# Patient Record
Sex: Male | Born: 1950 | Race: White | Hispanic: No | Marital: Married | Smoking: Never smoker
Health system: Southern US, Community
[De-identification: ages and names within clinical notes are randomized; demographics above are authoritative.]

## PROBLEM LIST (undated history)

## (undated) DIAGNOSIS — H9190 Unspecified hearing loss, unspecified ear: Secondary | ICD-10-CM

## (undated) DIAGNOSIS — I639 Cerebral infarction, unspecified: Secondary | ICD-10-CM

## (undated) DIAGNOSIS — G4730 Sleep apnea, unspecified: Secondary | ICD-10-CM

## (undated) DIAGNOSIS — K219 Gastro-esophageal reflux disease without esophagitis: Secondary | ICD-10-CM

## (undated) DIAGNOSIS — E119 Type 2 diabetes mellitus without complications: Secondary | ICD-10-CM

## (undated) DIAGNOSIS — L570 Actinic keratosis: Secondary | ICD-10-CM

## (undated) DIAGNOSIS — M1990 Unspecified osteoarthritis, unspecified site: Secondary | ICD-10-CM

## (undated) DIAGNOSIS — M1711 Unilateral primary osteoarthritis, right knee: Principal | ICD-10-CM

## (undated) DIAGNOSIS — Z972 Presence of dental prosthetic device (complete) (partial): Secondary | ICD-10-CM

## (undated) DIAGNOSIS — I10 Essential (primary) hypertension: Secondary | ICD-10-CM

## (undated) DIAGNOSIS — E785 Hyperlipidemia, unspecified: Secondary | ICD-10-CM

## (undated) DIAGNOSIS — K589 Irritable bowel syndrome without diarrhea: Secondary | ICD-10-CM

## (undated) DIAGNOSIS — I951 Orthostatic hypotension: Secondary | ICD-10-CM

## (undated) HISTORY — PX: LASIK: SHX215

## (undated) HISTORY — PX: BACK SURGERY: SHX140

## (undated) HISTORY — PX: APPENDECTOMY: SHX54

## (undated) HISTORY — PX: JOINT REPLACEMENT: SHX530

## (undated) HISTORY — PX: TONSILLECTOMY: SUR1361

## (undated) HISTORY — PX: ROTATOR CUFF REPAIR: SHX139

## (undated) HISTORY — DX: Actinic keratosis: L57.0

---

## 2006-05-14 ENCOUNTER — Ambulatory Visit: Payer: Self-pay | Admitting: Internal Medicine

## 2006-06-18 ENCOUNTER — Ambulatory Visit: Payer: Self-pay | Admitting: Internal Medicine

## 2007-10-17 ENCOUNTER — Emergency Department: Payer: Self-pay | Admitting: Emergency Medicine

## 2010-01-20 DIAGNOSIS — C4492 Squamous cell carcinoma of skin, unspecified: Secondary | ICD-10-CM

## 2010-01-20 HISTORY — DX: Squamous cell carcinoma of skin, unspecified: C44.92

## 2012-12-13 ENCOUNTER — Ambulatory Visit: Payer: Self-pay | Admitting: Unknown Physician Specialty

## 2012-12-16 LAB — PATHOLOGY REPORT

## 2013-05-02 ENCOUNTER — Ambulatory Visit: Payer: Self-pay | Admitting: Unknown Physician Specialty

## 2013-05-02 ENCOUNTER — Other Ambulatory Visit: Payer: Self-pay | Admitting: Internal Medicine

## 2013-05-02 LAB — APTT: ACTIVATED PTT: 29.1 s (ref 23.6–35.9)

## 2013-08-15 ENCOUNTER — Ambulatory Visit: Payer: Self-pay | Admitting: Unknown Physician Specialty

## 2013-10-15 DIAGNOSIS — I10 Essential (primary) hypertension: Secondary | ICD-10-CM | POA: Insufficient documentation

## 2014-06-15 DIAGNOSIS — T84019A Broken internal joint prosthesis, unspecified site, initial encounter: Secondary | ICD-10-CM | POA: Insufficient documentation

## 2014-07-08 ENCOUNTER — Other Ambulatory Visit: Payer: Self-pay

## 2014-07-22 ENCOUNTER — Inpatient Hospital Stay: Admission: RE | Admit: 2014-07-22 | Payer: Self-pay | Source: Ambulatory Visit | Admitting: Unknown Physician Specialty

## 2014-07-22 ENCOUNTER — Encounter: Admission: RE | Payer: Self-pay | Source: Ambulatory Visit

## 2014-07-22 SURGERY — TOTAL KNEE ARTHROPLASTY WITH REVISION COMPONENTS
Anesthesia: Choice | Laterality: Left

## 2015-03-04 ENCOUNTER — Other Ambulatory Visit: Payer: Self-pay | Admitting: Unknown Physician Specialty

## 2015-03-04 DIAGNOSIS — H903 Sensorineural hearing loss, bilateral: Secondary | ICD-10-CM

## 2015-03-04 DIAGNOSIS — H905 Unspecified sensorineural hearing loss: Secondary | ICD-10-CM

## 2015-03-24 ENCOUNTER — Ambulatory Visit: Payer: Self-pay

## 2015-03-25 ENCOUNTER — Ambulatory Visit
Admission: RE | Admit: 2015-03-25 | Discharge: 2015-03-25 | Disposition: A | Discharge status: Home / Self Care | Payer: Medicare Other | Source: Ambulatory Visit | Attending: Unknown Physician Specialty | Admitting: Unknown Physician Specialty

## 2015-03-25 DIAGNOSIS — H9193 Unspecified hearing loss, bilateral: Secondary | ICD-10-CM | POA: Diagnosis not present

## 2015-03-25 DIAGNOSIS — H9192 Unspecified hearing loss, left ear: Secondary | ICD-10-CM | POA: Diagnosis present

## 2015-03-25 DIAGNOSIS — H905 Unspecified sensorineural hearing loss: Secondary | ICD-10-CM

## 2015-03-25 DIAGNOSIS — H903 Sensorineural hearing loss, bilateral: Secondary | ICD-10-CM

## 2015-03-25 LAB — POCT I-STAT CREATININE: Creatinine, Ser: 1 mg/dL (ref 0.61–1.24)

## 2015-03-25 MED ORDER — GADOBENATE DIMEGLUMINE 529 MG/ML IV SOLN
20.0000 mL | Freq: Once | INTRAVENOUS | Status: AC | PRN
  Administered 2015-03-25: 20 mL via INTRAVENOUS

## 2016-03-03 ENCOUNTER — Ambulatory Visit
Admission: RE | Admit: 2016-03-03 | Discharge: 2016-03-03 | Disposition: A | Discharge status: Home / Self Care | Payer: Medicare Other | Source: Ambulatory Visit | Attending: Internal Medicine | Admitting: Internal Medicine

## 2016-03-03 ENCOUNTER — Other Ambulatory Visit: Payer: Self-pay | Admitting: Internal Medicine

## 2016-03-03 DIAGNOSIS — R0609 Other forms of dyspnea: Secondary | ICD-10-CM | POA: Insufficient documentation

## 2016-03-03 DIAGNOSIS — N62 Hypertrophy of breast: Secondary | ICD-10-CM | POA: Insufficient documentation

## 2016-03-03 DIAGNOSIS — J9811 Atelectasis: Secondary | ICD-10-CM | POA: Insufficient documentation

## 2016-03-03 DIAGNOSIS — M47814 Spondylosis without myelopathy or radiculopathy, thoracic region: Secondary | ICD-10-CM | POA: Insufficient documentation

## 2016-03-03 DIAGNOSIS — R0600 Dyspnea, unspecified: Secondary | ICD-10-CM

## 2016-03-03 DIAGNOSIS — I2510 Atherosclerotic heart disease of native coronary artery without angina pectoris: Secondary | ICD-10-CM | POA: Insufficient documentation

## 2016-03-03 DIAGNOSIS — K753 Granulomatous hepatitis, not elsewhere classified: Secondary | ICD-10-CM | POA: Diagnosis not present

## 2016-03-03 HISTORY — DX: Type 2 diabetes mellitus without complications: E11.9

## 2016-03-03 LAB — POCT I-STAT CREATININE: Creatinine, Ser: 0.9 mg/dL (ref 0.61–1.24)

## 2016-03-03 MED ORDER — IOPAMIDOL (ISOVUE-370) INJECTION 76%
75.0000 mL | Freq: Once | INTRAVENOUS | Status: AC | PRN
  Administered 2016-03-03: 75 mL via INTRAVENOUS

## 2016-10-30 ENCOUNTER — Observation Stay
Admission: EM | Admit: 2016-10-30 | Discharge: 2016-10-31 | Disposition: A | Discharge status: Home / Self Care | Payer: Medicare Other | Attending: Specialist | Admitting: Specialist

## 2016-10-30 ENCOUNTER — Encounter: Payer: Self-pay | Admitting: Emergency Medicine

## 2016-10-30 DIAGNOSIS — I10 Essential (primary) hypertension: Secondary | ICD-10-CM

## 2016-10-30 DIAGNOSIS — E1169 Type 2 diabetes mellitus with other specified complication: Secondary | ICD-10-CM

## 2016-10-30 DIAGNOSIS — T63461A Toxic effect of venom of wasps, accidental (unintentional), initial encounter: Secondary | ICD-10-CM | POA: Diagnosis not present

## 2016-10-30 DIAGNOSIS — H409 Unspecified glaucoma: Secondary | ICD-10-CM | POA: Diagnosis not present

## 2016-10-30 DIAGNOSIS — E119 Type 2 diabetes mellitus without complications: Secondary | ICD-10-CM | POA: Diagnosis not present

## 2016-10-30 DIAGNOSIS — E785 Hyperlipidemia, unspecified: Secondary | ICD-10-CM | POA: Diagnosis not present

## 2016-10-30 DIAGNOSIS — M79605 Pain in left leg: Secondary | ICD-10-CM | POA: Diagnosis not present

## 2016-10-30 DIAGNOSIS — Z888 Allergy status to other drugs, medicaments and biological substances status: Secondary | ICD-10-CM | POA: Insufficient documentation

## 2016-10-30 DIAGNOSIS — W57XXXA Bitten or stung by nonvenomous insect and other nonvenomous arthropods, initial encounter: Secondary | ICD-10-CM

## 2016-10-30 DIAGNOSIS — M79604 Pain in right leg: Secondary | ICD-10-CM | POA: Insufficient documentation

## 2016-10-30 DIAGNOSIS — Y9289 Other specified places as the place of occurrence of the external cause: Secondary | ICD-10-CM | POA: Diagnosis not present

## 2016-10-30 HISTORY — DX: Essential (primary) hypertension: I10

## 2016-10-30 HISTORY — DX: Hyperlipidemia, unspecified: E78.5

## 2016-10-30 LAB — CBC WITH DIFFERENTIAL/PLATELET
BASOS PCT: 1 %
Basophils Absolute: 0.1 10*3/uL (ref 0–0.1)
EOS ABS: 0 10*3/uL (ref 0–0.7)
EOS PCT: 0 %
HCT: 41.9 % (ref 40.0–52.0)
HEMOGLOBIN: 14.2 g/dL (ref 13.0–18.0)
Lymphocytes Relative: 7 %
Lymphs Abs: 1 10*3/uL (ref 1.0–3.6)
MCH: 29.3 pg (ref 26.0–34.0)
MCHC: 33.9 g/dL (ref 32.0–36.0)
MCV: 86.4 fL (ref 80.0–100.0)
Monocytes Absolute: 0.3 10*3/uL (ref 0.2–1.0)
Monocytes Relative: 2 %
Neutro Abs: 12.4 10*3/uL — ABNORMAL HIGH (ref 1.4–6.5)
Neutrophils Relative %: 90 %
PLATELETS: 240 10*3/uL (ref 150–440)
RBC: 4.85 MIL/uL (ref 4.40–5.90)
RDW: 13.7 % (ref 11.5–14.5)
WBC: 13.7 10*3/uL — AB (ref 3.8–10.6)

## 2016-10-30 LAB — BASIC METABOLIC PANEL
Anion gap: 9 (ref 5–15)
BUN: 15 mg/dL (ref 6–20)
CALCIUM: 9.2 mg/dL (ref 8.9–10.3)
CO2: 22 mmol/L (ref 22–32)
CREATININE: 0.95 mg/dL (ref 0.61–1.24)
Chloride: 105 mmol/L (ref 101–111)
GFR calc non Af Amer: >60 mL/min (ref >60)
Glucose, Bld: 314 mg/dL — ABNORMAL HIGH (ref 65–99)
Potassium: 5 mmol/L (ref 3.5–5.1)
SODIUM: 136 mmol/L (ref 135–145)

## 2016-10-30 MED ORDER — FAMOTIDINE IN NACL 20-0.9 MG/50ML-% IV SOLN
INTRAVENOUS | Status: AC
  Administered 2016-10-30: 20 mg via INTRAVENOUS
  Filled 2016-10-30: qty 50

## 2016-10-30 MED ORDER — HYDROMORPHONE HCL 1 MG/ML IJ SOLN
0.5000 mg | Freq: Once | INTRAMUSCULAR | Status: AC
  Administered 2016-10-30: 0.5 mg via INTRAVENOUS
  Filled 2016-10-30: qty 1

## 2016-10-30 MED ORDER — EPINEPHRINE 0.3 MG/0.3ML IJ SOAJ
INTRAMUSCULAR | Status: AC
  Filled 2016-10-30: qty 0.3

## 2016-10-30 MED ORDER — ONDANSETRON HCL 4 MG/2ML IJ SOLN
INTRAMUSCULAR | Status: AC
  Filled 2016-10-30: qty 2

## 2016-10-30 MED ORDER — DIPHENHYDRAMINE HCL 50 MG/ML IJ SOLN
INTRAMUSCULAR | Status: AC
  Filled 2016-10-30: qty 1

## 2016-10-30 MED ORDER — KETOROLAC TROMETHAMINE 30 MG/ML IJ SOLN
INTRAMUSCULAR | Status: AC
  Administered 2016-10-30: 30 mg
  Filled 2016-10-30: qty 1

## 2016-10-30 MED ORDER — FAMOTIDINE IN NACL 20-0.9 MG/50ML-% IV SOLN
20.0000 mg | Freq: Once | INTRAVENOUS | Status: AC
  Administered 2016-10-30: 20 mg via INTRAVENOUS

## 2016-10-30 MED ORDER — HYDROMORPHONE HCL 1 MG/ML IJ SOLN
0.5000 mg | Freq: Once | INTRAMUSCULAR | Status: AC
  Administered 2016-10-30: 0.5 mg via INTRAVENOUS

## 2016-10-30 MED ORDER — HYDROMORPHONE HCL 1 MG/ML IJ SOLN
1.0000 mg | Freq: Once | INTRAMUSCULAR | Status: AC
  Administered 2016-10-30: 0.5 mg via INTRAVENOUS
  Filled 2016-10-30: qty 1

## 2016-10-30 MED ORDER — DIPHENHYDRAMINE HCL 50 MG/ML IJ SOLN
25.0000 mg | Freq: Once | INTRAMUSCULAR | Status: AC
  Administered 2016-10-30: 25 mg via INTRAVENOUS

## 2016-10-30 MED ORDER — ONDANSETRON HCL 4 MG/2ML IJ SOLN
4.0000 mg | Freq: Once | INTRAMUSCULAR | Status: AC
  Administered 2016-10-30: 4 mg via INTRAVENOUS

## 2016-10-30 MED ORDER — HYDROMORPHONE HCL 1 MG/ML IJ SOLN
INTRAMUSCULAR | Status: AC
  Administered 2016-10-30: 0.5 mg via INTRAVENOUS
  Filled 2016-10-30: qty 1

## 2016-10-30 MED ORDER — METHYLPREDNISOLONE SODIUM SUCC 125 MG IJ SOLR
INTRAMUSCULAR | Status: AC
  Administered 2016-10-30: 125 mg
  Filled 2016-10-30: qty 2

## 2016-10-30 MED ORDER — SODIUM CHLORIDE 0.9 % IV BOLUS (SEPSIS)
1000.0000 mL | Freq: Once | INTRAVENOUS | Status: AC
  Administered 2016-10-30: 1000 mL via INTRAVENOUS

## 2016-10-30 MED ORDER — SODIUM CHLORIDE 0.9 % IV BOLUS (SEPSIS)
1000.0000 mL | Freq: Once | INTRAVENOUS | Status: AC
Start: 2016-10-30 — End: 2016-10-30
  Administered 2016-10-30: 1000 mL via INTRAVENOUS

## 2016-10-30 NOTE — ED Provider Notes (Signed)
[facility] Emergency Department Provider Note ____________________________________________   First MD Initiated Contact with Patient 10/30/16 1949     (approximate)  I have reviewed the triage vital signs and the nursing notes.   HISTORY  Chief Complaint Insect Bite    HPI Vernon Payne is a 66 y.o. male With history of diabetes who presents with diffuse pain and itching after being stung by wasps, approximately 2 hours prior to arrival, when he was mowing the lawn was stung proximally 15 times by yellow jackets.  Patient states the pain is associated with anxiety but states that he has no difficulty breathing, no difficulty swallowing, and does not feel tightness or swelling in his throat, mouth or tongue.  Symptoms slightly improved after being given antihistamine by his wife.    Past Medical History:  Diagnosis Date  . Diabetes mellitus without complication ([place])     There are no active problems to display for this patient.   History reviewed. No pertinent surgical history.  Prior to Admission medications   Not on File    Allergies Morphine and related  History reviewed. No pertinent family history.  Social History Social History  Substance Use Topics  . Smoking status: Never Smoker  . Smokeless tobacco: Never Used  . Alcohol use No    Review of Systems  Constitutional: No fever Eyes: No redness or tearing. ENT: No tightness in the throat.  Cardiovascular: Denies chest pain. Respiratory: Denies shortness of breath. Gastrointestinal: No nausea, no vomiting.    Genitourinary: Negative for dysuria.  Musculoskeletal: Negative for back pain. Skin: Negative for rash, positive for itching.  Neurological: Negative for headaches, focal weakness or numbness.   ____________________________________________   PHYSICAL EXAM:  VITAL SIGNS: ED Triage Vitals  Enc Vitals Group     BP 10/30/16 1951 (!) 187/114     Pulse Rate 10/30/16  1947 (!) 108     Resp 10/30/16 1947 (!) 22     Temp 10/30/16 1947 98.7 F (37.1 C)     Temp Source 10/30/16 1947 Oral     SpO2 10/30/16 1947 98 %     Weight --      Height --      Head Circumference --      Peak Flow --      Pain Score --      Pain Loc --      Pain Edu? --      Excl. in [place]? --     Constitutional: Alert and oriented. Anxious and uncomfortable appearing.  Eyes: Conjunctivae are normal.  Head: Atraumatic. Nose: No congestion/rhinnorhea. Mouth/Throat: Mucous membranes are moist.  Oropharynx clear with no swelling, erythema or pooled secretions.  No stridor.  Neck: Normal range of motion.  Cardiovascular: Normal rate, regular rhythm. Grossly normal heart sounds.  Good peripheral circulation. Respiratory: Normal respiratory effort.  No retractions. Lungs CTAB.  No wheeze.  Gastrointestinal:  No distention.  Genitourinary: No CVA tenderness. Musculoskeletal: No lower extremity edema.  Extremities warm and well perfused.  Neurologic:  Normal speech and language. No gross focal neurologic deficits are appreciated.  Skin:  Skin is warm and dry. No rash noted.  No hives.  Psychiatric: Mood and affect are normal. Speech and behavior are normal.  ____________________________________________   LABS (all labs ordered are listed, but only abnormal results are displayed)  Labs Reviewed  BASIC METABOLIC PANEL  CBC WITH DIFFERENTIAL/PLATELET   ____________________________________________  EKG  ED ECG REPORT I, Arta Silence,  the attending physician, personally viewed and interpreted this ECG.  Date: 10/30/2016 EKG Time: 1950 Rate: 105 Rhythm: sinus tachycardia QRS Axis: left axis deviation Intervals: normal ST/T Wave abnormalities: normal Narrative Interpretation: no evidence of acute ischemia  ____________________________________________  RADIOLOGY    ____________________________________________   PROCEDURES  Procedure(s) performed:  No    Critical Care performed: No ____________________________________________   INITIAL IMPRESSION / ASSESSMENT AND PLAN / ED COURSE  Pertinent labs & imaging results that were available during my care of the patient were reviewed by me and considered in my medical decision making (see chart for details).  66 year old male presents with diffuse body pain, anxiety and itching after being stung by yellow jackets approximately 2 hours ago. Patient is hypertensive and slightly tachycardic on arrival and is tachypneic as well but O2 sat is normal. He is anxious and uncomfortable appearing. Exam otherwise as described. There is no evidence of airway or respiratory involvement or of anaphylactic shock. Likely mild allergic reaction versus symptoms related to acute pain from stings.  No indication for epi.  Will give steroid, benadryl, pepcid, analgesia, fluids and reassess.     ----------------------------------------- 10:16 PM on 10/30/2016 -----------------------------------------  Patient had minimal relief of pain with Toradol so I gave Dilaudid.  patient had significant improvement, but now pain has returned. Patient denies any other acute symptoms except for the pain from the stings which she states is 10 out of 10. Plan for additional analgesia and then reassess. If patient's pain cannot be controlled, may need admission and if pain improved will discharge home.  ----------------------------------------- 11:33 PM on 10/30/2016 -----------------------------------------  Patient with continued pain; will admit for pain control. Signed out to hospitalist Dr. Jannifer Franklin.   ____________________________________________   FINAL CLINICAL IMPRESSION(S) / ED DIAGNOSES  Final diagnoses:  Insect bite, initial encounter  Pain in both lower extremities      NEW MEDICATIONS STARTED DURING THIS VISIT:  New Prescriptions   No medications on file     Note:  This document was prepared using  Dragon voice recognition software and may include unintentional dictation errors.    Arta Silence, MD 10/30/16 2333

## 2016-10-30 NOTE — ED Notes (Signed)
Dr. Siadecki at bedside 

## 2016-10-30 NOTE — ED Notes (Signed)
Pt given coke to drink. Ok per Dr. Cherylann Banas.

## 2016-10-30 NOTE — ED Triage Notes (Signed)
Pt placed in wheelchair from [place] and taken back to room 3. Pt diaphoretic, labored breathing. Pt reports he was stung by approximately 15 yellow jackets 2 hours ago and in the last 30 minutes pt developed pain, sweating and fatigue. MD at bedside.

## 2016-10-31 ENCOUNTER — Encounter: Payer: Self-pay | Admitting: Internal Medicine

## 2016-10-31 DIAGNOSIS — E1169 Type 2 diabetes mellitus with other specified complication: Secondary | ICD-10-CM

## 2016-10-31 DIAGNOSIS — I10 Essential (primary) hypertension: Secondary | ICD-10-CM

## 2016-10-31 DIAGNOSIS — E119 Type 2 diabetes mellitus without complications: Secondary | ICD-10-CM

## 2016-10-31 DIAGNOSIS — T63461A Toxic effect of venom of wasps, accidental (unintentional), initial encounter: Secondary | ICD-10-CM | POA: Diagnosis present

## 2016-10-31 DIAGNOSIS — E785 Hyperlipidemia, unspecified: Secondary | ICD-10-CM

## 2016-10-31 LAB — GLUCOSE, CAPILLARY: Glucose-Capillary: 424 mg/dL — ABNORMAL HIGH (ref 65–99)

## 2016-10-31 MED ORDER — ONDANSETRON HCL 4 MG PO TABS: 4.0000 mg | ORAL_TABLET | Freq: Four times a day (QID) | ORAL | Status: DC | PRN

## 2016-10-31 MED ORDER — PREDNISONE 20 MG PO TABS
20.0000 mg | ORAL_TABLET | Freq: Every day | ORAL | Status: DC
  Administered 2016-10-31: 20 mg via ORAL
  Filled 2016-10-31: qty 1

## 2016-10-31 MED ORDER — ACETAMINOPHEN 325 MG PO TABS: 650.0000 mg | ORAL_TABLET | Freq: Four times a day (QID) | ORAL | Status: DC | PRN

## 2016-10-31 MED ORDER — LISINOPRIL 10 MG PO TABS
10.0000 mg | ORAL_TABLET | Freq: Every day | ORAL | Status: DC
  Administered 2016-10-31: 10 mg via ORAL
  Filled 2016-10-31: qty 1

## 2016-10-31 MED ORDER — DORZOLAMIDE HCL 2 % OP SOLN
1.0000 [drp] | Freq: Two times a day (BID) | OPHTHALMIC | Status: DC
  Administered 2016-10-31 (×2): 1 [drp] via OPHTHALMIC
  Filled 2016-10-31: qty 10

## 2016-10-31 MED ORDER — FENTANYL CITRATE (PF) 100 MCG/2ML IJ SOLN: 50.0000 ug | INTRAMUSCULAR | Status: DC | PRN

## 2016-10-31 MED ORDER — ENOXAPARIN SODIUM 40 MG/0.4ML ~~LOC~~ SOLN
40.0000 mg | SUBCUTANEOUS | Status: DC
  Administered 2016-10-31: 40 mg via SUBCUTANEOUS
  Filled 2016-10-31: qty 0.4

## 2016-10-31 MED ORDER — LORATADINE 10 MG PO TABS
10.0000 mg | ORAL_TABLET | Freq: Every day | ORAL | Status: DC
  Administered 2016-10-31: 10 mg via ORAL
  Filled 2016-10-31 (×2): qty 1

## 2016-10-31 MED ORDER — LATANOPROST 0.005 % OP SOLN
1.0000 [drp] | Freq: Every day | OPHTHALMIC | Status: DC
  Filled 2016-10-31: qty 2.5

## 2016-10-31 MED ORDER — PREDNISONE 20 MG PO TABS: 20.0000 mg | ORAL_TABLET | Freq: Every day | ORAL | 0 refills | Status: AC

## 2016-10-31 MED ORDER — ACETAMINOPHEN 650 MG RE SUPP: 650.0000 mg | Freq: Four times a day (QID) | RECTAL | Status: DC | PRN

## 2016-10-31 MED ORDER — LABETALOL HCL 5 MG/ML IV SOLN
10.0000 mg | INTRAVENOUS | Status: DC | PRN
  Administered 2016-10-31: 10 mg via INTRAVENOUS
  Filled 2016-10-31 (×2): qty 4

## 2016-10-31 MED ORDER — ASPIRIN EC 81 MG PO TBEC
81.0000 mg | DELAYED_RELEASE_TABLET | Freq: Every day | ORAL | Status: DC
  Administered 2016-10-31: 81 mg via ORAL
  Filled 2016-10-31: qty 1

## 2016-10-31 MED ORDER — INSULIN ASPART 100 UNIT/ML ~~LOC~~ SOLN: 0.0000 [IU] | Freq: Every day | SUBCUTANEOUS | Status: DC

## 2016-10-31 MED ORDER — ONDANSETRON HCL 4 MG/2ML IJ SOLN: 4.0000 mg | Freq: Four times a day (QID) | INTRAMUSCULAR | Status: DC | PRN

## 2016-10-31 MED ORDER — INSULIN ASPART 100 UNIT/ML ~~LOC~~ SOLN: 0.0000 [IU] | Freq: Three times a day (TID) | SUBCUTANEOUS | Status: DC

## 2016-10-31 MED ORDER — OXYCODONE HCL 5 MG PO TABS
5.0000 mg | ORAL_TABLET | ORAL | Status: DC | PRN
  Administered 2016-10-31 (×2): 5 mg via ORAL
  Filled 2016-10-31 (×2): qty 1

## 2016-10-31 MED ORDER — INSULIN ASPART 100 UNIT/ML ~~LOC~~ SOLN
12.0000 [IU] | Freq: Once | SUBCUTANEOUS | Status: AC
  Administered 2016-10-31: 12 [IU] via SUBCUTANEOUS
  Filled 2016-10-31: qty 1

## 2016-10-31 NOTE — ED Notes (Addendum)
Floor RN called and stated pt needs medicine for BP, admitting Dr notified, see [facility] for follow up.

## 2016-10-31 NOTE — Discharge Summary (Signed)
[place] at [place] NAME: Vernon Payne    MR#:  [id number]  DATE OF BIRTH:  Dec 23, 1950  DATE OF ADMISSION:  10/30/2016 ADMITTING PHYSICIAN: Lance Coon, MD  DATE OF DISCHARGE: 10/31/2016 11:22 AM  PRIMARY CARE PHYSICIAN: Rusty Aus, MD    ADMISSION DIAGNOSIS:  Pain in both lower extremities [M79.604, M79.605] Insect bite, initial encounter [phone]]  DISCHARGE DIAGNOSIS:  Principal Problem:   Yellow jacket sting Active Problems:   HTN (hypertension)   Diabetes ([place])   HLD (hyperlipidemia)   SECONDARY DIAGNOSIS:   Past Medical History:  Diagnosis Date  . Diabetes mellitus without complication ([place])   . HLD (hyperlipidemia)   . HTN (hypertension)     HOSPITAL COURSE:   66 year old male with past history of diabetes, hypertension, hyperlipidemia who presented to the hospital after multiple bee stings.  1. multiple Yellow jacket stings - patient was observed in the hospital for anaphylaxis. He was treated with IV antihistamines with Benadryl and also oral prednisone. Presently patient has no evidence of anaphylaxis with no rash, he is hemodynamically stable, no shortness of breath with no tongue swelling. He's therefore being discharged on oral prednisone for few more days.  2. Diabetes type 2 without complication-patient's blood sugars were elevated due to prednisone he was taking. - patient will resume his metformin upon discharge  3. Essential hypertension-patient will continue his lisinopril.  4. Glaucoma-patient will continue his dorzolamide, latanoprost eyedrops   DISCHARGE CONDITIONS:   Stable.   CONSULTS OBTAINED:    DRUG ALLERGIES:   Allergies  Allergen Reactions  . Morphine And Related Nausea And Vomiting    DISCHARGE MEDICATIONS:   Allergies as of 10/31/2016      Reactions   Morphine And Related Nausea And Vomiting      Medication List    TAKE these medications   aspirin EC 81 MG tablet Take  81 mg by mouth daily.   dorzolamide 2 % ophthalmic solution Commonly known as:  TRUSOPT Place 1 drop into both eyes 2 (two) times daily.   latanoprost 0.005 % ophthalmic solution Commonly known as:  XALATAN Place 1 drop into both eyes at bedtime.   lisinopril 10 MG tablet Commonly known as:  PRINIVIL,ZESTRIL Take 10 mg by mouth daily.   metFORMIN 500 MG tablet Commonly known as:  GLUCOPHAGE Take 1,500 mg by mouth daily.   predniSONE 20 MG tablet Commonly known as:  DELTASONE Take 1 tablet (20 mg total) by mouth daily with breakfast.            Discharge Care Instructions        Start     Ordered   11/01/16 0000  predniSONE (DELTASONE) 20 MG tablet  Daily with breakfast     10/31/16 1055   10/31/16 0000  Activity as tolerated - No restrictions     10/31/16 1055   10/31/16 0000  Diet - low sodium heart healthy     10/31/16 1055   10/31/16 0000  Diet Carb Modified     10/31/16 1055        DISCHARGE INSTRUCTIONS:   DIET:  Cardiac diet and Diabetic diet  DISCHARGE CONDITION:  Stable  ACTIVITY:  Activity as tolerated  OXYGEN:  Home Oxygen: No.   Oxygen Delivery: room air  DISCHARGE LOCATION:  home   If you experience worsening of your admission symptoms, develop shortness of breath, life threatening emergency, suicidal or homicidal thoughts you must seek medical attention immediately  by calling 911 or calling your MD immediately  if symptoms less severe.  You Must read complete instructions/literature along with all the possible adverse reactions/side effects for all the Medicines you take and that have been prescribed to you. Take any new Medicines after you have completely understood and accpet all the possible adverse reactions/side effects.   Please note  You were cared for by a hospitalist during your hospital stay. If you have any questions about your discharge medications or the care you received while you were in the hospital after you are  discharged, you can call the unit and asked to speak with the hospitalist on call if the hospitalist that took care of you is not available. Once you are discharged, your primary care physician will handle any further medical issues. Please note that NO REFILLS for any discharge medications will be authorized once you are discharged, as it is imperative that you return to your primary care physician (or establish a relationship with a primary care physician if you do not have one) for your aftercare needs so that they can reassess your need for medications and monitor your lab values.     Today   No shortness of breath, Hemodynamically stable.  No rashes, no tongue swelling.   VITAL SIGNS:  Blood pressure (!) 153/97, pulse (!) 101, temperature (!) 97.4 F (36.3 C), temperature source Oral, resp. rate 17, height 6\' 4"  (1.93 m), weight 98.5 kg (217 lb 3.2 oz), SpO2 95 %.  I/O:   Intake/Output Summary (Last 24 hours) at 10/31/16 1548 Last data filed at 10/31/16 0800  Gross per 24 hour  Intake              720 ml  Output                0 ml  Net              720 ml    PHYSICAL EXAMINATION:  GENERAL:  66 y.o.-year-old patient lying in the bed with no acute distress.  EYES: Pupils equal, round, reactive to light and accommodation. No scleral icterus. Extraocular muscles intact.  HEENT: Head atraumatic, normocephalic. Oropharynx and nasopharynx clear.  NECK:  Supple, no jugular venous distention. No thyroid enlargement, no tenderness.  LUNGS: Normal breath sounds bilaterally, no wheezing, rales,rhonchi. No use of accessory muscles of respiration.  CARDIOVASCULAR: S1, S2 normal. No murmurs, rubs, or gallops.  ABDOMEN: Soft, non-tender, non-distended. Bowel sounds present. No organomegaly or mass.  EXTREMITIES: No pedal edema, cyanosis, or clubbing.  NEUROLOGIC: Cranial nerves II through XII are intact. No focal motor or sensory defecits b/l.  PSYCHIATRIC: The patient is alert and oriented x  3. Good affect.  SKIN: No obvious rash, lesion, or ulcer.   DATA REVIEW:   CBC  Recent Labs Lab 10/30/16 2321  WBC 13.7*  HGB 14.2  HCT 41.9  PLT 240    Chemistries   Recent Labs Lab 10/30/16 2321  NA 136  K 5.0  CL 105  CO2 22  GLUCOSE 314*  BUN 15  CREATININE 0.95  CALCIUM 9.2    Cardiac Enzymes No results for input(s): TROPONINI in the last 168 hours.  Microbiology Results  No results found for this or any previous visit.  RADIOLOGY:  No results found.    Management plans discussed with the patient, family and they are in agreement.  CODE STATUS:  Code Status History    Date Active Date Inactive Code Status Order ID Comments  User Context   10/31/2016  2:27 AM 10/31/2016  2:22 PM Full Code [id number]  Lance Coon, MD ED    Advance Directive Documentation     Most Recent Value  Type of Advance Directive  Living will, Healthcare Power of Attorney  Pre-existing out of facility DNR order (yellow form or pink MOST form)  -  "MOST" Form in Place?  -      TOTAL TIME TAKING CARE OF THIS PATIENT: 40 minutes.    Henreitta Leber M.D on 10/31/2016 at 3:48 PM  Between 7am to 6pm - Pager - [phone]  After 6pm go to [email or web address] - Proofreader  [facility]  Office  [phone]  CC: Primary care physician; Rusty Aus, MD

## 2016-10-31 NOTE — Progress Notes (Signed)
BS is 424.  Sainini advised to give patient 12units novolog.

## 2016-10-31 NOTE — Progress Notes (Signed)
Inpatient Diabetes Program Recommendations  AACE/ADA: New Consensus Statement on Inpatient Glycemic Control (2015)  Target Ranges:  Prepandial:   less than 140 mg/dL      Peak postprandial:   less than 180 mg/dL (1-2 hours)      Critically ill patients:  140 - 180 mg/dL   Lab Results  Component Value Date   GLUCAP 424 (H) 10/31/2016    Review of Glycemic ControlResults for JAMIAH, RECORE (MRN [id number]) as of 10/31/2016 10:31  Ref. Range 10/31/2016 07:30  Glucose-Capillary Latest Ref Range: 65 - 99 mg/dL 424 (H)    Diabetes history: Type 2 diabetes- A1C in march 2018 was 7.0%,  Outpatient Diabetes medications: Metformin 1500 mg daily Current orders for Inpatient glycemic control:  Novolog sensitive tid with meals and HS, Prednisone 20 mg daily with breakfast  Inpatient Diabetes Program Recommendations:    Please consider checking A1C to determine pre-hospitalization glycemic control. Also consider increasing Novolog correction to resistant tid with meals and HS while on Prednisone. Blood sugars likely increased from steroids.   Thanks, Adah Perl, RN, BC-ADM Inpatient Diabetes Coordinator Pager [phone] (8a-5p)

## 2016-10-31 NOTE — H&P (Signed)
[place] at [place] NAME: Vernon Payne    MR#:  [id number]  DATE OF BIRTH:  1950-06-25  DATE OF ADMISSION:  10/30/2016  PRIMARY CARE PHYSICIAN: Rusty Aus, MD   REQUESTING/REFERRING PHYSICIAN: Cherylann Banas, MD  CHIEF COMPLAINT:   Chief Complaint  Patient presents with  . Insect Bite    HISTORY OF PRESENT ILLNESS:  Vernon Payne  is a 66 y.o. male who presents with multiple stings, likely from yellow jackets. Patient states that he has 15 or more stings from yellow jackets all over his body. He states that the pain is significantly intolerable. He was given multiple doses of narcotic pain medicines in the ED with incomplete relief of his pain. Hospitalists were called for admission and further treatment  PAST MEDICAL HISTORY:   Past Medical History:  Diagnosis Date  . Diabetes mellitus without complication ([place])   . HLD (hyperlipidemia)   . HTN (hypertension)     PAST SURGICAL HISTORY:   Past Surgical History:  Procedure Laterality Date  . APPENDECTOMY    . TONSILLECTOMY      SOCIAL HISTORY:   Social History  Substance Use Topics  . Smoking status: Never Smoker  . Smokeless tobacco: Never Used  . Alcohol use No    FAMILY HISTORY:   Family History  Problem Relation Age of Onset  . Diabetes Father     DRUG ALLERGIES:   Allergies  Allergen Reactions  . Morphine And Related Nausea And Vomiting    MEDICATIONS AT HOME:   Prior to Admission medications   Medication Sig Start Date End Date Taking? Authorizing Provider  aspirin EC 81 MG tablet Take 81 mg by mouth daily.   Yes [provider]  dorzolamide (TRUSOPT) 2 % ophthalmic solution Place 1 drop into both eyes 2 (two) times daily.   Yes [provider]  latanoprost (XALATAN) 0.005 % ophthalmic solution Place 1 drop into both eyes at bedtime.   Yes [provider]  lisinopril (PRINIVIL,ZESTRIL) 10 MG tablet Take 10 mg by mouth  daily.   Yes [provider]  metFORMIN (GLUCOPHAGE) 500 MG tablet Take 1,500 mg by mouth daily.   Yes [provider]    REVIEW OF SYSTEMS:  Review of Systems  Constitutional: Negative for chills, fever, malaise/fatigue and weight loss.  HENT: Negative for ear pain, hearing loss and tinnitus.   Eyes: Negative for blurred vision, double vision, pain and redness.  Respiratory: Negative for cough, hemoptysis and shortness of breath.   Cardiovascular: Negative for chest pain, palpitations, orthopnea and leg swelling.  Gastrointestinal: Negative for abdominal pain, constipation, diarrhea, nausea and vomiting.  Genitourinary: Negative for dysuria, frequency and hematuria.  Musculoskeletal: Negative for back pain, joint pain and neck pain.  Skin:       Multiple insect stings  Neurological: Negative for dizziness, tremors, focal weakness and weakness.  Endo/Heme/Allergies: Negative for polydipsia. Does not bruise/bleed easily.  Psychiatric/Behavioral: Negative for depression. The patient is not nervous/anxious and does not have insomnia.      VITAL SIGNS:   Vitals:   10/30/16 2100 10/30/16 2130 10/30/16 2200 10/30/16 2230  BP: (!) 196/121 (!) 195/115 (!) 188/112 (!) 188/107  Pulse: 92 94 (!) 101 (!) 103  Resp: 13 18 (!) 34 17  Temp:      TempSrc:      SpO2: 92% 93% 96% 91%   Wt Readings from Last 3 Encounters:  No data found for [employer]  PHYSICAL EXAMINATION:  Physical Exam  Vitals reviewed. Constitutional: He is oriented to person, place, and time. He appears well-developed and well-nourished. No distress.  HENT:  Head: Normocephalic and atraumatic.  Mouth/Throat: Oropharynx is clear and moist.  Eyes: Pupils are equal, round, and reactive to light. Conjunctivae and EOM are normal. No scleral icterus.  Neck: Normal range of motion. Neck supple. No JVD present. No thyromegaly present.  Cardiovascular: Normal rate, regular rhythm and intact distal pulses.  Exam  reveals no gallop and no friction rub.   No murmur heard. Respiratory: Effort normal and breath sounds normal. No respiratory distress. He has no wheezes. He has no rales.  GI: Soft. Bowel sounds are normal. He exhibits no distension. There is no tenderness.  Musculoskeletal: Normal range of motion. He exhibits no edema.  No arthritis, no gout  Lymphadenopathy:    He has no cervical adenopathy.  Neurological: He is alert and oriented to person, place, and time. No cranial nerve deficit.  No dysarthria, no aphasia  Skin: Skin is warm and dry. No rash noted. No erythema.  Multiple insect stings on his torso, head, and extremities  Psychiatric: He has a normal mood and affect. His behavior is normal. Judgment and thought content normal.    LABORATORY PANEL:   CBC  Recent Labs Lab 10/30/16 2321  WBC 13.7*  HGB 14.2  HCT 41.9  PLT 240   ------------------------------------------------------------------------------------------------------------------  Chemistries   Recent Labs Lab 10/30/16 2321  NA 136  K 5.0  CL 105  CO2 22  GLUCOSE 314*  BUN 15  CREATININE 0.95  CALCIUM 9.2   ------------------------------------------------------------------------------------------------------------------  Cardiac Enzymes No results for input(s): TROPONINI in the last 168 hours. ------------------------------------------------------------------------------------------------------------------  RADIOLOGY:  No results found.  EKG:   Orders placed or performed during the hospital encounter of 10/30/16  . EKG 12-Lead  . EKG 12-Lead  . EKG 12-Lead  . EKG 12-Lead  . EKG 12-Lead  . EKG 12-Lead    IMPRESSION AND PLAN:  Principal Problem:   Yellow jacket sting - when necessary analgesia, by mouth prednisone, antihistamine Active Problems:   HTN (hypertension) - stable, continue home meds   Diabetes ([place]) - sliding scale insulin with corresponding glucose checks  All the records  are reviewed and case discussed with ED provider. Management plans discussed with the patient and/or family.  DVT PROPHYLAXIS: SubQ lovenox  GI PROPHYLAXIS: None  ADMISSION STATUS: Observation  CODE STATUS: Full Code Status History    This patient does not have a recorded code status. Please follow your organizational policy for patients in this situation.      TOTAL TIME TAKING CARE OF THIS PATIENT: 40 minutes.   Ave [place] 10/31/2016, 1:00 AM  [facility]  Office  [phone]  CC: Primary care physician; Rusty Aus, MD  Note:  This document was prepared using Dragon voice recognition software and may include unintentional dictation errors.

## 2016-10-31 NOTE — Progress Notes (Signed)
Patient is alert and oriented and able to verbalize needs. No complaints of pain at this time. Vital signs stable. PIV removed. Discharge instructions gone over with patient and wife. Printed AVS and rx for Prednisone given to patient at this time. All belongings packed up. Volunteer services called to escort patient to car via wc. Wife to transport patient home.   Bethann Punches, RN

## 2016-10-31 NOTE — Care Management Obs Status (Signed)
[facility] NOTIFICATION   Patient Details  Name: Vernon Payne MRN: [id number] Date of Birth: [birth year]   Medicare Observation Status Notification Given:  Yes    Jolly Mango, RN 10/31/2016, 9:55 AM

## 2017-01-24 DIAGNOSIS — G4733 Obstructive sleep apnea (adult) (pediatric): Secondary | ICD-10-CM | POA: Insufficient documentation

## 2017-07-18 ENCOUNTER — Encounter: Payer: Self-pay | Admitting: Student in an Organized Health Care Education/Training Program

## 2017-07-18 ENCOUNTER — Ambulatory Visit (HOSPITAL_BASED_OUTPATIENT_CLINIC_OR_DEPARTMENT_OTHER): Payer: Medicare Other | Admitting: Student in an Organized Health Care Education/Training Program

## 2017-07-18 ENCOUNTER — Ambulatory Visit
Admission: RE | Admit: 2017-07-18 | Discharge: 2017-07-18 | Disposition: A | Discharge status: Home / Self Care | Payer: Medicare Other | Source: Ambulatory Visit | Attending: Student in an Organized Health Care Education/Training Program | Admitting: Student in an Organized Health Care Education/Training Program

## 2017-07-18 VITALS — BP 144/82 | HR 83 | Temp 98.1°F | Resp 12 | Ht 75.0 in | Wt 213.0 lb

## 2017-07-18 DIAGNOSIS — Z96653 Presence of artificial knee joint, bilateral: Secondary | ICD-10-CM | POA: Diagnosis not present

## 2017-07-18 DIAGNOSIS — G894 Chronic pain syndrome: Secondary | ICD-10-CM | POA: Insufficient documentation

## 2017-07-18 DIAGNOSIS — M1712 Unilateral primary osteoarthritis, left knee: Secondary | ICD-10-CM

## 2017-07-18 DIAGNOSIS — Z981 Arthrodesis status: Secondary | ICD-10-CM | POA: Insufficient documentation

## 2017-07-18 DIAGNOSIS — Z885 Allergy status to narcotic agent status: Secondary | ICD-10-CM | POA: Insufficient documentation

## 2017-07-18 DIAGNOSIS — M549 Dorsalgia, unspecified: Secondary | ICD-10-CM | POA: Insufficient documentation

## 2017-07-18 DIAGNOSIS — Z96652 Presence of left artificial knee joint: Secondary | ICD-10-CM

## 2017-07-18 DIAGNOSIS — M170 Bilateral primary osteoarthritis of knee: Secondary | ICD-10-CM | POA: Diagnosis not present

## 2017-07-18 DIAGNOSIS — M1711 Unilateral primary osteoarthritis, right knee: Secondary | ICD-10-CM

## 2017-07-18 DIAGNOSIS — M25569 Pain in unspecified knee: Secondary | ICD-10-CM | POA: Diagnosis present

## 2017-07-18 MED ORDER — LACTATED RINGERS IV SOLN
1000.0000 mL | Freq: Once | INTRAVENOUS | Status: AC
  Administered 2017-07-18: 1000 mL via INTRAVENOUS

## 2017-07-18 MED ORDER — MIDAZOLAM HCL 5 MG/5ML IJ SOLN
1.0000 mg | INTRAMUSCULAR | Status: DC | PRN
  Administered 2017-07-18: 1.5 mg via INTRAVENOUS

## 2017-07-18 MED ORDER — MIDAZOLAM HCL 5 MG/5ML IJ SOLN
INTRAMUSCULAR | Status: AC
  Filled 2017-07-18: qty 5

## 2017-07-18 MED ORDER — FENTANYL CITRATE (PF) 100 MCG/2ML IJ SOLN
25.0000 ug | INTRAMUSCULAR | Status: DC | PRN
  Filled 2017-07-18: qty 2

## 2017-07-18 MED ORDER — ROPIVACAINE HCL 2 MG/ML IJ SOLN
10.0000 mL | Freq: Once | INTRAMUSCULAR | Status: AC
  Administered 2017-07-18: 10 mL
  Filled 2017-07-18: qty 10

## 2017-07-18 MED ORDER — DEXAMETHASONE SODIUM PHOSPHATE 10 MG/ML IJ SOLN
10.0000 mg | Freq: Once | INTRAMUSCULAR | Status: AC
  Administered 2017-07-18: 10 mg
  Filled 2017-07-18: qty 1

## 2017-07-18 MED ORDER — DEXAMETHASONE SODIUM PHOSPHATE 10 MG/ML IJ SOLN
INTRAMUSCULAR | Status: AC
  Filled 2017-07-18: qty 1

## 2017-07-18 MED ORDER — DEXAMETHASONE SODIUM PHOSPHATE 10 MG/ML IJ SOLN: 10.0000 mg | Freq: Once | INTRAMUSCULAR | Status: DC

## 2017-07-18 MED ORDER — LIDOCAINE HCL 1 % IJ SOLN
10.0000 mL | Freq: Once | INTRAMUSCULAR | Status: AC
  Administered 2017-07-18: 10 mL
  Filled 2017-07-18: qty 10

## 2017-07-18 NOTE — Progress Notes (Signed)
Patient's Name: Vernon Payne  MRN: [id number]  Referring Provider: Rusty Aus, MD  DOB: 1951/02/01  PCP: Rusty Aus, MD  DOS: 07/18/2017  Note by: Gillis Santa, MD  Service setting: Ambulatory outpatient  Specialty: Interventional Pain Management  Patient type: Established  Location: [facility] (AMB) Pain Management Facility  Visit type: Interventional Procedure   Primary Reason for Visit: Interventional Pain Management Treatment. CC: Knee Pain (bilateral partial knee replacement on the left) and Back Pain (lower L4,5 s/p fusion with bone graft )  Procedure:  Anesthesia, Analgesia, Anxiolysis:  Type: Genicular Nerves Block (Superior-lateral, Superior-medial, and Inferior-medial Genicular Nerves) #1  CPT: 99371      Primary Purpose: Diagnostic Region: Lateral, Anterior, and Medial aspects of the knee joint, above and below the knee joint proper. Level: Superior and inferior to the knee joint. Target Area: For Genicular Nerve block(s), the targets are: the superior-lateral genicular nerve, located in the lateral distal portion of the femoral shaft as it curves to form the lateral epicondyle, in the region of the distal femoral metaphysis; the superior-medial genicular nerve, located in the medial distal portion of the femoral shaft as it curves to form the medial epicondyle; and the inferior-medial genicular nerve, located in the medial, proximal portion of the tibial shaft, as it curves to form the medial epicondyle, in the region of the proximal tibial metaphysis. Approach: Anterior, percutaneous, ipsilateral approach. Laterality: Bilateral Position: Modified Fowler's position with pillows under the targeted knee(s).  Type: Moderate (Conscious) Sedation combined with Local Anesthesia Indication(s): Analgesia and Anxiety Route: Intravenous (IV) IV Access: Secured Sedation: Meaningful verbal contact was maintained at all times during the procedure  Local Anesthetic: Lidocaine 1-2%    Indications: 1. Primary osteoarthritis of right knee   2. Primary osteoarthritis of left knee   3. Status post left partial knee replacement   4. Chronic pain syndrome    Pain Score: Pre-procedure: 4 /10 Post-procedure: 2 /10  Pre-op Assessment:  Vernon Payne is a 67 y.o. (year old), male patient, seen today for interventional treatment. He  has a past surgical history that includes Appendectomy and Tonsillectomy. Vernon Payne has a current medication list which includes the following prescription(s): amlodipine, aspirin ec, dorzolamide, glimepiride, latanoprost, lisinopril, metformin, multi-vitamins, and omeprazole, and the following Facility-Administered Medications: dexamethasone, fentanyl, and midazolam. His primarily concern today is the Knee Pain (bilateral partial knee replacement on the left) and Back Pain (lower L4,5 s/p fusion with bone graft )  Initial Vital Signs:  Pulse/HCG Rate: 89  Temp: 98.5 F (36.9 C) Resp: 16 BP: 113/73 SpO2: 97 %  BMI: Estimated body mass index is 26.62 kg/m as calculated from the following:   Height as of this encounter: 6\' 3"  (1.905 m).   Weight as of this encounter: 213 lb (96.6 kg).  Risk Assessment: Allergies: Reviewed. He is allergic to morphine and related.  Allergy Precautions: None required Coagulopathies: Reviewed. None identified.  Blood-thinner therapy: None at this time Active Infection(s): Reviewed. None identified. Vernon Payne is afebrile  Site Confirmation: Vernon Payne was asked to confirm the procedure and laterality before marking the site Procedure checklist: Completed Consent: Before the procedure and under the influence of no sedative(s), amnesic(s), or anxiolytics, the patient was informed of the treatment options, risks and possible complications. To fulfill our ethical and legal obligations, as recommended by the American Medical Association's Code of Ethics, I have informed the patient of my clinical impression; the nature and  purpose of the treatment or procedure; the risks,  benefits, and possible complications of the intervention; the alternatives, including doing nothing; the risk(s) and benefit(s) of the alternative treatment(s) or procedure(s); and the risk(s) and benefit(s) of doing nothing. The patient was provided information about the general risks and possible complications associated with the procedure. These may include, but are not limited to: failure to achieve desired goals, infection, bleeding, organ or nerve damage, allergic reactions, paralysis, and death. In addition, the patient was informed of those risks and complications associated to the procedure, such as failure to decrease pain; infection; bleeding; organ or nerve damage with subsequent damage to sensory, motor, and/or autonomic systems, resulting in permanent pain, numbness, and/or weakness of one or several areas of the body; allergic reactions; (i.e.: anaphylactic reaction); and/or death. Furthermore, the patient was informed of those risks and complications associated with the medications. These include, but are not limited to: allergic reactions (i.e.: anaphylactic or anaphylactoid reaction(s)); adrenal axis suppression; blood sugar elevation that in diabetics may result in ketoacidosis or comma; water retention that in patients with history of congestive heart failure may result in shortness of breath, pulmonary edema, and decompensation with resultant heart failure; weight gain; swelling or edema; medication-induced neural toxicity; particulate matter embolism and blood vessel occlusion with resultant organ, and/or nervous system infarction; and/or aseptic necrosis of one or more joints. Finally, the patient was informed that Medicine is not an exact science; therefore, there is also the possibility of unforeseen or unpredictable risks and/or possible complications that may result in a catastrophic outcome. The patient indicated having understood very  clearly. We have given the patient no guarantees and we have made no promises. Enough time was given to the patient to ask questions, all of which were answered to the patient's satisfaction. Vernon Payne has indicated that he wanted to continue with the procedure. Attestation: I, the ordering provider, attest that I have discussed with the patient the benefits, risks, side-effects, alternatives, likelihood of achieving goals, and potential problems during recovery for the procedure that I have provided informed consent. Date  Time: 07/18/2017  9:35 AM  Pre-Procedure Preparation:  Monitoring: As per clinic protocol. Respiration, ETCO2, SpO2, BP, heart rate and rhythm monitor placed and checked for adequate function Safety Precautions: Patient was assessed for positional comfort and pressure points before starting the procedure. Time-out: I initiated and conducted the "Time-out" before starting the procedure, as per protocol. The patient was asked to participate by confirming the accuracy of the "Time Out" information. Verification of the correct person, site, and procedure were performed and confirmed by me, the nursing staff, and the patient. "Time-out" conducted as per Joint Commission's Universal Protocol (UP.01.01.01). Time: 1032  Description of Procedure Process:  Area Prepped: Entire knee area, from mid-thigh to mid-shin, lateral, anterior, and medial aspects. Prepping solution: ChloraPrep (2% chlorhexidine gluconate and 70% isopropyl alcohol) Safety Precautions: Aspiration looking for blood return was conducted prior to all injections. At no point did we inject any substances, as a needle was being advanced. No attempts were made at seeking any paresthesias. Safe injection practices and needle disposal techniques used. Medications properly checked for expiration dates. SDV (single dose vial) medications used. Latex Allergy precautions taken.   Description of the Procedure: Protocol guidelines were  followed. The patient was placed in position over the procedure table. The target area was identified and the area prepped in the usual manner. Skin desensitized using vapocoolant spray. Skin & deeper tissues infiltrated with local anesthetic. Appropriate amount of time allowed to pass for local anesthetics to  take effect. The procedure needles were then advanced to the target area. Proper needle placement secured. Negative aspiration confirmed. Solution injected in intermittent fashion, asking for systemic symptoms every 0.5cc of injectate. The needles were then removed and the area cleansed, making sure to leave some of the prepping solution back to take advantage of its long term bactericidal properties.  Vitals:   07/18/17 1056 07/18/17 1105 07/18/17 1115 07/18/17 1125  BP: (!) 142/92 134/84 (!) 148/83 (!) 144/82  Pulse: 80 80 79 83  Resp: 13 11 16 12   Temp:    98.1 F (36.7 C)  TempSrc:    Temporal  SpO2: 98% 98% 97% 97%  Weight:      Height:        Start Time: 1035 hrs. End Time: 1046 hrs. Materials:  Needle(s) Type: Regular needle Gauge: 22G Length: 3.5-in Medication(s): Please see orders for medications and dosing details. 9 cc solution made of 8 cc of 0.2% ropivacaine, 1 cc of Decadron, 10 mg/cc.  1.5 cc injected at each level above, bilaterally. Imaging Guidance (Non-Spinal):  Type of Imaging Technique: Fluoroscopy Guidance (Non-Spinal) Indication(s): Assistance in needle guidance and placement for procedures requiring needle placement in or near specific anatomical locations not easily accessible without such assistance. Exposure Time: Please see nurses notes. Contrast: Before injecting any contrast, we confirmed that the patient did not have an allergy to iodine, shellfish, or radiological contrast. Once satisfactory needle placement was completed at the desired level, radiological contrast was injected. Contrast injected under live fluoroscopy. No contrast complications. See  chart for type and volume of contrast used. Fluoroscopic Guidance: I was personally present during the use of fluoroscopy. "Tunnel Vision Technique" used to obtain the best possible view of the target area. Parallax error corrected before commencing the procedure. "Direction-depth-direction" technique used to introduce the needle under continuous pulsed fluoroscopy. Once target was reached, antero-posterior, oblique, and lateral fluoroscopic projection used confirm needle placement in all planes. Images permanently stored in EMR. Interpretation: I personally interpreted the imaging intraoperatively. Adequate needle placement confirmed in multiple planes. Appropriate spread of contrast into desired area was observed. No evidence of afferent or efferent intravascular uptake. Permanent images saved into the patient's record.  Antibiotic Prophylaxis:   Anti-infectives (From admission, onward)   None     Indication(s): None identified  Post-operative Assessment:  Post-procedure Vital Signs:  Pulse/HCG Rate: 83  Temp: 98.1 F (36.7 C) Resp: 12 BP: (!) 144/82 SpO2: 97 %  EBL: None  Complications: No immediate post-treatment complications observed by team, or reported by patient.  Note: The patient tolerated the entire procedure well. A repeat set of vitals were taken after the procedure and the patient was kept under observation following institutional policy, for this type of procedure. Post-procedural neurological assessment was performed, showing return to baseline, prior to discharge. The patient was provided with post-procedure discharge instructions, including a section on how to identify potential problems. Should any problems arise concerning this procedure, the patient was given instructions to immediately contact us, at any time, without hesitation. In any case, we plan to contact the patient by telephone for a follow-up status report regarding this interventional procedure.  Comments:   No additional relevant information. 5 out of 5 strength bilateral lower extremity: Plantar flexion, dorsiflexion, knee flexion, knee extension.  Plan of Care   Imaging Orders     DG C-Arm 1-60 Min-No Report Procedure Orders    No procedure(s) ordered today    Medications ordered for procedure: Meds ordered  this encounter  Medications  . lactated ringers infusion 1,000 mL  . fentaNYL (SUBLIMAZE) injection 25-100 mcg    Make sure Narcan is available in the pyxis when using this medication. In the event of respiratory depression (RR< 8/min): Titrate NARCAN (naloxone) in increments of 0.1 to 0.2 mg IV at 2-3 minute intervals, until desired degree of reversal.  . lidocaine (XYLOCAINE) 1 % (with pres) injection 10 mL  . ropivacaine (PF) 2 mg/mL (0.2%) (NAROPIN) injection 10 mL  . dexamethasone (DECADRON) injection 10 mg  . midazolam (VERSED) 5 MG/5ML injection 1-2 mg    Make sure Flumazenil is available in the pyxis when using this medication. If oversedation occurs, administer 0.2 mg IV over 15 sec. If after 45 sec no response, administer 0.2 mg again over 1 min; may repeat at 1 min intervals; not to exceed 4 doses (1 mg)  . dexamethasone (DECADRON) injection 10 mg   Medications administered: We administered lactated ringers, lidocaine, ropivacaine (PF) 2 mg/mL (0.2%), and dexamethasone.  See the medical record for exact dosing, route, and time of administration.  New Prescriptions   No medications on file   Disposition: Discharge home  Discharge Date & Time: 07/18/2017; 1131 hrs.   Physician-requested Follow-up: Return in about 6 weeks (around 08/29/2017) for Post Procedure Evaluation.  Future Appointments  Date Time Provider [place]  09/04/2017  9:30 AM Gillis Santa, MD [facility] None   Primary Care Physician: Rusty Aus, MD Location: [facility] Note by: Gillis Santa, MD Date: 07/18/2017; Time: 12:37 PM  Disclaimer:  Medicine is not  an exact science. The only guarantee in medicine is that nothing is guaranteed. It is important to note that the decision to proceed with this intervention was based on the information collected from the patient. The Data and conclusions were drawn from the patient's questionnaire, the interview, and the physical examination. Because the information was provided in large part by the patient, it cannot be guaranteed that it has not been purposely or unconsciously manipulated. Every effort has been made to obtain as much relevant data as possible for this evaluation. It is important to note that the conclusions that lead to this procedure are derived in large part from the available data. Always take into account that the treatment will also be dependent on availability of resources and existing treatment guidelines, considered by other Pain Management Practitioners as being common knowledge and practice, at the time of the intervention. For Medico-Legal purposes, it is also important to point out that variation in procedural techniques and pharmacological choices are the acceptable norm. The indications, contraindications, technique, and results of the above procedure should only be interpreted and judged by a Board-Certified Interventional Pain Specialist with extensive familiarity and expertise in the same exact procedure and technique.

## 2017-07-18 NOTE — Progress Notes (Signed)
Safety precautions to be maintained throughout the outpatient stay will include: orient to surroundings, keep bed in low position, maintain call bell within reach at all times, provide assistance with transfer out of bed and ambulation.  

## 2017-07-18 NOTE — Progress Notes (Signed)
Patient's Name: Vernon Payne  MRN: [id number]  Referring Provider: Rusty Aus, MD  DOB: 18-Apr-1950  PCP: Rusty Aus, MD  DOS: 07/18/2017  Note by: Gillis Santa, MD  Service setting: Ambulatory outpatient  Specialty: Interventional Pain Management  Location: [facility] (AMB) Pain Management Facility    Patient type: New patient ("FAST-TRACK" Evaluation)   Warning: This referral option does not include the extensive pharmacological evaluation required for [place] to take over the patient's medication management. The "Fast-Track" system is designed to bypass the new patient referral waiting list, as well as the normal patient evaluation process, in order to provide a patient in distress with a timely pain management intervention. Because the system was not designed to unfairly get a patient into our pain practice ahead of those already waiting, certain restrictions apply. By requesting a "Fast-Track" consult, the referring physician has opted to continue managing the patient's medications in order to get interventional urgent care.  Primary Reason for Visit: Interventional Pain Management Treatment. 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 is a 67 y.o. year old, male patient, who comes today for a  "Fast-Track" new patient evaluation, as requested by Rusty Aus, MD. The patient has been made aware that this type of referral option is reserved for the Interventional Pain Management portion of our practice and completely excludes the option of medication management. 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                         When using our objective Pain Scale, levels between 6 and 10/10 are said to belong in an emergency room, as it progressively worsens from a 6/10, described as severely limiting, requiring emergency care not usually available at an outpatient pain management facility. At a 6/10 level, communication becomes difficult and requires great effort. Assistance to reach the emergency department may be required. Facial flushing and profuse sweating along with potentially dangerous increases in heart rate and blood pressure will be evident. Effect on ADL: sleep disruption.  Timing: Constant Modifying factors: steroid shot from last week helped for approx 1 day.   BP: (!) 144/82  HR: 83  Onset and Duration: Gradual and Date of onset: 10 years Cause of pain: Unknown Severity: Getting worse and NAS-11 now: 4/10 Timing: 24/7 Aggravating Factors: Kneeling Alleviating Factors: Sleeping Associated Problems: Fatigue, Swelling and Weakness Quality of Pain: Annoying and Constant Previous Examinations or Tests: X-rays Previous Treatments: The patient denies cortisone last week, minimal relief  The patient comes into the clinics today, referred to [place] for a bilateral genicular nerve block.  In brief, patient has a history of a left partial knee replacement as well as right knee osteoarthritis with significant right knee pain.  Patient is referred from Dr. Candelaria Stagers.  No noted contraindications to genicular nerve block.  We will proceed with nerve block today.  Meds   Current Outpatient Medications:  .  amLODipine (NORVASC) 5 MG tablet, Take 5 mg by mouth at bedtime as  needed., Disp: , Rfl:  .  aspirin EC 81 MG tablet, Take 81 mg by mouth daily., Disp: , Rfl:  .  dorzolamide (TRUSOPT) 2 % ophthalmic solution, Place 1 drop into both eyes 2 (two) times daily., Disp: , Rfl:  .  glimepiride (AMARYL) 2 MG  tablet, Take 2 mg by mouth daily., Disp: , Rfl:  .  latanoprost (XALATAN) 0.005 % ophthalmic solution, Place 1 drop into both eyes at bedtime., Disp: , Rfl:  .  lisinopril (PRINIVIL,ZESTRIL) 10 MG tablet, Take 10 mg by mouth daily., Disp: , Rfl:  .  metFORMIN (GLUCOPHAGE) 500 MG tablet, Take 2,000 mg by mouth daily. , Disp: , Rfl:  .  Multiple Vitamin (MULTI-VITAMINS) TABS, Take 1 tablet by mouth daily., Disp: , Rfl:  .  omeprazole (PRILOSEC OTC) 20 MG tablet, Take 20 mg by mouth daily., Disp: , Rfl:   Current Facility-Administered Medications:  .  dexamethasone (DECADRON) injection 10 mg, 10 mg, Other, Once, Velda Wendt, MD .  fentaNYL (SUBLIMAZE) injection 25-100 mcg, 25-100 mcg, Intravenous, Q5 min PRN, Holley Raring, Yoshiharu Brassell, MD .  midazolam (VERSED) 5 MG/5ML injection 1-2 mg, 1-2 mg, Intravenous, Q5 min PRN, Gillis Santa, MD  Imaging Review  Left Knee Radiographs - 3 views (Bilateral AP, Lateral, Bilateral Sunrise)  performed 03/15/2017  CLINICAL INFORMATION: Chronic left knee pain s/p partial knee replacement  COMPARISON: Left knee x-rays 2 views (AP, lateral) performed 01/13/2014  FINDINGS: Prior partial joint replacement hardware is intact without evidence of  loosening.There is lateral compartment arthritis with osteophyte  formation.There is patellofemoral arthritis with osteophyte formation. There is enthesopathic change the quadriceps attachment.No fractures or  dislocations.No soft tissue swelling or joint effusion. No loose bodies.   No abnormal bone lesions.  EXAM: Right Knee Radiographs - 3 views (Bilateral AP, Lateral, Bilateral  Sunrise) performed 03/15/2017  CLINICAL INFORMATION: Chronic right knee pain  COMPARISON: None  FINDINGS: There is evidence of tricompartmental degenerative joint disease. The  medial compartment shows joint space narrowing.The lateral compartment  shows osteophyte formation.Patellofemoral compartment shows osteophyte   formation and joint space narrowing.There is enthesopathic change of the  quadriceps attachment.There appears to be a 9 x 9 mm calcific lesion in  the posterior joint that may represent a loose body or fabella.No  fractures or dislocations.No soft tissue swelling or joint effusion. No  abnormal bone lesions.  X-rays reviewed with patient  ROS  Cardiovascular: No reported cardiovascular signs or symptoms such as High blood pressure, coronary artery disease, abnormal heart rate or rhythm, heart attack, blood thinner therapy or heart weakness and/or failure Pulmonary or Respiratory: Snoring  Neurological: No reported neurological signs or symptoms such as seizures, abnormal skin sensations, urinary and/or fecal incontinence, being born with an abnormal open spine and/or a tethered spinal cord Review of Past Neurological Studies: No results found for this or any previous visit. Psychological-Psychiatric: No reported psychological or psychiatric signs or symptoms such as difficulty sleeping, anxiety, depression, delusions or hallucinations (schizophrenial), mood swings (bipolar disorders) or suicidal ideations or attempts Gastrointestinal: No reported gastrointestinal signs or symptoms such as vomiting or evacuating blood, reflux, heartburn, alternating episodes of diarrhea and constipation, inflamed or scarred liver, or pancreas or irrregular and/or infrequent bowel movements Genitourinary: No reported renal or genitourinary signs or symptoms such as difficulty voiding or producing urine, peeing blood, non-functioning kidney, kidney stones, difficulty emptying the bladder, difficulty controlling the flow of urine, or chronic kidney disease Hematological: No reported hematological signs or symptoms such as prolonged bleeding, low or  poor functioning platelets, bruising or bleeding easily, hereditary bleeding problems, low energy levels due to low hemoglobin or being anemic Endocrine: No  reported endocrine signs or symptoms such as high or low blood sugar, rapid heart rate due to high thyroid levels, obesity or weight gain due to slow thyroid or thyroid disease Rheumatologic: No reported rheumatological signs and symptoms such as fatigue, joint pain, tenderness, swelling, redness, heat, stiffness, decreased range of motion, with or without associated rash Musculoskeletal: Negative for myasthenia gravis, muscular dystrophy, multiple sclerosis or malignant hyperthermia Work History: Retired  Allergies  Mr. Grimley is allergic to morphine and related.  Laboratory Chemistry  Inflammation Markers (CRP: Acute Phase) (ESR: Chronic Phase) No results found for: CRP, ESRSEDRATE, LATICACIDVEN                       Rheumatology Markers No results found for: RF, ANA, LABURIC, URICUR, LYMEIGGIGMAB, LYMEABIGMQN, HLAB27                      Renal Function Markers Lab Results  Component Value Date   BUN 15 10/30/2016   CREATININE 0.95 10/30/2016   GFRAA >60 10/30/2016   GFRNONAA >60 10/30/2016                              Hepatic Function Markers No results found for: AST, ALT, ALBUMIN, ALKPHOS, HCVAB, AMYLASE, LIPASE, AMMONIA                      Electrolytes Lab Results  Component Value Date   NA 136 10/30/2016   K 5.0 10/30/2016   CL 105 10/30/2016   CALCIUM 9.2 10/30/2016                        Neuropathy Markers No results found for: VITAMINB12, FOLATE, HGBA1C, HIV                      Bone Pathology Markers No results found for: VD25OH, VD125OH2TOT, [id number], [id number], 25OHVITD1, 25OHVITD2, 25OHVITD3, TESTOFREE, TESTOSTERONE                       Coagulation Parameters Lab Results  Component Value Date   APTT 29.1 05/02/2013   PLT 240 10/30/2016                        Cardiovascular Markers Lab Results  Component Value Date   HGB 14.2 10/30/2016   HCT 41.9 10/30/2016                         CA Markers No results found for: CEA, CA125, LABCA2                       Note: Lab results reviewed.  [place]  Drug: Mr. Drew  reports that he does not use drugs. Alcohol:  reports that he does not drink alcohol. Tobacco:  reports that he has never smoked. He has never used smokeless tobacco. Medical:  has a past medical history of Diabetes mellitus without complication ([place]), HLD (hyperlipidemia), and HTN (hypertension). Family: family history includes Diabetes in his father.  Past Surgical History:  Procedure Laterality Date  . APPENDECTOMY    . TONSILLECTOMY     Active Ambulatory Problems  Diagnosis Date Noted  . Yellow jacket sting 10/31/2016  . HTN (hypertension) 10/31/2016  . Diabetes ([place]) 10/31/2016  . HLD (hyperlipidemia) 10/31/2016   Resolved Ambulatory Problems    Diagnosis Date Noted  . No Resolved Ambulatory Problems   Past Medical History:  Diagnosis Date  . Diabetes mellitus without complication ([place])   . HLD (hyperlipidemia)   . HTN (hypertension)    Constitutional Exam  General appearance: Well nourished, well developed, and well hydrated. In no apparent acute distress Vitals:   07/18/17 1056 07/18/17 1105 07/18/17 1115 07/18/17 1125  BP: (!) 142/92 134/84 (!) 148/83 (!) 144/82  Pulse: 80 80 79 83  Resp: _0 Temp:    98.1 F (36.7 C)  TempSrc:    Temporal  SpO2: 98% 98% 97% 97%  Weight:      Height:       BMI Assessment: Estimated body mass index is 26.62 kg/m as calculated from the following:   Height as of this encounter: _1  (1.905 m).   Weight as of this encounter: 213 lb (96.6 kg).  BMI interpretation table: BMI level Category Range association with higher incidence of chronic pain  <18 kg/m2 Underweight   18.5-24.9 kg/m2 Ideal body weight   25-29.9 kg/m2 Overweight Increased incidence by 20%  30-34.9 kg/m2 Obese (Class I) Increased incidence by 68%  35-39.9 kg/m2 Severe obesity (Class II) Increased incidence by 136%  >40 kg/m2 Extreme obesity (Class III) Increased incidence by 254%    Patient's current BMI Ideal Body weight  Body mass index is 26.62 kg/m. Ideal body weight: 84.5 kg (186 lb 4.6 oz) Adjusted ideal body weight: 89.3 kg (196 lb 15.6 oz)   BMI Readings from Last 4 Encounters:  07/18/17 26.62 kg/m  10/31/16 26.44 kg/m   Wt Readings from Last 4 Encounters:  07/18/17 213 lb (96.6 kg)  10/31/16 217 lb 3.2 oz (98.5 kg)  Psych/Mental status: Alert, oriented x 3 (person, place, & time)       Eyes: PERLA Respiratory: No evidence of acute respiratory distress  Lumbar Spine Area Exam  Skin & Axial Inspection: No masses, redness, or swelling Alignment: Symmetrical Functional ROM: Unrestricted ROM       Stability: No instability detected Muscle Tone/Strength: Functionally intact. No obvious neuro-muscular anomalies detected. Sensory (Neurological): Unimpaired Palpation: No palpable anomalies       Provocative Tests: Lumbar Hyperextension/rotation test: deferred today       Lumbar quadrant test (Kemp's test): deferred today       Lumbar Lateral bending test: deferred today       Patrick's Maneuver: deferred today                   FABER test: deferred today       Thigh-thrust test: deferred today       S-I compression test: deferred today       S-I distraction test: deferred today        Gait & Posture Assessment  Ambulation: Unassisted Gait: Relatively normal for age and body habitus Posture: WNL   Lower Extremity Exam    Side: Right lower extremity  Side: Left lower extremity  Stability: No instability observed          Stability: No instability observed          Skin & Extremity Inspection: Skin color, temperature, and hair growth are WNL. No peripheral edema or cyanosis. No masses, redness, swelling, asymmetry, or associated skin lesions. No contractures.  Skin & Extremity Inspection: Evidence of prior arthroplastic surgery  Functional ROM: Decreased ROM for knee joint          Functional ROM: Decreased ROM for knee joint          Muscle  Tone/Strength: Functionally intact. No obvious neuro-muscular anomalies detected.  Muscle Tone/Strength: Functionally intact. No obvious neuro-muscular anomalies detected.  Sensory (Neurological): Arthropathic arthralgia  Sensory (Neurological): Arthropathic arthralgia  Palpation: Complains of area being tender to palpation, peripatellar  Palpation: Complains of area being tender to palpation, peripatellar     Procedure  67 year old male with a history of bilateral knee osteoarthritis status post left partial knee replacement who has been told he also needs right knee replacement, referred here from Dr. Candelaria Stagers for evaluation in regards to bilateral genicular nerve block under fluoroscopy with moderate sedation.  Risks and benefits of the procedure were discussed with the patient.  Patient would like to proceed.  See procedure note.

## 2017-07-18 NOTE — Patient Instructions (Signed)

## 2017-07-19 ENCOUNTER — Telehealth: Payer: Self-pay

## 2017-07-19 NOTE — Telephone Encounter (Signed)
No problems post procedure. 

## 2017-08-20 NOTE — Discharge Instructions (Signed)

## 2017-08-21 ENCOUNTER — Other Ambulatory Visit: Payer: Self-pay

## 2017-08-21 ENCOUNTER — Encounter: Payer: Self-pay | Admitting: *Deleted

## 2017-08-29 ENCOUNTER — Ambulatory Visit
Admission: RE | Admit: 2017-08-29 | Discharge: 2017-08-29 | Disposition: A | Discharge status: Home / Self Care | Payer: Medicare Other | Source: Ambulatory Visit | Attending: Ophthalmology | Admitting: Ophthalmology

## 2017-08-29 ENCOUNTER — Ambulatory Visit: Payer: Medicare Other | Admitting: Anesthesiology

## 2017-08-29 ENCOUNTER — Encounter
Admission: RE | Disposition: A | Discharge status: Home / Self Care | Payer: Self-pay | Source: Ambulatory Visit | Attending: Ophthalmology

## 2017-08-29 DIAGNOSIS — K219 Gastro-esophageal reflux disease without esophagitis: Secondary | ICD-10-CM | POA: Diagnosis not present

## 2017-08-29 DIAGNOSIS — Z885 Allergy status to narcotic agent status: Secondary | ICD-10-CM | POA: Diagnosis not present

## 2017-08-29 DIAGNOSIS — E1136 Type 2 diabetes mellitus with diabetic cataract: Secondary | ICD-10-CM | POA: Insufficient documentation

## 2017-08-29 DIAGNOSIS — H2511 Age-related nuclear cataract, right eye: Secondary | ICD-10-CM | POA: Diagnosis not present

## 2017-08-29 DIAGNOSIS — H401131 Primary open-angle glaucoma, bilateral, mild stage: Secondary | ICD-10-CM | POA: Diagnosis not present

## 2017-08-29 DIAGNOSIS — G4730 Sleep apnea, unspecified: Secondary | ICD-10-CM | POA: Diagnosis not present

## 2017-08-29 DIAGNOSIS — K589 Irritable bowel syndrome without diarrhea: Secondary | ICD-10-CM | POA: Insufficient documentation

## 2017-08-29 DIAGNOSIS — Z7984 Long term (current) use of oral hypoglycemic drugs: Secondary | ICD-10-CM | POA: Diagnosis not present

## 2017-08-29 DIAGNOSIS — M1990 Unspecified osteoarthritis, unspecified site: Secondary | ICD-10-CM | POA: Insufficient documentation

## 2017-08-29 DIAGNOSIS — I10 Essential (primary) hypertension: Secondary | ICD-10-CM | POA: Diagnosis not present

## 2017-08-29 HISTORY — DX: Orthostatic hypotension: I95.1

## 2017-08-29 HISTORY — DX: Sleep apnea, unspecified: G47.30

## 2017-08-29 HISTORY — DX: Gastro-esophageal reflux disease without esophagitis: K21.9

## 2017-08-29 HISTORY — DX: Unspecified osteoarthritis, unspecified site: M19.90

## 2017-08-29 HISTORY — PX: CATARACT EXTRACTION W/PHACO: SHX586

## 2017-08-29 HISTORY — DX: Unspecified hearing loss, unspecified ear: H91.90

## 2017-08-29 HISTORY — PX: INSERTION OF ANTERIOR SEGMENT AQUEOUS DRAINAGE DEVICE (ISTENT): SHX6783

## 2017-08-29 HISTORY — DX: Presence of dental prosthetic device (complete) (partial): Z97.2

## 2017-08-29 HISTORY — DX: Irritable bowel syndrome, unspecified: K58.9

## 2017-08-29 LAB — GLUCOSE, CAPILLARY
GLUCOSE-CAPILLARY: 189 mg/dL — AB (ref 70–99)
GLUCOSE-CAPILLARY: 185 mg/dL — AB (ref 70–99)

## 2017-08-29 SURGERY — PHACOEMULSIFICATION, CATARACT, WITH IOL INSERTION
Anesthesia: Monitor Anesthesia Care | Site: Eye | Laterality: Right | Wound class: "Clean "

## 2017-08-29 MED ORDER — CEFUROXIME OPHTHALMIC INJECTION 1 MG/0.1 ML
INJECTION | OPHTHALMIC | Status: DC | PRN
  Administered 2017-08-29: 0.1 mL via INTRACAMERAL

## 2017-08-29 MED ORDER — TETRACAINE HCL 0.5 % OP SOLN
1.0000 [drp] | OPHTHALMIC | Status: DC | PRN
  Administered 2017-08-29 (×3): 1 [drp] via OPHTHALMIC

## 2017-08-29 MED ORDER — LACTATED RINGERS IV SOLN: 1000.0000 mL | INTRAVENOUS | Status: DC

## 2017-08-29 MED ORDER — ACETAMINOPHEN 160 MG/5ML PO SOLN: 325.0000 mg | ORAL | Status: DC | PRN

## 2017-08-29 MED ORDER — BSS IO SOLN
INTRAOCULAR | Status: DC | PRN
  Administered 2017-08-29: 69 mL via OPHTHALMIC

## 2017-08-29 MED ORDER — ACETAMINOPHEN 325 MG PO TABS: 325.0000 mg | ORAL_TABLET | ORAL | Status: DC | PRN

## 2017-08-29 MED ORDER — BRIMONIDINE TARTRATE-TIMOLOL 0.2-0.5 % OP SOLN
OPHTHALMIC | Status: DC | PRN
  Administered 2017-08-29: 1 [drp] via OPHTHALMIC

## 2017-08-29 MED ORDER — MOXIFLOXACIN HCL 0.5 % OP SOLN
1.0000 [drp] | OPHTHALMIC | Status: DC | PRN
  Administered 2017-08-29 (×3): 1 [drp] via OPHTHALMIC

## 2017-08-29 MED ORDER — FENTANYL CITRATE (PF) 100 MCG/2ML IJ SOLN
INTRAMUSCULAR | Status: DC | PRN
  Administered 2017-08-29: 50 ug via INTRAVENOUS

## 2017-08-29 MED ORDER — CYCLOPENTOLATE HCL 2 % OP SOLN
1.0000 [drp] | OPHTHALMIC | Status: DC | PRN
  Administered 2017-08-29 (×3): 1 [drp] via OPHTHALMIC

## 2017-08-29 MED ORDER — NA HYALUR & NA CHOND-NA HYALUR 0.4-0.35 ML IO KIT
PACK | INTRAOCULAR | Status: DC | PRN
  Administered 2017-08-29: 1 mL via INTRAOCULAR

## 2017-08-29 MED ORDER — PHENYLEPHRINE HCL 10 % OP SOLN
1.0000 [drp] | OPHTHALMIC | Status: DC | PRN
  Administered 2017-08-29 (×3): 1 [drp] via OPHTHALMIC

## 2017-08-29 MED ORDER — MIDAZOLAM HCL 2 MG/2ML IJ SOLN
INTRAMUSCULAR | Status: DC | PRN
  Administered 2017-08-29: 2 mg via INTRAVENOUS

## 2017-08-29 MED ORDER — LIDOCAINE HCL (PF) 2 % IJ SOLN
INTRAOCULAR | Status: DC | PRN
  Administered 2017-08-29: 1 mL via INTRAMUSCULAR

## 2017-08-29 SURGICAL SUPPLY — 30 items
CANNULA ANT/CHMB 27G (MISCELLANEOUS) ×1 IMPLANT
CANNULA ANT/CHMB 27GA (MISCELLANEOUS) ×2 IMPLANT
CARTRIDGE ABBOTT (MISCELLANEOUS) IMPLANT
DEVICE INJECT ISTENT W (Stent) ×1 IMPLANT
GLOVE SURG LX 7.5 STRW (GLOVE) ×1
GLOVE SURG LX STRL 7.5 STRW (GLOVE) ×1 IMPLANT
GLOVE SURG TRIUMPH 8.0 PF LTX (GLOVE) ×2 IMPLANT
GOWN STRL REUS W/ TWL LRG LVL3 (GOWN DISPOSABLE) ×2 IMPLANT
GOWN STRL REUS W/TWL LRG LVL3 (GOWN DISPOSABLE) ×2
INJECT ISTENT W (Stent) ×2 IMPLANT
LENS IOL TECNIS ITEC 18.0 (Intraocular Lens) ×1 IMPLANT
MARKER SKIN DUAL TIP RULER LAB (MISCELLANEOUS) ×2 IMPLANT
NDL FILTER BLUNT 18X1 1/2 (NEEDLE) ×1 IMPLANT
NDL RETROBULBAR .5 NSTRL (NEEDLE) IMPLANT
NEEDLE FILTER BLUNT 18X 1/2SAF (NEEDLE) ×1
NEEDLE FILTER BLUNT 18X1 1/2 (NEEDLE) ×1 IMPLANT
PACK CATARACT BRASINGTON (MISCELLANEOUS) ×2 IMPLANT
PACK EYE AFTER SURG (MISCELLANEOUS) ×2 IMPLANT
PACK OPTHALMIC (MISCELLANEOUS) ×2 IMPLANT
RING MALYGIN 7.0 (MISCELLANEOUS) IMPLANT
SPONGE SURG I SPEAR (MISCELLANEOUS) ×1 IMPLANT
SUT ETHILON 10-0 CS-B-6CS-B-6 (SUTURE)
SUT VICRYL  9 0 (SUTURE)
SUT VICRYL 9 0 (SUTURE) IMPLANT
SUTURE EHLN 10-0 CS-B-6CS-B-6 (SUTURE) IMPLANT
SYR 3ML LL SCALE MARK (SYRINGE) ×2 IMPLANT
SYR 5ML LL (SYRINGE) ×2 IMPLANT
SYR TB 1ML LUER SLIP (SYRINGE) ×2 IMPLANT
WATER STERILE IRR 500ML POUR (IV SOLUTION) ×2 IMPLANT
WIPE NON LINTING 3.25X3.25 (MISCELLANEOUS) ×2 IMPLANT

## 2017-08-29 NOTE — Transfer of Care (Signed)
Immediate Anesthesia Transfer of Care Note  Patient: Vernon Payne.  Procedure(s) Performed: CATARACT EXTRACTION PHACO AND INTRAOCULAR LENS PLACEMENT (IOC) RIGHT DIABETIC (Right Eye) (ISTENT) (Right Eye)  Patient Location: PACU  Anesthesia Type: MAC  Level of Consciousness: awake, alert  and patient cooperative  Airway and Oxygen Therapy: Patient Spontanous Breathing and Patient connected to supplemental oxygen  Post-op Assessment: Post-op Vital signs reviewed, Patient's Cardiovascular Status Stable, Respiratory Function Stable, Patent Airway and No signs of Nausea or vomiting  Post-op Vital Signs: Reviewed and stable  Complications: No apparent anesthesia complications

## 2017-08-29 NOTE — Anesthesia Preprocedure Evaluation (Signed)
Anesthesia Evaluation  Patient identified by MRN, date of birth, ID band Patient awake    Reviewed: Allergy & Precautions, H&P , NPO status , Patient's Chart, lab work & pertinent test results, reviewed documented beta blocker date and time   Airway Mallampati: I  TM Distance: >3 FB Neck ROM: full    Dental no notable dental hx.    Pulmonary sleep apnea ,    Pulmonary exam normal breath sounds clear to auscultation       Cardiovascular Exercise Tolerance: Good hypertension, negative cardio ROS Normal cardiovascular exam Rhythm:regular Rate:Normal     Neuro/Psych negative neurological ROS  negative psych ROS   GI/Hepatic Neg liver ROS, GERD  ,  Endo/Other  diabetes, Type 2, Oral Hypoglycemic Agents  Renal/GU negative Renal ROS  negative genitourinary   Musculoskeletal   Abdominal   Peds  Hematology negative hematology ROS (+)   Anesthesia Other Findings   Reproductive/Obstetrics negative OB ROS                             Anesthesia Physical Anesthesia Plan  ASA: II  Anesthesia Plan: MAC   Post-op Pain Management:    Induction:   PONV Risk Score and Plan:   Airway Management Planned:   Additional Equipment:   Intra-op Plan:   Post-operative Plan:   Informed Consent: I have reviewed the patients History and Physical, chart, labs and discussed the procedure including the risks, benefits and alternatives for the proposed anesthesia with the patient or authorized representative who has indicated his/her understanding and acceptance.   Dental Advisory Given  Plan Discussed with: CRNA  Anesthesia Plan Comments:         Anesthesia Quick Evaluation

## 2017-08-29 NOTE — Anesthesia Postprocedure Evaluation (Signed)
Anesthesia Post Note  Patient: Vernon Payne.  Procedure(s) Performed: CATARACT EXTRACTION PHACO AND INTRAOCULAR LENS PLACEMENT (IOC) RIGHT DIABETIC (Right Eye) (ISTENT) (Right Eye)  Patient location during evaluation: PACU Anesthesia Type: MAC Level of consciousness: awake and alert Pain management: pain level controlled Vital Signs Assessment: post-procedure vital signs reviewed and stable Respiratory status: spontaneous breathing, nonlabored ventilation, respiratory function stable and patient connected to nasal cannula oxygen Cardiovascular status: stable and blood pressure returned to baseline Postop Assessment: no apparent nausea or vomiting Anesthetic complications: no    Trecia Rogers

## 2017-08-29 NOTE — Anesthesia Procedure Notes (Signed)
Procedure Name: MAC Date/Time: 08/29/2017 9:13 AM Performed by: Janna Arch, CRNA Pre-anesthesia Checklist: Patient identified, Emergency Drugs available, Suction available and Patient being monitored Patient Re-evaluated:Patient Re-evaluated prior to induction Oxygen Delivery Method: Nasal cannula

## 2017-08-29 NOTE — Op Note (Signed)
LOCATION:  [place]   PREOPERATIVE DIAGNOSIS:    Nuclear sclerotic cataract right eye. 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 Item Serial No. Manufacturer Lot No. LRB No. Used  Tamsen Meek - [id number] Stent INJECT ISTENT [id number] [employer] [id number] Right 1     PCB00 18.0 D PCIOL   ULTRASOUND TIME: 19 % of 1 minutes, 16 seconds.  CDE 14.8   SURGEON:  Wyonia Hough, MD   ANESTHESIA:  Topical with tetracaine drops and 2% Xylocaine jelly, augmented with 1% preservative-free intracameral lidocaine.    COMPLICATIONS:  None.   DESCRIPTION OF PROCEDURE:  The patient was identified in the holding room and transported to the operating room and placed in the supine position under the operating microscope.  The right eye was identified as the operative eye and it was prepped and draped in the usual sterile ophthalmic fashion.   A 1 millimeter clear-corneal paracentesis was made at the 12:00 position.  0.5 ml of preservative-free 1% lidocaine was injected into the anterior chamber. The anterior chamber was filled with Viscoat viscoelastic.  A 2.4 millimeter keratome was used to make a near-clear corneal incision at the 9:00 position.   The microscope was adjusted and a gonioprism was used to visulaize the trabecular meshwork.  The istent injector was advanced across the anterior chamber under viscoelastic.  The first iStent inject was deployed into the trabecular meshwork at the 4:00 position.  The second iStent inject was placed two clock hours counterclockwise into thje meshwork.  Proper postioning was confirmed.  A  curvilinear capsulorrhexis was made with a cystotome and capsulorrhexis forceps.  Balanced salt solution was used to hydrodissect and hydrodelineate the nucleus.   Phacoemulsification was then used in stop and chop fashion to remove the lens nucleus and epinucleus.  The remaining cortex was then removed using the irrigation and aspiration handpiece. Provisc was then placed into the capsular bag to distend it for lens placement.  A lens was then injected into the capsular bag.  The remaining viscoelastic was aspirated.   Wounds were hydrated with balanced salt solution.  The anterior chamber was inflated to a physiologic pressure with balanced salt solution.  No wound leaks were noted. Cefuroxime 0.1 ml of a 10mg /ml solution was injected into the anterior chamber for a dose of 1 mg of intracameral antibiotic at the completion of the case.   Timolol and Brimonidine drops were applied to the eye.  The patient was taken to the recovery room in stable condition without complications of anesthesia or surgery.   Janese Radabaugh 08/29/2017, 9:36 AM

## 2017-08-29 NOTE — H&P (Signed)
The History and Physical notes are on paper, have been signed, and are to be scanned. The patient remains stable and unchanged from the H&P.   Previous H&P reviewed, patient examined, and there are no changes.  Roy Tokarz 08/29/2017 8:17 AM

## 2017-08-30 ENCOUNTER — Encounter: Payer: Self-pay | Admitting: Ophthalmology

## 2017-09-04 ENCOUNTER — Encounter: Payer: Self-pay | Admitting: Student in an Organized Health Care Education/Training Program

## 2017-09-04 ENCOUNTER — Other Ambulatory Visit: Payer: Self-pay

## 2017-09-04 ENCOUNTER — Ambulatory Visit
Payer: Medicare Other | Attending: Student in an Organized Health Care Education/Training Program | Admitting: Student in an Organized Health Care Education/Training Program

## 2017-09-04 VITALS — BP 145/90 | HR 93 | Temp 98.2°F | Resp 16 | Ht 76.0 in | Wt 217.0 lb

## 2017-09-04 DIAGNOSIS — Z96652 Presence of left artificial knee joint: Secondary | ICD-10-CM | POA: Diagnosis not present

## 2017-09-04 DIAGNOSIS — Z833 Family history of diabetes mellitus: Secondary | ICD-10-CM | POA: Diagnosis not present

## 2017-09-04 DIAGNOSIS — E785 Hyperlipidemia, unspecified: Secondary | ICD-10-CM | POA: Diagnosis not present

## 2017-09-04 DIAGNOSIS — Z885 Allergy status to narcotic agent status: Secondary | ICD-10-CM | POA: Insufficient documentation

## 2017-09-04 DIAGNOSIS — Z7982 Long term (current) use of aspirin: Secondary | ICD-10-CM | POA: Diagnosis not present

## 2017-09-04 DIAGNOSIS — Z79899 Other long term (current) drug therapy: Secondary | ICD-10-CM | POA: Insufficient documentation

## 2017-09-04 DIAGNOSIS — K589 Irritable bowel syndrome without diarrhea: Secondary | ICD-10-CM | POA: Diagnosis not present

## 2017-09-04 DIAGNOSIS — M25511 Pain in right shoulder: Secondary | ICD-10-CM | POA: Insufficient documentation

## 2017-09-04 DIAGNOSIS — G4730 Sleep apnea, unspecified: Secondary | ICD-10-CM | POA: Insufficient documentation

## 2017-09-04 DIAGNOSIS — Z7984 Long term (current) use of oral hypoglycemic drugs: Secondary | ICD-10-CM | POA: Diagnosis not present

## 2017-09-04 DIAGNOSIS — I10 Essential (primary) hypertension: Secondary | ICD-10-CM | POA: Insufficient documentation

## 2017-09-04 DIAGNOSIS — M545 Low back pain: Secondary | ICD-10-CM | POA: Diagnosis not present

## 2017-09-04 DIAGNOSIS — G894 Chronic pain syndrome: Secondary | ICD-10-CM | POA: Insufficient documentation

## 2017-09-04 DIAGNOSIS — H9190 Unspecified hearing loss, unspecified ear: Secondary | ICD-10-CM | POA: Diagnosis not present

## 2017-09-04 DIAGNOSIS — M1712 Unilateral primary osteoarthritis, left knee: Secondary | ICD-10-CM | POA: Insufficient documentation

## 2017-09-04 DIAGNOSIS — E119 Type 2 diabetes mellitus without complications: Secondary | ICD-10-CM | POA: Insufficient documentation

## 2017-09-04 DIAGNOSIS — M170 Bilateral primary osteoarthritis of knee: Secondary | ICD-10-CM | POA: Diagnosis not present

## 2017-09-04 DIAGNOSIS — K219 Gastro-esophageal reflux disease without esophagitis: Secondary | ICD-10-CM | POA: Insufficient documentation

## 2017-09-04 DIAGNOSIS — M1711 Unilateral primary osteoarthritis, right knee: Secondary | ICD-10-CM

## 2017-09-04 NOTE — Progress Notes (Signed)
Patient's Name: Vernon Payne.  MRN: [id number]  Referring Provider: Rusty Aus, MD  DOB: 1950-06-12  PCP: Rusty Aus, MD  DOS: 09/04/2017  Note by: Gillis Santa, MD  Service setting: Ambulatory outpatient  Specialty: Interventional Pain Management  Location: [facility] (AMB) Pain Management Facility    Patient type: Established   Primary Reason(s) for Visit: Encounter for post-procedure evaluation of chronic illness with mild to moderate exacerbation CC: Knee Pain (bilaterally); Back Pain (lower); and Shoulder Pain (right)  HPI  Vernon Payne is a 67 y.o. year old, male patient, who comes today for a post-procedure evaluation. He has Yellow jacket sting; HTN (hypertension); Diabetes ([place]); HLD (hyperlipidemia); Primary osteoarthritis of right knee; Primary osteoarthritis of left knee; and Status post left partial knee replacement on their problem list. His primarily concern today is the Knee Pain (bilaterally); Back Pain (lower); and Shoulder Pain (right)  Pain Assessment: Location: Right, Left Knee Radiating: denies Onset: More than a month ago Duration: Chronic pain Quality: Discomfort, Constant, Dull Severity: 2 /10 (subjective, self-reported pain score)  Note: Reported level is compatible with observation.                         When using our objective Pain Scale, levels between 6 and 10/10 are said to belong in an emergency room, as it progressively worsens from a 6/10, described as severely limiting, requiring emergency care not usually available at an outpatient pain management facility. At a 6/10 level, communication becomes difficult and requires great effort. Assistance to reach the emergency department may be required. Facial flushing and profuse sweating along with potentially dangerous increases in heart rate and blood pressure will be evident. Effect on ADL: sleep disruption Timing: Constant Modifying factors: procedures, medications, ice BP: (!) 145/90  HR: 93  Vernon Payne  comes in today for post-procedure evaluation after the treatment done on 07/19/2017.  Further details on both, my assessment(s), as well as the proposed treatment plan, please see below.  Post-Procedure Assessment  07/18/2017 Procedure: Bilateral genicular nerve block Pre-procedure pain score:  4/10 Post-procedure pain score: 2/10         Influential Factors: BMI: 26.41 kg/m Intra-procedural challenges: None observed.         Assessment challenges: None detected.              Reported side-effects: None.        Post-procedural adverse reactions or complications: None reported         Sedation: Please see nurses note. When no sedatives are used, the analgesic levels obtained are directly associated to the effectiveness of the local anesthetics. However, when sedation is provided, the level of analgesia obtained during the initial 1 hour following the intervention, is believed to be the result of a combination of factors. These factors may include, but are not limited to: 1. The effectiveness of the local anesthetics used. 2. The effects of the analgesic(s) and/or anxiolytic(s) used. 3. The degree of discomfort experienced by the patient at the time of the procedure. 4. The patients ability and reliability in recalling and recording the events. 5. The presence and influence of possible secondary gains and/or psychosocial factors. Reported result: Relief experienced during the 1st hour after the procedure: 100 % (Ultra-Short Term Relief)            Interpretative annotation: Clinically appropriate result. Analgesia during this period is likely to be Local Anesthetic and/or IV Sedative (Analgesic/Anxiolytic) related.  Effects of local anesthetic: The analgesic effects attained during this period are directly associated to the localized infiltration of local anesthetics and therefore cary significant diagnostic value as to the etiological location, or anatomical origin, of the pain.  Expected duration of relief is directly dependent on the pharmacodynamics of the local anesthetic used. Long-acting (4-6 hours) anesthetics used.  Reported result: Relief during the next 4 to 6 hour after the procedure: 95 % (Short-Term Relief)            Interpretative annotation: Clinically appropriate result. Analgesia during this period is likely to be Local Anesthetic-related.          Long-term benefit: Defined as the period of time past the expected duration of local anesthetics (1 hour for short-acting and 4-6 hours for long-acting). With the possible exception of prolonged sympathetic blockade from the local anesthetics, benefits during this period are typically attributed to, or associated with, other factors such as analgesic sensory neuropraxia, antiinflammatory effects, or beneficial biochemical changes provided by agents other than the local anesthetics.  Reported result: Extended relief following procedure: 80 % (Long-Term Relief)            Interpretative annotation: Clinically appropriate result. Good relief. No permanent benefit expected. Inflammation plays a part in the etiology to the pain.          Current benefits: Defined as reported results that persistent at this point in time.   Analgesia: 75-100 %            Function: Vernon Payne reports improvement in function ROM: Vernon Payne reports improvement in ROM Interpretative annotation: Ongoing benefit. Therapeutic benefit observed. Effective therapeutic approach.          Interpretation: Results would suggest a successful diagnostic and therapeutic intervention.                  Plan:  Set up procedure as a PRN palliative treatment option for this patient.                Laboratory Chemistry  Inflammation Markers (CRP: Acute Phase) (ESR: Chronic Phase) No results found for: CRP, ESRSEDRATE, LATICACIDVEN                       Rheumatology Markers No results found for: RF, ANA, LABURIC, URICUR, LYMEIGGIGMAB, LYMEABIGMQN, HLAB27                       Renal Function Markers Lab Results  Component Value Date   BUN 15 10/30/2016   CREATININE 0.95 10/30/2016   GFRAA >60 10/30/2016   GFRNONAA >60 10/30/2016                             Hepatic Function Markers No results found for: AST, ALT, ALBUMIN, ALKPHOS, HCVAB, AMYLASE, LIPASE, AMMONIA                      Electrolytes Lab Results  Component Value Date   NA 136 10/30/2016   K 5.0 10/30/2016   CL 105 10/30/2016   CALCIUM 9.2 10/30/2016                        Neuropathy Markers No results found for: VITAMINB12, FOLATE, HGBA1C, HIV                      Bone  Pathology Markers No results found for: Marveen Reeks, [id number], [id number], 25OHVITD1, 25OHVITD2, 25OHVITD3, TESTOFREE, TESTOSTERONE                       Coagulation Parameters Lab Results  Component Value Date   APTT 29.1 05/02/2013   PLT 240 10/30/2016                        Cardiovascular Markers Lab Results  Component Value Date   HGB 14.2 10/30/2016   HCT 41.9 10/30/2016                         CA Markers No results found for: CEA, CA125, LABCA2                      Note: Lab results reviewed.  Recent Diagnostic Imaging Results  DG C-Arm 1-60 Min-No Report Fluoroscopy was utilized by the requesting physician.  No radiographic  interpretation.   Complexity Note: Imaging results reviewed. Results shared with Mr. Weidinger, using Layman's terms.                         Meds   Current Outpatient Medications:  .  amLODipine (NORVASC) 5 MG tablet, Take 5 mg by mouth at bedtime as needed., Disp: , Rfl:  .  aspirin EC 81 MG tablet, Take 81 mg by mouth daily., Disp: , Rfl:  .  diphenhydramine-acetaminophen (TYLENOL PM) 25-500 MG TABS tablet, Take 2 tablets by mouth at bedtime as needed., Disp: , Rfl:  .  dorzolamide (TRUSOPT) 2 % ophthalmic solution, Place 1 drop into both eyes 2 (two) times daily., Disp: , Rfl:  .  glimepiride (AMARYL) 2 MG tablet, Take 4 mg by mouth daily. , Disp:  , Rfl:  .  latanoprost (XALATAN) 0.005 % ophthalmic solution, Place 1 drop into both eyes at bedtime., Disp: , Rfl:  .  lisinopril (PRINIVIL,ZESTRIL) 10 MG tablet, Take 10 mg by mouth daily., Disp: , Rfl:  .  metFORMIN (GLUCOPHAGE) 500 MG tablet, Take 1,000 mg by mouth 2 (two) times daily with a meal. , Disp: , Rfl:  .  Multiple Vitamin (MULTI-VITAMINS) TABS, Take 1 tablet by mouth daily., Disp: , Rfl:  .  Probiotic Product (ALIGN PO), Take by mouth daily., Disp: , Rfl:  .  omeprazole (PRILOSEC OTC) 20 MG tablet, Take 20 mg by mouth daily., Disp: , Rfl:   ROS  Constitutional: Denies any fever or chills Gastrointestinal: No reported hemesis, hematochezia, vomiting, or acute GI distress Musculoskeletal: Denies any acute onset joint swelling, redness, loss of ROM, or weakness Neurological: No reported episodes of acute onset apraxia, aphasia, dysarthria, agnosia, amnesia, paralysis, loss of coordination, or loss of consciousness  Allergies  Mr. Voght is allergic to morphine and related.  [place]  Drug: Mr. Oyama  reports that he does not use drugs. Alcohol:  reports that he does not drink alcohol. Tobacco:  reports that he has never smoked. He has never used smokeless tobacco. Medical:  has a past medical history of Arthritis, Diabetes mellitus without complication ([place]), GERD (gastroesophageal reflux disease), Hard of hearing, HLD (hyperlipidemia), HTN (hypertension), Hypotension, postural, IBS (irritable bowel syndrome), Sleep apnea, and Wears dentures. Surgical: Mr. Lever  has a past surgical history that includes Appendectomy; Tonsillectomy; Joint replacement; Rotator cuff repair; LASIK; Back surgery; Cataract extraction w/PHACO (Right, 08/29/2017); and Insertion of anterior segment aqueous  drainage device (ISTENT) (Right, 08/29/2017). Family: family history includes Diabetes in his father.  Constitutional Exam  General appearance: Well nourished, well developed, and well hydrated. In no apparent  acute distress Vitals:   09/04/17 0947  BP: (!) 145/90  Pulse: 93  Resp: 16  Temp: 98.2 F (36.8 C)  TempSrc: Oral  SpO2: 99%  Weight: 217 lb (98.4 kg)  Height: 6' 4" (1.93 m)   BMI Assessment: Estimated body mass index is 26.41 kg/m as calculated from the following:   Height as of this encounter: 6' 4" (1.93 m).   Weight as of this encounter: 217 lb (98.4 kg).  BMI interpretation table: BMI level Category Range association with higher incidence of chronic pain  <18 kg/m2 Underweight   18.5-24.9 kg/m2 Ideal body weight   25-29.9 kg/m2 Overweight Increased incidence by 20%  30-34.9 kg/m2 Obese (Class I) Increased incidence by 68%  35-39.9 kg/m2 Severe obesity (Class II) Increased incidence by 136%  >40 kg/m2 Extreme obesity (Class III) Increased incidence by 254%   Patient's current BMI Ideal Body weight  Body mass index is 26.41 kg/m. Ideal body weight: 86.8 kg (191 lb 5.7 oz) Adjusted ideal body weight: 91.5 kg (201 lb 9.9 oz)   BMI Readings from Last 4 Encounters:  09/04/17 26.41 kg/m  08/29/17 26.41 kg/m  07/18/17 26.62 kg/m  10/31/16 26.44 kg/m   Wt Readings from Last 4 Encounters:  09/04/17 217 lb (98.4 kg)  08/29/17 217 lb (98.4 kg)  07/18/17 213 lb (96.6 kg)  10/31/16 217 lb 3.2 oz (98.5 kg)  Psych/Mental status: Alert, oriented x 3 (person, place, & time)       Eyes: PERLA Respiratory: No evidence of acute respiratory distress  Cervical Spine Area Exam  Skin & Axial Inspection: No masses, redness, edema, swelling, or associated skin lesions Alignment: Symmetrical Functional ROM: Unrestricted ROM      Stability: No instability detected Muscle Tone/Strength: Functionally intact. No obvious neuro-muscular anomalies detected. Sensory (Neurological): Unimpaired Palpation: No palpable anomalies              Upper Extremity (UE) Exam    Side: Right upper extremity  Side: Left upper extremity  Skin & Extremity Inspection: Skin color, temperature, and hair  growth are WNL. No peripheral edema or cyanosis. No masses, redness, swelling, asymmetry, or associated skin lesions. No contractures.  Skin & Extremity Inspection: Skin color, temperature, and hair growth are WNL. No peripheral edema or cyanosis. No masses, redness, swelling, asymmetry, or associated skin lesions. No contractures.  Functional ROM: Unrestricted ROM          Functional ROM: Unrestricted ROM          Muscle Tone/Strength: Functionally intact. No obvious neuro-muscular anomalies detected.  Muscle Tone/Strength: Functionally intact. No obvious neuro-muscular anomalies detected.  Sensory (Neurological): Unimpaired          Sensory (Neurological): Unimpaired          Palpation: No palpable anomalies              Palpation: No palpable anomalies              Provocative Test(s):  Phalen's test: deferred Tinel's test: deferred Apley's scratch test (touch opposite shoulder):  Action 1 (Across chest): deferred Action 2 (Overhead): deferred Action 3 (LB reach): deferred   Provocative Test(s):  Phalen's test: deferred Tinel's test: deferred Apley's scratch test (touch opposite shoulder):  Action 1 (Across chest): deferred Action 2 (Overhead): deferred Action 3 (LB reach): deferred  Thoracic Spine Area Exam  Skin & Axial Inspection: No masses, redness, or swelling Alignment: Symmetrical Functional ROM: Unrestricted ROM Stability: No instability detected Muscle Tone/Strength: Functionally intact. No obvious neuro-muscular anomalies detected. Sensory (Neurological): Unimpaired Muscle strength & Tone: No palpable anomalies  Lumbar Spine Area Exam  Skin & Axial Inspection: No masses, redness, or swelling Alignment: Symmetrical Functional ROM: Unrestricted ROM       Stability: No instability detected Muscle Tone/Strength: Functionally intact. No obvious neuro-muscular anomalies detected. Sensory (Neurological): Unimpaired Palpation: No palpable anomalies       Provocative  Tests: Lumbar Hyperextension/rotation test: deferred today       Lumbar quadrant test (Kemp's test): deferred today       Lumbar Lateral bending test: deferred today       Patrick's Maneuver: deferred today                   FABER test: deferred today                   Thigh-thrust test: deferred today       S-I compression test: deferred today       S-I distraction test: deferred today        Gait & Posture Assessment  Ambulation: Unassisted Gait: Relatively normal for age and body habitus Posture: WNL   Lower Extremity Exam    Side: Right lower extremity  Side: Left lower extremity  Stability: No instability observed          Stability: No instability observed          Skin & Extremity Inspection: Skin color, temperature, and hair growth are WNL. No peripheral edema or cyanosis. No masses, redness, swelling, asymmetry, or associated skin lesions. No contractures.  Skin & Extremity Inspection: Skin color, temperature, and hair growth are WNL. No peripheral edema or cyanosis. No masses, redness, swelling, asymmetry, or associated skin lesions. No contractures.  Functional ROM: Unrestricted ROM                  Functional ROM: Unrestricted ROM                  Muscle Tone/Strength: Functionally intact. No obvious neuro-muscular anomalies detected.  Muscle Tone/Strength: Functionally intact. No obvious neuro-muscular anomalies detected.  Sensory (Neurological): Musculoskeletal pain pattern improved after treatment  Sensory (Neurological): Musculoskeletal pain pattern improved after treatment  Palpation: No palpable anomalies  Palpation: No palpable anomalies   Assessment  Primary Diagnosis & Pertinent Problem List: The primary encounter diagnosis was Primary osteoarthritis of right knee. Diagnoses of Primary osteoarthritis of left knee, Status post left partial knee replacement, and Chronic pain syndrome were also pertinent to this visit.  Status Diagnosis   Controlled Controlled Controlled 1. Primary osteoarthritis of right knee   2. Primary osteoarthritis of left knee   3. Status post left partial knee replacement   4. Chronic pain syndrome     Problems updated and reviewed during this visit: Problem  Primary Osteoarthritis of Right Knee  Primary Osteoarthritis of Left Knee  Status Post Left Partial Knee Replacement   67 year old male with a history of bilateral knee osteoarthritis status post bilateral genicular nerve block who presents for follow-up.  Patient notes significant pain relief that is ongoing and rates it as approximately 80%.  Patient is able to tolerate standing on his feet for an extended period of time, he was able to go on a cruise and was able to go on expeditions during his  vacation given improvement in his knee pain, and has easier time going from sitting to standing given relief of his knee pain.  Given that the patient is continuing to have ongoing benefit after his bilateral genicular nerve block, we can repeat genicular nerve block if and when patient has return of symptoms.  We also discussed bilateral genicular nerve radio frequency ablation in the future.  Time Note: Greater than 50% of the 25 minute(s) of face-to-face time spent with Mr. Burmester, was spent in counseling/coordination of care regarding: Mr. Rosevear primary cause of pain, the treatment plan, treatment alternatives, the risks and possible complications of proposed treatment, the results, interpretation and significance of  his recent diagnostic interventional treatment(s), realistic expectations and the goals of pain management (increased in functionality).  Plan of Care   Lab-work, procedure(s), and/or referral(s): Orders Placed This Encounter  Procedures  . GENICULAR NERVE BLOCK  As needed   Provider-requested follow-up: Return if symptoms worsen or fail to improve.  No future appointments.  Primary Care Physician: Rusty Aus, MD Location:  [facility] Out[facility] Management Facility Note by: Gillis Santa, M.D Date: 09/04/2017; Time: 10:22 AM  There are no Patient Instructions on file for this visit.

## 2017-09-04 NOTE — Progress Notes (Signed)
Safety precautions to be maintained throughout the outpatient stay will include: orient to surroundings, keep bed in low position, maintain call bell within reach at all times, provide assistance with transfer out of bed and ambulation.  

## 2017-09-04 NOTE — Patient Instructions (Signed)

## 2017-11-13 ENCOUNTER — Encounter: Payer: Self-pay | Admitting: *Deleted

## 2017-11-13 ENCOUNTER — Other Ambulatory Visit: Payer: Self-pay

## 2017-11-19 NOTE — Discharge Instructions (Signed)

## 2017-11-20 NOTE — Anesthesia Preprocedure Evaluation (Addendum)
Anesthesia Evaluation  Patient identified by MRN, date of birth, ID band Patient awake    Reviewed: Allergy & Precautions, NPO status , Patient's Chart, lab work & pertinent test results  History of Anesthesia Complications Negative for: history of anesthetic complications  Airway Mallampati: II  TM Distance: >3 FB Neck ROM: Full    Dental  (+)    Pulmonary sleep apnea ,    Pulmonary exam normal breath sounds clear to auscultation       Cardiovascular Exercise Tolerance: Good hypertension, Normal cardiovascular exam Rhythm:Regular Rate:Normal     Neuro/Psych negative neurological ROS     GI/Hepatic GERD  ,IBS   Endo/Other  diabetes, Type 2  Renal/GU negative Renal ROS     Musculoskeletal  (+) Arthritis ,   Abdominal   Peds  Hematology negative hematology ROS (+)   Anesthesia Other Findings   Reproductive/Obstetrics                            Anesthesia Physical Anesthesia Plan  ASA: II  Anesthesia Plan: MAC   Post-op Pain Management:    Induction: Intravenous  PONV Risk Score and Plan: 1 and TIVA and Midazolam  Airway Management Planned: Natural Airway  Additional Equipment:   Intra-op Plan:   Post-operative Plan:   Informed Consent: I have reviewed the patients History and Physical, chart, labs and discussed the procedure including the risks, benefits and alternatives for the proposed anesthesia with the patient or authorized representative who has indicated his/her understanding and acceptance.     Plan Discussed with: CRNA  Anesthesia Plan Comments:        Anesthesia Quick Evaluation

## 2017-11-21 ENCOUNTER — Encounter
Admission: RE | Disposition: A | Discharge status: Home / Self Care | Payer: Self-pay | Source: Ambulatory Visit | Attending: Ophthalmology

## 2017-11-21 ENCOUNTER — Ambulatory Visit: Payer: Medicare Other | Admitting: Anesthesiology

## 2017-11-21 ENCOUNTER — Ambulatory Visit
Admission: RE | Admit: 2017-11-21 | Discharge: 2017-11-21 | Disposition: A | Discharge status: Home / Self Care | Payer: Medicare Other | Source: Ambulatory Visit | Attending: Ophthalmology | Admitting: Ophthalmology

## 2017-11-21 DIAGNOSIS — E1136 Type 2 diabetes mellitus with diabetic cataract: Secondary | ICD-10-CM | POA: Diagnosis not present

## 2017-11-21 DIAGNOSIS — H401122 Primary open-angle glaucoma, left eye, moderate stage: Secondary | ICD-10-CM | POA: Diagnosis not present

## 2017-11-21 DIAGNOSIS — M1990 Unspecified osteoarthritis, unspecified site: Secondary | ICD-10-CM | POA: Diagnosis not present

## 2017-11-21 DIAGNOSIS — G4730 Sleep apnea, unspecified: Secondary | ICD-10-CM | POA: Diagnosis not present

## 2017-11-21 DIAGNOSIS — K219 Gastro-esophageal reflux disease without esophagitis: Secondary | ICD-10-CM | POA: Diagnosis not present

## 2017-11-21 DIAGNOSIS — H2512 Age-related nuclear cataract, left eye: Secondary | ICD-10-CM | POA: Insufficient documentation

## 2017-11-21 DIAGNOSIS — K589 Irritable bowel syndrome without diarrhea: Secondary | ICD-10-CM | POA: Diagnosis not present

## 2017-11-21 DIAGNOSIS — I10 Essential (primary) hypertension: Secondary | ICD-10-CM | POA: Diagnosis not present

## 2017-11-21 HISTORY — PX: EYE SURGERY: SHX253

## 2017-11-21 HISTORY — PX: CATARACT EXTRACTION W/PHACO: SHX586

## 2017-11-21 HISTORY — PX: INSERTION OF ANTERIOR SEGMENT AQUEOUS DRAINAGE DEVICE (ISTENT): SHX6783

## 2017-11-21 LAB — GLUCOSE, CAPILLARY
Glucose-Capillary: 174 mg/dL — ABNORMAL HIGH (ref 70–99)
Glucose-Capillary: 169 mg/dL — ABNORMAL HIGH (ref 70–99)

## 2017-11-21 SURGERY — PHACOEMULSIFICATION, CATARACT, WITH IOL INSERTION
Anesthesia: Monitor Anesthesia Care | Laterality: Left

## 2017-11-21 MED ORDER — ACETAMINOPHEN 160 MG/5ML PO SOLN: 325.0000 mg | ORAL | Status: AC | PRN

## 2017-11-21 MED ORDER — FENTANYL CITRATE (PF) 100 MCG/2ML IJ SOLN
INTRAMUSCULAR | Status: DC | PRN
  Administered 2017-11-21: 50 ug via INTRAVENOUS

## 2017-11-21 MED ORDER — BRIMONIDINE TARTRATE-TIMOLOL 0.2-0.5 % OP SOLN
OPHTHALMIC | Status: DC | PRN
  Administered 2017-11-21: 1 [drp] via OPHTHALMIC

## 2017-11-21 MED ORDER — LACTATED RINGERS IV SOLN: INTRAVENOUS | Status: DC

## 2017-11-21 MED ORDER — LIDOCAINE HCL (PF) 2 % IJ SOLN
INTRAOCULAR | Status: DC | PRN
  Administered 2017-11-21: 1 mL via INTRAMUSCULAR

## 2017-11-21 MED ORDER — CEFUROXIME OPHTHALMIC INJECTION 1 MG/0.1 ML
INJECTION | OPHTHALMIC | Status: DC | PRN
  Administered 2017-11-21: 0.1 mL via INTRACAMERAL

## 2017-11-21 MED ORDER — TETRACAINE HCL 0.5 % OP SOLN
1.0000 [drp] | OPHTHALMIC | Status: DC | PRN
  Administered 2017-11-21 (×2): 1 [drp] via OPHTHALMIC

## 2017-11-21 MED ORDER — MOXIFLOXACIN HCL 0.5 % OP SOLN
1.0000 [drp] | OPHTHALMIC | Status: DC | PRN
  Administered 2017-11-21 (×3): 1 [drp] via OPHTHALMIC

## 2017-11-21 MED ORDER — ONDANSETRON HCL 4 MG/2ML IJ SOLN: 4.0000 mg | Freq: Once | INTRAMUSCULAR | Status: DC | PRN

## 2017-11-21 MED ORDER — ACETAMINOPHEN 325 MG PO TABS
650.0000 mg | ORAL_TABLET | Freq: Once | ORAL | Status: AC | PRN
  Administered 2017-11-21: 650 mg via ORAL

## 2017-11-21 MED ORDER — MIDAZOLAM HCL 2 MG/2ML IJ SOLN
INTRAMUSCULAR | Status: DC | PRN
  Administered 2017-11-21 (×2): 1 mg via INTRAVENOUS

## 2017-11-21 MED ORDER — NA HYALUR & NA CHOND-NA HYALUR 0.4-0.35 ML IO KIT
PACK | INTRAOCULAR | Status: DC | PRN
  Administered 2017-11-21: 1 mL via INTRAOCULAR

## 2017-11-21 MED ORDER — ARMC OPHTHALMIC DILATING DROPS
1.0000 "application " | OPHTHALMIC | Status: DC | PRN
  Administered 2017-11-21 (×3): 1 via OPHTHALMIC

## 2017-11-21 MED ORDER — EPINEPHRINE PF 1 MG/ML IJ SOLN
INTRAOCULAR | Status: DC | PRN
  Administered 2017-11-21: 74 mL via OPHTHALMIC

## 2017-11-21 SURGICAL SUPPLY — 31 items
CANNULA ANT/CHMB 27G (MISCELLANEOUS) ×1 IMPLANT
CANNULA ANT/CHMB 27GA (MISCELLANEOUS) ×2 IMPLANT
CARTRIDGE ABBOTT (MISCELLANEOUS) IMPLANT
DEVICE INJECT ISTENT W (Stent) ×1 IMPLANT
GLOVE SURG LX 7.5 STRW (GLOVE) ×1
GLOVE SURG LX STRL 7.5 STRW (GLOVE) ×1 IMPLANT
GLOVE SURG TRIUMPH 8.0 PF LTX (GLOVE) ×2 IMPLANT
GONIO PRISM W/BUILT IN CLIP (KITS) ×2
GONIOPRISM W/BUILT IN CLIP (KITS) IMPLANT
GOWN STRL REUS W/ TWL LRG LVL3 (GOWN DISPOSABLE) ×2 IMPLANT
GOWN STRL REUS W/TWL LRG LVL3 (GOWN DISPOSABLE) ×2
INJECT ISTENT W (Stent) ×2 IMPLANT
LENS IOL TECNIS ITEC 20.0 (Intraocular Lens) ×1 IMPLANT
MARKER SKIN DUAL TIP RULER LAB (MISCELLANEOUS) ×2 IMPLANT
NDL FILTER BLUNT 18X1 1/2 (NEEDLE) ×1 IMPLANT
NDL RETROBULBAR .5 NSTRL (NEEDLE) IMPLANT
NEEDLE FILTER BLUNT 18X 1/2SAF (NEEDLE) ×1
NEEDLE FILTER BLUNT 18X1 1/2 (NEEDLE) ×1 IMPLANT
PACK CATARACT BRASINGTON (MISCELLANEOUS) ×2 IMPLANT
PACK EYE AFTER SURG (MISCELLANEOUS) ×2 IMPLANT
PACK OPTHALMIC (MISCELLANEOUS) ×2 IMPLANT
RING MALYGIN 7.0 (MISCELLANEOUS) IMPLANT
SUT ETHILON 10-0 CS-B-6CS-B-6 (SUTURE)
SUT VICRYL  9 0 (SUTURE)
SUT VICRYL 9 0 (SUTURE) IMPLANT
SUTURE EHLN 10-0 CS-B-6CS-B-6 (SUTURE) IMPLANT
SYR 3ML LL SCALE MARK (SYRINGE) ×2 IMPLANT
SYR 5ML LL (SYRINGE) ×2 IMPLANT
SYR TB 1ML LUER SLIP (SYRINGE) ×2 IMPLANT
WATER STERILE IRR 500ML POUR (IV SOLUTION) ×2 IMPLANT
WIPE NON LINTING 3.25X3.25 (MISCELLANEOUS) ×2 IMPLANT

## 2017-11-21 NOTE — Anesthesia Procedure Notes (Signed)
Procedure Name: MAC Date/Time: 11/21/2017 8:55 AM Performed by: Cameron Ali, CRNA Pre-anesthesia Checklist: Patient identified, Emergency Drugs available, Suction available, Timeout performed and Patient being monitored Patient Re-evaluated:Patient Re-evaluated prior to induction Oxygen Delivery Method: Nasal cannula Placement Confirmation: positive ETCO2

## 2017-11-21 NOTE — H&P (Signed)
The History and Physical notes are on paper, have been signed, and are to be scanned. The patient remains stable and unchanged from the H&P.   Previous H&P reviewed, patient examined, and there are no changes.  Vernon Payne 11/21/2017 8:08 AM

## 2017-11-21 NOTE — Op Note (Addendum)
OPERATIVE NOTE  Vernon Payne [id number] 11/21/2017   PREOPERATIVE DIAGNOSIS:  Nuclear sclerotic cataract left eye. 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 Istent inject placement left eye  LENS:   Implant Name Type Inv. Item Serial No. Manufacturer Lot No. LRB No. Used  INJECT ISTENT - [id number] Stent INJECT ISTENT [id number] [employer]  Left 1  LENS IOL DIOP 20.0 - [id number] Intraocular Lens LENS IOL DIOP 20.0 [id number] AMO  Left 1        ULTRASOUND TIME: 14  % of 1 minutes 12 seconds, CDE 10.4  SURGEON:  Wyonia Hough, MD   ANESTHESIA:  Topical with tetracaine drops and 2% Xylocaine jelly, augmented with 1% preservative-free intracameral lidocaine.    COMPLICATIONS:  None.   DESCRIPTION OF PROCEDURE:  The patient was identified in the holding room and transported to the operating room and placed in the supine position under the operating microscope.  The left eye was identified as the operative eye and it was prepped and draped in the usual sterile ophthalmic fashion.   A 1 millimeter clear-corneal paracentesis was made at the 1:30 position.  0.5 ml of preservative-free 1% lidocaine was injected into the anterior chamber.  The anterior chamber was filled with Viscoat viscoelastic.  A 2.4 millimeter keratome was used to make a near-clear corneal incision at the 10:30 position. The microscope was adjusted and a gonioprism was used to visulaize the trabecular meshwork.  The istent injector was advanced across the anterior chamber under viscoelastic.  The first iStent inject was deployed into the trabecular meshwork at the 9:30 position.  The second iStent inject was placed two clock hours counterclockwise  into thje meshwork.  Proper postioning was confirmed.  A curvilinear capsulorrhexis was made with a cystotome and capsulorrhexis forceps.  Balanced salt solution was used to hydrodissect and hydrodelineate the nucleus.   Phacoemulsification was then used in stop and chop fashion to remove the lens nucleus and epinucleus.  The remaining cortex was then removed using the irrigation and aspiration handpiece. Provisc was then placed into the capsular bag to distend it for lens placement.  A lens was then injected into the capsular bag.  The remaining viscoelastic was aspirated.   Wounds were hydrated with balanced salt solution.  The anterior chamber was inflated to a physiologic pressure with balanced salt solution.  No wound leaks were noted. Cefuroxime 0.1 ml of a 10mg /ml solution was injected into the anterior chamber for a dose of 1 mg of intracameral antibiotic at the completion of the case.   Timolol and Brimonidine drops were applied to the eye.  The patient was taken to the recovery room in stable condition without complications of anesthesia or surgery.  [name] 11/21/2017, 9:24 AM

## 2017-11-21 NOTE — Anesthesia Postprocedure Evaluation (Signed)
Anesthesia Post Note  Patient: Vernon Payne.  Procedure(s) Performed: CATARACT EXTRACTION PHACO AND INTRAOCULAR LENS PLACEMENT (IOC) (Left ) INSERTION OF ANTERIOR SEGMENT AQUEOUS DRAINAGE DEVICE (ISTENT)  INJECT DIABETES ISTENT INJECT (Left )  Patient location during evaluation: PACU Anesthesia Type: MAC Level of consciousness: awake and alert, oriented and patient cooperative Pain management: pain level controlled Vital Signs Assessment: post-procedure vital signs reviewed and stable Respiratory status: spontaneous breathing, nonlabored ventilation and respiratory function stable Cardiovascular status: blood pressure returned to baseline and stable Postop Assessment: adequate PO intake Anesthetic complications: no    Darrin Nipper

## 2017-11-21 NOTE — Transfer of Care (Signed)
Immediate Anesthesia Transfer of Care Note  Patient: Vernon Payne.  Procedure(s) Performed: CATARACT EXTRACTION PHACO AND INTRAOCULAR LENS PLACEMENT (IOC) (Left ) INSERTION OF ANTERIOR SEGMENT AQUEOUS DRAINAGE DEVICE (ISTENT)  INJECT DIABETES ISTENT INJECT (Left )  Patient Location: PACU  Anesthesia Type: MAC  Level of Consciousness: awake, alert  and patient cooperative  Airway and Oxygen Therapy: Patient Spontanous Breathing and Patient connected to supplemental oxygen  Post-op Assessment: Post-op Vital signs reviewed, Patient's Cardiovascular Status Stable, Respiratory Function Stable, Patent Airway and No signs of Nausea or vomiting  Post-op Vital Signs: Reviewed and stable  Complications: No apparent anesthesia complications

## 2017-11-22 ENCOUNTER — Encounter: Payer: Self-pay | Admitting: Ophthalmology

## 2017-12-10 ENCOUNTER — Ambulatory Visit
Admission: RE | Admit: 2017-12-10 | Discharge: 2017-12-10 | Disposition: A | Discharge status: Home / Self Care | Payer: Medicare Other | Source: Ambulatory Visit | Attending: Student in an Organized Health Care Education/Training Program | Admitting: Student in an Organized Health Care Education/Training Program

## 2017-12-10 ENCOUNTER — Encounter: Payer: Self-pay | Admitting: Student in an Organized Health Care Education/Training Program

## 2017-12-10 ENCOUNTER — Ambulatory Visit (HOSPITAL_BASED_OUTPATIENT_CLINIC_OR_DEPARTMENT_OTHER): Payer: Medicare Other | Admitting: Student in an Organized Health Care Education/Training Program

## 2017-12-10 VITALS — BP 131/95 | HR 77 | Temp 98.3°F | Resp 16 | Ht 75.5 in | Wt 213.0 lb

## 2017-12-10 DIAGNOSIS — M170 Bilateral primary osteoarthritis of knee: Secondary | ICD-10-CM | POA: Diagnosis not present

## 2017-12-10 DIAGNOSIS — Z96652 Presence of left artificial knee joint: Secondary | ICD-10-CM

## 2017-12-10 DIAGNOSIS — Z885 Allergy status to narcotic agent status: Secondary | ICD-10-CM | POA: Insufficient documentation

## 2017-12-10 DIAGNOSIS — Z7982 Long term (current) use of aspirin: Secondary | ICD-10-CM | POA: Insufficient documentation

## 2017-12-10 DIAGNOSIS — Z79899 Other long term (current) drug therapy: Secondary | ICD-10-CM | POA: Insufficient documentation

## 2017-12-10 DIAGNOSIS — M1712 Unilateral primary osteoarthritis, left knee: Secondary | ICD-10-CM

## 2017-12-10 DIAGNOSIS — Z7984 Long term (current) use of oral hypoglycemic drugs: Secondary | ICD-10-CM | POA: Insufficient documentation

## 2017-12-10 DIAGNOSIS — G894 Chronic pain syndrome: Secondary | ICD-10-CM | POA: Diagnosis present

## 2017-12-10 DIAGNOSIS — M1711 Unilateral primary osteoarthritis, right knee: Secondary | ICD-10-CM

## 2017-12-10 MED ORDER — ROPIVACAINE HCL 2 MG/ML IJ SOLN
10.0000 mL | Freq: Once | INTRAMUSCULAR | Status: AC
  Administered 2017-12-10: 10 mL
  Filled 2017-12-10: qty 10

## 2017-12-10 MED ORDER — DIAZEPAM 5 MG PO TABS
5.0000 mg | ORAL_TABLET | Freq: Once | ORAL | Status: AC
  Administered 2017-12-10: 5 mg via ORAL

## 2017-12-10 MED ORDER — DEXAMETHASONE SODIUM PHOSPHATE 10 MG/ML IJ SOLN
10.0000 mg | Freq: Once | INTRAMUSCULAR | Status: AC
  Administered 2017-12-10: 10 mg
  Filled 2017-12-10: qty 1

## 2017-12-10 MED ORDER — DIAZEPAM 5 MG PO TABS
ORAL_TABLET | ORAL | Status: AC
  Filled 2017-12-10: qty 1

## 2017-12-10 MED ORDER — LIDOCAINE HCL 2 % IJ SOLN
20.0000 mL | Freq: Once | INTRAMUSCULAR | Status: AC
  Administered 2017-12-10: 400 mg
  Filled 2017-12-10: qty 20

## 2017-12-10 MED ORDER — LIDOCAINE HCL 2 % IJ SOLN: 20.0000 mL | Freq: Once | INTRAMUSCULAR | Status: DC

## 2017-12-10 MED ORDER — LACTATED RINGERS IV SOLN: 1000.0000 mL | Freq: Once | INTRAVENOUS | Status: DC

## 2017-12-10 MED ORDER — FENTANYL CITRATE (PF) 100 MCG/2ML IJ SOLN
25.0000 ug | INTRAMUSCULAR | Status: DC | PRN
  Filled 2017-12-10: qty 2

## 2017-12-10 MED ORDER — DEXAMETHASONE SODIUM PHOSPHATE 10 MG/ML IJ SOLN: 10.0000 mg | Freq: Once | INTRAMUSCULAR | Status: DC

## 2017-12-10 MED ORDER — DEXAMETHASONE SODIUM PHOSPHATE 4 MG/ML IJ SOLN
10.0000 mg | Freq: Once | INTRAMUSCULAR | Status: DC
  Filled 2017-12-10: qty 2.5

## 2017-12-10 NOTE — Progress Notes (Signed)
Safety precautions to be maintained throughout the outpatient stay will include: orient to surroundings, keep bed in low position, maintain call bell within reach at all times, provide assistance with transfer out of bed and ambulation.  

## 2017-12-10 NOTE — Patient Instructions (Addendum)
____________________________________________________________________________________________  Post-procedure Information What to expect: Most procedures involve the use of a local anesthetic (numbing medicine), and a steroid (anti-inflammatory medicine).  The local anesthetics may cause temporary numbness and weakness of the legs or arms, depending on the location of the block. This numbness/weakness may last 4-6 hours, depending on the local anesthetic used. In rare instances, it can last up to 24 hours. While numb, you must be very careful not to injure the extremity.  After any procedure, you could expect the pain to get better within 15-20 minutes. This relief is temporary and may last 4-6 hours. Once the local anesthetics wears off, you could experience discomfort, possibly more than usual, for up to 10 (ten) days. In the case of radiofrequencies, it may last up to 6 weeks. Surgeries may take up to 8 weeks for the healing process. The discomfort is due to the irritation caused by needles going through skin and muscle. To minimize the discomfort, we recommend using ice the first day, and heat from then on. The ice should be applied for 15 minutes on, and 15 minutes off. Keep repeating this cycle until bedtime. Avoid applying the ice directly to the skin, to prevent frostbite. Heat should be used daily, until the pain improves (4-10 days). Be careful not to burn yourself.  Occasionally you may experience muscle spasms or cramps. These occur as a consequence of the irritation caused by the needle sticks to the muscle and the blood that will inevitably be lost into the surrounding muscle tissue. Blood tends to be very irritating to tissues, which tend to react by going into spasm. These spasms may start the same day of your procedure, but they may also take days to develop. This late onset type of spasm or cramp is usually caused by electrolyte imbalances triggered by the steroids, at the level of the  kidney. Cramps and spasms tend to respond well to muscle relaxants, multivitamins (some are triggered by the procedure, but may have their origins in vitamin deficiencies), and "Gatorade", or any sports drinks that can replenish any electrolyte imbalances. (If you are a diabetic, ask your pharmacist to get you a sugar-free brand.) Warm showers or baths may also be helpful. Stretching exercises are highly recommended.  General Instructions:  Be alert for signs of possible infection: redness, swelling, heat, red streaks, elevated temperature, and/or fever. These typically appear 4 to 6 days after the procedure. Immediately notify your doctor if you experience unusual bleeding, difficulty breathing, or loss of bowel or bladder control. If you experience increased pain, do not increase your pain medicine intake, unless instructed by your pain physician.  Post-Procedure Care:  Be careful in moving about. Muscle spasms in the area of the injection may occur. Applying ice or heat to the area is often helpful. The incidence of spinal headaches after epidural injections ranges between 1.4% and 6%. If you develop a headache that does not seem to respond to conservative therapy, please let your physician know. This can be treated with an epidural blood patch.   Post-procedure numbness or redness is to be expected, however it should average 4 to 6 hours. If numbness and weakness of your extremities begins to develop 4 to 6 hours after your procedure, and is felt to be progressing and worsening, immediately contact your physician.  Diet:  If you experience nausea, do not eat until this sensation goes away. If you had a "Stellate Ganglion Block" for upper extremity "Reflex Sympathetic Dystrophy", do not eat or   drink until your hoarseness goes away. In any case, always start with liquids first and if you tolerate them well, then slowly progress to more solid foods.  Activity:  For the first 4 to 6 hours after the  procedure, use caution in moving about as you may experience numbness and/or weakness. Use caution in cooking, using household electrical appliances, and climbing steps. If you need to reach your Doctor call our office: [phone] (During business hours) or [phone] (After business hours).  Business Hours: Monday-Thursday 8:00 am - 4:00 PM    Fridays: Closed     In case of an emergency: In case of emergency, call 911 or go to the nearest emergency room and have the physician there call us.  Interpretation of Procedure Every nerve block has two components: a diagnostic component, and a treatment component. Unrealistic expectations are the most common causes of "perceived failure".  In a perfect world, a single nerve block should be able to completely and permanently eliminate the pain. Sadly, the world is not perfect.  Most pain management nerve blocks are performed using local anesthetics and steroids. Steroids are responsible for any long-term benefit that you may experience. Their purpose is to decrease any chronic swelling that may exist in the area. Steroids begin to work immediately after being injected. However, most patients will not experience any benefits until 5 to 10 days after the injection, when the swelling has come down to the point where they can tell a difference. Steroids will only help if there is swelling to be treated. As such, they can assist with the diagnosis. If effective, they suggest an inflammatory component to the pain, and if ineffective, they rule out inflammation as the main cause or component of the problem. If the problem is one of mechanical compression, you will get no benefit from those steroids.   In the case of local anesthetics, they have a crucial role in the diagnosis of your condition. Most will begin to work within15 to 20 minutes after injection. The duration will depend on the type used (short- vs. Long-acting). It is of outmost importance that  patients keep tract of their pain, after the procedure. To assist with this matter, a "Post-procedure Pain Diary" is provided. Make sure to complete it and to bring it back to your follow-up appointment.  As long as the patient keeps accurate, detailed records of their symptoms after every procedure, and returns to have those interpreted, every procedure will provide us with invaluable information. Even a block that does not provide the patient with any relief, will always provide us with information about the mechanism and the origin of the pain. The only time a nerve block can be considered a waste of time is when patients do not keep track of the results, or do not keep their post-procedure appointment.  Reporting the results back to your physician The Pain Score  Pain is a subjective complaint. It cannot be seen, touched, or measured. We depend entirely on the patient's report of the pain in order to assess your condition and treatment. To evaluate the pain, we use a pain scale, where "0" means "No Pain", and a "10" is "the worst possible pain that you can even imagine" (i.e. something like been eaten alive by a shark or being torn apart by a lion).   Use the Pain Scale provided. You will frequently be asked to rate your pain. Please be accurate, remember that medical decisions will be based on your   responses. Please do not rate your pain above a 10. Doing so is actually interpreted as "symptom magnification" (exaggeration). To put this into perspective, when you tell us that your pain is at a 10 (ten), what you are saying is that there is nothing we can do to make this pain any worse. (Carefully think about that.) ____________________________________________________________________________________________   Pain Management Discharge Instructions  General Discharge Instructions :  If you need to reach your doctor call: Monday-Friday 8:00 am - 4:00 pm at [phone] or toll free [phone].   After clinic hours [phone] to have operator reach doctor.  Bring all of your medication bottles to all your appointments in the pain clinic.  To cancel or reschedule your appointment with Pain Management please remember to call 24 hours in advance to avoid a fee.  Refer to the educational materials which you have been given on: General Risks, I had my Procedure. Discharge Instructions, Post Sedation.  Post Procedure Instructions:  The drugs you were given will stay in your system until tomorrow, so for the next 24 hours you should not drive, make any legal decisions or drink any alcoholic beverages.  Please notify your doctor immediately if you have any unusual bleeding, trouble breathing or pain that is not related to your normal pain.  Depending on the type of procedure that was done, some parts of your body may feel week and/or numb.  This usually clears up by tonight or the next day.  Walk with the use of an assistive device or accompanied by an adult for the 24 hours.  You may use ice on the affected area for the first 24 hours.  Put ice in a Ziploc bag and cover with a towel and place against area 15 minutes on 15 minutes off.  You may switch to heat after 24 hours.

## 2017-12-10 NOTE — Progress Notes (Signed)
Patient's Name: Vernon Payne.  MRN: [id number]  Referring Provider: Rusty Aus, MD  DOB: 12-17-1950  PCP: Rusty Aus, MD  DOS: 12/10/2017  Note by: Gillis Santa, MD  Service setting: Ambulatory outpatient  Specialty: Interventional Pain Management  Patient type: Established  Location: [facility] (AMB) Pain Management Facility  Visit type: Interventional Procedure   Primary Reason for Visit: Interventional Pain Management Treatment. CC: Knee Pain (bilateral )  Procedure:  Anesthesia, Analgesia, Anxiolysis:  Type: Genicular Nerves Block (Superior-lateral, Superior-medial, and Inferior-medial Genicular Nerves) #2  CPT: 24580      Primary Purpose: Therapeutic Region: Lateral, Anterior, and Medial aspects of the knee joint, above and below the knee joint proper. Level: Superior and inferior to the knee joint. Target Area: For Genicular Nerve block(s), the targets are: the superior-lateral genicular nerve, located in the lateral distal portion of the femoral shaft as it curves to form the lateral epicondyle, in the region of the distal femoral metaphysis; the superior-medial genicular nerve, located in the medial distal portion of the femoral shaft as it curves to form the medial epicondyle; and the inferior-medial genicular nerve, located in the medial, proximal portion of the tibial shaft, as it curves to form the medial epicondyle, in the region of the proximal tibial metaphysis. Approach: Anterior, percutaneous, ipsilateral approach. Laterality: Bilateral Position: Modified Fowler's position with pillows under the targeted knee(s).  Type: Moderate (Conscious) Sedation combined with Local Anesthesia Indication(s): Analgesia and Anxiety Route: Infiltration ([place]/IM) IV Access: Declined Sedation: Meaningful verbal contact was maintained at all times during the procedure  Local Anesthetic: Lidocaine 1-2%   Indications: 1. Primary osteoarthritis of right knee   2. Primary osteoarthritis of  left knee   3. Status post left partial knee replacement   4. Chronic pain syndrome    Pain Score: Pre-procedure: 2 /10 Post-procedure: 0-No pain/10  Pre-op Assessment:  Vernon Payne is a 67 y.o. (year old), male patient, seen today for interventional treatment. He  has a past surgical history that includes Appendectomy; Tonsillectomy; Joint replacement; Rotator cuff repair; LASIK; Back surgery; Cataract extraction w/PHACO (Right, 08/29/2017); Insertion of anterior segment aqueous drainage device (ISTENT) (Right, 08/29/2017); Cataract extraction w/PHACO (Left, 11/21/2017); Insertion of anterior segment aqueous drainage device (ISTENT) (Left, 11/21/2017); and Eye surgery (Left, 11/21/2017). Vernon Payne has a current medication list which includes the following prescription(s): amlodipine, aspirin ec, diphenhydramine-acetaminophen, dorzolamide, glimepiride, latanoprost, lisinopril, metformin, multi-vitamins, probiotic product, and omeprazole, and the following Facility-Administered Medications: dexamethasone, dexamethasone, fentanyl, lactated ringers, and lidocaine. His primarily concern today is the Knee Pain (bilateral )  Initial Vital Signs:  Pulse/HCG Rate: 77ECG Heart Rate: 77 Temp: 98.3 F (36.8 C) Resp: 16 BP: 131/78 SpO2: 97 %  BMI: Estimated body mass index is 26.27 kg/m as calculated from the following:   Height as of this encounter: 6' 3.5" (1.918 m).   Weight as of this encounter: 213 lb (96.6 kg).  Risk Assessment: Allergies: Reviewed. He is allergic to morphine and related.  Allergy Precautions: None required Coagulopathies: Reviewed. None identified.  Blood-thinner therapy: None at this time Active Infection(s): Reviewed. None identified. Vernon Payne is afebrile  Site Confirmation: Vernon Payne was asked to confirm the procedure and laterality before marking the site Procedure checklist: Completed Consent: Before the procedure and under the influence of no sedative(s), amnesic(s), or  anxiolytics, the patient was informed of the treatment options, risks and possible complications. To fulfill our ethical and legal obligations, as recommended by the American Medical Association's Code of Ethics, I  have informed the patient of my clinical impression; the nature and purpose of the treatment or procedure; the risks, benefits, and possible complications of the intervention; the alternatives, including doing nothing; the risk(s) and benefit(s) of the alternative treatment(s) or procedure(s); and the risk(s) and benefit(s) of doing nothing. The patient was provided information about the general risks and possible complications associated with the procedure. These may include, but are not limited to: failure to achieve desired goals, infection, bleeding, organ or nerve damage, allergic reactions, paralysis, and death. In addition, the patient was informed of those risks and complications associated to the procedure, such as failure to decrease pain; infection; bleeding; organ or nerve damage with subsequent damage to sensory, motor, and/or autonomic systems, resulting in permanent pain, numbness, and/or weakness of one or several areas of the body; allergic reactions; (i.e.: anaphylactic reaction); and/or death. Furthermore, the patient was informed of those risks and complications associated with the medications. These include, but are not limited to: allergic reactions (i.e.: anaphylactic or anaphylactoid reaction(s)); adrenal axis suppression; blood sugar elevation that in diabetics may result in ketoacidosis or comma; water retention that in patients with history of congestive heart failure may result in shortness of breath, pulmonary edema, and decompensation with resultant heart failure; weight gain; swelling or edema; medication-induced neural toxicity; particulate matter embolism and blood vessel occlusion with resultant organ, and/or nervous system infarction; and/or aseptic necrosis of one or  more joints. Finally, the patient was informed that Medicine is not an exact science; therefore, there is also the possibility of unforeseen or unpredictable risks and/or possible complications that may result in a catastrophic outcome. The patient indicated having understood very clearly. We have given the patient no guarantees and we have made no promises. Enough time was given to the patient to ask questions, all of which were answered to the patient's satisfaction. Mr. Jobe has indicated that he wanted to continue with the procedure. Attestation: I, the ordering provider, attest that I have discussed with the patient the benefits, risks, side-effects, alternatives, likelihood of achieving goals, and potential problems during recovery for the procedure that I have provided informed consent. Date  Time:   Pre-Procedure Preparation:  Monitoring: As per clinic protocol. Respiration, ETCO2, SpO2, BP, heart rate and rhythm monitor placed and checked for adequate function Safety Precautions: Patient was assessed for positional comfort and pressure points before starting the procedure. Time-out: I initiated and conducted the "Time-out" before starting the procedure, as per protocol. The patient was asked to participate by confirming the accuracy of the "Time Out" information. Verification of the correct person, site, and procedure were performed and confirmed by me, the nursing staff, and the patient. "Time-out" conducted as per Joint Commission's Universal Protocol (UP.01.01.01). Time: 1009  Description of Procedure Process:  Area Prepped: Entire knee area, from mid-thigh to mid-shin, lateral, anterior, and medial aspects. Prepping solution: ChloraPrep (2% chlorhexidine gluconate and 70% isopropyl alcohol) Safety Precautions: Aspiration looking for blood return was conducted prior to all injections. At no point did we inject any substances, as a needle was being advanced. No attempts were made at seeking  any paresthesias. Safe injection practices and needle disposal techniques used. Medications properly checked for expiration dates. SDV (single dose vial) medications used. Latex Allergy precautions taken.   Description of the Procedure: Protocol guidelines were followed. The patient was placed in position over the procedure table. The target area was identified and the area prepped in the usual manner. Skin desensitized using vapocoolant spray. Skin & deeper  tissues infiltrated with local anesthetic. Appropriate amount of time allowed to pass for local anesthetics to take effect. The procedure needles were then advanced to the target area. Proper needle placement secured. Negative aspiration confirmed. Solution injected in intermittent fashion, asking for systemic symptoms every 0.5cc of injectate. The needles were then removed and the area cleansed, making sure to leave some of the prepping solution back to take advantage of its long term bactericidal properties.  Vitals:   12/10/17 1013 12/10/17 1018 12/10/17 1023 12/10/17 1031  BP: 136/84 136/85 125/87 (!) 131/95  Pulse:      Resp: 19 16 12 16   Temp:      TempSrc:      SpO2: 98% 94% 99% 97%  Weight:      Height:        Start Time: 1009 hrs. End Time: 1026 hrs. Materials:  Needle(s) Type: Regular needle Gauge: 22G Length: 3.5-in Medication(s): Please see orders for medications and dosing details. 9 cc solution made of 7 cc of 0.2% ropivacaine, 2 cc of Decadron, 10 mg/cc.  1.5 cc injected at each level above, bilaterally. 10 mg of Decadron used per each knee Imaging Guidance (Non-Spinal):  Type of Imaging Technique: Fluoroscopy Guidance (Non-Spinal) Indication(s): Assistance in needle guidance and placement for procedures requiring needle placement in or near specific anatomical locations not easily accessible without such assistance. Exposure Time: Please see nurses notes. Contrast: Before injecting any contrast, we confirmed that the  patient did not have an allergy to iodine, shellfish, or radiological contrast. Once satisfactory needle placement was completed at the desired level, radiological contrast was injected. Contrast injected under live fluoroscopy. No contrast complications. See chart for type and volume of contrast used. Fluoroscopic Guidance: I was personally present during the use of fluoroscopy. "Tunnel Vision Technique" used to obtain the best possible view of the target area. Parallax error corrected before commencing the procedure. "Direction-depth-direction" technique used to introduce the needle under continuous pulsed fluoroscopy. Once target was reached, antero-posterior, oblique, and lateral fluoroscopic projection used confirm needle placement in all planes. Images permanently stored in EMR. Interpretation: I personally interpreted the imaging intraoperatively. Adequate needle placement confirmed in multiple planes. Appropriate spread of contrast into desired area was observed. No evidence of afferent or efferent intravascular uptake. Permanent images saved into the patient's record.  Antibiotic Prophylaxis:   Anti-infectives (From admission, onward)   None     Indication(s): None identified  Post-operative Assessment:  Post-procedure Vital Signs:  Pulse/HCG Rate: 7777 Temp: 98.3 F (36.8 C) Resp: 16 BP: (!) 131/95 SpO2: 97 %  EBL: None  Complications: No immediate post-treatment complications observed by team, or reported by patient.  Note: The patient tolerated the entire procedure well. A repeat set of vitals were taken after the procedure and the patient was kept under observation following institutional policy, for this type of procedure. Post-procedural neurological assessment was performed, showing return to baseline, prior to discharge. The patient was provided with post-procedure discharge instructions, including a section on how to identify potential problems. Should any problems arise  concerning this procedure, the patient was given instructions to immediately contact us, at any time, without hesitation. In any case, we plan to contact the patient by telephone for a follow-up status report regarding this interventional procedure.  Comments:  No additional relevant information. 5 out of 5 strength bilateral lower extremity: Plantar flexion, dorsiflexion, knee flexion, knee extension.  Plan of Care    Imaging Orders     DG C-Arm 1-60 Min-No  Report Procedure Orders    No procedure(s) ordered today    Medications ordered for procedure: Meds ordered this encounter  Medications  . lactated ringers infusion 1,000 mL  . fentaNYL (SUBLIMAZE) injection 25-100 mcg    Make sure Narcan is available in the pyxis when using this medication. In the event of respiratory depression (RR< 8/min): Titrate NARCAN (naloxone) in increments of 0.1 to 0.2 mg IV at 2-3 minute intervals, until desired degree of reversal.  . ropivacaine (PF) 2 mg/mL (0.2%) (NAROPIN) injection 10 mL  . lidocaine (XYLOCAINE) 2 % (with pres) injection 400 mg  . dexamethasone (DECADRON) injection 10 mg  . dexamethasone (DECADRON) injection 10 mg  . ropivacaine (PF) 2 mg/mL (0.2%) (NAROPIN) injection 10 mL  . lidocaine (XYLOCAINE) 2 % (with pres) injection 400 mg  . dexamethasone (DECADRON) injection 10 mg  . dexamethasone (DECADRON) injection 10 mg  . diazepam (VALIUM) tablet 5 mg   Medications administered: We administered ropivacaine (PF) 2 mg/mL (0.2%), lidocaine, dexamethasone, ropivacaine (PF) 2 mg/mL (0.2%), dexamethasone, and diazepam.  See the medical record for exact dosing, route, and time of administration.  New Prescriptions   No medications on file   Disposition: Discharge home  Discharge Date & Time: 12/10/2017; 1035 hrs.   Physician-requested Follow-up: Return in about 6 weeks (around 01/21/2018) for Post Procedure Evaluation.  Future Appointments  Date Time Provider [place]    01/22/2018  8:45 AM Gillis Santa, MD [facility] None   Primary Care Physician: Rusty Aus, MD Location: [facility] Note by: Gillis Santa, MD Date: 12/10/2017; Time: 10:37 AM  Disclaimer:  Medicine is not an exact science. The only guarantee in medicine is that nothing is guaranteed. It is important to note that the decision to proceed with this intervention was based on the information collected from the patient. The Data and conclusions were drawn from the patient's questionnaire, the interview, and the physical examination. Because the information was provided in large part by the patient, it cannot be guaranteed that it has not been purposely or unconsciously manipulated. Every effort has been made to obtain as much relevant data as possible for this evaluation. It is important to note that the conclusions that lead to this procedure are derived in large part from the available data. Always take into account that the treatment will also be dependent on availability of resources and existing treatment guidelines, considered by other Pain Management Practitioners as being common knowledge and practice, at the time of the intervention. For Medico-Legal purposes, it is also important to point out that variation in procedural techniques and pharmacological choices are the acceptable norm. The indications, contraindications, technique, and results of the above procedure should only be interpreted and judged by a Board-Certified Interventional Pain Specialist with extensive familiarity and expertise in the same exact procedure and technique.

## 2017-12-11 ENCOUNTER — Telehealth: Payer: Self-pay | Admitting: *Deleted

## 2017-12-11 NOTE — Telephone Encounter (Signed)
Spoke with wife, no problems post procedure.

## 2018-01-22 ENCOUNTER — Encounter: Payer: Self-pay | Admitting: Student in an Organized Health Care Education/Training Program

## 2018-01-22 ENCOUNTER — Ambulatory Visit
Payer: Medicare Other | Attending: Student in an Organized Health Care Education/Training Program | Admitting: Student in an Organized Health Care Education/Training Program

## 2018-01-22 ENCOUNTER — Other Ambulatory Visit: Payer: Self-pay

## 2018-01-22 VITALS — Ht 76.0 in | Wt 221.0 lb

## 2018-01-22 DIAGNOSIS — H9190 Unspecified hearing loss, unspecified ear: Secondary | ICD-10-CM | POA: Insufficient documentation

## 2018-01-22 DIAGNOSIS — E119 Type 2 diabetes mellitus without complications: Secondary | ICD-10-CM | POA: Diagnosis not present

## 2018-01-22 DIAGNOSIS — M1712 Unilateral primary osteoarthritis, left knee: Secondary | ICD-10-CM | POA: Diagnosis not present

## 2018-01-22 DIAGNOSIS — G4730 Sleep apnea, unspecified: Secondary | ICD-10-CM | POA: Insufficient documentation

## 2018-01-22 DIAGNOSIS — M1711 Unilateral primary osteoarthritis, right knee: Secondary | ICD-10-CM | POA: Diagnosis not present

## 2018-01-22 DIAGNOSIS — I10 Essential (primary) hypertension: Secondary | ICD-10-CM | POA: Insufficient documentation

## 2018-01-22 DIAGNOSIS — Z833 Family history of diabetes mellitus: Secondary | ICD-10-CM | POA: Diagnosis not present

## 2018-01-22 DIAGNOSIS — Z79899 Other long term (current) drug therapy: Secondary | ICD-10-CM | POA: Diagnosis not present

## 2018-01-22 DIAGNOSIS — M170 Bilateral primary osteoarthritis of knee: Secondary | ICD-10-CM | POA: Insufficient documentation

## 2018-01-22 DIAGNOSIS — K589 Irritable bowel syndrome without diarrhea: Secondary | ICD-10-CM | POA: Diagnosis not present

## 2018-01-22 DIAGNOSIS — K219 Gastro-esophageal reflux disease without esophagitis: Secondary | ICD-10-CM | POA: Insufficient documentation

## 2018-01-22 DIAGNOSIS — Z7984 Long term (current) use of oral hypoglycemic drugs: Secondary | ICD-10-CM | POA: Insufficient documentation

## 2018-01-22 DIAGNOSIS — M25561 Pain in right knee: Secondary | ICD-10-CM | POA: Diagnosis present

## 2018-01-22 DIAGNOSIS — E785 Hyperlipidemia, unspecified: Secondary | ICD-10-CM | POA: Diagnosis not present

## 2018-01-22 DIAGNOSIS — Z7982 Long term (current) use of aspirin: Secondary | ICD-10-CM | POA: Diagnosis not present

## 2018-01-22 DIAGNOSIS — Z96652 Presence of left artificial knee joint: Secondary | ICD-10-CM | POA: Diagnosis not present

## 2018-01-22 DIAGNOSIS — Z885 Allergy status to narcotic agent status: Secondary | ICD-10-CM | POA: Diagnosis not present

## 2018-01-22 NOTE — Progress Notes (Signed)
Patient's Name: Vernon Payne.  MRN: [id number]  Referring Provider: Rusty Aus, MD  DOB: [birth year]  PCP: Rusty Aus, MD  DOS: 01/22/2018  Note by: Gillis Santa, MD  Service setting: Ambulatory outpatient  Specialty: Interventional Pain Management  Location: [facility] (AMB) Pain Management Facility    Patient type: Established   Primary Reason(s) for Visit: Encounter for post-procedure evaluation of chronic illness with mild to moderate exacerbation CC: Knee Pain (bilateral)  HPI  Vernon Payne is a 67 y.o. year old, male patient, who comes today for a post-procedure evaluation. He has Yellow jacket sting; HTN (hypertension); Diabetes ([place]); HLD (hyperlipidemia); Primary osteoarthritis of right knee; Primary osteoarthritis of left knee; and Status post left partial knee replacement on their problem list. His primarily concern today is the Knee Pain (bilateral)  Pain Assessment: Location: Right, Left Knee Radiating: denies Onset: More than a month ago Duration: Chronic pain Quality: Discomfort, Dull, Constant Severity: 1 /10 (subjective, self-reported pain score)  Note: Reported level is compatible with observation.                         When using our objective Pain Scale, levels between 6 and 10/10 are said to belong in an emergency room, as it progressively worsens from a 6/10, described as severely limiting, requiring emergency care not usually available at an outpatient pain management facility. At a 6/10 level, communication becomes difficult and requires great effort. Assistance to reach the emergency department may be required. Facial flushing and profuse sweating along with potentially dangerous increases in heart rate and blood pressure will be evident. Effect on ADL:   Timing: Constant Modifying factors: nothing  BP:    HR:    Vernon Payne comes in today for post-procedure evaluation.  Further details on both, my assessment(s), as well as the proposed treatment plan, please see  below.  Post-Procedure Assessment  12/10/2017 Procedure: Bilateral genicular nerve block Pre-procedure pain score:  2/10 Post-procedure pain score: 0/10         Influential Factors: BMI: 26.90 kg/m Intra-procedural challenges: None observed.         Assessment challenges: None detected.              Reported side-effects: None.        Post-procedural adverse reactions or complications: None reported         Sedation: Please see nurses note. When no sedatives are used, the analgesic levels obtained are directly associated to the effectiveness of the local anesthetics. However, when sedation is provided, the level of analgesia obtained during the initial 1 hour following the intervention, is believed to be the result of a combination of factors. These factors may include, but are not limited to: 1. The effectiveness of the local anesthetics used. 2. The effects of the analgesic(s) and/or anxiolytic(s) used. 3. The degree of discomfort experienced by the patient at the time of the procedure. 4. The patients ability and reliability in recalling and recording the events. 5. The presence and influence of possible secondary gains and/or psychosocial factors. Reported result: Relief experienced during the 1st hour after the procedure: 100 % (Ultra-Short Term Relief)            Interpretative annotation: Clinically appropriate result. Analgesia during this period is likely to be Local Anesthetic and/or IV Sedative (Analgesic/Anxiolytic) related.          Effects of local anesthetic: The analgesic effects attained during this period are directly  associated to the localized infiltration of local anesthetics and therefore cary significant diagnostic value as to the etiological location, or anatomical origin, of the pain. Expected duration of relief is directly dependent on the pharmacodynamics of the local anesthetic used. Long-acting (4-6 hours) anesthetics used.  Reported result: Relief during the next  4 to 6 hour after the procedure: 100 % (Short-Term Relief)            Interpretative annotation: Clinically appropriate result. Analgesia during this period is likely to be Local Anesthetic-related.          Long-term benefit: Defined as the period of time past the expected duration of local anesthetics (1 hour for short-acting and 4-6 hours for long-acting). With the possible exception of prolonged sympathetic blockade from the local anesthetics, benefits during this period are typically attributed to, or associated with, other factors such as analgesic sensory neuropraxia, antiinflammatory effects, or beneficial biochemical changes provided by agents other than the local anesthetics.  Reported result: Extended relief following procedure: 85 %(ongoing) (Long-Term Relief)            Interpretative annotation: Clinically possible results. Good relief. No permanent benefit expected. Inflammation plays a part in the etiology to the pain.          Current benefits: Defined as reported results that persistent at this point in time.   Analgesia: >75 %            Function: Vernon Payne reports improvement in function ROM: Vernon Payne reports improvement in ROM Interpretative annotation: Clinically significant results. Therapeutic benefit observed. Effective therapeutic approach.          Interpretation: Results would suggest a successful diagnostic and therapeutic intervention.                  Plan:  Set up procedure as a PRN palliative treatment option for this patient.                Laboratory Chemistry  Inflammation Markers (CRP: Acute Phase) (ESR: Chronic Phase) No results found for: CRP, ESRSEDRATE, LATICACIDVEN                       Rheumatology Markers No results found for: RF, ANA, LABURIC, URICUR, LYMEIGGIGMAB, LYMEABIGMQN, HLAB27                      Renal Function Markers Lab Results  Component Value Date   BUN 15 10/30/2016   CREATININE 0.95 10/30/2016   GFRAA >60 10/30/2016   GFRNONAA  >60 10/30/2016                             Hepatic Function Markers No results found for: AST, ALT, ALBUMIN, ALKPHOS, HCVAB, AMYLASE, LIPASE, AMMONIA                      Electrolytes Lab Results  Component Value Date   NA 136 10/30/2016   K 5.0 10/30/2016   CL 105 10/30/2016   CALCIUM 9.2 10/30/2016                        Neuropathy Markers No results found for: VITAMINB12, FOLATE, HGBA1C, HIV                      CNS Tests No results found for: COLORCSF, APPEARCSF, RBCCOUNTCSF, WBCCSF, POLYSCSF, LYMPHSCSF, EOSCSF,  PROTEINCSF, GLUCCSF, [place], CSFOLI, IGGCSF                      Bone Pathology Markers No results found for: VD25OH, [id number], [id number], [id number], 25OHVITD1, 25OHVITD2, 25OHVITD3, TESTOFREE, TESTOSTERONE                       Coagulation Parameters Lab Results  Component Value Date   APTT 29.1 05/02/2013   PLT 240 10/30/2016                        Cardiovascular Markers Lab Results  Component Value Date   HGB 14.2 10/30/2016   HCT 41.9 10/30/2016                         CA Markers No results found for: CEA, CA125, LABCA2                      Note: Lab results reviewed.  Recent Diagnostic Imaging Results  DG C-Arm 1-60 Min-No Report Fluoroscopy was utilized by the requesting physician.  No radiographic  interpretation.   Complexity Note: Imaging results reviewed. Results shared with Mr. Mazariego, using Layman's terms.                         Meds   Current Outpatient Medications:  .  amLODipine (NORVASC) 5 MG tablet, Take 5 mg by mouth at bedtime as needed., Disp: , Rfl:  .  aspirin EC 81 MG tablet, Take 81 mg by mouth daily., Disp: , Rfl:  .  diphenhydramine-acetaminophen (TYLENOL PM) 25-500 MG TABS tablet, Take 2 tablets by mouth at bedtime as needed., Disp: , Rfl:  .  dorzolamide (TRUSOPT) 2 % ophthalmic solution, Place 1 drop into both eyes 2 (two) times daily., Disp: , Rfl:  .  glimepiride (AMARYL) 2 MG tablet, Take 4 mg by mouth daily. ,  Disp: , Rfl:  .  latanoprost (XALATAN) 0.005 % ophthalmic solution, Place 1 drop into both eyes at bedtime., Disp: , Rfl:  .  lisinopril (PRINIVIL,ZESTRIL) 10 MG tablet, Take 10 mg by mouth daily., Disp: , Rfl:  .  lisinopril (PRINIVIL,ZESTRIL) 10 MG tablet, TAKE 1 TABLET BY MOUTH ONCE DAILY, Disp: , Rfl:  .  metFORMIN (GLUCOPHAGE) 500 MG tablet, Take 1,000 mg by mouth 2 (two) times daily with a meal. , Disp: , Rfl:  .  Multiple Vitamin (MULTI-VITAMINS) TABS, Take 1 tablet by mouth daily., Disp: , Rfl:  .  omeprazole (PRILOSEC OTC) 20 MG tablet, Take 20 mg by mouth daily., Disp: , Rfl:  .  Probiotic Product (ALIGN PO), Take by mouth daily., Disp: , Rfl:  .  amLODipine (NORVASC) 5 MG tablet, TAKE 1 TABLET BY MOUTH NIGHTLY, Disp: , Rfl:   ROS  Constitutional: Denies any fever or chills Gastrointestinal: No reported hemesis, hematochezia, vomiting, or acute GI distress Musculoskeletal: Denies any acute onset joint swelling, redness, loss of ROM, or weakness Neurological: No reported episodes of acute onset apraxia, aphasia, dysarthria, agnosia, amnesia, paralysis, loss of coordination, or loss of consciousness  Allergies  Mr. Mendell is allergic to morphine and related.  Sophia  Drug: Mr. Wint  reports that he does not use drugs. Alcohol:  reports that he does not drink alcohol. Tobacco:  reports that he has never smoked. He has never used smokeless tobacco. Medical:  has a past medical  history of Arthritis, Diabetes mellitus without complication ([place]), GERD (gastroesophageal reflux disease), Hard of hearing, HLD (hyperlipidemia), HTN (hypertension), Hypotension, postural, IBS (irritable bowel syndrome), Sleep apnea, and Wears dentures. Surgical: Mr. Shank  has a past surgical history that includes Appendectomy; Tonsillectomy; Joint replacement; Rotator cuff repair; LASIK; Back surgery; Cataract extraction w/PHACO (Right, 08/29/2017); Insertion of anterior segment aqueous drainage device (ISTENT)  (Right, 08/29/2017); Cataract extraction w/PHACO (Left, 11/21/2017); Insertion of anterior segment aqueous drainage device (ISTENT) (Left, 11/21/2017); and Eye surgery (Left, 11/21/2017). Family: family history includes Diabetes in his father.  Constitutional Exam  General appearance: Well nourished, well developed, and well hydrated. In no apparent acute distress Vitals:   01/22/18 0850  Weight: 221 lb (100.2 kg)  Height: 6' 4"  (1.93 m)   BMI Assessment: Estimated body mass index is 26.9 kg/m as calculated from the following:   Height as of this encounter: 6' 4"  (1.93 m).   Weight as of this encounter: 221 lb (100.2 kg).  BMI interpretation table: BMI level Category Range association with higher incidence of chronic pain  <18 kg/m2 Underweight   18.5-24.9 kg/m2 Ideal body weight   25-29.9 kg/m2 Overweight Increased incidence by 20%  30-34.9 kg/m2 Obese (Class I) Increased incidence by 68%  35-39.9 kg/m2 Severe obesity (Class II) Increased incidence by 136%  >40 kg/m2 Extreme obesity (Class III) Increased incidence by 254%   Patient's current BMI Ideal Body weight  Body mass index is 26.9 kg/m. Ideal body weight: 86.8 kg (191 lb 5.7 oz) Adjusted ideal body weight: 92.2 kg (203 lb 3.4 oz)   BMI Readings from Last 4 Encounters:  01/22/18 26.90 kg/m  12/10/17 26.27 kg/m  11/21/17 27.12 kg/m  09/04/17 26.41 kg/m   Wt Readings from Last 4 Encounters:  01/22/18 221 lb (100.2 kg)  12/10/17 213 lb (96.6 kg)  11/21/17 217 lb (98.4 kg)  09/04/17 217 lb (98.4 kg)  Psych/Mental status: Alert, oriented x 3 (person, place, & time)       Eyes: PERLA Respiratory: No evidence of acute respiratory distress  Cervical Spine Area Exam  Skin & Axial Inspection: No masses, redness, edema, swelling, or associated skin lesions Alignment: Symmetrical Functional ROM: Unrestricted ROM      Stability: No instability detected Muscle Tone/Strength: Functionally intact. No obvious neuro-muscular  anomalies detected. Sensory (Neurological): Unimpaired Palpation: No palpable anomalies              Upper Extremity (UE) Exam    Side: Right upper extremity  Side: Left upper extremity  Skin & Extremity Inspection: Skin color, temperature, and hair growth are WNL. No peripheral edema or cyanosis. No masses, redness, swelling, asymmetry, or associated skin lesions. No contractures.  Skin & Extremity Inspection: Skin color, temperature, and hair growth are WNL. No peripheral edema or cyanosis. No masses, redness, swelling, asymmetry, or associated skin lesions. No contractures.  Functional ROM: Unrestricted ROM          Functional ROM: Unrestricted ROM          Muscle Tone/Strength: Functionally intact. No obvious neuro-muscular anomalies detected.  Muscle Tone/Strength: Functionally intact. No obvious neuro-muscular anomalies detected.  Sensory (Neurological): Unimpaired          Sensory (Neurological): Unimpaired          Palpation: No palpable anomalies              Palpation: No palpable anomalies              Provocative Test(s):  Phalen's test: deferred  Tinel's test: deferred Apley's scratch test (touch opposite shoulder):  Action 1 (Across chest): deferred Action 2 (Overhead): deferred Action 3 (LB reach): deferred   Provocative Test(s):  Phalen's test: deferred Tinel's test: deferred Apley's scratch test (touch opposite shoulder):  Action 1 (Across chest): deferred Action 2 (Overhead): deferred Action 3 (LB reach): deferred    Thoracic Spine Area Exam  Skin & Axial Inspection: No masses, redness, or swelling Alignment: Symmetrical Functional ROM: Unrestricted ROM Stability: No instability detected Muscle Tone/Strength: Functionally intact. No obvious neuro-muscular anomalies detected. Sensory (Neurological): Unimpaired Muscle strength & Tone: No palpable anomalies  Lumbar Spine Area Exam  Skin & Axial Inspection: No masses, redness, or swelling Alignment:  Symmetrical Functional ROM: Unrestricted ROM       Stability: No instability detected Muscle Tone/Strength: Functionally intact. No obvious neuro-muscular anomalies detected. Sensory (Neurological): Unimpaired Palpation: No palpable anomalies       Provocative Tests: Hyperextension/rotation test: deferred today       Lumbar quadrant test (Kemp's test): deferred today       Lateral bending test: deferred today       Patrick's Maneuver: deferred today                   FABER test: deferred today                   S-I anterior distraction/compression test: deferred today         S-I lateral compression test: deferred today         S-I Thigh-thrust test: deferred today         S-I Gaenslen's test: deferred today          Gait & Posture Assessment  Ambulation: Unassisted Gait: Relatively normal for age and body habitus Posture: WNL   Lower Extremity Exam    Side: Right lower extremity  Side: Left lower extremity  Stability: No instability observed          Stability: No instability observed          Skin & Extremity Inspection: Skin color, temperature, and hair growth are WNL. No peripheral edema or cyanosis. No masses, redness, swelling, asymmetry, or associated skin lesions. No contractures.  Skin & Extremity Inspection: Skin color, temperature, and hair growth are WNL. No peripheral edema or cyanosis. No masses, redness, swelling, asymmetry, or associated skin lesions. No contractures.  Functional ROM: Improved after treatment                  Functional ROM: Improved after treatment                  Muscle Tone/Strength: Functionally intact. No obvious neuro-muscular anomalies detected.  Muscle Tone/Strength: Functionally intact. No obvious neuro-muscular anomalies detected.  Sensory (Neurological): Arthropathic arthralgia        Sensory (Neurological): Arthropathic arthralgia        DTR: Patellar: deferred today Achilles: deferred today Plantar: deferred today  DTR: Patellar:  deferred today Achilles: deferred today Plantar: deferred today  Palpation: No palpable anomalies  Palpation: No palpable anomalies   Assessment  Primary Diagnosis & Pertinent Problem List: The primary encounter diagnosis was Primary osteoarthritis of right knee. Diagnoses of Primary osteoarthritis of left knee and Status post left partial knee replacement were also pertinent to this visit.  Status Diagnosis  Improving Improving Controlled 1. Primary osteoarthritis of right knee   2. Primary osteoarthritis of left knee   3. Status post left partial knee replacement  Patient is a very pleasant 67 year old male who follows up status post bilateral genicular nerve block #2 bilateral knee osteoarthritis.  Patient endorses significant pain relief and improvement in his functional status since his previous genicular nerve block and endorses ongoing pain relief.  He rates his pain relief currently is 85%.  Patient was able to go on a cruise and states that he did not have much knee pain.  We discussed RFA versus repeat genicular nerve block when he has return of his knee pain.  Patient prefers to have repeat genicular nerve block.  This is reasonable.  If duration pain relief from genicular nerve block diminishes can consider RFA.  Plan: -PRN bilateral genicular nerve block #3  Plan of Care  Pharmacotherapy (Medications Ordered): No orders of the defined types were placed in this encounter.  Lab-work, procedure(s), and/or referral(s): Orders Placed This Encounter  Procedures  . Radiofrequency,Genicular   Time Note: Greater than 50% of the 25 minute(s) of face-to-face time spent with Mr. Bensinger, was spent in counseling/coordination of care regarding: Mr. Friedt primary cause of pain, the treatment plan, treatment alternatives, the risks and possible complications of proposed treatment, going over the informed consent and the results, interpretation and significance of  his recent  diagnostic interventional treatment(s).  Provider-requested follow-up: Return if symptoms worsen or fail to improve.  No future appointments.  Primary Care Physician: Rusty Aus, MD Location: [facility] Note by: Gillis Santa, M.D Date: 01/22/2018; Time: 9:13 AM  There are no Patient Instructions on file for this visit.

## 2018-01-22 NOTE — Progress Notes (Signed)
Safety precautions to be maintained throughout the outpatient stay will include: orient to surroundings, keep bed in low position, maintain call bell within reach at all times, provide assistance with transfer out of bed and ambulation.  

## 2018-03-26 DIAGNOSIS — Z800 Family history of malignant neoplasm of digestive organs: Secondary | ICD-10-CM | POA: Insufficient documentation

## 2019-03-18 ENCOUNTER — Other Ambulatory Visit: Payer: Self-pay

## 2019-03-18 ENCOUNTER — Emergency Department: Payer: Medicare PPO

## 2019-03-18 ENCOUNTER — Emergency Department
Admission: EM | Admit: 2019-03-18 | Discharge: 2019-03-18 | Disposition: A | Discharge status: Home / Self Care | Payer: Medicare PPO | Attending: Emergency Medicine | Admitting: Emergency Medicine

## 2019-03-18 ENCOUNTER — Encounter: Payer: Self-pay | Admitting: Emergency Medicine

## 2019-03-18 DIAGNOSIS — Z96652 Presence of left artificial knee joint: Secondary | ICD-10-CM | POA: Diagnosis not present

## 2019-03-18 DIAGNOSIS — S3992XA Unspecified injury of lower back, initial encounter: Secondary | ICD-10-CM | POA: Diagnosis present

## 2019-03-18 DIAGNOSIS — Y92410 Unspecified street and highway as the place of occurrence of the external cause: Secondary | ICD-10-CM | POA: Insufficient documentation

## 2019-03-18 DIAGNOSIS — E119 Type 2 diabetes mellitus without complications: Secondary | ICD-10-CM | POA: Insufficient documentation

## 2019-03-18 DIAGNOSIS — M25562 Pain in left knee: Secondary | ICD-10-CM | POA: Insufficient documentation

## 2019-03-18 DIAGNOSIS — Y998 Other external cause status: Secondary | ICD-10-CM | POA: Insufficient documentation

## 2019-03-18 DIAGNOSIS — Y9389 Activity, other specified: Secondary | ICD-10-CM | POA: Diagnosis not present

## 2019-03-18 DIAGNOSIS — I10 Essential (primary) hypertension: Secondary | ICD-10-CM | POA: Diagnosis not present

## 2019-03-18 DIAGNOSIS — S32010A Wedge compression fracture of first lumbar vertebra, initial encounter for closed fracture: Secondary | ICD-10-CM | POA: Insufficient documentation

## 2019-03-18 DIAGNOSIS — Z7984 Long term (current) use of oral hypoglycemic drugs: Secondary | ICD-10-CM | POA: Insufficient documentation

## 2019-03-18 DIAGNOSIS — Z79899 Other long term (current) drug therapy: Secondary | ICD-10-CM | POA: Diagnosis not present

## 2019-03-18 DIAGNOSIS — Z7982 Long term (current) use of aspirin: Secondary | ICD-10-CM | POA: Diagnosis not present

## 2019-03-18 DIAGNOSIS — M7918 Myalgia, other site: Secondary | ICD-10-CM | POA: Diagnosis not present

## 2019-03-18 DIAGNOSIS — M25561 Pain in right knee: Secondary | ICD-10-CM | POA: Diagnosis not present

## 2019-03-18 MED ORDER — LIDOCAINE 5 % EX PTCH
2.0000 | MEDICATED_PATCH | CUTANEOUS | Status: DC
  Administered 2019-03-18: 2 via TRANSDERMAL
  Filled 2019-03-18 (×2): qty 2

## 2019-03-18 MED ORDER — ORPHENADRINE CITRATE 30 MG/ML IJ SOLN
60.0000 mg | Freq: Two times a day (BID) | INTRAMUSCULAR | Status: DC
  Administered 2019-03-18: 60 mg via INTRAMUSCULAR
  Filled 2019-03-18: qty 2

## 2019-03-18 MED ORDER — LIDOCAINE 5 % EX PTCH: 1.0000 | MEDICATED_PATCH | Freq: Two times a day (BID) | CUTANEOUS | 1 refills | Status: DC

## 2019-03-18 MED ORDER — KETOROLAC TROMETHAMINE 60 MG/2ML IM SOLN
30.0000 mg | Freq: Once | INTRAMUSCULAR | Status: AC
  Administered 2019-03-18: 30 mg via INTRAMUSCULAR
  Filled 2019-03-18: qty 2

## 2019-03-18 MED ORDER — TRAMADOL HCL 50 MG PO TABS: 50.0000 mg | ORAL_TABLET | Freq: Two times a day (BID) | ORAL | 0 refills | Status: DC | PRN

## 2019-03-18 MED ORDER — CYCLOBENZAPRINE HCL 5 MG PO TABS: 5.0000 mg | ORAL_TABLET | Freq: Three times a day (TID) | ORAL | 0 refills | Status: DC | PRN

## 2019-03-18 NOTE — Discharge Instructions (Signed)
Follow discharge care instruction take medication as directed.  Be advised of drowsy effects of muscle relaxers and pain medications.
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
## 2019-03-25 ENCOUNTER — Other Ambulatory Visit: Payer: Self-pay | Admitting: Internal Medicine

## 2019-03-25 ENCOUNTER — Ambulatory Visit
Admission: RE | Admit: 2019-03-25 | Discharge: 2019-03-25 | Disposition: A | Discharge status: Home / Self Care | Payer: Medicare PPO | Source: Ambulatory Visit | Attending: Internal Medicine | Admitting: Internal Medicine

## 2019-03-25 ENCOUNTER — Other Ambulatory Visit: Payer: Self-pay

## 2019-03-25 DIAGNOSIS — S32010A Wedge compression fracture of first lumbar vertebra, initial encounter for closed fracture: Secondary | ICD-10-CM

## 2019-03-26 ENCOUNTER — Other Ambulatory Visit
Admission: RE | Admit: 2019-03-26 | Discharge: 2019-03-26 | Disposition: A | Discharge status: Home / Self Care | Payer: Medicare PPO | Source: Ambulatory Visit | Attending: Orthopedic Surgery | Admitting: Orthopedic Surgery

## 2019-03-26 ENCOUNTER — Encounter
Admission: RE | Admit: 2019-03-26 | Discharge: 2019-03-26 | Disposition: A | Discharge status: Home / Self Care | Payer: Medicare PPO | Source: Ambulatory Visit | Attending: Orthopedic Surgery | Admitting: Orthopedic Surgery

## 2019-03-26 ENCOUNTER — Other Ambulatory Visit: Payer: Self-pay | Admitting: Orthopedic Surgery

## 2019-03-26 DIAGNOSIS — Z20822 Contact with and (suspected) exposure to covid-19: Secondary | ICD-10-CM | POA: Diagnosis not present

## 2019-03-26 DIAGNOSIS — Z01812 Encounter for preprocedural laboratory examination: Secondary | ICD-10-CM | POA: Insufficient documentation

## 2019-03-26 LAB — SARS CORONAVIRUS 2 (TAT 6-24 HRS): SARS Coronavirus 2: NEGATIVE

## 2019-03-26 NOTE — Patient Instructions (Addendum)
Your procedure is scheduled on: Friday 03/28/19.  Report to DAY SURGERY DEPARTMENT LOCATED ON 2ND FLOOR MEDICAL MALL ENTRANCE. To find out your arrival time please call [phone] between 1PM - 3PM on 03/27/19.   Remember: Instructions that are not followed completely may result in serious medical risk, up to and including death, or upon the discretion of your surgeon and anesthesiologist your surgery may need to be rescheduled.      _X__ 1. Do not eat food after midnight the night before your procedure.                 No gum chewing or hard candies. You may drink SUGAR FREE clear liquids up to 2 hours                 before you are scheduled to arrive for your surgery- DO NOT drink clear                 liquids within 2 hours of the start of your surgery.                   ** Dr. Rudene Christians would like for you to finish the G2 Gatorade 2 hours prior to your arrival time on the morning of your surgery. **   __X__2.  On the morning of surgery brush your teeth with toothpaste and water, you may rinse your mouth with mouthwash if you wish.  Do not swallow any toothpaste or mouthwash.       _X__ 3.  No Alcohol for 24 hours before or after surgery.     _X__ 4.  Do Not Smoke or use e-cigarettes For 24 Hours Prior to Your Surgery.                 Do not use any chewable tobacco products for at least 6 hours prior to                 Surgery.    __X__6.  Notify your doctor if there is any change in your medical condition      (cold, fever, infections).       Do not wear jewelry, make-up, hairpins, clips or nail polish. Do not wear lotions, powders, or perfumes.  Do not shave 48 hours prior to surgery. Men may shave face and neck. Do not bring valuables to the hospital.     [facility] is not responsible for any belongings or valuables.    Contacts, dentures/partials or body piercings may not be worn into surgery. Bring a case for your contacts, glasses or hearing aids, a denture cup  will be supplied.    Patients discharged the day of surgery will not be allowed to drive home.     __X__ Take these medicines the morning of surgery with A SIP OF WATER:     1. traMADol (ULTRAM) 50 MG tablet if needed  2. cyclobenzaprine (FLEXERIL) 5 MG tablet if needed      __X__ Use CHG Soap as directed   __X__ Stop Metformin 2 days prior to surgery.     __X__ Stop Blood Thinners: Aspirin TODAY 03/26/19.   __X__ Stop Anti-inflammatories 7 days before surgery such as Advil, Ibuprofen, Motrin, BC or Goodies Powder, Naprosyn, Naproxen, Aleve, Aspirin, Meloxicam. May take Tylenol if needed for pain or discomfort.    __X__ Don't start taking any new herbal supplements before your procedure.

## 2019-03-27 ENCOUNTER — Other Ambulatory Visit: Payer: Self-pay

## 2019-03-27 ENCOUNTER — Encounter
Admission: RE | Admit: 2019-03-27 | Discharge: 2019-03-27 | Disposition: A | Discharge status: Home / Self Care | Payer: Medicare PPO | Source: Ambulatory Visit | Attending: Orthopedic Surgery | Admitting: Orthopedic Surgery

## 2019-03-27 DIAGNOSIS — R9431 Abnormal electrocardiogram [ECG] [EKG]: Secondary | ICD-10-CM | POA: Insufficient documentation

## 2019-03-27 DIAGNOSIS — Z01810 Encounter for preprocedural cardiovascular examination: Secondary | ICD-10-CM | POA: Diagnosis present

## 2019-03-27 DIAGNOSIS — Z01812 Encounter for preprocedural laboratory examination: Secondary | ICD-10-CM | POA: Diagnosis present

## 2019-03-27 LAB — BASIC METABOLIC PANEL
Anion gap: 6 (ref 5–15)
BUN: 11 mg/dL (ref 8–23)
CO2: 31 mmol/L (ref 22–32)
Calcium: 9.6 mg/dL (ref 8.9–10.3)
Chloride: 103 mmol/L (ref 98–111)
Creatinine, Ser: 0.86 mg/dL (ref 0.61–1.24)
GFR calc Af Amer: >60 mL/min (ref >60)
GFR calc non Af Amer: >60 mL/min (ref >60)
Glucose, Bld: 240 mg/dL — ABNORMAL HIGH (ref 70–99)
Potassium: 4.3 mmol/L (ref 3.5–5.1)
Sodium: 140 mmol/L (ref 135–145)

## 2019-03-27 LAB — CBC
HCT: 38.6 % — ABNORMAL LOW (ref 39.0–52.0)
Hemoglobin: 12.5 g/dL — ABNORMAL LOW (ref 13.0–17.0)
MCH: 29 pg (ref 26.0–34.0)
MCHC: 32.4 g/dL (ref 30.0–36.0)
MCV: 89.6 fL (ref 80.0–100.0)
Platelets: 338 10*3/uL (ref 150–400)
RBC: 4.31 MIL/uL (ref 4.22–5.81)
RDW: 12.1 % (ref 11.5–15.5)
WBC: 7.7 10*3/uL (ref 4.0–10.5)
nRBC: 0 % (ref 0.0–0.2)

## 2019-03-28 ENCOUNTER — Ambulatory Visit: Payer: Medicare PPO | Admitting: Anesthesiology

## 2019-03-28 ENCOUNTER — Ambulatory Visit: Payer: Medicare PPO

## 2019-03-28 ENCOUNTER — Other Ambulatory Visit: Payer: Self-pay

## 2019-03-28 ENCOUNTER — Ambulatory Visit
Admission: RE | Admit: 2019-03-28 | Discharge: 2019-03-28 | Disposition: A | Discharge status: Home / Self Care | Payer: Medicare PPO | Attending: Orthopedic Surgery | Admitting: Orthopedic Surgery

## 2019-03-28 ENCOUNTER — Encounter
Admission: RE | Disposition: A | Discharge status: Home / Self Care | Payer: Self-pay | Source: Home / Self Care | Attending: Orthopedic Surgery

## 2019-03-28 ENCOUNTER — Encounter: Payer: Self-pay | Admitting: Orthopedic Surgery

## 2019-03-28 DIAGNOSIS — Z419 Encounter for procedure for purposes other than remedying health state, unspecified: Secondary | ICD-10-CM

## 2019-03-28 DIAGNOSIS — E119 Type 2 diabetes mellitus without complications: Secondary | ICD-10-CM | POA: Diagnosis not present

## 2019-03-28 DIAGNOSIS — S32010A Wedge compression fracture of first lumbar vertebra, initial encounter for closed fracture: Secondary | ICD-10-CM | POA: Insufficient documentation

## 2019-03-28 DIAGNOSIS — Z885 Allergy status to narcotic agent status: Secondary | ICD-10-CM | POA: Diagnosis not present

## 2019-03-28 DIAGNOSIS — Z981 Arthrodesis status: Secondary | ICD-10-CM | POA: Insufficient documentation

## 2019-03-28 DIAGNOSIS — Z7984 Long term (current) use of oral hypoglycemic drugs: Secondary | ICD-10-CM | POA: Diagnosis not present

## 2019-03-28 DIAGNOSIS — E785 Hyperlipidemia, unspecified: Secondary | ICD-10-CM | POA: Diagnosis not present

## 2019-03-28 DIAGNOSIS — Z7982 Long term (current) use of aspirin: Secondary | ICD-10-CM | POA: Diagnosis not present

## 2019-03-28 DIAGNOSIS — I10 Essential (primary) hypertension: Secondary | ICD-10-CM | POA: Diagnosis not present

## 2019-03-28 DIAGNOSIS — Z79899 Other long term (current) drug therapy: Secondary | ICD-10-CM | POA: Diagnosis not present

## 2019-03-28 DIAGNOSIS — G4730 Sleep apnea, unspecified: Secondary | ICD-10-CM | POA: Insufficient documentation

## 2019-03-28 HISTORY — PX: KYPHOPLASTY: SHX5884

## 2019-03-28 LAB — GLUCOSE, CAPILLARY
Glucose-Capillary: 125 mg/dL — ABNORMAL HIGH (ref 70–99)
Glucose-Capillary: 138 mg/dL — ABNORMAL HIGH (ref 70–99)

## 2019-03-28 SURGERY — KYPHOPLASTY
Anesthesia: General

## 2019-03-28 MED ORDER — METOCLOPRAMIDE HCL 5 MG/ML IJ SOLN: 5.0000 mg | Freq: Three times a day (TID) | INTRAMUSCULAR | Status: DC | PRN

## 2019-03-28 MED ORDER — SODIUM CHLORIDE 0.9 % IV SOLN: INTRAVENOUS | Status: DC

## 2019-03-28 MED ORDER — FAMOTIDINE 20 MG PO TABS: 20.0000 mg | ORAL_TABLET | Freq: Once | ORAL | Status: AC

## 2019-03-28 MED ORDER — TRAMADOL HCL 50 MG PO TABS: 50.0000 mg | ORAL_TABLET | Freq: Four times a day (QID) | ORAL | 0 refills | Status: DC | PRN

## 2019-03-28 MED ORDER — LIDOCAINE HCL (CARDIAC) PF 100 MG/5ML IV SOSY
PREFILLED_SYRINGE | INTRAVENOUS | Status: DC | PRN
  Administered 2019-03-28: 60 mg via INTRAVENOUS
  Administered 2019-03-28: 40 mg via INTRAVENOUS

## 2019-03-28 MED ORDER — HYDROMORPHONE HCL 1 MG/ML IJ SOLN: 0.5000 mg | INTRAMUSCULAR | Status: DC | PRN

## 2019-03-28 MED ORDER — EPINEPHRINE PF 1 MG/ML IJ SOLN
INTRAMUSCULAR | Status: AC
  Filled 2019-03-28: qty 1

## 2019-03-28 MED ORDER — ACETAMINOPHEN 500 MG PO TABS
ORAL_TABLET | ORAL | Status: AC
  Filled 2019-03-28: qty 2

## 2019-03-28 MED ORDER — HYDROMORPHONE HCL 1 MG/ML IJ SOLN
INTRAMUSCULAR | Status: AC
  Administered 2019-03-28: 17:00:00 0.5 mg via INTRAVENOUS
  Filled 2019-03-28: qty 1

## 2019-03-28 MED ORDER — LIDOCAINE HCL (PF) 1 % IJ SOLN
INTRAMUSCULAR | Status: AC
  Filled 2019-03-28: qty 60

## 2019-03-28 MED ORDER — BUPIVACAINE HCL (PF) 0.5 % IJ SOLN
INTRAMUSCULAR | Status: AC
  Filled 2019-03-28: qty 30

## 2019-03-28 MED ORDER — FENTANYL CITRATE (PF) 100 MCG/2ML IJ SOLN
INTRAMUSCULAR | Status: AC
  Administered 2019-03-28: 25 ug via INTRAVENOUS
  Filled 2019-03-28: qty 2

## 2019-03-28 MED ORDER — PROPOFOL 10 MG/ML IV BOLUS
INTRAVENOUS | Status: AC
  Filled 2019-03-28: qty 20

## 2019-03-28 MED ORDER — FENTANYL CITRATE (PF) 100 MCG/2ML IJ SOLN
25.0000 ug | INTRAMUSCULAR | Status: AC | PRN
  Administered 2019-03-28 (×4): 25 ug via INTRAVENOUS

## 2019-03-28 MED ORDER — IOHEXOL 180 MG/ML  SOLN
INTRAMUSCULAR | Status: DC | PRN
  Administered 2019-03-28: 20 mL

## 2019-03-28 MED ORDER — CEFAZOLIN SODIUM-DEXTROSE 2-4 GM/100ML-% IV SOLN
INTRAVENOUS | Status: AC
  Filled 2019-03-28: qty 100

## 2019-03-28 MED ORDER — PROPOFOL 500 MG/50ML IV EMUL
INTRAVENOUS | Status: DC | PRN
  Administered 2019-03-28: 70 ug/kg/min via INTRAVENOUS

## 2019-03-28 MED ORDER — METOCLOPRAMIDE HCL 10 MG PO TABS: 5.0000 mg | ORAL_TABLET | Freq: Three times a day (TID) | ORAL | Status: DC | PRN

## 2019-03-28 MED ORDER — CEFAZOLIN SODIUM-DEXTROSE 2-4 GM/100ML-% IV SOLN
2.0000 g | INTRAVENOUS | Status: AC
  Administered 2019-03-28: 2 g via INTRAVENOUS

## 2019-03-28 MED ORDER — FAMOTIDINE 20 MG PO TABS
ORAL_TABLET | ORAL | Status: AC
  Administered 2019-03-28: 14:00:00 20 mg via ORAL
  Filled 2019-03-28: qty 1

## 2019-03-28 MED ORDER — ONDANSETRON HCL 4 MG/2ML IJ SOLN
4.0000 mg | Freq: Four times a day (QID) | INTRAMUSCULAR | Status: DC | PRN
  Administered 2019-03-28: 18:00:00 4 mg via INTRAVENOUS

## 2019-03-28 MED ORDER — KETAMINE HCL 10 MG/ML IJ SOLN
INTRAMUSCULAR | Status: DC | PRN
  Administered 2019-03-28: 20 mg via INTRAVENOUS
  Administered 2019-03-28: 30 mg via INTRAVENOUS

## 2019-03-28 MED ORDER — DEXMEDETOMIDINE HCL 200 MCG/2ML IV SOLN
INTRAVENOUS | Status: DC | PRN
  Administered 2019-03-28: 20 ug via INTRAVENOUS

## 2019-03-28 MED ORDER — ONDANSETRON HCL 4 MG PO TABS: 4.0000 mg | ORAL_TABLET | Freq: Four times a day (QID) | ORAL | Status: DC | PRN

## 2019-03-28 MED ORDER — ACETAMINOPHEN 500 MG PO TABS
1000.0000 mg | ORAL_TABLET | Freq: Once | ORAL | Status: AC
  Administered 2019-03-28: 17:00:00 1000 mg via ORAL

## 2019-03-28 MED ORDER — PROPOFOL 10 MG/ML IV BOLUS
INTRAVENOUS | Status: DC | PRN
  Administered 2019-03-28: 30 ug via INTRAVENOUS
  Administered 2019-03-28 (×2): 20 ug via INTRAVENOUS
  Administered 2019-03-28: 10 ug via INTRAVENOUS
  Administered 2019-03-28: 50 ug via INTRAVENOUS

## 2019-03-28 MED ORDER — CHLORHEXIDINE GLUCONATE 4 % EX LIQD: 60.0000 mL | Freq: Once | CUTANEOUS | Status: DC

## 2019-03-28 MED ORDER — LIDOCAINE HCL 1 % IJ SOLN
INTRAMUSCULAR | Status: DC | PRN
  Administered 2019-03-28 (×2): 10 mL

## 2019-03-28 MED ORDER — LIDOCAINE HCL (PF) 2 % IJ SOLN
INTRAMUSCULAR | Status: AC
  Filled 2019-03-28: qty 10

## 2019-03-28 MED ORDER — BUPIVACAINE-EPINEPHRINE (PF) 0.5% -1:200000 IJ SOLN
INTRAMUSCULAR | Status: DC | PRN
  Administered 2019-03-28: 10 mL

## 2019-03-28 MED ORDER — ONDANSETRON HCL 4 MG/2ML IJ SOLN
INTRAMUSCULAR | Status: AC
  Filled 2019-03-28: qty 2

## 2019-03-28 SURGICAL SUPPLY — 18 items
CEMENT KYPHON CX01A KIT/MIXER (Cement) ×2 IMPLANT
COVER WAND RF STERILE (DRAPES) ×2 IMPLANT
DERMABOND ADVANCED (GAUZE/BANDAGES/DRESSINGS) ×1
DERMABOND ADVANCED .7 DNX12 (GAUZE/BANDAGES/DRESSINGS) ×1 IMPLANT
DEVICE BIOPSY BONE KYPHX (INSTRUMENTS) ×2 IMPLANT
DRAPE C-ARM XRAY 36X54 (DRAPES) ×2 IMPLANT
DURAPREP 26ML APPLICATOR (WOUND CARE) ×2 IMPLANT
GLOVE SURG SYN 9.0  PF PI (GLOVE) ×1
GLOVE SURG SYN 9.0 PF PI (GLOVE) ×1 IMPLANT
GOWN SRG 2XL LVL 4 RGLN SLV (GOWNS) ×1 IMPLANT
GOWN STRL NON-REIN 2XL LVL4 (GOWNS) ×1
GOWN STRL REUS W/ TWL LRG LVL3 (GOWN DISPOSABLE) ×1 IMPLANT
GOWN STRL REUS W/TWL LRG LVL3 (GOWN DISPOSABLE) ×1
PACK KYPHOPLASTY (MISCELLANEOUS) ×2 IMPLANT
RENTAL RFA GENERATOR (MISCELLANEOUS) IMPLANT
STRAP SAFETY 5IN WIDE (MISCELLANEOUS) ×2 IMPLANT
TRAY KYPHOPAK 15/3 EXPRESS 1ST (MISCELLANEOUS) IMPLANT
TRAY KYPHOPAK 20/3 EXPRESS 1ST (MISCELLANEOUS) ×2 IMPLANT

## 2019-03-28 NOTE — H&P (Signed)
Reviewed paper H+P, will be scanned into chart. No changes noted.  

## 2019-03-28 NOTE — Op Note (Signed)
Date March 28, 2019  time 3:58 PM   PATIENT: Vernon Payne   PRE-OPERATIVE DIAGNOSIS:  closed wedge compression fracture of L1   POST-OPERATIVE DIAGNOSIS:  closed wedge compression fracture of L1   PROCEDURE:  Procedure(s): KYPHOPLASTY L1  SURGEON: Laurene Footman, MD   ASSISTANTS: None   ANESTHESIA:   local and MAC   EBL:  No intake/output data recorded.   BLOOD ADMINISTERED:none   DRAINS: none    LOCAL MEDICATIONS USED:  MARCAINE    and XYLOCAINE    SPECIMEN:   L1 vertebral body biopsy   DISPOSITION OF SPECIMEN:  Pathology   COUNTS:  YES   TOURNIQUET:  * No tourniquets in log *   IMPLANTS: Bone cement   DICTATION: .Dragon Dictation  patient was brought to the operating room and after adequate anesthesia was obtained the patient was placed prone.  C arm was brought in in good visualization of the affected level obtained on both AP and lateral projections.  After patient identification and timeout procedures were completed, local anesthetic was infiltrated with 10 cc 1% Xylocaine infiltrated subcutaneously.  This is done the area on the right side of the planned approach.  The back was then prepped and draped you sterile manner and repeat timeout procedure carried out.  A spinal needle was brought down to the pedicle on the right side of  L1 and a 50-50 mix of 1% Xylocaine half percent Sensorcaine with epinephrine total of 20 cc injected.  After allowing this to set a small incision was made and the trocar was advanced into the vertebral body in an extrapedicular fashion.  Biopsy was obtained Drilling was carried out balloon inserted with inflation to  5-1/2 cc.  When the cement was appropriate consistency 7 cc were injected into the vertebral body without extravasation, good fill superior to inferior endplates and from right to left sides along the inferior endplate.  After the cement had set the trochar was removed and permanent C-arm views obtained.  The wound was closed  with Dermabond followed by Band-Aid   PLAN OF CARE: Discharge to home after PACU   PATIENT DISPOSITION:  PACU - hemodynamically stable.

## 2019-03-28 NOTE — Discharge Instructions (Addendum)
Take it easy today and tomorrow.  Remove Band-Aid on Sunday.  Slowly increase activities try not to lift over 10 pounds for the next 2 weeks.  Avoid bending at the waist is much as possible for the next 2 weeks.  Pain medicine as directed.  AMBULATORY SURGERY  DISCHARGE INSTRUCTIONS   1) The drugs that you were given will stay in your system until tomorrow so for the next 24 hours you should not:  A) Drive an automobile B) Make any legal decisions C) Drink any alcoholic beverage   2) You may resume regular meals tomorrow.  Today it is better to start with liquids and gradually work up to solid foods.  You may eat anything you prefer, but it is better to start with liquids, then soup and crackers, and gradually work up to solid foods.   3) Please notify your doctor immediately if you have any unusual bleeding, trouble breathing, redness and pain at the surgery site, drainage, fever, or pain not relieved by medication.    4) Additional Instructions:        Please contact your physician with any problems or Same Day Surgery at [phone], Monday through Friday 6 am to 4 pm, or [place] at [facility] number at [phone].

## 2019-03-28 NOTE — Anesthesia Preprocedure Evaluation (Addendum)
Anesthesia Evaluation  Patient identified by MRN, date of birth, ID band Patient awake    Reviewed: Allergy & Precautions, H&P , NPO status , Patient's Chart, lab work & pertinent test results  Airway Mallampati: II  TM Distance: >3 FB Neck ROM: full    Dental  (+) Teeth Intact   Pulmonary sleep apnea , neg COPD,           Cardiovascular hypertension, (-) angina(-) Past MI and (-) Cardiac Stents (-) dysrhythmias      Neuro/Psych negative neurological ROS  negative psych ROS   GI/Hepatic Neg liver ROS, GERD  Controlled,  Endo/Other  diabetes  Renal/GU negative Renal ROS  negative genitourinary   Musculoskeletal   Abdominal   Peds  Hematology negative hematology ROS (+)   Anesthesia Other Findings Past Medical History: No date: Arthritis No date: Diabetes mellitus without complication (HCC) No date: GERD (gastroesophageal reflux disease) No date: Hard of hearing     Comment:  left hearing aid No date: HLD (hyperlipidemia) No date: HTN (hypertension) No date: Hypotension, postural No date: IBS (irritable bowel syndrome) No date: Sleep apnea No date: Wears dentures     Comment:  partial upper, implants on bottom  Past Surgical History: No date: APPENDECTOMY No date: BACK SURGERY     Comment:  lumbar 08/29/2017: CATARACT EXTRACTION W/PHACO; Right     Comment:  Procedure: CATARACT EXTRACTION PHACO AND INTRAOCULAR               LENS PLACEMENT ([place]) RIGHT DIABETIC;  Surgeon:               Leandrew Koyanagi, MD;  Location: [facility];              Service: Ophthalmology;  Laterality: Right;  ISTENT               INJECT 11/21/2017: CATARACT EXTRACTION W/PHACO; Left     Comment:  Procedure: CATARACT EXTRACTION PHACO AND INTRAOCULAR               LENS PLACEMENT (IOC);  Surgeon: Leandrew Koyanagi, MD;              Location: [place];  Service: Ophthalmology;                Laterality:  Left; 11/21/2017: EYE SURGERY; Left     Comment:  cataract excision 08/29/2017: INSERTION OF ANTERIOR SEGMENT AQUEOUS DRAINAGE DEVICE  (ISTENT); Right     Comment:  Procedure: (ISTENT);  Surgeon: Leandrew Koyanagi, MD;              Location: [place];  Service: Ophthalmology;                Laterality: Right;  Diabetic - oral meds 11/21/2017: INSERTION OF ANTERIOR SEGMENT AQUEOUS DRAINAGE DEVICE  (ISTENT); Left     Comment:  Procedure: INSERTION OF ANTERIOR SEGMENT AQUEOUS               DRAINAGE DEVICE ([place])  INJECT DIABETES ISTENT INJECT;               Surgeon: Leandrew Koyanagi, MD;  Location: [place];  Service: Ophthalmology;  Laterality: Left;              Diabetic - oral meds No date: JOINT REPLACEMENT     Comment:  left knee replacement No date: LASIK No date: ROTATOR  CUFF REPAIR No date: TONSILLECTOMY  BMI    Body Mass Index: 26.25 kg/m      Reproductive/Obstetrics negative OB ROS                           Anesthesia Physical Anesthesia Plan  ASA: II  Anesthesia Plan: General   Post-op Pain Management:    Induction:   PONV Risk Score and Plan: Propofol infusion and TIVA  Airway Management Planned:   Additional Equipment:   Intra-op Plan:   Post-operative Plan:   Informed Consent: I have reviewed the patients History and Physical, chart, labs and discussed the procedure including the risks, benefits and alternatives for the proposed anesthesia with the patient or authorized representative who has indicated his/her understanding and acceptance.     Dental Advisory Given  Plan Discussed with: Anesthesiologist  Anesthesia Plan Comments:         Anesthesia Quick Evaluation

## 2019-03-28 NOTE — Transfer of Care (Signed)
Immediate Anesthesia Transfer of Care Note  Patient: Vernon Payne.  Procedure(s) Performed: L1 KYPHOPLASTY (N/A )  Patient Location: PACU  Anesthesia Type:General  Level of Consciousness: drowsy  Airway & Oxygen Therapy: Patient Spontanous Breathing and Patient connected to face mask oxygen  Post-op Assessment: Report given to RN and Post -op Vital signs reviewed and stable  Post vital signs: Reviewed and stable  Last Vitals:  Vitals Value Taken Time  BP 124/74 03/28/19 1607  Temp    Pulse 76 03/28/19 1609  Resp 27 03/28/19 1609  SpO2 100 % 03/28/19 1609  Vitals shown include unvalidated device data.  Last Pain:  Vitals:   03/28/19 1304  TempSrc: Tympanic  PainSc: 8          Complications: No apparent anesthesia complications

## 2019-03-29 NOTE — Anesthesia Postprocedure Evaluation (Signed)
Anesthesia Post Note  Patient: Vernon Payne.  Procedure(s) Performed: L1 KYPHOPLASTY (N/A )  Patient location during evaluation: PACU Anesthesia Type: General Level of consciousness: awake and alert Pain management: pain level controlled Vital Signs Assessment: post-procedure vital signs reviewed and stable Respiratory status: spontaneous breathing, nonlabored ventilation, respiratory function stable and patient connected to nasal cannula oxygen Cardiovascular status: blood pressure returned to baseline and stable Postop Assessment: no apparent nausea or vomiting Anesthetic complications: no     Last Vitals:  Vitals:   03/28/19 1708 03/28/19 1718  BP: (!) 160/87 (!) 162/83  Pulse:  73  Resp:  14  Temp: (!) 36.3 C (!) 36.3 C  SpO2:  97%    Last Pain:  Vitals:   03/28/19 1718  TempSrc: Temporal  PainSc: [name] Cassady Turano

## 2019-04-01 LAB — SURGICAL PATHOLOGY

## 2019-04-28 DIAGNOSIS — S32010A Wedge compression fracture of first lumbar vertebra, initial encounter for closed fracture: Secondary | ICD-10-CM | POA: Insufficient documentation

## 2019-05-28 ENCOUNTER — Other Ambulatory Visit: Payer: Self-pay

## 2019-05-28 ENCOUNTER — Ambulatory Visit: Payer: Medicare PPO | Admitting: Dermatology

## 2019-05-28 ENCOUNTER — Encounter: Payer: Self-pay | Admitting: Dermatology

## 2019-05-28 DIAGNOSIS — D0439 Carcinoma in situ of skin of other parts of face: Secondary | ICD-10-CM

## 2019-05-28 DIAGNOSIS — L821 Other seborrheic keratosis: Secondary | ICD-10-CM

## 2019-05-28 DIAGNOSIS — L570 Actinic keratosis: Secondary | ICD-10-CM

## 2019-05-28 DIAGNOSIS — D492 Neoplasm of unspecified behavior of bone, soft tissue, and skin: Secondary | ICD-10-CM

## 2019-05-28 DIAGNOSIS — D1800 Hemangioma unspecified site: Secondary | ICD-10-CM | POA: Diagnosis not present

## 2019-05-28 DIAGNOSIS — Z1283 Encounter for screening for malignant neoplasm of skin: Secondary | ICD-10-CM

## 2019-05-28 DIAGNOSIS — D229 Melanocytic nevi, unspecified: Secondary | ICD-10-CM

## 2019-05-28 DIAGNOSIS — Z85828 Personal history of other malignant neoplasm of skin: Secondary | ICD-10-CM

## 2019-05-28 DIAGNOSIS — L578 Other skin changes due to chronic exposure to nonionizing radiation: Secondary | ICD-10-CM

## 2019-05-28 NOTE — Progress Notes (Signed)
Follow-Up Visit   Subjective  Vernon Payne. is a 69 y.o. male who presents for the following: Annual Exam (hx SCC R chest 2018), check spot (R preauricular pt thinks LN2 in past, tender to touch), and check spot (R back - pt not sure how long it has been there). Patient presents for total-body skin exam for skin cancer screening.  The following portions of the chart were reviewed this encounter and updated as appropriate:     Review of Systems: No other skin or systemic complaints.  Objective  Well appearing patient in no apparent distress; mood and affect are within normal limits.  A full examination was performed including scalp, head, eyes, ears, nose, lips, neck, chest, axillae, abdomen, back, buttocks, bilateral upper extremities, bilateral lower extremities, hands, feet, fingers, toes, fingernails, and toenails. All findings within normal limits unless otherwise noted below.  Objective  L forehead x 2 (2): Pink scaly macules   Objective  Right chest: Well healed scar with no evidence of recurrence, no lymphadenopathy.   Objective  R preauricular anterior to earlobe: 0.5cm pink scaly papule  Assessment & Plan    Actinic Damage - diffuse scaly erythematous macules with underlying dyspigmentation - Recommend daily broad spectrum sunscreen SPF 30+ to sun-exposed areas, reapply every 2 hours as needed.  - Call for new or changing lesions. Hemangiomas - Red papules - Discussed benign nature - Observe - Call for any changes Seborrheic Keratoses - Stuck-on, waxy, tan-brown papules and plaques  - Discussed benign etiology and prognosis. - Observe - Call for any changes Melanocytic Nevi - Tan-brown and/or pink-flesh-colored symmetric macules and papules - Benign appearing on exam today - Observation - Call clinic for new or changing moles - Recommend daily use of broad spectrum spf 30+ sunscreen to sun-exposed areas.  Skin cancer screening performed today.  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 Observe for recurrence. Call clinic for new or changing lesions.  Recommend regular skin exams, daily broad-spectrum spf 30+ sunscreen use, and photoprotection.     Neoplasm of skin R preauricular anterior to earlobe  Skin / nail biopsy Type of biopsy: tangential   Informed consent: discussed and consent obtained   Timeout: patient name, date of birth, surgical site, and procedure verified   Procedure prep:  Patient was prepped and draped in usual sterile fashion Prep type:  Isopropyl alcohol Anesthesia: the lesion was anesthetized in a standard fashion   Anesthetic:  1% lidocaine w/ epinephrine 1-100,000 buffered w/ 8.4% NaHCO3 Instrument used: flexible razor blade   Outcome: patient tolerated procedure well   Post-procedure details: sterile dressing applied and wound care instructions given   Dressing type: bandage and petrolatum    Specimen 1 - Surgical pathology Differential Diagnosis: R/O BCC vs SCC Check Margins: No 0.5cm pink scaly papule  Return in about 6 months (around 11/27/2019) for check sun exposed areas.   I, Othelia Pulling, RMA, am acting as scribe for Sarina Ser, MD .

## 2019-05-28 NOTE — Patient Instructions (Signed)

## 2019-06-06 ENCOUNTER — Telehealth: Payer: Self-pay

## 2019-06-06 NOTE — Telephone Encounter (Signed)
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

## 2019-06-06 NOTE — Telephone Encounter (Signed)
Advised pts wife of bx results.  Scheduled pt for 07/15/19 for [facility] of SCCIS/sh

## 2019-07-15 ENCOUNTER — Encounter: Payer: Self-pay | Admitting: Dermatology

## 2019-07-15 ENCOUNTER — Other Ambulatory Visit: Payer: Self-pay

## 2019-07-15 ENCOUNTER — Ambulatory Visit: Payer: Medicare PPO | Admitting: Dermatology

## 2019-07-15 DIAGNOSIS — L578 Other skin changes due to chronic exposure to nonionizing radiation: Secondary | ICD-10-CM | POA: Diagnosis not present

## 2019-07-15 DIAGNOSIS — D0439 Carcinoma in situ of skin of other parts of face: Secondary | ICD-10-CM | POA: Diagnosis not present

## 2019-07-15 DIAGNOSIS — L820 Inflamed seborrheic keratosis: Secondary | ICD-10-CM

## 2019-07-15 DIAGNOSIS — D099 Carcinoma in situ, unspecified: Secondary | ICD-10-CM

## 2019-07-15 NOTE — Progress Notes (Signed)
   Follow-Up Visit   Subjective  Vernon Payne. is a 69 y.o. male who presents for the following: SCC IS by proven (/R preauricular ant to lobe, bx 05/28/19) and check spot (L post auricular, pt just noticed).   The following portions of the chart were reviewed this encounter and updated as appropriate:  Tobacco  Allergies  Meds  Problems  Med Hx  Surg Hx  Fam Hx      Review of Systems:  No other skin or systemic complaints except as noted in HPI or Assessment and Plan.  Objective  Well appearing patient in no apparent distress; mood and affect are within normal limits.  A focused examination was performed including face, scalp, neck. Relevant physical exam findings are noted in the Assessment and Plan.  Objective  L post auricular/scalp x 1: Erythematous keratotic or waxy stuck-on papule or plaque.   Objective  R preauricular ant to lobe: Pink bx site   Assessment & Plan    Actinic Damage - diffuse scaly erythematous macules with underlying dyspigmentation - Recommend daily broad spectrum sunscreen SPF 30+ to sun-exposed areas, reapply every 2 hours as needed.  - Call for new or changing lesions.   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  Destruction of lesion Complexity: extensive   Destruction method: electrodesiccation and curettage   Informed consent: discussed and consent obtained   Timeout:   patient name, date of birth, surgical site, and procedure verified Procedure prep:  Patient was prepped and draped in usual sterile fashion Prep type:  Isopropyl alcohol Anesthesia: the lesion was anesthetized in a standard fashion   Anesthetic:  1% lidocaine w/ epinephrine 1-100,000 buffered w/ 8.4% NaHCO3 Curettage performed in three different directions: Yes   Electrodesiccation performed over the curetted area: Yes   Lesion length (cm):  0.6 Lesion width (cm):  0.6 Margin per side (cm):  0.3 Final wound size (cm):  1.2 Hemostasis achieved with:  pressure, aluminum chloride and electrodesiccation Outcome: patient tolerated procedure well with no complications   Post-procedure details: sterile dressing applied and wound care instructions given   Dressing type: bandage and petrolatum    Return for As scheduled 12/03/19 8:15am for 70m f/u.  I, Othelia Pulling, RMA, am acting as scribe for Sarina Ser, MD . Documentation: I have reviewed the above documentation for accuracy and completeness, and I agree with the above.  Sarina Ser, MD

## 2019-07-15 NOTE — Patient Instructions (Signed)

## 2019-07-16 ENCOUNTER — Encounter: Payer: Self-pay | Admitting: Dermatology

## 2019-12-03 ENCOUNTER — Encounter: Payer: Self-pay | Admitting: Dermatology

## 2019-12-03 ENCOUNTER — Other Ambulatory Visit: Payer: Self-pay

## 2019-12-03 ENCOUNTER — Ambulatory Visit: Payer: Medicare PPO | Admitting: Dermatology

## 2019-12-03 DIAGNOSIS — A630 Anogenital (venereal) warts: Secondary | ICD-10-CM | POA: Diagnosis not present

## 2019-12-03 DIAGNOSIS — Z85828 Personal history of other malignant neoplasm of skin: Secondary | ICD-10-CM

## 2019-12-03 DIAGNOSIS — L821 Other seborrheic keratosis: Secondary | ICD-10-CM | POA: Diagnosis not present

## 2019-12-03 DIAGNOSIS — Z1283 Encounter for screening for malignant neoplasm of skin: Secondary | ICD-10-CM

## 2019-12-03 DIAGNOSIS — L578 Other skin changes due to chronic exposure to nonionizing radiation: Secondary | ICD-10-CM

## 2019-12-03 DIAGNOSIS — L570 Actinic keratosis: Secondary | ICD-10-CM

## 2019-12-03 DIAGNOSIS — D485 Neoplasm of uncertain behavior of skin: Secondary | ICD-10-CM

## 2019-12-03 DIAGNOSIS — L814 Other melanin hyperpigmentation: Secondary | ICD-10-CM

## 2019-12-03 DIAGNOSIS — L820 Inflamed seborrheic keratosis: Secondary | ICD-10-CM | POA: Diagnosis not present

## 2019-12-03 DIAGNOSIS — D229 Melanocytic nevi, unspecified: Secondary | ICD-10-CM

## 2019-12-03 DIAGNOSIS — D1800 Hemangioma unspecified site: Secondary | ICD-10-CM

## 2019-12-03 NOTE — Progress Notes (Signed)
Follow-Up Visit   Subjective  Vernon Payne. is a 69 y.o. male who presents for the following: Annual Exam (UBSE - hx of SCC, patient has noticed a lesion on his right hand that has been there for about 3 months). The patient presents for Upper Body Skin Exam (UBSE) for skin cancer screening and mole check.  The following portions of the chart were reviewed this encounter and updated as appropriate:  Tobacco  Allergies  Meds  Problems  Med Hx  Surg Hx  Fam Hx     Review of Systems:  No other skin or systemic complaints except as noted in HPI or Assessment and Plan.  Objective  Well appearing patient in no apparent distress; mood and affect are within normal limits.  All skin waist up examined.  Objective  R hand dorsum: 0.7 cm hyperkeratotic papule   Objective  Chest (6): Erythematous keratotic or waxy stuck-on papule or plaque.   Objective  Face x 17 (17): Erythematous thin papules/macules with gritty scale.   Objective  Left Penile Shaft: 0.7 cm verrucous papule   Assessment & Plan  Neoplasm of uncertain behavior of skin R hand dorsum  Epidermal / dermal shaving  Lesion diameter (cm):  0.7 Informed consent: discussed and consent obtained   Timeout: patient name, date of birth, surgical site, and procedure verified   Procedure prep:  Patient was prepped and draped in usual sterile fashion Prep type:  Isopropyl alcohol Anesthesia: the lesion was anesthetized in a standard fashion   Anesthetic:  1% lidocaine w/ epinephrine 1-100,000 buffered w/ 8.4% NaHCO3 Instrument used: flexible razor blade   Hemostasis achieved with: pressure, aluminum chloride and electrodesiccation   Outcome: patient tolerated procedure well   Post-procedure details: sterile dressing applied and wound care instructions given   Dressing type: bandage and petrolatum    Destruction of lesion Complexity: extensive   Destruction method: electrodesiccation and curettage   Informed  consent: discussed and consent obtained   Timeout:  patient name, date of birth, surgical site, and procedure verified Procedure prep:  Patient was prepped and draped in usual sterile fashion Prep type:  Isopropyl alcohol Anesthesia: the lesion was anesthetized in a standard fashion   Anesthetic:  1% lidocaine w/ epinephrine 1-100,000 buffered w/ 8.4% NaHCO3 Curettage performed in three different directions: Yes   Electrodesiccation performed over the curetted area: Yes   Lesion length (cm):  0.7 Lesion width (cm):  0.7 Margin per side (cm):  0.2 Final wound size (cm):  1.1 Hemostasis achieved with:  pressure, aluminum chloride and electrodesiccation Outcome: patient tolerated procedure well with no complications   Post-procedure details: sterile dressing applied and wound care instructions given   Dressing type: bandage and petrolatum    Specimen 1 - Surgical pathology Differential Diagnosis: D48.5 r/o SCC vs other ED&C today  Check Margins: No 0.7 cm hyperkeratotic papule  Inflamed seborrheic keratosis (6) Chest  Destruction of lesion - Chest Complexity: simple   Destruction method: cryotherapy   Informed consent: discussed and consent obtained   Timeout:  patient name, date of birth, surgical site, and procedure verified Lesion destroyed using liquid nitrogen: Yes   Region frozen until ice ball extended beyond lesion: Yes   Outcome: patient tolerated procedure well with no complications   Post-procedure details: wound care instructions given    AK (actinic keratosis) (17) Face x 17  Destruction of lesion - Face x 17 Complexity: simple   Destruction method: cryotherapy   Informed consent: discussed and  consent obtained   Timeout:  patient name, date of birth, surgical site, and procedure verified Lesion destroyed using liquid nitrogen: Yes   Region frozen until ice ball extended beyond lesion: Yes   Outcome: patient tolerated procedure well with no complications     Post-procedure details: wound care instructions given    Condyloma Left Penile Shaft  Destruction of lesion - Left Penile Shaft Complexity: simple   Destruction method: cryotherapy   Informed consent: discussed and consent obtained   Timeout:  patient name, date of birth, surgical site, and procedure verified Lesion destroyed using liquid nitrogen: Yes   Region frozen until ice ball extended beyond lesion: Yes   Outcome: patient tolerated procedure well with no complications   Post-procedure details: wound care instructions given    Skin cancer screening   Lentigines - Scattered tan macules - Discussed due to sun exposure - Benign, observe - Call for any changes  Seborrheic Keratoses - Stuck-on, waxy, tan-brown papules and plaques  - Discussed benign etiology and prognosis. - Observe - Call for any changes  Melanocytic Nevi - Tan-brown and/or pink-flesh-colored symmetric macules and papules - Benign appearing on exam today - Observation - Call clinic for new or changing moles - Recommend daily use of broad spectrum spf 30+ sunscreen to sun-exposed areas.   Hemangiomas - Red papules - Discussed benign nature - Observe - Call for any changes  Actinic Damage - diffuse scaly erythematous macules with underlying dyspigmentation - Recommend daily broad spectrum sunscreen SPF 30+ to sun-exposed areas, reapply every 2 hours as needed.  - Call for new or changing lesions.  History of Squamous Cell Carcinoma of the Skin - No evidence of recurrence today - No lymphadenopathy - Recommend regular full body skin exams - Recommend daily broad spectrum sunscreen SPF 30+ to sun-exposed areas, reapply every 2 hours as needed.  - Call if any new or changing lesions are noted between office visits  Skin cancer screening performed today.  Return in about 3 months (around 03/04/2020). Sun exposed areas - AK's, ISK's   I, Rudell Cobb, CMA, am acting as scribe for Sarina Ser,  MD .  Documentation: I have reviewed the above documentation for accuracy and completeness, and I agree with the above.  Sarina Ser, MD

## 2019-12-03 NOTE — Patient Instructions (Addendum)
Wound Care Instructions  1. Cleanse wound gently with soap and water once a day then pat dry with clean gauze. Apply a thing coat of Petrolatum (petroleum jelly, "Vaseline") over the wound (unless you have an allergy to this). We recommend that you use a new, sterile tube of Vaseline. Do not pick or remove scabs. Do not remove the yellow or white "healing tissue" from the base of the wound.  2. Cover the wound with fresh, clean, nonstick gauze and secure with paper tape. You may use Band-Aids in place of gauze and tape if the would is small enough, but would recommend trimming much of the tape off as there is often too much. Sometimes Band-Aids can irritate the skin.  3. You should call the office for your biopsy report after 1 week if you have not already been contacted.  4. If you experience any problems, such as abnormal amounts of bleeding, swelling, significant bruising, significant pain, or evidence of infection, please call the office immediately.  5. FOR ADULT SURGERY PATIENTS: If you need something for pain relief you may take 1 extra strength Tylenol (acetaminophen) AND 2 Ibuprofen (200mg each) together every 4 hours as needed for pain. (do not take these if you are allergic to them or if you have a reason you should not take them.) Typically, you may only need pain medication for 1 to 3 days.      Electrodesiccation and Curettage ("Scrape and Burn") Wound Care Instructions  6. Leave the original bandage on for 24 hours if possible.  If the bandage becomes soaked or soiled before that time, it is OK to remove it and examine the wound.  A small amount of post-operative bleeding is normal.  If excessive bleeding occurs, remove the bandage, place gauze over the site and apply continuous pressure (no peeking) over the area for 30 minutes. If this does not work, please call our clinic as soon as possible or page your doctor if it is after hours.   7. Once a day, cleanse the wound with soap  and water. It is fine to shower. If a thick crust develops you may use a Q-tip dipped into dilute hydrogen peroxide (mix 1:1 with water) to dissolve it.  Hydrogen peroxide can slow the healing process, so use it only as needed.    8. After washing, apply petroleum jelly (Vaseline) or an antibiotic ointment if your doctor prescribed one for you, followed by a bandage.    9. For best healing, the wound should be covered with a layer of ointment at all times. If you are not able to keep the area covered with a bandage to hold the ointment in place, this may mean re-applying the ointment several times a day.  Continue this wound care until the wound has healed and is no longer open. It may take several weeks for the wound to heal and close.  Itching and mild discomfort is normal during the healing process.  If you have any discomfort, you can take Tylenol (acetaminophen) or ibuprofen as directed on the bottle. (Please do not take these if you have an allergy to them or cannot take them for another reason).  Some redness, tenderness and white or yellow material in the wound is normal healing.  If the area becomes very sore and red, or develops a thick yellow-green material (pus), it may be infected; please notify us.    Wound healing continues for up to one year following surgery. It is not   unusual to experience pain in the scar from time to time during the interval.  If the pain becomes severe or the scar thickens, you should notify the office.    A slight amount of redness in a scar is expected for the first six months.  After six months, the redness will fade and the scar will soften and fade.  The color difference becomes less noticeable with time.  If there are any problems, return for a post-op surgery check at your earliest convenience.  To improve the appearance of the scar, you can use silicone scar gel, cream, or sheets (such as Mederma or Serica) every night for up to one year. These are  available over the counter (without a prescription).  Please call our office at [phone] for any questions or concerns.  

## 2019-12-10 ENCOUNTER — Telehealth: Payer: Self-pay

## 2019-12-10 NOTE — Telephone Encounter (Signed)
Patient informed of pathology results 

## 2019-12-10 NOTE — Telephone Encounter (Signed)
-----   Message from Ralene Bathe, MD sent at 12/09/2019  9:14 AM EDT ----- Skin , right hand dorsum LICHEN SIMPLEX CHRONICUS  Benign thickened skin  Typically due to rubbing or scratching May recur

## 2020-03-11 ENCOUNTER — Ambulatory Visit: Payer: Medicare PPO | Admitting: Dermatology

## 2020-11-29 ENCOUNTER — Other Ambulatory Visit: Payer: Self-pay

## 2020-11-29 ENCOUNTER — Ambulatory Visit: Payer: Medicare PPO | Admitting: Dermatology

## 2020-11-29 DIAGNOSIS — Z85828 Personal history of other malignant neoplasm of skin: Secondary | ICD-10-CM

## 2020-11-29 DIAGNOSIS — Z1283 Encounter for screening for malignant neoplasm of skin: Secondary | ICD-10-CM | POA: Diagnosis not present

## 2020-11-29 DIAGNOSIS — L821 Other seborrheic keratosis: Secondary | ICD-10-CM

## 2020-11-29 DIAGNOSIS — D229 Melanocytic nevi, unspecified: Secondary | ICD-10-CM

## 2020-11-29 DIAGNOSIS — L570 Actinic keratosis: Secondary | ICD-10-CM

## 2020-11-29 DIAGNOSIS — L814 Other melanin hyperpigmentation: Secondary | ICD-10-CM

## 2020-11-29 DIAGNOSIS — L578 Other skin changes due to chronic exposure to nonionizing radiation: Secondary | ICD-10-CM | POA: Diagnosis not present

## 2020-11-29 DIAGNOSIS — D1800 Hemangioma unspecified site: Secondary | ICD-10-CM

## 2020-11-29 NOTE — Progress Notes (Signed)
Follow-Up Visit   Subjective  Vernon Payne. is a 70 y.o. male who presents for the following: Annual Exam (Mole check ). Hx of SCC  The patient presents for Total-Body Skin Exam (TBSE) for skin cancer screening and mole check.   The following portions of the chart were reviewed this encounter and updated as appropriate:   Tobacco  Allergies  Meds  Problems  Med Hx  Surg Hx  Fam Hx     Review of Systems:  No other skin or systemic complaints except as noted in HPI or Assessment and Plan.  Objective  Well appearing patient in no apparent distress; mood and affect are within normal limits.  A full examination was performed including scalp, head, eyes, ears, nose, lips, neck, chest, axillae, abdomen, back, buttocks, bilateral upper extremities, bilateral lower extremities, hands, feet, fingers, toes, fingernails, and toenails. All findings within normal limits unless otherwise noted below.  face,ears (17) Erythematous thin papules/macules with gritty scale.    Assessment & Plan  AK (actinic keratosis) (17) face,ears  Destruction of lesion - face,ears Complexity: simple   Destruction method: cryotherapy   Informed consent: discussed and consent obtained   Timeout:  patient name, date of birth, surgical site, and procedure verified Lesion destroyed using liquid nitrogen: Yes   Region frozen until ice ball extended beyond lesion: Yes   Outcome: patient tolerated procedure well with no complications   Post-procedure details: wound care instructions given    Skin cancer screening  Lentigines - Scattered tan macules - Due to sun exposure - Benign-appearing, observe - Recommend daily broad spectrum sunscreen SPF 30+ to sun-exposed areas, reapply every 2 hours as needed. - Call for any changes  Seborrheic Keratoses - Stuck-on, waxy, tan-brown papules and/or plaques  - Benign-appearing - Discussed benign etiology and prognosis. - Observe - Call for any  changes  Melanocytic Nevi - Tan-brown and/or pink-flesh-colored symmetric macules and papules - Benign appearing on exam today - Observation - Call clinic for new or changing moles - Recommend daily use of broad spectrum spf 30+ sunscreen to sun-exposed areas.   Hemangiomas - Red papules - Discussed benign nature - Observe - Call for any changes  Actinic Damage - Chronic condition, secondary to cumulative UV/sun exposure - diffuse scaly erythematous macules with underlying dyspigmentation - Recommend daily broad spectrum sunscreen SPF 30+ to sun-exposed areas, reapply every 2 hours as needed.  - Staying in the shade or wearing long sleeves, sun glasses (UVA+UVB protection) and wide brim hats (4-inch brim around the entire circumference of the hat) are also recommended for sun protection.  - Call for new or changing lesions.  History of Squamous Cell Carcinoma of the Skin Right chest, right preauricular ant to lobe  - No evidence of recurrence today - No lymphadenopathy - Recommend regular full body skin exams - Recommend daily broad spectrum sunscreen SPF 30+ to sun-exposed areas, reapply every 2 hours as needed.  - Call if any new or changing lesions are noted between office visits   Actinic Damage - Severe, confluent actinic changes with pre-cancerous actinic keratoses  - Severe, chronic, not at goal, secondary to cumulative UV radiation exposure over time - diffuse scaly erythematous macules and papules with underlying dyspigmentation - Discussed Prescription "Field Treatment" for Severe, Chronic Confluent Actinic Changes with Pre-Cancerous Actinic Keratoses PDT on face in 1 month  Field treatment involves treatment of an entire area of skin that has confluent Actinic Changes (Sun/ Ultraviolet light damage) and PreCancerous Actinic  Keratoses by method of PhotoDynamic Therapy (PDT) and/or prescription Topical Chemotherapy agents such as 5-fluorouracil,  5-fluorouracil/calcipotriene, and/or imiquimod.  The purpose is to decrease the number of clinically evident and subclinical PreCancerous lesions to prevent progression to development of skin cancer by chemically destroying early precancer changes that may or may not be visible.  It has been shown to reduce the risk of developing skin cancer in the treated area. As a result of treatment, redness, scaling, crusting, and open sores may occur during treatment course. One or more than one of these methods may be used and may have to be used several times to control, suppress and eliminate the PreCancerous changes. Discussed treatment course, expected reaction, and possible side effects. - Recommend daily broad spectrum sunscreen SPF 30+ to sun-exposed areas, reapply every 2 hours as needed.  - Staying in the shade or wearing long sleeves, sun glasses (UVA+UVB protection) and wide brim hats (4-inch brim around the entire circumference of the hat) are also recommended. - Call for new or changing lesions.   Skin cancer screening performed today.   Return in about 5 weeks (around 01/03/2021) for PDT on scalp in 1 month, 6 months AKS .  IMarye Round, CMA, am acting as scribe for Sarina Ser, MD .  Documentation: I have reviewed the above documentation for accuracy and completeness, and I agree with the above.  Sarina Ser, MD

## 2020-11-29 NOTE — Patient Instructions (Signed)

## 2020-11-30 ENCOUNTER — Encounter: Payer: Self-pay | Admitting: Dermatology

## 2021-01-03 ENCOUNTER — Ambulatory Visit: Payer: Medicare PPO

## 2021-01-03 ENCOUNTER — Other Ambulatory Visit: Payer: Self-pay

## 2021-01-03 DIAGNOSIS — L570 Actinic keratosis: Secondary | ICD-10-CM | POA: Diagnosis not present

## 2021-01-03 MED ORDER — AMINOLEVULINIC ACID HCL 20 % EX SOLR
1.0000 | Freq: Once | CUTANEOUS | Status: AC
Start: 2021-01-03 — End: 2021-01-03
  Administered 2021-01-03: 354 mg via TOPICAL

## 2021-01-03 NOTE — Progress Notes (Signed)
1. AK (actinic keratosis) Scalp  Photodynamic therapy - Scalp Procedure discussed: discussed risks, benefits, side effects. and alternatives   Prep: site scrubbed/prepped with acetone   Location:  Scalp Number of lesions:  Multiple Type of treatment:  Blue light Aminolevulinic Acid (see MAR for details): Levulan Number of Levulan sticks used:  1 Incubation time (minutes):  120 Number of minutes under lamp:  16 Number of seconds under lamp:  40 Cooling:  Floor fan Outcome: patient tolerated procedure well with no complications   Post-procedure details: sunscreen applied and aftercare instructions given to patient    Aminolevulinic Acid HCl 20 % SOLR 354 mg - Scalp

## 2021-01-03 NOTE — Patient Instructions (Signed)

## 2021-05-30 ENCOUNTER — Ambulatory Visit: Payer: Medicare PPO | Admitting: Dermatology

## 2021-05-30 DIAGNOSIS — L578 Other skin changes due to chronic exposure to nonionizing radiation: Secondary | ICD-10-CM

## 2021-05-30 DIAGNOSIS — L570 Actinic keratosis: Secondary | ICD-10-CM | POA: Diagnosis not present

## 2021-05-30 DIAGNOSIS — L821 Other seborrheic keratosis: Secondary | ICD-10-CM | POA: Diagnosis not present

## 2021-05-30 DIAGNOSIS — L814 Other melanin hyperpigmentation: Secondary | ICD-10-CM | POA: Diagnosis not present

## 2021-05-30 DIAGNOSIS — L820 Inflamed seborrheic keratosis: Secondary | ICD-10-CM | POA: Diagnosis not present

## 2021-05-30 DIAGNOSIS — D229 Melanocytic nevi, unspecified: Secondary | ICD-10-CM | POA: Diagnosis not present

## 2021-05-30 NOTE — Progress Notes (Signed)
? ?  Follow-Up Visit ?  ?Subjective  ?Vernon Payne. is a 71 y.o. male who presents for the following: Follow-up (Patient here today for 6 month AK follow up at face and ears. Patient does have a spot that itches at mid chest.  ?The patient has spots, moles and lesions to be evaluated, some may be new or changing and the patient has concerns that these could be cancer. ? ?The following portions of the chart were reviewed this encounter and updated as appropriate:  ? Tobacco  Allergies  Meds  Problems  Med Hx  Surg Hx  Fam Hx   ?  ?Review of Systems:  No other skin or systemic complaints except as noted in HPI or Assessment and Plan. ? ?Objective  ?Well appearing patient in no apparent distress; mood and affect are within normal limits. ? ?All skin waist up examined. ? ?chest ?Erythematous stuck-on, waxy papule or plaque ? ?right temple ?Erythematous thin papules/macules with gritty scale.  ? ? ?Assessment & Plan  ?Inflamed seborrheic keratosis ?chest ? ?Destruction of lesion - chest ?Complexity: simple   ?Destruction method: cryotherapy   ?Informed consent: discussed and consent obtained   ?Timeout:  patient name, date of birth, surgical site, and procedure verified ?Lesion destroyed using liquid nitrogen: Yes   ?Region frozen until ice ball extended beyond lesion: Yes   ?Outcome: patient tolerated procedure well with no complications   ?Post-procedure details: wound care instructions given   ? ?AK (actinic keratosis) ?right temple ? ?Destruction of lesion - right temple ?Complexity: simple   ?Destruction method: cryotherapy   ?Informed consent: discussed and consent obtained   ?Timeout:  patient name, date of birth, surgical site, and procedure verified ?Lesion destroyed using liquid nitrogen: Yes   ?Region frozen until ice ball extended beyond lesion: Yes   ?Outcome: patient tolerated procedure well with no complications   ?Post-procedure details: wound care instructions given   ? ?Lentigines ?- Scattered  tan macules ?- Due to sun exposure ?- Benign-appering, observe ?- Recommend daily broad spectrum sunscreen SPF 30+ to sun-exposed areas, reapply every 2 hours as needed. ?- Call for any changes ? ?Seborrheic Keratoses ?- Stuck-on, waxy, tan-brown papules and/or plaques  ?- Benign-appearing ?- Discussed benign etiology and prognosis. ?- Observe ?- Call for any changes ? ?Melanocytic Nevi ?- Tan-brown and/or pink-flesh-colored symmetric macules and papules ?- Benign appearing on exam today ?- Observation ?- Call clinic for new or changing moles ?- Recommend daily use of broad spectrum spf 30+ sunscreen to sun-exposed areas.  ? ?Actinic Damage ?- chronic, secondary to cumulative UV radiation exposure/sun exposure over time ?- diffuse scaly erythematous macules with underlying dyspigmentation ?- Recommend daily broad spectrum sunscreen SPF 30+ to sun-exposed areas, reapply every 2 hours as needed.  ?- Recommend staying in the shade or wearing long sleeves, sun glasses (UVA+UVB protection) and wide brim hats (4-inch brim around the entire circumference of the hat). ?- Call for new or changing lesions. ? ?Return in about 1 month (around 06/29/2021) for TBSE. ? ?Graciella Belton, RMA, am acting as scribe for Sarina Ser, MD . ?Documentation: I have reviewed the above documentation for accuracy and completeness, and I agree with the above. ? ?Sarina Ser, MD ? ?

## 2021-05-30 NOTE — Patient Instructions (Signed)
Cryotherapy Aftercare ? ?Wash gently with soap and water everyday.   ?Apply Vaseline and Band-Aid daily until healed.  ? ?Melanoma ABCDEs ? ?Melanoma is the most dangerous type of skin cancer, and is the leading cause of death from skin disease.  You are more likely to develop melanoma if you: ?Have light-colored skin, light-colored eyes, or red or blond hair ?Spend a lot of time in the sun ?Tan regularly, either outdoors or in a tanning bed ?Have had blistering sunburns, especially during childhood ?Have a close family member who has had a melanoma ?Have atypical moles or large birthmarks ? ?Early detection of melanoma is key since treatment is typically straightforward and cure rates are extremely high if we catch it early.  ? ?The first sign of melanoma is often a change in a mole or a new dark spot.  The ABCDE system is a way of remembering the signs of melanoma. ? ?A for asymmetry:  The two halves do not match. ?B for border:  The edges of the growth are irregular. ?C for color:  A mixture of colors are present instead of an even brown color. ?D for diameter:  Melanomas are usually (but not always) greater than 24m - the size of a pencil eraser. ?E for evolution:  The spot keeps changing in size, shape, and color. ? ?Please check your skin once per month between visits. You can use a small mirror in front and a large mirror behind you to keep an eye on the back side or your body.  ? ?If you see any new or changing lesions before your next follow-up, please call to schedule a visit. ? ?Please continue daily skin protection including broad spectrum sunscreen SPF 30+ to sun-exposed areas, reapplying every 2 hours as needed when you're outdoors.   ? ?If You Need Anything After Your Visit ? ?If you have any questions or concerns for your doctor, please call our main line at [phone] press option 4 to reach your doctor's medical assistant. If no one answers, please leave a voicemail as directed and we will  return your call as soon as possible. Messages left after 4 pm will be answered the following business day.  ? ?You may also send [place] message via MyChart. We typically respond to MyChart messages within 1-2 business days. ? ?For prescription refills, please ask your pharmacy to contact our office. Our fax number is [phone] ? ?If you have an urgent issue when the clinic is closed that cannot wait until the next business day, you can page your doctor at the number below.   ? ?Please note that while we do our best to be available for urgent issues outside of office hours, we are not available 24/7.  ? ?If you have an urgent issue and are unable to reach [place] you may choose to seek medical care at your doctor's office, retail clinic, urgent care center, or emergency room. ? ?If you have a medical emergency, please immediately call 911 or go to the emergency department. ? ?Pager Numbers ? ?- Dr. KNehemiah Massed [phone]? ?- Dr. MLaurence Ferrari [phone]? ?- Dr. SNicole Kindred [phone]? ?In the event of inclement weather, please call our main line at [phone] an update on the status of any delays or closures. ? ?Dermatology Medication Tips: ?Please keep the boxes that topical medications come in in order to help keep track of the instructions about where and how to use these. Pharmacies typically print the medication instructions  only on the boxes and not directly on the medication tubes.  ? ?If your medication is too expensive, please contact our office at [phone] option 4 or send [place] a message through [place].  ? ?We are unable to tell what your co-pay for medications will be in advance as this is different depending on your insurance coverage. However, we may be able to find a substitute medication at lower cost or fill out paperwork to get insurance to cover a needed medication.  ? ?If a prior authorization is required to get your medication covered by your insurance company, please allow [place] 1-2 business  days to complete this process. ? ?Drug prices often vary depending on where the prescription is filled and some pharmacies may offer cheaper prices. ? ?The website www.goodrx.com contains coupons for medications through different pharmacies. The prices here do not account for what the cost may be with help from insurance (it may be cheaper with your insurance), but the website can give you the price if you did not use any insurance.  ?- You can print the associated coupon and take it with your prescription to the pharmacy.  ?- You may also stop by our office during regular business hours and pick up a GoodRx coupon card.  ?- If you need your prescription sent electronically to a different pharmacy, notify our office through [facility] or by phone at [phone] option 4. ? ? ? ? ?Si Usted Necesita Algo Despu?s de Su Visita ? ?Tambi?n puede enviarnos un mensaje a trav?s de MyChart. Por lo general respondemos a los mensajes de MyChart en el transcurso de 1 a 2 d?as h?biles. ? ?Para renovar recetas, por favor pida a su farmacia que se ponga en contacto con nuestra oficina. Nuestro n?mero de fax es el [phone]. ? ?Si tiene un asunto urgente cuando la cl?nica est? cerrada y que no puede esperar hasta el siguiente d?a h?bil, puede llamar/localizar a su doctor(a) al n?mero que aparece a continuaci?n.  ? ?Por favor, tenga en cuenta que aunque hacemos todo lo posible para estar disponibles para asuntos urgentes fuera del horario de oficina, no estamos disponibles las 24 horas del d?a, los 7 d?as de la semana.  ? ?Si tiene un problema urgente y no puede comunicarse con nosotros, puede optar por buscar atenci?n m?dica  en el consultorio de su doctor(a), en una cl?nica privada, en un centro de atenci?n urgente o en una sala de emergencias. ? ?Si tiene Engineer, maintenance (IT) m?dica, por favor llame inmediatamente al 911 o vaya a la sala de emergencias. ? ?N?meros de b?per ? ?- Dr. Nehemiah Massed: [phone] ? ?- Dra. Moye:  [phone] ? ?- Dra. Nicole Kindred: [phone] ? ?En caso de inclemencias del tiempo, por favor llame a nuestra l?nea principal al [phone] para una actualizaci?n sobre el estado de cualquier retraso o cierre. ? ?Consejos para la medicaci?n en dermatolog?a: ?Por favor, guarde las cajas en las que vienen los medicamentos de uso t?pico para ayudarle a seguir las instrucciones sobre d?nde y c?mo usarlos. Las farmacias generalmente imprimen las instrucciones del medicamento s?lo en las cajas y no directamente en los tubos del [place].  ? ?Si su medicamento es muy caro, por favor, p?ngase en contacto con Zigmund Daniel llamando al [phone] y presione la opci?n 4 o env?enos un mensaje a trav?s de MyChart.  ? ?No podemos decirle cu?l ser? su copago por los medicamentos por adelantado ya que esto es diferente dependiendo de la cobertura de su seguro. Sin embargo, es  posible que podamos encontrar un medicamento sustituto a Electrical engineer un formulario para que el seguro cubra el medicamento que se considera necesario.  ? ?Si se requiere Ardelia Mems autorizaci?n previa para que su compa??a de seguros [place] su medicamento, por favor perm?tanos de 1 a 2 d?as h?biles para completar este proceso. ? ?Los precios de los medicamentos var?an con frecuencia dependiendo del Environmental consultant de d?nde se surte la receta y alguna farmacias pueden ofrecer precios m?s baratos. ? ?El sitio web www.goodrx.com tiene cupones para medicamentos de Airline pilot. Los precios aqu? no tienen en cuenta lo que podr?a costar con la ayuda del seguro (puede ser m?s barato con su seguro), pero el sitio web puede darle el precio si no utiliz? ning?n seguro.  ?- Puede imprimir el cup?n correspondiente y llevarlo con su receta a la farmacia.  ?- Tambi?n puede pasar por nuestra oficina durante el horario de atenci?n regular y recoger una tarjeta de cupones de GoodRx.  ?- Si necesita que su receta se env?e electr?nicamente a Chiropodist,  informe a nuestra oficina a trav?s de MyChart de [place] o por tel?fono llamando al [phone] y presione la opci?n 4. ? ?

## 2021-05-31 ENCOUNTER — Encounter: Payer: Self-pay | Admitting: Dermatology

## 2021-07-20 ENCOUNTER — Emergency Department: Payer: Medicare PPO

## 2021-07-20 ENCOUNTER — Other Ambulatory Visit: Payer: Self-pay

## 2021-07-20 ENCOUNTER — Observation Stay
Admission: EM | Admit: 2021-07-20 | Discharge: 2021-07-21 | Disposition: A | Discharge status: Home / Self Care | Payer: Medicare PPO | Attending: Internal Medicine | Admitting: Internal Medicine

## 2021-07-20 ENCOUNTER — Observation Stay: Payer: Medicare PPO

## 2021-07-20 ENCOUNTER — Encounter: Payer: Self-pay | Admitting: Radiology

## 2021-07-20 DIAGNOSIS — Z96652 Presence of left artificial knee joint: Secondary | ICD-10-CM | POA: Diagnosis not present

## 2021-07-20 DIAGNOSIS — E119 Type 2 diabetes mellitus without complications: Secondary | ICD-10-CM | POA: Insufficient documentation

## 2021-07-20 DIAGNOSIS — Z7982 Long term (current) use of aspirin: Secondary | ICD-10-CM | POA: Diagnosis not present

## 2021-07-20 DIAGNOSIS — Z85828 Personal history of other malignant neoplasm of skin: Secondary | ICD-10-CM | POA: Insufficient documentation

## 2021-07-20 DIAGNOSIS — I639 Cerebral infarction, unspecified: Secondary | ICD-10-CM | POA: Diagnosis not present

## 2021-07-20 DIAGNOSIS — I6381 Other cerebral infarction due to occlusion or stenosis of small artery: Principal | ICD-10-CM | POA: Insufficient documentation

## 2021-07-20 DIAGNOSIS — R531 Weakness: Secondary | ICD-10-CM | POA: Diagnosis present

## 2021-07-20 DIAGNOSIS — Z7985 Long-term (current) use of injectable non-insulin antidiabetic drugs: Secondary | ICD-10-CM | POA: Diagnosis not present

## 2021-07-20 DIAGNOSIS — Z7984 Long term (current) use of oral hypoglycemic drugs: Secondary | ICD-10-CM | POA: Diagnosis not present

## 2021-07-20 DIAGNOSIS — G8191 Hemiplegia, unspecified affecting right dominant side: Secondary | ICD-10-CM

## 2021-07-20 DIAGNOSIS — I10 Essential (primary) hypertension: Secondary | ICD-10-CM | POA: Diagnosis not present

## 2021-07-20 DIAGNOSIS — Z79899 Other long term (current) drug therapy: Secondary | ICD-10-CM | POA: Diagnosis not present

## 2021-07-20 LAB — COMPREHENSIVE METABOLIC PANEL
ALT: 18 U/L (ref 0–44)
AST: 23 U/L (ref 15–41)
Albumin: 4.6 g/dL (ref 3.5–5.0)
Alkaline Phosphatase: 60 U/L (ref 38–126)
Anion gap: 9 (ref 5–15)
BUN: 11 mg/dL (ref 8–23)
CO2: 26 mmol/L (ref 22–32)
Calcium: 10.1 mg/dL (ref 8.9–10.3)
Chloride: 101 mmol/L (ref 98–111)
Creatinine, Ser: 1.02 mg/dL (ref 0.61–1.24)
GFR, Estimated: >60 mL/min (ref >60)
Glucose, Bld: 164 mg/dL — ABNORMAL HIGH (ref 70–99)
Potassium: 4.2 mmol/L (ref 3.5–5.1)
Sodium: 136 mmol/L (ref 135–145)
Total Bilirubin: 0.8 mg/dL (ref 0.3–1.2)
Total Protein: 7.5 g/dL (ref 6.5–8.1)

## 2021-07-20 LAB — DIFFERENTIAL
Abs Immature Granulocytes: 0.05 10*3/uL (ref 0.00–0.07)
Basophils Absolute: 0.1 10*3/uL (ref 0.0–0.1)
Basophils Relative: 1 %
Eosinophils Absolute: 0.3 10*3/uL (ref 0.0–0.5)
Eosinophils Relative: 5 %
Immature Granulocytes: 1 %
Lymphocytes Relative: 23 %
Lymphs Abs: 1.7 10*3/uL (ref 0.7–4.0)
Monocytes Absolute: 0.5 10*3/uL (ref 0.1–1.0)
Monocytes Relative: 6 %
Neutro Abs: 4.9 10*3/uL (ref 1.7–7.7)
Neutrophils Relative %: 64 %

## 2021-07-20 LAB — CBC
HCT: 42.1 % (ref 39.0–52.0)
Hemoglobin: 13.7 g/dL (ref 13.0–17.0)
MCH: 28.6 pg (ref 26.0–34.0)
MCHC: 32.5 g/dL (ref 30.0–36.0)
MCV: 87.9 fL (ref 80.0–100.0)
Platelets: 291 10*3/uL (ref 150–400)
RBC: 4.79 MIL/uL (ref 4.22–5.81)
RDW: 12.7 % (ref 11.5–15.5)
WBC: 7.6 10*3/uL (ref 4.0–10.5)
nRBC: 0 % (ref 0.0–0.2)

## 2021-07-20 LAB — GLUCOSE, CAPILLARY
Glucose-Capillary: 131 mg/dL — ABNORMAL HIGH (ref 70–99)
Glucose-Capillary: 155 mg/dL — ABNORMAL HIGH (ref 70–99)

## 2021-07-20 LAB — LIPID PANEL
Cholesterol: 145 mg/dL (ref 0–200)
HDL: 45 mg/dL (ref >40)
LDL Cholesterol: 53 mg/dL (ref 0–99)
Total CHOL/HDL Ratio: 3.2 RATIO
Triglycerides: 233 mg/dL — ABNORMAL HIGH (ref <150)
VLDL: 47 mg/dL — ABNORMAL HIGH (ref 0–40)

## 2021-07-20 LAB — APTT: aPTT: 30 seconds (ref 24–36)

## 2021-07-20 LAB — PROTIME-INR
INR: 0.9 (ref 0.8–1.2)
Prothrombin Time: 11.8 seconds (ref 11.4–15.2)

## 2021-07-20 LAB — CBG MONITORING, ED
Glucose-Capillary: 169 mg/dL — ABNORMAL HIGH (ref 70–99)
Glucose-Capillary: 123 mg/dL — ABNORMAL HIGH (ref 70–99)

## 2021-07-20 MED ORDER — ACETAMINOPHEN 160 MG/5ML PO SOLN: 650.0000 mg | ORAL | Status: DC | PRN

## 2021-07-20 MED ORDER — SIMVASTATIN 20 MG PO TABS
20.0000 mg | ORAL_TABLET | Freq: Every day | ORAL | Status: DC
  Administered 2021-07-20: 20 mg via ORAL
  Filled 2021-07-20: qty 1

## 2021-07-20 MED ORDER — SODIUM CHLORIDE 0.9% FLUSH
3.0000 mL | Freq: Once | INTRAVENOUS | Status: AC
  Administered 2021-07-20: 3 mL via INTRAVENOUS

## 2021-07-20 MED ORDER — ACETAMINOPHEN 650 MG RE SUPP: 650.0000 mg | RECTAL | Status: DC | PRN

## 2021-07-20 MED ORDER — ASPIRIN 81 MG PO TBEC
81.0000 mg | DELAYED_RELEASE_TABLET | Freq: Every day | ORAL | Status: DC
  Administered 2021-07-21: 81 mg via ORAL
  Filled 2021-07-20: qty 1

## 2021-07-20 MED ORDER — LATANOPROST 0.005 % OP SOLN: 1.0000 [drp] | Freq: Every day | OPHTHALMIC | Status: DC

## 2021-07-20 MED ORDER — STROKE: EARLY STAGES OF RECOVERY BOOK: Freq: Once | Status: AC

## 2021-07-20 MED ORDER — HYDRALAZINE HCL 20 MG/ML IJ SOLN: 10.0000 mg | Freq: Four times a day (QID) | INTRAMUSCULAR | Status: DC | PRN

## 2021-07-20 MED ORDER — LORAZEPAM 2 MG/ML IJ SOLN
1.0000 mg | Freq: Once | INTRAMUSCULAR | Status: AC
  Administered 2021-07-20: 1 mg via INTRAVENOUS
  Filled 2021-07-20: qty 1

## 2021-07-20 MED ORDER — INSULIN ASPART 100 UNIT/ML IJ SOLN
0.0000 [IU] | Freq: Three times a day (TID) | INTRAMUSCULAR | Status: DC
  Administered 2021-07-20 (×2): 1 [IU] via SUBCUTANEOUS
  Administered 2021-07-21: 3 [IU] via SUBCUTANEOUS
  Filled 2021-07-20 (×3): qty 1

## 2021-07-20 MED ORDER — ENOXAPARIN SODIUM 40 MG/0.4ML IJ SOSY
40.0000 mg | PREFILLED_SYRINGE | INTRAMUSCULAR | Status: DC
  Administered 2021-07-20: 40 mg via SUBCUTANEOUS
  Filled 2021-07-20: qty 0.4

## 2021-07-20 MED ORDER — DORZOLAMIDE HCL-TIMOLOL MAL 2-0.5 % OP SOLN: 1.0000 [drp] | Freq: Every day | OPHTHALMIC | Status: DC

## 2021-07-20 MED ORDER — IOHEXOL 350 MG/ML SOLN
75.0000 mL | Freq: Once | INTRAVENOUS | Status: AC | PRN
  Administered 2021-07-20: 75 mL via INTRAVENOUS

## 2021-07-20 MED ORDER — ACETAMINOPHEN 325 MG PO TABS
650.0000 mg | ORAL_TABLET | ORAL | Status: DC | PRN
  Administered 2021-07-20: 650 mg via ORAL
  Filled 2021-07-20: qty 2

## 2021-07-20 MED ORDER — CLOPIDOGREL BISULFATE 75 MG PO TABS
75.0000 mg | ORAL_TABLET | Freq: Every day | ORAL | Status: DC
  Administered 2021-07-20 – 2021-07-21 (×2): 75 mg via ORAL
  Filled 2021-07-20 (×2): qty 1

## 2021-07-20 MED ORDER — GLIMEPIRIDE 4 MG PO TABS
4.0000 mg | ORAL_TABLET | Freq: Every day | ORAL | Status: DC
  Administered 2021-07-21: 4 mg via ORAL
  Filled 2021-07-20: qty 1

## 2021-07-20 MED ORDER — INSULIN ASPART 100 UNIT/ML IJ SOLN: 0.0000 [IU] | Freq: Every day | INTRAMUSCULAR | Status: DC

## 2021-07-20 NOTE — H&P (Signed)
History and Physical    Patient: Vernon Payne. ZOX:[id number] DOB: [birth year] DOA: 07/20/2021 DOS: the patient was seen and examined on 07/20/2021 PCP: Rusty Aus, MD  Patient coming from: Home  Chief Complaint:  right-sided weakness, difficulty speaking and difficulty writing started this morning  HPI: Vernon Payne. is a 71 y.o. male with medical history significant of hypertension, diabetes, hyperlipidemia, sleep apnea comes to the emergency room after he started noticing right-sided weakness after he went to work. He started noticing decreased grip of his pen while writing and had on occasion slurred speech. He started filling heaviness on the right side called his wife and was brought to the emergency room. It was evaluated by neurologist Dr. Cheral Marker. CT head negative for acute stroke. Symptoms have improved during the ER course. He still has mild right facial weakness. Patient received aspirin and Plavix. He is being admitted for further evaluation management.  Review of Systems: As mentioned in the history of present illness. All other systems reviewed and are negative. Past Medical History:  Diagnosis Date   Actinic keratosis    Arthritis    Diabetes mellitus without complication (HCC)    GERD (gastroesophageal reflux disease)    Hard of hearing    left hearing aid   HLD (hyperlipidemia)    HTN (hypertension)    Hypotension, postural    IBS (irritable bowel syndrome)    Sleep apnea    Squamous cell carcinoma of skin 01/20/2010   Right chest. Well differentiated. Excised: 01/27/2010   Squamous cell carcinoma of skin 07/15/2019   R preauricular ant to lobe   Wears dentures    partial upper, implants on bottom   Past Surgical History:  Procedure Laterality Date   APPENDECTOMY     BACK SURGERY     lumbar   CATARACT EXTRACTION W/PHACO Right 08/29/2017   Procedure: CATARACT EXTRACTION PHACO AND INTRAOCULAR LENS PLACEMENT ([place]) RIGHT DIABETIC;  Surgeon: Leandrew Koyanagi, MD;  Location: [facility];  Service: Ophthalmology;  Laterality: Right;  ISTENT INJECT   CATARACT EXTRACTION W/PHACO Left 11/21/2017   Procedure: CATARACT EXTRACTION PHACO AND INTRAOCULAR LENS PLACEMENT ([facility]);  Surgeon: Leandrew Koyanagi, MD;  Location: [facility];  Service: Ophthalmology;  Laterality: Left;   EYE SURGERY Left 11/21/2017   cataract excision   INSERTION OF ANTERIOR SEGMENT AQUEOUS DRAINAGE DEVICE (ISTENT) Right 08/29/2017   Procedure: (ISTENT);  Surgeon: Leandrew Koyanagi, MD;  Location: [facility];  Service: Ophthalmology;  Laterality: Right;  Diabetic - oral meds   INSERTION OF ANTERIOR SEGMENT AQUEOUS DRAINAGE DEVICE (ISTENT) Left 11/21/2017   Procedure: INSERTION OF ANTERIOR SEGMENT AQUEOUS DRAINAGE DEVICE ([place])  INJECT DIABETES ISTENT INJECT;  Surgeon: Leandrew Koyanagi, MD;  Location: [facility];  Service: Ophthalmology;  Laterality: Left;  Diabetic - oral meds   JOINT REPLACEMENT     left knee replacement   KYPHOPLASTY N/A 03/28/2019   Procedure: L1 KYPHOPLASTY;  Surgeon: Hessie Knows, MD;  Location: [facility];  Service: Orthopedics;  Laterality: N/A;   LASIK     ROTATOR CUFF REPAIR     TONSILLECTOMY     Social History:  reports that he has never smoked. He has never used smokeless tobacco. He reports that he does not drink alcohol and does not use drugs.  Allergies  Allergen Reactions   Morphine And Related Nausea And Vomiting    Family History  Problem Relation Age of Onset   Diabetes Father     Prior to  Admission medications   Medication Sig Start Date End Date Taking? Authorizing Provider  aspirin EC 81 MG tablet Take 81 mg by mouth daily.   Yes [provider]  diphenhydramine-acetaminophen (TYLENOL PM) 25-500 MG TABS tablet Take 2 tablets by mouth at bedtime.    Yes [provider]  dorzolamide-timolol (COSOPT) 22.3-6.8 MG/ML ophthalmic solution Place 1 drop into both eyes daily.  01/31/19  Yes [provider]  glimepiride (AMARYL) 2 MG tablet Take 4 mg by mouth daily.  04/13/17 07/20/21 Yes [provider]  latanoprost (XALATAN) 0.005 % ophthalmic solution Place 1 drop into both eyes at bedtime.   Yes [provider]  lisinopril (PRINIVIL,ZESTRIL) 10 MG tablet Take 10 mg by mouth daily.  12/10/17  Yes [provider]  metFORMIN (GLUCOPHAGE) 500 MG tablet Take 1,000 mg by mouth 2 (two) times daily with a meal.    Yes [provider]  simvastatin (ZOCOR) 20 MG tablet Take 20 mg by mouth at bedtime. 01/21/19  Yes [provider]  TRULICITY 4.26 ST/4.1DQ SOPN Inject 0.75 mg into the skin every Sunday. 12/31/18   [provider]    Physical Exam: Vitals:   07/20/21 0934 07/20/21 1010 07/20/21 1100  BP: (!) 176/104    Pulse: 80 80   Resp: 18 16   Temp: 98 F (36.7 C)    SpO2: 100% 97%   Weight:   97.4 kg  Height:   6' 3.51" (1.918 m)   Physical Exam Constitutional:      Appearance: He is well-developed.  HENT:     Head: Normocephalic.  Eyes:     Pupils: Pupils are equal, round, and reactive to light.  Cardiovascular:     Rate and Rhythm: Normal rate and regular rhythm.     Heart sounds: Normal heart sounds.  Pulmonary:     Effort: Pulmonary effort is normal.     Breath sounds: Normal breath sounds.  Abdominal:     General: Bowel sounds are normal.     Palpations: Abdomen is soft.  Musculoskeletal:        General: Normal range of motion.     Cervical back: Normal range of motion and neck supple.  Skin:    General: Skin is warm and dry.  Neurological:     Mental Status: He is alert and oriented to person, place, and time.     Cranial Nerves: Cranial nerve deficit present.     Comments: Mild right facial droop     Assessment and Plan: Vernon Payne. is a 71 y.o. male with medical history significant of hypertension, diabetes, hyperlipidemia, sleep apnea comes to the emergency room after he  started noticing right-sided weakness after he went to work. He started noticing decreased grip of his pen while writing and had on occasion slurred speech. He started filling heaviness on the right side called his wife and was brought to the emergency room.  Acute on said right-sided weakness with slurred speech and facial weakness -- CT head negative for stroke -- admitted for overnight observation -- seen by neurology-- continue aspirin and added Plavix 75 mg PO daily -- continue statins -- PT OT -- patient swallowing well -- MRI brain -- A1c  Hypertension -- will allow permissive hypertension -- PRN hydralazine  type II diabetes without complication -- check Q2W -- sliding scale for now  Hyperlipidemia -- on statins    Advance Care Planning:   Code Status: Full Code full  Consults: neurology  Family Communication: wife at bedside  Severity of Illness: The appropriate patient status for this patient is OBSERVATION. Observation status is judged to be reasonable and necessary in order to provide the required intensity of service to ensure the patient's safety. The patient's presenting symptoms, physical exam findings, and initial radiographic and laboratory data in the context of their medical condition is felt to place them at decreased risk for further clinical deterioration. Furthermore, it is anticipated that the patient will be medically stable for discharge from the hospital within 2 midnights of admission.   Author: Fritzi Mandes, MD 07/20/2021 11:56 AM  For on call review [email or web address].

## 2021-07-20 NOTE — ED Notes (Addendum)
MD Cheral Marker made aware of patient having slight arm drift with neuro exam. No change in treatment plan at this time. Pt remains "too mild to treat"   Continue q30 NIHSS/VS until 1230, and then q2 x 12

## 2021-07-20 NOTE — ED Notes (Signed)
Code stroke called to ED secretary and CT called for room.

## 2021-07-20 NOTE — Consult Note (Signed)
NEURO HOSPITALIST CONSULT NOTE   Requestig physician: Dr. Cherylann Banas  Reason for Consult: Acute onset of right-handed writing difficulty, slurred speech and mild right facial weakness  History obtained from:  Patient and Chart     HPI:                                                                                                                                          Vernon Payne. is an 71 y.o. male with a PMHx of DM, HLD, HTN and sleep apnea, presenting with acute onset of right-handed writing difficulty, slurred speech and mild right facial weakness. LKN was 0800. He has no prior history of stroke. Initially he did not want to come in to be evaluated, but his wife convinced him.   Glucose 164; chemistries otherwise normal. CBC normal.   CT head is normal for age. ASPECTS is 10.  Past Medical History:  Diagnosis Date   Actinic keratosis    Arthritis    Diabetes mellitus without complication (HCC)    GERD (gastroesophageal reflux disease)    Hard of hearing    left hearing aid   HLD (hyperlipidemia)    HTN (hypertension)    Hypotension, postural    IBS (irritable bowel syndrome)    Sleep apnea    Squamous cell carcinoma of skin 01/20/2010   Right chest. Well differentiated. Excised: 01/27/2010   Squamous cell carcinoma of skin 07/15/2019   R preauricular ant to lobe   Wears dentures    partial upper, implants on bottom    Past Surgical History:  Procedure Laterality Date   APPENDECTOMY     BACK SURGERY     lumbar   CATARACT EXTRACTION W/PHACO Right 08/29/2017   Procedure: CATARACT EXTRACTION PHACO AND INTRAOCULAR LENS PLACEMENT ([place]) RIGHT DIABETIC;  Surgeon: Leandrew Koyanagi, MD;  Location: [facility];  Service: Ophthalmology;  Laterality: Right;  ISTENT INJECT   CATARACT EXTRACTION W/PHACO Left 11/21/2017   Procedure: CATARACT EXTRACTION PHACO AND INTRAOCULAR LENS PLACEMENT ([facility]);  Surgeon: Leandrew Koyanagi, MD;  Location: [place];  Service: Ophthalmology;  Laterality: Left;   EYE SURGERY Left 11/21/2017   cataract excision   INSERTION OF ANTERIOR SEGMENT AQUEOUS DRAINAGE DEVICE (ISTENT) Right 08/29/2017   Procedure: (ISTENT);  Surgeon: Leandrew Koyanagi, MD;  Location: [place];  Service: Ophthalmology;  Laterality: Right;  Diabetic - oral meds   INSERTION OF ANTERIOR SEGMENT AQUEOUS DRAINAGE DEVICE (ISTENT) Left 11/21/2017   Procedure: INSERTION OF ANTERIOR SEGMENT AQUEOUS DRAINAGE DEVICE ([place])  INJECT DIABETES ISTENT INJECT;  Surgeon: Leandrew Koyanagi, MD;  Location: [place];  Service: Ophthalmology;  Laterality: Left;  Diabetic - oral meds   JOINT REPLACEMENT     left knee replacement   KYPHOPLASTY N/A 03/28/2019  Procedure: L1 KYPHOPLASTY;  Surgeon: Hessie Knows, MD;  Location: [facility];  Service: Orthopedics;  Laterality: N/A;   LASIK     ROTATOR CUFF REPAIR     TONSILLECTOMY      Family History  Problem Relation Age of Onset   Diabetes Father               Social History:  reports that he has never smoked. He has never used smokeless tobacco. He reports that he does not drink alcohol and does not use drugs.  Allergies  Allergen Reactions   Morphine And Related Nausea And Vomiting    MEDICATIONS:                                                                                                                     No current facility-administered medications on file prior to encounter.   Current Outpatient Medications on File Prior to Encounter  Medication Sig Dispense Refill   amLODipine (NORVASC) 5 MG tablet Take 5 mg by mouth at bedtime.      aspirin EC 81 MG tablet Take 81 mg by mouth daily.     cyclobenzaprine (FLEXERIL) 5 MG tablet Take 1 tablet (5 mg total) by mouth 3 (three) times daily as needed for muscle spasms. 30 tablet 0   diphenhydramine-acetaminophen (TYLENOL PM) 25-500 MG TABS tablet Take 2 tablets by mouth at bedtime.      dorzolamide-timolol  (COSOPT) 22.3-6.8 MG/ML ophthalmic solution Place 1 drop into both eyes daily.     glimepiride (AMARYL) 2 MG tablet Take 4 mg by mouth daily.      latanoprost (XALATAN) 0.005 % ophthalmic solution Place 1 drop into both eyes at bedtime.     Lidocaine 4 % PTCH Place 1 patch onto the skin daily. OTC     lisinopril (PRINIVIL,ZESTRIL) 10 MG tablet Take 10 mg by mouth daily.      metFORMIN (GLUCOPHAGE) 500 MG tablet Take 1,000 mg by mouth 2 (two) times daily with a meal.      Multiple Vitamin (MULTIVITAMIN WITH MINERALS) TABS tablet Take 1 tablet by mouth daily.     simvastatin (ZOCOR) 20 MG tablet Take 20 mg by mouth at bedtime.     traMADol (ULTRAM) 50 MG tablet Take 1 tablet (50 mg total) by mouth every 6 (six) hours as needed for up to 30 doses. 30 tablet 0   TRULICITY 7.86 VE/7.2CN SOPN Inject 0.75 mg into the skin every Sunday.       ROS:  Denies any difficulty with speech comprehension or word-finding. Has symptom of "film over my eye" usually every morning which again occurred today. Other symptoms as per HPI. Detailed ROS deferred due to acuity of presentation.    BP (!) 176/104   Pulse 80   Temp 98 F (36.7 C)   Resp 18   SpO2 100%     General Examination:                                                                                                       Physical Exam  HEENT-  [place]/AT   Lungs- Respirations unlabored Extremities- No edema  Neurological Examination Mental Status: Awake and alert. Oriented x 5. Speech fluent without errors of grammar or syntax, but with slight dysarthria. Naming and comprehension intact.  Cranial Nerves: II: Visual fields intact all 4 quadrants of each eye. No extinction to DSS. PERRL.   III,IV, VI: No ptosis. EOMI. No nystagmus.  V: Temp sensation equal bilaterally  VII: Subtle right perioral weakness, positive  for air leakage from right side of mouth with cheek-puff test.  VIII: HOH IX,X: No hypophonia or hoarseness XI: Symmetric XII: Midline tongue extension Motor: Right : Upper extremity   5/5    Left:     Upper extremity   5/5  Lower extremity   5/5     Lower extremity   5/5 No pronator drift Sensory: Temp and light touch intact throughout, bilaterally. No extinction to DSS.  Deep Tendon Reflexes: 1+ and symmetric throughout Plantars: Right: downgoing   Left: downgoing Cerebellar: No ataxia with FNF and H-S bilaterally Gait: Deferred   Lab Results: Basic Metabolic Panel: No results for input(s): NA, K, CL, CO2, GLUCOSE, BUN, CREATININE, CALCIUM, MG, PHOS in the last 168 hours.  CBC: No results for input(s): WBC, NEUTROABS, HGB, HCT, MCV, PLT in the last 168 hours.  Cardiac Enzymes: No results for input(s): CKTOTAL, CKMB, CKMBINDEX, TROPONINI in the last 168 hours.  Lipid Panel: No results for input(s): CHOL, TRIG, HDL, CHOLHDL, VLDL, LDLCALC in the last 168 hours.  Imaging: No results found.   Assessment: 71 year old male with acute onset of right-handed writing difficulty, slurred speech and mild right facial weakness 1. Exam reveals subtle right facial weakness and mild dysarthria. NIHSS 2.  2. CT head negative for acute abnormality.  3. TNK not indicated from a risk/benefit standpoint. Symptoms too mild to treat.  4. CTA of head and neck preliminary read: Mild atherosclerosis. No LVO.  5. Overall presentation is most consistent with an acute lacunar infarction involving the white matter tracts of the left cerebral hemisphere.   Recommendations: 1. HgbA1c, fasting lipid panel 2. MRI of the brain without contrast 3. PT consult, OT consult, Speech consult 4. Echocardiogram 5. Continue Zocor 6. Has failed ASA monotherapy. Continue ASA and add Plavix 75 mg po qd.  7. Glycemic control 8. Telemetry monitoring 9. Frequent neuro checks 10 NPO until passes stroke swallow  screen 11. Glycemic control 12. Modified permissive HTN protocol given advanced age. Treat SBP if > 180.  Addendum: - MRI brain reveals a small acute infarct of the left corona radiata extending toward the lentiform nucleus. Mild chronic microvascular ischemic changes are also noted. - Final CTA report: No emergent large vessel occlusion. 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 Mild calcified plaque at the carotid bifurcations without hemodynamically significant stenosis. Mild stenosis at the origin of the right vertebral artery   Electronically signed: Dr. Kerney Elbe 07/20/2021, 9:18 AM

## 2021-07-20 NOTE — Evaluation (Signed)
Physical Therapy Evaluation Patient Details Name: Vernon Payne. MRN: [id number] DOB: [birth year] Today's Date: 07/20/2021  History of Present Illness  71 year old male admitted with Small acute infarct of the left corona radiata extending toward the  lentiform nucleus after noticing decreased grip of his pen while writing and had on occasion slurred speech. PMH  significant for hypertension, diabetes, hyperlipidemia, sleep apnea .  Clinical Impression  Pt did well with PT exam and was able to do some higher level balance challenges with only light UE support.  He had some lethargy (likely due to Ativan) but was engaged and did relatively well with all mobility, balance and ambulation tasks.  He is not at his baseline and displays some unsteadiness, though he was able to ambulate ~200 ft w/o AD; PT is recommending SPC use initially especially if he is going to be home alone.  Wife works out of the home but can be flexible, states she will be able to make sure he can get to/from outpt PT moving forward.     Recommendations for follow up therapy are one component of a multi-disciplinary discharge planning process, led by the attending physician.  Recommendations may be updated based on patient status, additional functional criteria and insurance authorization.  Follow Up Recommendations Outpatient PT    Assistance Recommended at Discharge Intermittent Supervision/Assistance  Patient can return home with the following  Assist for transportation    Equipment Recommendations None recommended by PT  Recommendations for Other Services       Functional Status Assessment Patient has had a recent decline in their functional status and demonstrates the ability to make significant improvements in function in a reasonable and predictable amount of time.     Precautions / Restrictions Precautions Precautions: Fall Restrictions Weight Bearing Restrictions: No      Mobility  Bed Mobility Overal  bed mobility: Modified Independent Bed Mobility: Supine to Sit     Supine to sit: Modified independent (Device/Increase time)     General bed mobility comments: Pt with some initial difficulty finding midline to maintain balance at EOB, self corrects with gentle cuing    Transfers Overall transfer level: Needs assistance Equipment used: None, 1 person hand held assist Transfers: Sit to/from Stand Sit to Stand: Supervision           General transfer comment: Pt was able to rise to standing w/o direct physical assist, some low grade unsteadiness while initially getting standing balance but able to self correct    Ambulation/Gait Ambulation/Gait assistance: Supervision Gait Distance (Feet): 200 Feet Assistive device: None         General Gait Details: Pt with b/l genu varus that is baseline.  He was able to speed changes, head turns, stops and stop/turns all w/o AD and w/o need for direct though he did have occasional stagger stepping with challenges that he managed to self arrest.  Pt's vitals appropriate and stable but he is far from his baseline - recommending SPC, especially if he is to be doing much standing activity alone  Stairs            Wheelchair Mobility    Modified Rankin (Stroke Patients Only)       Balance Overall balance assessment: Needs assistance Sitting-balance support: Feet supported Sitting balance-Leahy Scale: Good     Standing balance support: During functional activity, No upper extremity supported Standing balance-Leahy Scale: Fair Standing balance comment: able to maintain standing with eyes closed, shld width BOS with  perturbations, had more trouble and needed UE assist with NBOS, heel raises and marching (especially with R SLS)                             Pertinent Vitals/Pain Pain Assessment Pain Location: chronic knee and back pain    Home Living Family/patient expects to be discharged to:: Private  residence Living Arrangements: Spouse/significant other Available Help at Discharge: Family;Available PRN/intermittently Type of Home: House Home Access: Ramped entrance   Entrance Stairs-Number of Steps: alternate- 7 stairs with no rails   Home Layout: One level Home Equipment: Advice worker (2 wheels);Cane - single point      Prior Function Prior Level of Function : Independent/Modified Independent             Mobility Comments: indep with no AD ADLs Comments: indep all ADL/IADL, works, is a Energy manager   Dominant Hand: Right    Extremity/Trunk Assessment   Upper Extremity Assessment Upper Extremity Assessment: Defer to OT evaluation RUE Deficits / Details: shoulder 4/5, elbow flexion/extension: 3+/5,wrist 3+/5 RUE Sensation: WNL RUE Coordination: decreased fine motor;decreased gross motor    Lower Extremity Assessment Lower Extremity Assessment: Overall WFL for tasks assessed (R knee functional near 5/5, but weaker than L)    Cervical / Trunk Assessment Cervical / Trunk Assessment: Normal  Communication   Communication: No difficulties  Cognition Arousal/Alertness: Awake/alert Behavior During Therapy: WFL for tasks assessed/performed Overall Cognitive Status: Impaired/Different from baseline Area of Impairment: Attention, Following commands, Safety/judgement, Problem solving                               General Comments: had taken ativan for MRI, wife reports he is more slurred and sleepy seeming now than a few hours prior        General Comments General comments (skin integrity, edema, etc.): vss throughout    Exercises     Assessment/Plan    PT Assessment Patient needs continued PT services  PT Problem List Decreased activity tolerance;Decreased balance;Decreased strength;Decreased mobility;Decreased safety awareness;Decreased knowledge of use of DME       PT Treatment Interventions DME  instruction;Gait training;Stair training;Therapeutic activities;Functional mobility training;Therapeutic exercise;Balance training;Cognitive remediation;Neuromuscular re-education    PT Goals (Current goals can be found in the Care Plan section)  Acute Rehab PT Goals Patient Stated Goal: go home PT Goal Formulation: With patient/family Time For Goal Achievement: 08/02/21 Potential to Achieve Goals: Good    Frequency 7X/week     Co-evaluation               AM-PAC PT "6 Clicks" Mobility  Outcome Measure Help needed turning from your back to your side while in a flat bed without using bedrails?: None Help needed moving from lying on your back to sitting on the side of a flat bed without using bedrails?: None Help needed moving to and from a bed to a chair (including a wheelchair)?: None Help needed standing up from a chair using your arms (e.g., wheelchair or bedside chair)?: None Help needed to walk in hospital room?: A Little Help needed climbing 3-5 steps with a railing? : A Little 6 Click Score: 22    End of Session Equipment Utilized During Treatment: Gait belt Activity Tolerance: Patient tolerated treatment well Patient left: with bed alarm set;with call bell/phone within reach Nurse Communication: Mobility status PT  Visit Diagnosis: Muscle weakness (generalized) (M62.81);Unsteadiness on feet (R26.81);Other symptoms and signs involving the nervous system (R29.898);Difficulty in walking, not elsewhere classified (R26.2);Hemiplegia and hemiparesis Hemiplegia - Right/Left: Right Hemiplegia - dominant/non-dominant: Dominant Hemiplegia - caused by: Cerebral infarction    Time: 6431-4276 PT Time Calculation (min) (ACUTE ONLY): 34 min   Charges:   PT Evaluation $PT Eval Low Complexity: 1 Low PT Treatments $Gait Training: 8-22 mins        Kreg Shropshire, DPT 07/20/2021, 4:43 PM

## 2021-07-20 NOTE — ED Notes (Signed)
Neuro cart at bedside and button pressed. Minette Brine RN and Advertising copywriter at bedside.

## 2021-07-20 NOTE — ED Notes (Signed)
Pt taken to CT 1 at this time.  

## 2021-07-20 NOTE — ED Notes (Signed)
Pt to MRI at this time.

## 2021-07-20 NOTE — Evaluation (Signed)
Occupational Therapy Evaluation Patient Details Name: Vernon Payne. 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 PMH  significant for hypertension, diabetes, hyperlipidemia, sleep apnea .   Clinical Impression   Chart reviewed, RN cleared pt for participation in OT evaluation. PTA pt was indep in all ADL/IADL works as a Press photographer. Pt requires SET UP for seated ADL tasks, supervision for grooming tasks at sink level, supervision-CGA for functional amb in hallway with one posterior LOB requiring MOD A to re-correct. Pt presents with deficits in R hand function, provided education re: HEP for improved function. R eye presents with ptosis and disalignment however able to track within [facility]. OT will continue to assess. Pt will benefit from ongoing OT to address functional deficits and to facilitate return to PLOF.  Pt is left as received, NAD, all needs met. OT will continue to follow acutely.      Recommendations for follow up therapy are one component of a multi-disciplinary discharge planning process, led by the attending physician.  Recommendations may be updated based on patient status, additional functional criteria and insurance authorization.   Follow Up Recommendations  Outpatient OT    Assistance Recommended at Discharge Intermittent Supervision/Assistance  Patient can return home with the following A little help with walking and/or transfers;A little help with bathing/dressing/bathroom    Functional Status Assessment  Patient has had a recent decline in their functional status and demonstrates the ability to make significant improvements in function in a reasonable and predictable amount of time.  Equipment  Recommendations  Tub/shower seat    Recommendations for Other Services       Precautions / Restrictions Precautions Precautions: Fall Restrictions Weight Bearing Restrictions: No      Mobility Bed Mobility Overal bed mobility: Needs Assistance Bed Mobility: Supine to Sit     Supine to sit: Supervision, HOB elevated          Transfers Overall transfer level: Needs assistance   Transfers: Sit to/from Stand Sit to Stand: Supervision                  Balance Overall balance assessment: Needs assistance Sitting-balance support: Feet supported Sitting balance-Leahy Scale: Good     Standing balance support: During functional activity, No upper extremity supported Standing balance-Leahy Scale: Fair                             ADL either performed or assessed with clinical judgement   ADL Overall ADL's : Needs assistance/impaired Eating/Feeding: Set up   Grooming: Wash/dry hands;Standing;Supervision/safety               Lower Body Dressing: Set up Lower Body Dressing Details (indicate cue type and reason): socks at edge of bed Toilet Transfer: Supervision/safety;Min guard;Ambulation Toilet Transfer Details (indicate cue type and reason): simulated         Functional mobility during ADLs: Supervision/safety;Min guard (approx 75', one posterior LOB requiring MOD A for recorrection)       Vision Patient Visual Report: Blurring of vision Vision Assessment?: Yes;Vision impaired- to be further tested in functional context Ocular Range of Motion: Within Functional Limits Tracking/Visual Pursuits: Able to track stimulus in all quads without  difficulty Visual Fields: No apparent deficits Additional Comments: R eye ptosis, R eye alighnment appears impaired however tracking is WNL. OT will continue to assess     Perception Perception Perception: Within Functional Limits   Praxis      Pertinent Vitals/Pain Pain Assessment Pain Assessment:  0-10 Pain Score: 4  Pain Location: knee, shoulder, back feels "swollen" Pain Descriptors / Indicators: Aching Pain Intervention(s): Limited activity within patient's tolerance, Monitored during session, Repositioned     Hand Dominance Right   Extremity/Trunk Assessment Upper Extremity Assessment Upper Extremity Assessment: RUE deficits/detail RUE Deficits / Details: shoulder 4/5, elbow flexion/extension: 3+/5,wrist 3+/5 RUE Sensation: WNL RUE Coordination: decreased fine motor;decreased gross motor   Lower Extremity Assessment Lower Extremity Assessment: Defer to PT evaluation   Cervical / Trunk Assessment Cervical / Trunk Assessment: Normal   Communication Communication Communication: HOH   Cognition Arousal/Alertness: Awake/alert Behavior During Therapy: WFL for tasks assessed/performed Overall Cognitive Status: Impaired/Different from baseline Area of Impairment: Attention, Following commands, Safety/judgement, Problem solving                   Current Attention Level: Selective   Following Commands: Follows multi-step commands with increased time Safety/Judgement: Decreased awareness of safety   Problem Solving: Requires verbal cues, Requires tactile cues General Comments: pt had taken ativan for MRI, OT will continue to assess     General Comments  vss throughout    Exercises Other Exercises Other Exercises: edu re: role of OT, role of rehab, discharge recommendations, RUE HEP, home safety   Shoulder Instructions      Home Living Family/patient expects to be discharged to:: Private residence Living Arrangements: Spouse/significant other Available Help at Discharge: Family Type of Home: House Home Access: Ramped entrance Entrance Stairs-Number of Steps: alternate- 7 stairs with no rails   Home Layout: One level     Bathroom Shower/Tub: Occupational psychologist: Standard     Home Equipment: Advice worker (2 wheels);Cane -  single point          Prior Functioning/Environment Prior Level of Function : Independent/Modified Independent             Mobility Comments: indep with no AD ADLs Comments: indep all ADL/IADL, works, is a Corporate treasurer Problem List: Decreased strength;Decreased range of motion;Decreased activity tolerance;Impaired balance (sitting and/or standing);Impaired UE functional use;Decreased coordination;Decreased knowledge of use of DME or AE      OT Treatment/Interventions: Self-care/ADL training;Therapeutic exercise;Visual/perceptual remediation/compensation;Patient/family education;Neuromuscular education;Balance training;Energy conservation;Therapeutic activities;DME and/or AE instruction    OT Goals(Current goals can be found in the care plan section) Acute Rehab OT Goals Patient Stated Goal: go home OT Goal Formulation: With patient/family Time For Goal Achievement: 07/28/21 Potential to Achieve Goals: Good ADL Goals Pt Will Perform Grooming: Independently;standing Pt Will Perform Upper Body Dressing: Independently Pt Will Perform Lower Body Dressing: Independently;sit to/from stand Pt Will Transfer to Toilet: Independently;ambulating Pt Will Perform Toileting - Clothing Manipulation and hygiene: with modified independence;sit to/from stand Pt/caregiver will Perform Home Exercise Program: Right Upper extremity;Increased strength;With written HEP provided  OT Frequency: Min 4X/week    Co-evaluation              AM-PAC OT "6 Clicks" Daily Activity     Outcome Measure Help from another person eating meals?: A Little Help from another person taking care of personal grooming?: A Little Help from another person toileting, which includes using toliet, bedpan, or urinal?:  A Little Help from another person bathing (including washing, rinsing, drying)?: A Little Help from another person to put on and taking off regular upper body clothing?: None Help from  another person to put on and taking off regular lower body clothing?: None 6 Click Score: 20   End of Session Equipment Utilized During Treatment: Gait belt Nurse Communication: Mobility status  Activity Tolerance: Patient tolerated treatment well Patient left: in bed;with call bell/phone within reach;with family/visitor present  OT Visit Diagnosis: Other symptoms and signs involving the nervous system (R29.898);Unsteadiness on feet (R26.81)                Time: 3552-1747 OT Time Calculation (min): 15 min Charges:  OT General Charges $OT Visit: 1 Visit OT Evaluation $OT Eval Moderate Complexity: 1 Mod  Shanon Payor, OTD OTR/L  07/20/21, 4:07 PM

## 2021-07-20 NOTE — ED Notes (Signed)
Pt eating lunch at this time

## 2021-07-20 NOTE — Progress Notes (Signed)
CODE STROKE- PHARMACY COMMUNICATION   Time CODE STROKE called/page received:0915  Time response to CODE STROKE was made 0923 in person   Time Stroke Kit retrieved from [place] (only if needed):TNK not indicated from a risk/benefit standpoint per neurology  Name of Provider/Nurse contacted:Olivia  Past Medical History:  Diagnosis Date   Actinic keratosis    Arthritis    Diabetes mellitus without complication (HCC)    GERD (gastroesophageal reflux disease)    Hard of hearing    left hearing aid   HLD (hyperlipidemia)    HTN (hypertension)    Hypotension, postural    IBS (irritable bowel syndrome)    Sleep apnea    Squamous cell carcinoma of skin 01/20/2010   Right chest. Well differentiated. Excised: 01/27/2010   Squamous cell carcinoma of skin 07/15/2019   R preauricular ant to lobe   Wears dentures    partial upper, implants on bottom   Prior to Admission medications   Medication Sig Start Date End Date Taking? Authorizing Provider  amLODipine (NORVASC) 5 MG tablet Take 5 mg by mouth at bedtime.  12/10/17   [provider]  aspirin EC 81 MG tablet Take 81 mg by mouth daily.    [provider]  cyclobenzaprine (FLEXERIL) 5 MG tablet Take 1 tablet (5 mg total) by mouth 3 (three) times daily as needed for muscle spasms. 03/18/19   Sable Feil, PA-C  diphenhydramine-acetaminophen (TYLENOL PM) 25-500 MG TABS tablet Take 2 tablets by mouth at bedtime.     [provider]  dorzolamide-timolol (COSOPT) 22.3-6.8 MG/ML ophthalmic solution Place 1 drop into both eyes daily. 01/31/19   [provider]  glimepiride (AMARYL) 2 MG tablet Take 4 mg by mouth daily.  04/13/17 03/26/20  [provider]  latanoprost (XALATAN) 0.005 % ophthalmic solution Place 1 drop into both eyes at bedtime.    [provider]  Lidocaine 4 % PTCH Place 1 patch onto the skin daily. OTC    [provider]  lisinopril (PRINIVIL,ZESTRIL) 10 MG tablet Take  10 mg by mouth daily.  12/10/17   [provider]  metFORMIN (GLUCOPHAGE) 500 MG tablet Take 1,000 mg by mouth 2 (two) times daily with a meal.     [provider]  Multiple Vitamin (MULTIVITAMIN WITH MINERALS) TABS tablet Take 1 tablet by mouth daily.    [provider]  simvastatin (ZOCOR) 20 MG tablet Take 20 mg by mouth at bedtime. 01/21/19   [provider]  traMADol (ULTRAM) 50 MG tablet Take 1 tablet (50 mg total) by mouth every 6 (six) hours as needed for up to 30 doses. 03/28/19   Hessie Knows, MD  TRULICITY 3.01 TH/4.3OO SOPN Inject 0.75 mg into the skin every Sunday. 12/31/18   [provider]    Dallie Piles ,PharmD Clinical Pharmacist  07/20/2021  9:15 AM

## 2021-07-20 NOTE — ED Notes (Signed)
Stroke Response Nurse Documentation Code Documentation  Vernon Payne. is a 71 y.o. male arriving to [facility] via [facility] on 07/20/2021. Code stroke was activated by ED.   Patient from work where he was LKW at 0800 and now complaining of slurred speech, right sided weakness, and facial droop. 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 He called his wife and told her that his right hand and side of his tongue felt heavy. Pt drove to the ED and was activated in triage.    Stroke team at the bedside on patient activation. Labs drawn and patient cleared for CT. Patient to CT. NIHSS 2, see documentation for details and code stroke times. Patient with right facial droop and dysarthria  on exam. The following imaging was completed:  CT Head and CTA. Patient is not a candidate for IV Thrombolytic due to being too mild to treat. Patient is not a candidate for IR due to no LVO noted on imaging per provider.   Care Plan: q30 NIHSS/vs until 4235, if patient worsens he is eligible for TNK. Then, q2 NIHSS/VS x 12 hours.    Bedside handoff with ED RN Harlon Ditty  Stroke Response RN

## 2021-07-20 NOTE — Consult Note (Signed)
0918-Code Stroke called 0923-Dr Lindzen at bedside

## 2021-07-20 NOTE — ED Provider Notes (Addendum)
[facility] Provider Note    Event Date/Time   First MD Initiated Contact with Patient 07/20/21 [phone]     (approximate)   History   Chest Pain   HPI  Vernon Payne. is a 71 y.o. male with history of diabetes, hypertension, hyperlipidemia, GERD, sleep apnea, and IBS who presents with acute onset of right-sided weakness and dizziness around 8 AM.  The patient awoke and felt fine until the symptoms started.  The patient states that he felt as if he was drunk and did not know what he was doing.  He is having difficulty speaking.  He states that the weakness is getting better.  He denies any prior history of the symptoms.  He has no headache.    Physical Exam   Triage Vital Signs: ED Triage Vitals  Enc Vitals Group     BP 07/20/21 0934 (!) 176/104     Pulse Rate 07/20/21 0934 80     Resp 07/20/21 0934 18     Temp 07/20/21 0934 98 F (36.7 C)     Temp src --      SpO2 07/20/21 0934 100 %     Weight --      Height --      Head Circumference --      Peak Flow --      Pain Score 07/20/21 0916 0     Pain Loc --      Pain Edu? --      Excl. in [place]? --     Most recent vital signs: Vitals:   07/20/21 0934 07/20/21 1010  BP: (!) 176/104   Pulse: 80 80  Resp: 18 16  Temp: 98 F (36.7 C)   SpO2: 100% 97%     General: Alert and oriented, comfortable appearing. CV:  Good peripheral perfusion.  Resp:  Normal effort.  Abd:  No distention.  Other:  Very mild dysarthria.  EOMI.  PERRLA.  5/5 motor strength and intact sensation to bilateral upper and lower extremities.  No pronator drift.  No ataxia.   ED Results / Procedures / Treatments   Labs (all labs ordered are listed, but only abnormal results are displayed) Labs Reviewed  COMPREHENSIVE METABOLIC PANEL - Abnormal; Notable for the following components:      Result Value   Glucose, Bld 164 (*)    All other components within normal limits  CBG MONITORING, ED - Abnormal; Notable for the  following components:   Glucose-Capillary 169 (*)    All other components within normal limits  PROTIME-INR  APTT  CBC  DIFFERENTIAL     EKG  ED ECG REPORT I, Arta Silence, the attending physician, personally viewed and interpreted this ECG.  Date: 07/20/2021 EKG Time: 0923 Rate: 79 Rhythm: normal sinus rhythm QRS Axis: normal Intervals: normal ST/T Wave abnormalities: Nonspecific ST abnormalities (interpretation limited by poor EKG baseline) Narrative Interpretation: Nonspecific abnormalities with no evidence of acute ischemia; no significant change when compared to EKG of 03/27/2019    RADIOLOGY  CT head: I independently viewed and interpreted the images; there is no intracranial hemorrhage.  Radiology report confirms no acute abnormalities  PROCEDURES:  Critical Care performed: Yes, see critical care procedure note(s)  .Critical Care Performed by: Arta Silence, MD Authorized by: Arta Silence, MD   Critical care provider statement:    Critical care time (minutes):  30   Critical care was necessary to treat or prevent imminent or life-threatening deterioration of  the following conditions:  CNS failure or compromise   Critical care was time spent personally by me on the following activities:  Development of treatment plan with patient or surrogate, discussions with consultants, evaluation of patient's response to treatment, examination of patient, ordering and review of laboratory studies, ordering and review of radiographic studies, ordering and performing treatments and interventions, pulse oximetry, re-evaluation of patient's condition and review of old charts   Care discussed with: admitting provider     MEDICATIONS ORDERED IN ED: Medications  clopidogrel (PLAVIX) tablet 75 mg (75 mg Oral Given 07/20/21 1019)  insulin aspart (novoLOG) injection 0-9 Units (has no administration in time range)  insulin aspart (novoLOG) injection 0-5 Units (has no  administration in time range)  sodium chloride flush (NS) 0.9 % injection 3 mL (3 mLs Intravenous Given 07/20/21 0921)  iohexol (OMNIPAQUE) 350 MG/ML injection 75 mL (75 mLs Intravenous Contrast Given 07/20/21 0940)     IMPRESSION / MDM / [place] / ED COURSE  I reviewed the triage vital signs and the nursing notes.  71 year old male with PMH as noted above presents with acute onset of right-sided weakness, dysarthria, and the sensation of being drunk.  The symptoms are improving already.  He denies prior stroke history.  I reviewed the past medical records.  The patient has no recent ED visits or hospitalizations.  He follows with Dr. Sabra Heck.  Per the last internal medicine note from 06/14/2021 he is followed for diabetes with an elevated A1c and has OSA on CPAP.  The patient is hypertensive.  Other vital signs are normal.  Exam is significant for a positive right-sided "cheek puff" test and mild dysarthria.  Neuro exam is otherwise normal.  Differential diagnosis includes, but is not limited to, CVA, TIA, intracranial hemorrhage, complex migraine.  Code stroke was activated.  CT head shows no acute findings.  I consulted Dr. Cheral Marker from neurology who evaluated the patient at the bedside.  The patient's NIHSS is 2.  Based on my discussion with Dr. Cheral Marker, he is not a candidate for TNK at this time.  We will obtain CT angio for further evaluation and plan for admission.  Patient's presentation is most consistent with acute presentation with potential threat to life or bodily function.  The patient is on the cardiac monitor to evaluate for evidence of arrhythmia and/or significant heart rate changes.  ----------------------------------------- 11:17 AM on 07/20/2021 -----------------------------------------  CT shows no LVO.  Lab work-up is overall unremarkable with normal electrolytes and creatinine, no leukocytosis or significant anemia, and normal coags.  Dr. Cheral Marker  recommends inpatient admission.  I consulted Dr. Posey Pronto from the hospitalist service; based on her discussion she agrees to admit the patient.  FINAL CLINICAL IMPRESSION(S) / ED DIAGNOSES   Final diagnoses:  Acute CVA (cerebrovascular accident) ([place])     Rx / DC Orders   ED Discharge Orders     None        Note:  This document was prepared using Dragon voice recognition software and may include unintentional dictation errors.    Arta Silence, MD 07/20/21 1117    Arta Silence, MD 07/20/21 1118

## 2021-07-20 NOTE — ED Notes (Signed)
Neurologist at bedside assessing pt at this time.

## 2021-07-20 NOTE — Progress Notes (Signed)
Admission profile updated. ?

## 2021-07-20 NOTE — Progress Notes (Signed)
  Chaplain On-Call responded to Code Stroke notification to ED room 7 at 0913 hours.  Patient was being assessed by Physicians and Nursing Staff. Decision was made to take the patient for CT scan.  Patient's wife Vernon Payne arrived. Chaplain provided spiritual and emotional support.  Chaplain Pollyann Samples M.Div., [facility]

## 2021-07-20 NOTE — ED Triage Notes (Signed)
Pt reports that he woke up fine this am, began having rt sided weakness and states that he had sudden dizziness. Pt also reports that he was writing and suddenly felt like he didn't know what he was doing and felt "drunk". Pt states that he is having difficulty speaking also. LKW 0800 this am.

## 2021-07-21 ENCOUNTER — Encounter: Payer: Self-pay | Admitting: Internal Medicine

## 2021-07-21 ENCOUNTER — Observation Stay
Admit: 2021-07-21 | Discharge: 2021-07-21 | Disposition: A | Discharge status: Home / Self Care | Payer: Medicare PPO | Attending: Internal Medicine | Admitting: Internal Medicine

## 2021-07-21 DIAGNOSIS — I639 Cerebral infarction, unspecified: Secondary | ICD-10-CM | POA: Diagnosis not present

## 2021-07-21 LAB — ECHOCARDIOGRAM COMPLETE
Area-P 1/2: 3.08 cm2
Height: 75.512 in
S' Lateral: 3.1 cm
Weight: 3435.65 oz

## 2021-07-21 LAB — HEMOGLOBIN A1C
Hgb A1c MFr Bld: 7.3 % — ABNORMAL HIGH (ref 4.8–5.6)
Mean Plasma Glucose: 162.81 mg/dL

## 2021-07-21 LAB — GLUCOSE, CAPILLARY: Glucose-Capillary: 232 mg/dL — ABNORMAL HIGH (ref 70–99)

## 2021-07-21 MED ORDER — CLOPIDOGREL BISULFATE 75 MG PO TABS: 75.0000 mg | ORAL_TABLET | Freq: Every day | ORAL | 3 refills | Status: DC

## 2021-07-21 MED ORDER — SIMVASTATIN 40 MG PO TABS: 40.0000 mg | ORAL_TABLET | Freq: Every day | ORAL | 1 refills | Status: DC

## 2021-07-21 MED ORDER — LISINOPRIL 10 MG PO TABS
10.0000 mg | ORAL_TABLET | Freq: Every day | ORAL | Status: DC
  Administered 2021-07-21: 10 mg via ORAL
  Filled 2021-07-21: qty 1

## 2021-07-21 NOTE — Progress Notes (Signed)
Physical Therapy Treatment Patient Details Name: Vernon Payne. 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 PMH  significant for hypertension, diabetes, hyperlipidemia, sleep apnea .    PT Comments    Pt was seated EOB getting dressed upon arriving. Supportive spouse at bedside. He agrees to session and is eager to DC home. MD arrived towards end of session. Pt continues to present with deficits with strength, balance, and overall requiring more assistance than he does at baseline. Author highly recommends pt go to OP PT at DC to progress to PLOF.     Recommendations for follow up therapy are one component of a multi-disciplinary discharge planning process, led by the attending physician.  Recommendations may be updated based on patient status, additional functional criteria and insurance authorization.  Follow Up Recommendations  Outpatient PT     Assistance Recommended at Discharge Intermittent Supervision/Assistance  Patient can return home with the following Assist for transportation   Equipment Recommendations  None recommended by PT       Precautions / Restrictions Precautions Precautions: Fall Restrictions Weight Bearing Restrictions: No     Mobility  Bed Mobility Overal bed mobility: Modified Independent     Transfers Overall transfer level: Modified independent     Ambulation/Gait Ambulation/Gait assistance: Min guard, Supervision Gait Distance (Feet): 200 Feet Assistive device: Straight cane Gait Pattern/deviations: Drifts right/left, Narrow base of support, Staggering left, Staggering right Gait velocity: decreased     General Gait Details: Author recommended pt use AD at all times until balance and safty  improve. He states understanding and per pt/pt's spouse, " we have all the equipment already." Pt has several occasions of unsteadiness with CGA for safety. mostly ambulated with supervision only   Stairs Stairs: Yes Stairs assistance: Supervision Stair Management: One rail Right, With cane, Step to pattern Number of Stairs: 12       Balance Overall balance assessment: Needs assistance Sitting-balance support: Feet supported Sitting balance-Leahy Scale: Good     Standing balance support: During functional activity, No upper extremity supported Standing balance-Leahy Scale: Fair Standing balance comment: pt is high fall risk due to balance deficits and pt's slight impulsivity       Cognition Arousal/Alertness: Awake/alert Behavior During Therapy: WFL for tasks assessed/performed Overall Cognitive Status: Impaired/Different from baseline      General Comments: Pt is A and O x 3 and agreeable to session. spouse answers alot of pt's questions for him even when asked not to.               Pertinent Vitals/Pain Pain Assessment Pain Assessment: No/denies pain     PT Goals (current goals can now be found in the care plan section) Acute Rehab PT Goals Patient Stated Goal: go home Progress towards PT goals: Progressing toward goals    Frequency    7X/week      PT Plan Current plan remains appropriate       AM-PAC PT "6 Clicks" Mobility   Outcome Measure  Help needed turning from your back to your side while in a flat bed without using bedrails?: None Help needed moving from lying on your back to sitting on the side of a flat bed without using bedrails?: None Help needed moving to and from  a bed to a chair (including a wheelchair)?: None Help needed standing up from a chair using your arms (e.g., wheelchair or bedside chair)?: None Help needed to walk in hospital room?: A Little Help needed climbing 3-5 steps with a railing? : A Little 6 Click Score: 22     End of Session   Activity Tolerance: Patient tolerated treatment well Patient left:  (Seated EOB with MD and spouse discussing stroke and expectation going forward) Nurse Communication: Mobility status PT Visit Diagnosis: Muscle weakness (generalized) (M62.81);Unsteadiness on feet (R26.81);Other symptoms and signs involving the nervous system (R29.898);Difficulty in walking, not elsewhere classified (R26.2);Hemiplegia and hemiparesis Hemiplegia - Right/Left: Right Hemiplegia - dominant/non-dominant: Dominant Hemiplegia - caused by: Cerebral infarction     Time: 0921-0937 PT Time Calculation (min) (ACUTE ONLY): 16 min  Charges:  $Gait Training: 8-22 mins                    Julaine Fusi PTA 07/21/21, 10:11 AM

## 2021-07-21 NOTE — Progress Notes (Signed)
  Echocardiogram 2D Echocardiogram has been performed.  Darlina Sicilian M 07/21/2021, 8:12 AM

## 2021-07-21 NOTE — Discharge Summary (Signed)
Physician Discharge Summary   Patient: Vernon Payne. MRN: [id number] DOB: 18-Jan-1951  Admit date:     07/20/2021  Discharge date: 07/21/21  Discharge Physician: Fritzi Mandes   PCP: Rusty Aus, MD   Recommendations at discharge:    F/u Dr Sabra Heck in 1-2 weeks  Discharge Diagnoses: Acute left Corona Radiata lacunar CVA  Hospital Course:   Jatavian Calica. is a 71 y.o. male with medical history significant of hypertension, diabetes, hyperlipidemia, sleep apnea comes to the emergency room after he started noticing right-sided weakness after he went to work. He started noticing decreased grip of his pen while writing and had on occasion slurred speech. He started filling heaviness on the right side called his wife and was brought to the emergency room.   Acute on said right-sided weakness with slurred speech and facial weakness Acute left Corona Radiata CVA -- CT head negative for stroke -- seen by neurology Dr Cheral Marker-- continue aspirin and added Plavix 75 mg PO daily -- continue statins -- PT OT--recommends out pt PT -- patient swallowing well -- MRI brain--positive for CVA -- A1c 7.3%   Hypertension -- will allow permissive hypertension--now resumed BP meds at d/c -- PRN hydralazine   type II diabetes without complication -- K5V 3.7% -- sliding scale for now -- resume home meds   Hyperlipidemia -- on statins      Advance Care Planning: Code full   Consults: neurology   Family Communication: wife at bedside   Disposition: Home Diet recommendation:  Discharge Diet Orders (From admission, onward)     Start     Ordered   07/21/21 0000  Diet - low sodium heart healthy        07/21/21 0955   07/21/21 0000  Diet Carb Modified        07/21/21 0955           Cardiac and Carb modified diet DISCHARGE MEDICATION: Allergies as of 07/21/2021       Reactions   Morphine And Related Nausea And Vomiting        Medication List     TAKE these medications     aspirin EC 81 MG tablet Take 81 mg by mouth daily.   clopidogrel 75 MG tablet Commonly known as: PLAVIX Take 1 tablet (75 mg total) by mouth daily.   diphenhydramine-acetaminophen 25-500 MG Tabs tablet Commonly known as: TYLENOL PM Take 2 tablets by mouth at bedtime.   dorzolamide-timolol 22.3-6.8 MG/ML ophthalmic solution Commonly known as: COSOPT Place 1 drop into both eyes daily.   glimepiride 2 MG tablet Commonly known as: AMARYL Take 4 mg by mouth daily.   latanoprost 0.005 % ophthalmic solution Commonly known as: XALATAN Place 1 drop into both eyes at bedtime.   lisinopril 10 MG tablet Commonly known as: ZESTRIL Take 10 mg by mouth daily.   metFORMIN 500 MG tablet Commonly known as: GLUCOPHAGE Take 1,000 mg by mouth 2 (two) times daily with a meal.   simvastatin 40 MG tablet Commonly known as: ZOCOR Take 1 tablet (40 mg total) by mouth at bedtime. What changed:  medication strength how much to take   Trulicity 4.82 LM/7.8ML Sopn Generic drug: Dulaglutide Inject 0.75 mg into the skin every Sunday.        Follow-up Information     Rusty Aus, MD. Schedule an appointment as soon as possible for a visit in 1 week(s).   Specialty: Internal Medicine Why: Acute CVA f/u Contact information:  [address] [place] [address] [phone]                Discharge Exam: Danley Danker Weights   07/20/21 1100  Weight: 97.4 kg     Condition at discharge: fair  The results of significant diagnostics from this hospitalization (including imaging, microbiology, ancillary and laboratory) are listed below for reference.   Imaging Studies: MR BRAIN WO CONTRAST  Result Date: 07/20/2021 CLINICAL DATA:  Right-sided weakness EXAM: MRI HEAD WITHOUT CONTRAST TECHNIQUE: Multiplanar, multiecho pulse sequences of the brain and surrounding structures were obtained without intravenous contrast. COMPARISON:  2017 FINDINGS:  Brain: Small focus reduced diffusion is present in the left corona radiata extending toward the lentiform nucleus. No evidence of intracranial hemorrhage. There is no intracranial mass or mass effect. Patchy foci of T2 hyperintensity in the supratentorial white matter are nonspecific but may reflect mild chronic microvascular ischemic changes. There is no hydrocephalus or extra-axial fluid collection. Prominence of the ventricles and sulci reflects minor parenchymal volume loss. Vascular: Major vessel flow voids at the skull base are preserved. Skull and upper cervical spine: Normal marrow signal is preserved. Sinuses/Orbits: Mild mucosal thickening is the. Bilateral lens replacements. Other: Sella is unremarkable.  Mastoid air cells are clear. IMPRESSION: Small acute infarct of the left corona radiata extending toward the lentiform nucleus. Mild chronic microvascular ischemic changes. Electronically Signed   By: Macy Mis M.D.   On: 07/20/2021 13:30   ECHOCARDIOGRAM COMPLETE  Result Date: 07/21/2021    ECHOCARDIOGRAM REPORT   Patient Name:   Drequan Ironside. Date of Exam: 07/21/2021 Medical Rec #:  [id number]         Height:       75.5 in Accession #:    [id number]        Weight:       214.7 lb Date of Birth:  1951-01-07         BSA:          2.273 m Patient Age:    75 years          BP:           161/88 mmHg Patient Gender: M                 HR:           91 bpm. Exam Location:  Inpatient Procedure: 2D Echo, Cardiac Doppler and Color Doppler Indications:     Stroke I63.9  History:         Patient has no prior history of Echocardiogram examinations.                  Risk Factors:Hypertension, Dyslipidemia and Diabetes. GERD.  Sonographer:     Darlina Sicilian RDCS Referring Phys:  Coral Diagnosing Phys: Isaias Cowman MD IMPRESSIONS  1. Left ventricular ejection fraction, by estimation, is 55 to 60%. The left ventricle has normal function. The left ventricle has no regional wall motion  abnormalities. Left ventricular diastolic parameters are consistent with Grade I diastolic dysfunction (impaired relaxation).  2. Right ventricular systolic function is normal. The right ventricular size is normal.  3. The mitral valve is normal in structure. Mild mitral valve regurgitation. No evidence of mitral stenosis.  4. The aortic valve is normal in structure. Aortic valve regurgitation is not visualized. No aortic stenosis is present.  5. The inferior vena cava is normal in size with greater than 50% respiratory variability, suggesting  right atrial pressure of 3 mmHg. FINDINGS  Left Ventricle: Left ventricular ejection fraction, by estimation, is 55 to 60%. The left ventricle has normal function. The left ventricle has no regional wall motion abnormalities. The left ventricular internal cavity size was normal in size. There is  no left ventricular hypertrophy. Left ventricular diastolic parameters are consistent with Grade I diastolic dysfunction (impaired relaxation). Right Ventricle: The right ventricular size is normal. No increase in right ventricular wall thickness. Right ventricular systolic function is normal. Left Atrium: Left atrial size was normal in size. Right Atrium: Right atrial size was normal in size. Pericardium: There is no evidence of pericardial effusion. Mitral Valve: The mitral valve is normal in structure. Mild mitral valve regurgitation. No evidence of mitral valve stenosis. Tricuspid Valve: The tricuspid valve is normal in structure. Tricuspid valve regurgitation is mild . No evidence of tricuspid stenosis. Aortic Valve: The aortic valve is normal in structure. Aortic valve regurgitation is not visualized. No aortic stenosis is present. Pulmonic Valve: The pulmonic valve was normal in structure. Pulmonic valve regurgitation is not visualized. No evidence of pulmonic stenosis. Aorta: The aortic root is normal in size and structure. Venous: The inferior vena cava is normal in size with  greater than 50% respiratory variability, suggesting right atrial pressure of 3 mmHg. IAS/Shunts: No atrial level shunt detected by color flow Doppler.  LEFT VENTRICLE PLAX 2D LVIDd:         4.50 cm   Diastology LVIDs:         3.10 cm   LV e' medial:    4.78 cm/s LV PW:         1.10 cm   LV E/e' medial:  11.1 LV IVS:        1.10 cm   LV e' lateral:   5.43 cm/s LVOT diam:     2.20 cm   LV E/e' lateral: 9.8 LV SV:         45 LV SV Index:   20 LVOT Area:     3.80 cm  RIGHT VENTRICLE RV S prime:     11.40 cm/s TAPSE (M-mode): 2.5 cm LEFT ATRIUM             Index        RIGHT ATRIUM          Index LA diam:        4.00 cm 1.76 cm/m   RA Area:     9.72 cm LA Vol (A2C):   37.9 ml 16.68 ml/m  RA Volume:   15.20 ml 6.69 ml/m LA Vol (A4C):   42.1 ml 18.52 ml/m LA Biplane Vol: 40.7 ml 17.91 ml/m  AORTIC VALVE LVOT Vmax:   67.20 cm/s LVOT Vmean:  38.200 cm/s LVOT VTI:    0.118 m  AORTA Ao Root diam: 3.70 cm Ao Asc diam:  3.65 cm MITRAL VALVE MV Area (PHT): 3.08 cm    SHUNTS MV Decel Time: 246 msec    Systemic VTI:  0.12 m MV E velocity: 53.10 cm/s  Systemic Diam: 2.20 cm MV A velocity: 68.10 cm/s MV E/A ratio:  0.78 Isaias Cowman MD Electronically signed by Isaias Cowman MD Signature Date/Time: 07/21/2021/9:22:06 AM    Final    CT HEAD CODE STROKE WO CONTRAST  Result Date: 07/20/2021 CLINICAL DATA:  Code stroke. Neuro deficit, acute, stroke suspected. Right sided weakness. Dizziness. Confusion. EXAM: CT HEAD WITHOUT CONTRAST TECHNIQUE: Contiguous axial images were obtained from the base of the skull through  the vertex without intravenous contrast. RADIATION DOSE REDUCTION: This exam was performed according to the departmental dose-optimization program which includes automated exposure control, adjustment of the mA and/or kV according to patient size and/or use of iterative reconstruction technique. COMPARISON:  MRI 03/25/2015 FINDINGS: Brain: Normal appearance without evidence of accelerated atrophy, old or  acute infarction, mass lesion, hemorrhage, hydrocephalus or extra-axial collection. Vascular: There is atherosclerotic calcification of the major vessels at the base of the brain. Skull: Negative Sinuses/Orbits: Clear/normal Other: None ASPECTS (Amber Stroke Program Early CT Score) - Ganglionic level infarction (caudate, lentiform nuclei, internal capsule, insula, M1-M3 cortex): 7 - Supraganglionic infarction (M4-M6 cortex): 3 Total score (0-10 with 10 being normal): 10 IMPRESSION: 1. Normal head CT for age. 2. ASPECTS is 10. 3. These results were called by telephone at the time of interpretation on 07/20/2021 at 9:22 am to provider Dr. Joni Fears , who verbally acknowledged these results. Electronically Signed   By: Nelson Chimes M.D.   On: 07/20/2021 09:24   CT ANGIO HEAD NECK W WO CM (CODE STROKE)  Result Date: 07/20/2021 CLINICAL DATA:  Right-sided weakness, dizziness EXAM: CT ANGIOGRAPHY HEAD AND NECK TECHNIQUE: Multidetector CT imaging of the head and neck was performed using the standard protocol during bolus administration of intravenous contrast. Multiplanar CT image reconstructions and MIPs were obtained to evaluate the vascular anatomy. Carotid stenosis measurements (when applicable) are obtained utilizing NASCET criteria, using the distal internal carotid diameter as the denominator. RADIATION DOSE REDUCTION: This exam was performed according to the departmental dose-optimization program which includes automated exposure control, adjustment of the mA and/or kV according to patient size and/or use of iterative reconstruction technique. CONTRAST:  70m OMNIPAQUE IOHEXOL 350 MG/ML SOLN COMPARISON:  Same-day noncontrast head CT FINDINGS: CTA NECK FINDINGS Aortic arch: There is mild calcified atherosclerotic plaque in the imaged aortic arch. Origins of the major branch vessels are patent. The subclavian arteries are patent to the level imaged. Right carotid system: The right common carotid artery is patent.  There is mild calcified plaque at the bifurcation and proximal internal carotid artery resulting in less than 50% stenosis. The distal right internal carotid artery is patent. The right external carotid artery is patent. There is no dissection or aneurysm. Left carotid system: The left common carotid artery is patent with scattered mixed plaque resulting in less than 50% stenosis. There is mild plaque at the bifurcation and proximal left internal carotid artery resulting in less than 50% stenosis. The distal left internal carotid artery is patent. The left external carotid artery is patent. There is no dissection or aneurysm. Vertebral arteries: There is mild stenosis at the origin of the right vertebral artery. The right vertebral artery is otherwise widely patent. The left vertebral artery is patent. There is no dissection or aneurysm. Skeleton: There is degenerative change of the cervical spine most advanced at C6-C7. There is no acute osseous abnormality or suspicious osseous lesion. Other neck: The soft tissues of the neck are unremarkable. Upper chest: The imaged lung apices are clear. Review of the MIP images confirms the above findings CTA HEAD FINDINGS Anterior circulation: There is mild calcified plaque in the intracranial ICAs without hemodynamically significant stenosis The bilateral MCAs are patent. The bilateral ACAs are patent. The right A1 segment is diminutive, likely a developmental variant. The anterior communicating artery is normal. There is no aneurysm or AVM. Posterior circulation: The bilateral V4 segments are patent. PICA is identified bilaterally. The basilar artery is patent. There is multifocal moderate to  severe stenosis of the right P1 and P2 segments (7-116). The distal PCA is patent. There is focal moderate stenosis of the left P2 segment (7-117). The distal left PCA is patent. The posterior communicating arteries are not identified There is no aneurysm or AVM. Venous sinuses: Patent.  Anatomic variants: None. Review of the MIP images confirms the above findings IMPRESSION: 1. No emergent large vessel occlusion. 2. 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 Otherwise, patent intracranial vasculature. 3. Mild calcified plaque at the carotid bifurcations without hemodynamically significant stenosis. 4. Mild stenosis at the origin of the right vertebral artery. These results were called by telephone at the time of interpretation on 07/20/2021 at 10:02 am to provider Dr Cheral Marker, who verbally acknowledged these results. Electronically Signed   By: Valetta Mole M.D.   On: 07/20/2021 10:11    Microbiology: Results for orders placed or performed during the hospital encounter of 03/26/19  SARS CORONAVIRUS 2 (TAT 6-24 HRS) Nasopharyngeal Nasopharyngeal Swab     Status: None   Collection Time: 03/26/19 10:02 AM   Specimen: Nasopharyngeal Swab  Result Value Ref Range Status   SARS Coronavirus 2 NEGATIVE NEGATIVE Final    Comment: (NOTE) SARS-CoV-2 target nucleic acids are NOT DETECTED. The SARS-CoV-2 RNA is generally detectable in upper and lower respiratory specimens during the acute phase of infection. Negative results do not preclude SARS-CoV-2 infection, do not rule out co-infections with other pathogens, and should not be used as the sole basis for treatment or other patient management decisions. Negative results must be combined with clinical observations, patient history, and epidemiological information. The expected result is Negative. Fact Sheet for Patients: SugarRoll.be Fact Sheet for Healthcare Providers: https://www.woods-mathews.com/ This test is not yet approved or cleared by the [place] FDA and  has been authorized for detection and/or diagnosis of SARS-CoV-2 by FDA under an Emergency Use Authorization (EUA). This EUA will remain  in effect  (meaning this test can be used) for the duration of the COVID-19 declaration under Section 56 4(b)(1) of the Act, 21 U.S.C. section 360bbb-3(b)(1), unless the authorization is terminated or revoked sooner. Performed at [facility], [place] [address], [place], [place] [address]     Labs: CBC: Recent Labs  Lab 07/20/21 0911  WBC 7.6  NEUTROABS 4.9  HGB 13.7  HCT 42.1  MCV 87.9  PLT 578   Basic Metabolic Panel: Recent Labs  Lab 07/20/21 0911  NA 136  K 4.2  CL 101  CO2 26  GLUCOSE 164*  BUN 11  CREATININE 1.02  CALCIUM 10.1   Liver Function Tests: Recent Labs  Lab 07/20/21 0911  AST 23  ALT 18  ALKPHOS 60  BILITOT 0.8  PROT 7.5  ALBUMIN 4.6   CBG: Recent Labs  Lab 07/20/21 0909 07/20/21 1209 07/20/21 1734 07/20/21 2225 07/21/21 0847  GLUCAP 169* 123* 131* 155* 232*    Discharge time spent: greater than 30 minutes.  Signed: Fritzi Mandes, MD [facility] 07/21/2021

## 2021-07-21 NOTE — TOC Initial Note (Signed)
Transition of Care (TOC) - Initial/Assessment Note    Patient Details  Name: Vernon Payne. MRN: [id number] Date of Birth: 01/22/1951  [facility]) CM/SW Contact:    Pete Pelt, RN Phone Number: 07/21/2021, 10:18 AM  Clinical Narrative:    Patient lives at home with spouse. Spouse can assist with needs including medications and transportation and she will transport home.  Outpatient therapy ordered, patient and spouse ok with having therapy here at [facility].  Patient and spouse state they have a shower chair and a cane for patient at home.  They are comfortable with discharge today.               Expected Discharge Plan: OP Rehab Barriers to Discharge: Barriers Resolved   Patient Goals and CMS Choice        Expected Discharge Plan and Services Expected Discharge Plan: OP Rehab   Discharge Planning Services: CM Consult Post Acute Care Choice:  (patient has DME at home) Living arrangements for the past 2 months: Single Family Home Expected Discharge Date: 07/21/21                                    Prior Living Arrangements/Services Living arrangements for the past 2 months: Single Family Home Lives with:: Self, Spouse Patient language and need for interpreter reviewed:: Yes (No interpreter required) Do you feel safe going back to the place where you live?: Yes      Need for Family Participation in Patient Care: Yes (Comment) Care giver support system in place?: Yes (comment)   Criminal Activity/Legal Involvement Pertinent to Current Situation/Hospitalization: No - Comment as needed  Activities of Daily Living Home Assistive Devices/Equipment: Eyeglasses ADL Screening (condition at time of admission) Patient's cognitive ability adequate to safely complete daily activities?: Yes Is the patient deaf or have difficulty hearing?: No Does the patient have difficulty seeing, even when wearing glasses/contacts?: No Does the patient have difficulty  concentrating, remembering, or making decisions?: No Patient able to express need for assistance with ADLs?: Yes Does the patient have difficulty dressing or bathing?: No Independently performs ADLs?: Yes (appropriate for developmental age) Does the patient have difficulty walking or climbing stairs?: No Weakness of Legs: None Weakness of Arms/Hands: None  Permission Sought/Granted Permission sought to share information with : Case Manager Permission granted to share information with : Yes, Verbal Permission Granted              Emotional Assessment Appearance:: Appears stated age Attitude/Demeanor/Rapport: Gracious, Engaged Affect (typically observed): Pleasant, Appropriate Orientation: : Oriented to Self, Oriented to Place, Oriented to  Time, Oriented to Situation Alcohol / Substance Use: Not Applicable Psych Involvement: No (comment)  Admission diagnosis:  Right sided weakness [R53.1] Acute CVA (cerebrovascular accident) [facility]) [I63.9] Patient Active Problem List   Diagnosis Date Noted   Acute CVA (cerebrovascular accident) ([place])    Right sided weakness 07/20/2021   Primary osteoarthritis of right knee 09/04/2017   Primary osteoarthritis of left knee 09/04/2017   Status post left partial knee replacement 09/04/2017   Yellow jacket sting 10/31/2016   HTN (hypertension) 10/31/2016   Diabetes ([place]) 10/31/2016   HLD (hyperlipidemia) 10/31/2016   PCP:  Rusty Aus, MD Pharmacy:   [facility] [address] [place] [address] [place] [address] Phone: [phone] Fax: [phone]     Social Determinants of Health (SDOH) Interventions  Readmission Risk Interventions     View : No data to display.

## 2021-07-21 NOTE — Plan of Care (Signed)

## 2021-07-21 NOTE — Therapy (Signed)
Occupational Therapy * Physical Therapy * Speech Therapy          DATE ___01 June 2023 PATIENT NAME___[name]. PATIENT MRN____MRN:  [id number]________________  DIAGNOSIS/DIAGNOSIS CODE _____Acute on said right-sided weakness with slurred speech and facial weakness_________________ DATE OF DISCHARGE: ___01 June 2023  PRIMARY CARE PHYSICIAN _____Dr. Elta Guadeloupe Miller_____________________ PCP PHONE/FAX_______[phone]____________________     Dear Provider (Name: __________________   Fax: ___________________________):   I certify that I have examined this patient and that occupational/physical/speech therapy is necessary on an outpatient basis.    The patient has expressed interest in completing their recommended course of therapy at your location.  Once a formal order from the patient's primary care physician has been obtained, please contact him/her to schedule an appointment for evaluation at your earliest convenience.   [  x]  Physical Therapy Evaluate and Treat          [ x ]  Occupational Therapy Evaluate and Treat                                    [  ]  Speech Therapy Evaluate and Treat       The patient's primary care physician (listed above) must furnish and be responsible for a formal order such that the recommended services may be furnished while under the primary physician's care, and that the plan of care will be established and reviewed every 30 days (or more often if condition necessitates).

## 2021-07-21 NOTE — Discharge Instructions (Signed)
No Driving till seen by PCP Dr Emily Filbert

## 2021-08-04 ENCOUNTER — Ambulatory Visit: Payer: Medicare PPO

## 2021-08-04 ENCOUNTER — Ambulatory Visit: Payer: Medicare PPO | Attending: Internal Medicine

## 2021-08-04 VITALS — BP 90/69 | Ht 75.0 in | Wt 207.0 lb

## 2021-08-04 DIAGNOSIS — R262 Difficulty in walking, not elsewhere classified: Secondary | ICD-10-CM | POA: Insufficient documentation

## 2021-08-04 DIAGNOSIS — R41841 Cognitive communication deficit: Secondary | ICD-10-CM | POA: Insufficient documentation

## 2021-08-04 DIAGNOSIS — R2689 Other abnormalities of gait and mobility: Secondary | ICD-10-CM | POA: Diagnosis present

## 2021-08-04 DIAGNOSIS — R2681 Unsteadiness on feet: Secondary | ICD-10-CM

## 2021-08-04 DIAGNOSIS — I639 Cerebral infarction, unspecified: Secondary | ICD-10-CM | POA: Diagnosis present

## 2021-08-04 DIAGNOSIS — R482 Apraxia: Secondary | ICD-10-CM | POA: Insufficient documentation

## 2021-08-04 DIAGNOSIS — R269 Unspecified abnormalities of gait and mobility: Secondary | ICD-10-CM

## 2021-08-04 DIAGNOSIS — R278 Other lack of coordination: Secondary | ICD-10-CM | POA: Diagnosis present

## 2021-08-04 DIAGNOSIS — R1312 Dysphagia, oropharyngeal phase: Secondary | ICD-10-CM | POA: Diagnosis present

## 2021-08-04 DIAGNOSIS — R471 Dysarthria and anarthria: Secondary | ICD-10-CM | POA: Insufficient documentation

## 2021-08-04 DIAGNOSIS — M6281 Muscle weakness (generalized): Secondary | ICD-10-CM

## 2021-08-05 NOTE — Therapy (Signed)
[place] MAIN [facility] SERVICES [address] [place], [place], [address] Phone: [phone]   Fax:  [phone]  Occupational Therapy Evaluation  Patient Details  Name: Vernon Payne. MRN: [id number] Date of Birth: [birth year] Referring Provider (OT): Dr. Emily Filbert, PCP   Encounter Date: 08/04/2021   OT End of Session - 08/05/21 1402     Visit Number 1    Number of Visits 24    Date for OT Re-Evaluation 10/26/21    Authorization Time Period Reporting period beginning 08/04/21    OT Start Time 1415    OT Stop Time 1515    OT Time Calculation (min) 60 min    Activity Tolerance Patient tolerated treatment well    Behavior During Therapy WFL for tasks assessed/performed             Past Medical History:  Diagnosis Date   Actinic keratosis    Arthritis    Diabetes mellitus without complication (HCC)    GERD (gastroesophageal reflux disease)    Hard of hearing    left hearing aid   HLD (hyperlipidemia)    HTN (hypertension)    Hypotension, postural    IBS (irritable bowel syndrome)    Sleep apnea    Squamous cell carcinoma of skin 01/20/2010   Right chest. Well differentiated. Excised: 01/27/2010   Squamous cell carcinoma of skin 07/15/2019   R preauricular ant to lobe   Wears dentures    partial upper, implants on bottom    Past Surgical History:  Procedure Laterality Date   APPENDECTOMY     BACK SURGERY     lumbar   CATARACT EXTRACTION W/PHACO Right 08/29/2017   Procedure: CATARACT EXTRACTION PHACO AND INTRAOCULAR LENS PLACEMENT ([place]) RIGHT DIABETIC;  Surgeon: Leandrew Koyanagi, MD;  Location: [place];  Service: Ophthalmology;  Laterality: Right;  ISTENT INJECT   CATARACT EXTRACTION W/PHACO Left 11/21/2017   Procedure: CATARACT EXTRACTION PHACO AND INTRAOCULAR LENS PLACEMENT ([facility]);  Surgeon: Leandrew Koyanagi, MD;  Location: [place];  Service: Ophthalmology;  Laterality: Left;   EYE SURGERY  Left 11/21/2017   cataract excision   INSERTION OF ANTERIOR SEGMENT AQUEOUS DRAINAGE DEVICE (ISTENT) Right 08/29/2017   Procedure: (ISTENT);  Surgeon: Leandrew Koyanagi, MD;  Location: [place];  Service: Ophthalmology;  Laterality: Right;  Diabetic - oral meds   INSERTION OF ANTERIOR SEGMENT AQUEOUS DRAINAGE DEVICE (ISTENT) Left 11/21/2017   Procedure: INSERTION OF ANTERIOR SEGMENT AQUEOUS DRAINAGE DEVICE ([place])  INJECT DIABETES ISTENT INJECT;  Surgeon: Leandrew Koyanagi, MD;  Location: [place];  Service: Ophthalmology;  Laterality: Left;  Diabetic - oral meds   JOINT REPLACEMENT     left knee replacement   KYPHOPLASTY N/A 03/28/2019   Procedure: L1 KYPHOPLASTY;  Surgeon: Hessie Knows, MD;  Location: [facility];  Service: Orthopedics;  Laterality: N/A;   LASIK     ROTATOR CUFF REPAIR     TONSILLECTOMY      There were no vitals filed for this visit.   Subjective Assessment - 08/04/21 1359     Subjective  "He's been working a lot on his arm at home every day." (per spouse)    Pertinent History Marisol Glazer. is a 71 y.o. male with medical history significant of hypertension, diabetes, hyperlipidemia, sleep apnea comes to the emergency room after he started noticing right-sided weakness after he went to work. He started noticing decreased grip of his pen while writing and had on occasion slurred  speech. He started filling heaviness on the right side called his wife and was brought to the emergency room.    Limitations RUE weakness, lack of coordination, gait instability    Patient Stated Goals "To get my arm working."    Currently in Pain? Yes    Pain Score 3     Pain Location --   low back, bilat knees, R shoulder   Pain Descriptors / Indicators Aching;Sore    Pain Type Chronic pain    Pain Onset More than a month ago    Pain Frequency Intermittent    Aggravating Factors  increased walking/prolonged standing    Pain Relieving Factors rest    Effect of Pain  on Daily Activities difficulty with walking               [facility] OT Assessment - 08/05/21 1612       Assessment   Medical Diagnosis L CVA    Referring Provider (OT) Dr. Emily Filbert, PCP    Onset Date/Surgical Date 07/20/21    Hand Dominance Right    Next MD Visit July 5th to Dr. 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  Pt uses L non-dominant hand for dexterity with ADLs.  Pt is unable to manage buttons on shirt.      IADL   Meal Prep Able to complete simple cold meal and snack prep    Medication Management Is not capable of dispensing or managing own medication    Prior Level of Function Financial Management spouse manages all finances at baseline      Written Expression   Dominant Hand Right    Handwriting 100% legible;Increased time      Vision - History   Baseline Vision Wears glasses all the time    Visual History Glaucoma    Additional  Comments Pt reports some decreased acuity post CVA      Vision Assessment   Tracking/Visual Pursuits Able to track stimulus in all quads without difficulty    Visual Fields No apparent deficits      Cognition   Overall Cognitive Status Impaired/Different from baseline   Recommend SLP evaluation; spouse reports pt with decline in memory and mental fatigue  Observation/Other Assessments   Focus on Therapeutic Outcomes (FOTO)  48      Sensation   Light Touch Impaired by gross assessment   tingling throughout RUE but primarily mid arm   Proprioception Appears Intact      Coordination   Gross Motor Movements are Fluid and Coordinated No    Fine Motor Movements are Fluid and Coordinated No    Coordination and Movement Description mild apraxia throughout LUE    Right 9 Hole Peg Test 1 min, 52 sec    Left 9 Hole Peg Test 25 sec      Perception   Perception Within Functional Limits      Praxis   Praxis Impaired    Praxis Impairment Details Motor planning      AROM   Overall AROM Comments R shoulder with hx of rotator cuff repair, but overall BUEs WFL for ADLs      Strength   Overall Strength Comments LUE 5/5, R shoulder flex/abd 4/5, R elbow flex/ext 4/5, R wrist flex 4/5, R wrist ext 4-/5      Hand Function   Right Hand Grip (lbs) 35    Right Hand Lateral Pinch 3 lbs    Right Hand 3 Point Pinch 2 lbs    Left Hand Grip (lbs) 69    Left Hand Lateral Pinch 21 lbs    Left 3 point pinch 14 lbs    Comment able to oppose only IF to thumb on R hand            Occupational Therapy Treatment: Pt is a 71 y/o male present today to address RUE deficits following recent CVA that occurred on 07/20/21.  No therapy as of yet.  Pt resides with supportive spouse.  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  Pt presents with muscle weakness and moderate motor apraxia throughout RUE.  Pt using L non-dominant hand for efficient ADL  completion d/t lack of coordination in RUE.  Pt's goal is to utilize his RUE as he was able to prior to his CVA.  Self feeding with R hand and cutting food is a primary goal.  Pt will benefit from skilled OT to address the above noted deficits.  Pt in agreement with OT goals and poc.   SLP eval and tx recommended d/t spouse reporting pt with new memory deficits and mental fatigue post CVA.  Order requested.   Neuro re-ed: Instructed pt in R hand digit abd/add with hand flat on table top, hand taps to a tempo, and single digit taps for increasing R hand motor planning skills.  Vc to utilize L hand overtop R to stabilize alternate digits as needed during single digit taps.  Encouraged daily completion.  Good return demo.     OT Education - 08/05/21 1402     Education Details Role of OT, goals, poc    Person(s) Educated Patient;Spouse    Methods Explanation;Verbal cues    Comprehension Verbalized understanding;Need further instruction              OT Short Term Goals - 08/05/21 1628       OT SHORT TERM GOAL #1   Title Pt will be indep to perform HEP for improving RUE strength and coordination.    Baseline Eval: initiated, will conitnue to progress    Time 6    Period Weeks    Status New    Target Date 09/14/21  OT Long Term Goals - 08/05/21 1630       OT LONG TERM GOAL #1   Title Pt will increase FOTO score to 55 or better to indicate perceived significant functional performance with ADLs.    Baseline Eval: FOTO 43    Time 12    Period Weeks    Status New    Target Date 10/26/21      OT LONG TERM GOAL #2   Title Pt will increase R grip strength by 10 or more lbs for improving ability to hold and carry ADL supplies in R dominant hand.    Baseline Eval: R grip 35 (L 69)    Time 12    Period Weeks    Status New    Target Date 10/26/21      OT LONG TERM GOAL #3   Title Pt will increase R hand dexterity to independently manipulate eating utensils for  self feeding with dominant hand.    Baseline Eval: using L non-dominant hand to eat, unable to cut food.    Time 12    Period Weeks    Status New    Target Date 10/26/21      OT LONG TERM GOAL #4   Title Pt will increase R lateral pinch strength by 5 or more lbs to enable use of R hand to hold toothbrush.    Baseline Eval: R lateral pinch 3#; uses L hand to brush teeth    Time 12    Period Weeks    Status New    Target Date 10/26/21                   Plan - 08/05/21 1404     Clinical Impression Statement Pt is a 71 y/o male present today to address RUE deficits following recent CVA that occurred on 07/20/21.  No therapy as of yet.  Pt resides with supportive spouse.  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  Pt presents with muscle weakness and moderate motor apraxia throughout RUE.  Pt using L non-dominant hand for efficient ADL completion d/t lack of coordination in RUE.  Pt's goal is to utilize his RUE as he was able to prior to his CVA.  Self feeding with R hand and cutting food is a primary goal.  Pt will benefit from skilled OT to address the above noted deficits.  Pt in agreement with OT goals and poc.    OT Occupational Profile and History Problem Focused Assessment - Including review of records relating to presenting problem    Occupational performance deficits (Please refer to evaluation for details): ADL's;IADL's;Leisure    Body Structure / Function / Physical Skills ADL;GMC;UE functional use;Balance;Endurance;FMC;Strength;Coordination;Dexterity;IADL;Gait;Sensation    Cognitive Skills Memory;Attention;Energy/Drive    Rehab Potential Excellent    Clinical Decision Making Several treatment options, min-mod task modification necessary    Comorbidities Affecting Occupational Performance: May have comorbidities impacting occupational performance    Modification or Assistance to Complete Evaluation  Min-Moderate  modification of tasks or assist with assess necessary to complete eval    OT Frequency 2x / week    OT Duration 12 weeks    OT Treatment/Interventions Self-care/ADL training;Therapeutic exercise;Energy conservation;Therapist, nutritional;Therapeutic activities;Balance training;Moist Heat;Neuromuscular education;DME and/or AE instruction;Manual Therapy;Passive range of motion;Patient/family education    Recommended Other Services SLP to eval and tx for cognitive decline post CVA    Consulted and Agree with Plan of Care  Patient;Family member/caregiver    Family Member Consulted Spouse, Butch Penny             Patient will benefit from skilled therapeutic intervention in order to improve the following deficits and impairments:   Body Structure / Function / Physical Skills: ADL, GMC, UE functional use, Balance, Endurance, FMC, Strength, Coordination, Dexterity, IADL, Gait, Sensation Cognitive Skills: Memory, Attention, Energy/Drive     Visit Diagnosis: Muscle weakness (generalized)  Other lack of coordination    Problem List Patient Active Problem List   Diagnosis Date Noted   Acute CVA (cerebrovascular accident) ([place])    Right sided weakness 07/20/2021   Primary osteoarthritis of right knee 09/04/2017   Primary osteoarthritis of left knee 09/04/2017   Status post left partial knee replacement 09/04/2017   Yellow jacket sting 10/31/2016   HTN (hypertension) 10/31/2016   Diabetes ([place]) 10/31/2016   HLD (hyperlipidemia) 10/31/2016   Leta Speller, MS, OTR/L  Darleene Cleaver, OT 08/05/2021, 4:48 PM  [place] MAIN [facility] SERVICES [address] [place], [place], [address] Phone: [phone]   Fax:  [phone]  Name: Mikal Blasdell. MRN: [id number] Date of Birth: Apr 12, 1950

## 2021-08-05 NOTE — Therapy (Signed)
[place] MAIN [facility] SERVICES [address] [place], [place], [address] Phone: [phone]   Fax:  [phone]  Physical Therapy Evaluation  Patient Details  Name: Vernon Payne. MRN: [id number] Date of Birth: March 21, 1950 Referring Provider (PT): Dr. Emily Filbert   Encounter Date: 08/04/2021   PT End of Session - 08/05/21 1115     Visit Number 1    Number of Visits 25    Date for PT Re-Evaluation 10/27/21    Authorization Time Period Initial Cert 1/44/3154- 0/0/8676    Progress Note Due on Visit 10    PT Start Time 1515    PT Stop Time 1614    PT Time Calculation (min) 59 min    Equipment Utilized During Treatment Gait belt    Activity Tolerance Patient tolerated treatment well    Behavior During Therapy WFL for tasks assessed/performed             Past Medical History:  Diagnosis Date   Actinic keratosis    Arthritis    Diabetes mellitus without complication (HCC)    GERD (gastroesophageal reflux disease)    Hard of hearing    left hearing aid   HLD (hyperlipidemia)    HTN (hypertension)    Hypotension, postural    IBS (irritable bowel syndrome)    Sleep apnea    Squamous cell carcinoma of skin 01/20/2010   Right chest. Well differentiated. Excised: 01/27/2010   Squamous cell carcinoma of skin 07/15/2019   R preauricular ant to lobe   Wears dentures    partial upper, implants on bottom    Past Surgical History:  Procedure Laterality Date   APPENDECTOMY     BACK SURGERY     lumbar   CATARACT EXTRACTION W/PHACO Right 08/29/2017   Procedure: CATARACT EXTRACTION PHACO AND INTRAOCULAR LENS PLACEMENT ([place]) RIGHT DIABETIC;  Surgeon: Leandrew Koyanagi, MD;  Location: [place];  Service: Ophthalmology;  Laterality: Right;  ISTENT INJECT   CATARACT EXTRACTION W/PHACO Left 11/21/2017   Procedure: CATARACT EXTRACTION PHACO AND INTRAOCULAR LENS PLACEMENT ([facility]);  Surgeon: Leandrew Koyanagi, MD;  Location: [place];  Service: Ophthalmology;  Laterality: Left;   EYE SURGERY Left 11/21/2017   cataract excision   INSERTION OF ANTERIOR SEGMENT AQUEOUS DRAINAGE DEVICE (ISTENT) Right 08/29/2017   Procedure: (ISTENT);  Surgeon: Leandrew Koyanagi, MD;  Location: [place];  Service: Ophthalmology;  Laterality: Right;  Diabetic - oral meds   INSERTION OF ANTERIOR SEGMENT AQUEOUS DRAINAGE DEVICE (ISTENT) Left 11/21/2017   Procedure: INSERTION OF ANTERIOR SEGMENT AQUEOUS DRAINAGE DEVICE ([place])  INJECT DIABETES ISTENT INJECT;  Surgeon: Leandrew Koyanagi, MD;  Location: [place];  Service: Ophthalmology;  Laterality: Left;  Diabetic - oral meds   JOINT REPLACEMENT     left knee replacement   KYPHOPLASTY N/A 03/28/2019   Procedure: L1 KYPHOPLASTY;  Surgeon: Hessie Knows, MD;  Location: [facility];  Service: Orthopedics;  Laterality: N/A;   LASIK     ROTATOR CUFF REPAIR     TONSILLECTOMY      Vitals:   08/04/21 1532  BP: 90/69  Weight: 207 lb (93.9 kg)  Height: '6\' 3"'$  (1.905 m)      Subjective Assessment - 08/05/21 0842     Subjective Patient reports he is here due to right sided weakness from recent CVA on 07/20/2021. He reports some difficulty with walking- was using walker immediately after stroke but now using a quad cane intermittently. Reports most limited by  right hand weakness but does not endorse some imbalance yet no falls.    Patient is accompained by: Family member   wife   Pertinent History Patient presents post CVA on 07/20/2021. 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 PMH: Chickenpox   Diabetes (CMS-HCC)   Diabetes mellitus type 2, controlled, with complications (CMS-HCC) 0/93/2671   Glaucoma   Hemorrhoids   Hyperlipidemia   Hypertension   OSA on CPAP 01/24/2017   12/18, AHI 6, RDI 28, no hypoxia   Osteoarthritis    How long can you sit comfortably? No issues    Patient Stated Goals Patient reports he wants to walk better and be  independent.    Currently in Pain? Yes    Pain Score 3     Pain Location --   low back, Bilateral Knees, right shoulder   Pain Descriptors / Indicators Aching;Sore    Pain Type Chronic pain    Pain Onset More than a month ago    Pain Frequency Intermittent    Aggravating Factors  Increased walking/prolonged standing    Pain Relieving Factors Rest    Effect of Pain on Daily Activities Difficulty with walking                [facility] PT Assessment - 08/05/21 0001       Assessment   Medical Diagnosis L CVA    Referring Provider (PT) Dr. Emily Filbert    Onset Date/Surgical Date 07/20/21    Hand Dominance Right    Next MD Visit July 5th to Dr. Sabra Heck    Prior Therapy acute OT/PT evaluations only      Precautions   Precautions Fall      Restrictions   Weight Bearing Restrictions No      Balance Screen   Has the patient fallen in the past 6 months No    Has the patient had a decrease in activity level because of a fear of falling?  Yes    Is the patient reluctant to leave their home because of a fear of falling?  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    Leisure going on cruises, riding his tractor, watch [employer]      Cognition   Overall Cognitive Status Within Functional Limits for tasks assessed      Observation/Other Assessments   Focus on Therapeutic Outcomes (FOTO)  48      Standardized Balance Assessment   Standardized Balance Assessment Berg Balance Test       Berg Balance Test   Sit to Stand Able to stand  independently using hands    Standing Unsupported Able to stand safely 2 minutes    Sitting with Back Unsupported but Feet Supported on Floor or Stool Able to sit safely and securely 2 minutes    Stand to Sit Controls descent by using hands  Transfers Able to transfer safely, definite need of hands    Standing Unsupported with Eyes Closed Able to stand 10 seconds safely    Standing Unsupported with Feet Together Able to place feet together independently and stand 1 minute safely    From Standing, Reach Forward with Outstretched Arm Can reach confidently >25 cm (10")    From Standing Position, Pick up Object from Floor Able to pick up shoe, needs supervision    From Standing Position, Turn to Look Behind Over each Shoulder Looks behind from both sides and weight shifts well    Turn 360 Degrees Able to turn 360 degrees safely but slowly    Standing Unsupported, Alternately Place Feet on Step/Stool Able to stand independently and safely and complete 8 steps in 20 seconds    Standing Unsupported, One Foot in Front Able to plae foot ahead of the other independently and hold 30 seconds    Standing on One Leg Able to lift leg independently and hold equal to or more than 3 seconds    Total Score 47                OBJECTIVE  Musculoskeletal Tremor: Absent Bulk: Normal Tone: Normal, no clonus      Gait Decreased step length on right; wide base of support using QC today  Strength R/L 4/4  Hip flexion,Hip external rotation, Hip internal rotation,Hip abduction 4/4+ Knee extension 4/4+ Knee flexion 4/4+ Ankle Dorsiflexion   NEUROLOGICAL:  Sensation Grossly intact to light touch bilateral UEs/LEs as determined by testing dermatomes C2-T2/L2-S2 respectively. Proprioception and hot/cold testing deferred on this date   Reflexes R/L 2+/2+ Knee Jerk (L3/4) 2+/2+ Ankle Jerk (S1/2)   Coordination/Cerebellar Finger to  Nose: WNL Heel to Shin: WNL Rapid alternating movements: WNL   FUNCTIONAL OUTCOME MEASURES   Results Comments  BERG 47/56 Fall risk, in need of intervention  FOTO 48   TUG 19.37 seconds No device- indicative of increase fall risk  5TSTS 26.0 seconds BUE support  10 Meter Gait Speed Self-selected: 10.53 s = 0.95 m/s Below normative values for full community ambulation       ASSESSMENT Clinical Impression: Pt is a pleasant 71 year-old male referred for difficulty with balance with Dx of CVA. PT examination reveals deficits . Pt presents with deficits in right LE strength, impaired  gait with decreased cadence/speed and quality, and impaired balance with increased fall risk. Patient will benefit from skilled PT services to address these deficits, improve balance, and decrease risk for future falls.                          Objective measurements completed on examination: See above findings.      Rationale for Evaluation and Treatment Rehabilitation           PT Education - 08/05/21 1115     Education Details PT plan of care; purpose of functional outcome measure and goal planning    Person(s) Educated Patient    Methods Explanation;Demonstration;Tactile cues;Verbal cues    Comprehension Verbalized understanding;Returned demonstration;Verbal cues required;Need further instruction;Tactile cues required              PT Short Term Goals - 08/05/21 1130       PT SHORT TERM GOAL #1   Title Pt will be independent with HEP in order to improve strength and balance in order to decrease fall risk and improve function at home and work.  Baseline 08/04/2021= Patient presents with no formal HEP in place    Time 6    Period Weeks    Status New    Target Date 09/15/21               PT Long Term Goals - 08/05/21 1130       PT LONG TERM GOAL #1   Title Pt will improve FOTO to target score of 65 to display perceived improvements in ability to  complete ADL's.    Baseline 08/04/2021= 48    Time 12    Period Weeks    Status New    Target Date 10/27/21      PT LONG TERM GOAL #2   Title Pt will improve BERG by at least 3 points in order to demonstrate clinically significant improvement in balance.    Baseline 08/04/2021= 48    Time 12    Period Weeks    Status New    Target Date 10/27/21      PT LONG TERM GOAL #3   Title Pt will decrease 5TSTS by at least 3 seconds in order to demonstrate clinically significant improvement in LE strength.    Baseline 6/15= 26.0 sec with BUE Support    Time 12    Period Weeks    Status New    Target Date 10/27/21      PT LONG TERM GOAL #4   Title Pt will decrease TUG to below 15 seconds/decrease in order to demonstrate decreased fall risk.    Baseline 08/04/2021= 19.37 sec without an AD    Time 12    Period Weeks    Status New    Target Date 10/27/21      PT LONG TERM GOAL #5   Title Pt will increase 10MWT to > 1.0 m/s in order to demonstrate clinically significant improvement in community ambulation.    Baseline 08/04/2021= 0.95 m/s    Time 12    Period Weeks    Status New    Target Date 10/27/21                    Plan - 08/05/21 1116     Clinical Impression Statement Pt is a pleasant 71 year-old male referred for difficulty with balance with Dx of CVA. PT examination reveals deficits . Pt presents with deficits in right LE strength, impaired  gait with decreased cadence/speed and quality, and impaired balance with increased fall risk. Patient will benefit from skilled PT services to address these deficits, improve balance, and decrease risk for future falls.    Personal Factors and Comorbidities Comorbidity 3+    Comorbidities DM, HTN, OA    Examination-Activity Limitations Carry;Lift;Squat;Stairs    Examination-Participation Restrictions Community Activity;Yard Work    Biomedical scientist High     Rehab Potential Good    PT Frequency 2x / week    PT Duration 12 weeks    PT Treatment/Interventions ADLs/Self Care Home Management;Canalith Repostioning;Cryotherapy;Electrical Stimulation;Moist Heat;DME Instruction;Gait training;Stair training;Functional mobility training;Therapeutic activities;Therapeutic exercise;Balance training;Neuromuscular re-education;Patient/family education;Manual techniques;Passive range of motion;Dry needling;Vestibular    PT Next Visit Plan Instruct in balance and LE strengthening exercises.    PT Home Exercise Plan To be initiated next 1-2 visits    Consulted and Agree with Plan of Care Patient             Patient will benefit from skilled therapeutic intervention in order to improve the  following deficits and impairments:  Abnormal gait, Decreased activity tolerance, Decreased balance, Decreased coordination, Decreased endurance, Decreased mobility, Hypomobility, Difficulty walking, Impaired flexibility, Impaired UE functional use, Pain  Visit Diagnosis: Muscle weakness (generalized)  Difficulty in walking, not elsewhere classified  Abnormality of gait and mobility  Other lack of coordination  Other abnormalities of gait and mobility  Unsteadiness on feet     Problem List Patient Active Problem List   Diagnosis Date Noted   Acute CVA (cerebrovascular accident) ([place])    Right sided weakness 07/20/2021   Primary osteoarthritis of right knee 09/04/2017   Primary osteoarthritis of left knee 09/04/2017   Status post left partial knee replacement 09/04/2017   Yellow jacket sting 10/31/2016   HTN (hypertension) 10/31/2016   Diabetes ([place]) 10/31/2016   HLD (hyperlipidemia) 10/31/2016    Lewis Moccasin, PT 08/05/2021, 11:43 AM  [place] [address] [place], [place], [address] Phone: [phone]   Fax:  [phone]  Name: Jeovanni Heuring. MRN: [id number] Date of Birth:  04-07-1950

## 2021-08-08 ENCOUNTER — Ambulatory Visit: Payer: Medicare PPO

## 2021-08-08 DIAGNOSIS — R278 Other lack of coordination: Secondary | ICD-10-CM

## 2021-08-08 DIAGNOSIS — R2689 Other abnormalities of gait and mobility: Secondary | ICD-10-CM

## 2021-08-08 DIAGNOSIS — R262 Difficulty in walking, not elsewhere classified: Secondary | ICD-10-CM

## 2021-08-08 DIAGNOSIS — M6281 Muscle weakness (generalized): Secondary | ICD-10-CM | POA: Diagnosis not present

## 2021-08-08 DIAGNOSIS — R269 Unspecified abnormalities of gait and mobility: Secondary | ICD-10-CM

## 2021-08-08 DIAGNOSIS — R2681 Unsteadiness on feet: Secondary | ICD-10-CM

## 2021-08-08 NOTE — Therapy (Signed)
[place] MAIN [facility] SERVICES [address] [place], [place], [address] Phone: [phone]   Fax:  [phone]  Physical Therapy Treatment  Patient Details  Name: Vernon Payne. MRN: [id number] Date of Birth: 1950/07/14 Referring Provider (PT): Dr. Emily Filbert   Encounter Date: 08/08/2021   PT End of Session - 08/08/21 1511     Visit Number 2    Number of Visits 25    Date for PT Re-Evaluation 10/27/21    Authorization Time Period Initial Cert 7/85/8850- 03/29/7410    Progress Note Due on Visit 10    PT Start Time 0930    PT Stop Time 1012    PT Time Calculation (min) 42 min    Equipment Utilized During Treatment Gait belt    Activity Tolerance Patient tolerated treatment well    Behavior During Therapy WFL for tasks assessed/performed             Past Medical History:  Diagnosis Date   Actinic keratosis    Arthritis    Diabetes mellitus without complication (HCC)    GERD (gastroesophageal reflux disease)    Hard of hearing    left hearing aid   HLD (hyperlipidemia)    HTN (hypertension)    Hypotension, postural    IBS (irritable bowel syndrome)    Sleep apnea    Squamous cell carcinoma of skin 01/20/2010   Right chest. Well differentiated. Excised: 01/27/2010   Squamous cell carcinoma of skin 07/15/2019   R preauricular ant to lobe   Wears dentures    partial upper, implants on bottom    Past Surgical History:  Procedure Laterality Date   APPENDECTOMY     BACK SURGERY     lumbar   CATARACT EXTRACTION W/PHACO Right 08/29/2017   Procedure: CATARACT EXTRACTION PHACO AND INTRAOCULAR LENS PLACEMENT ([place]) RIGHT DIABETIC;  Surgeon: Leandrew Koyanagi, MD;  Location: [place];  Service: Ophthalmology;  Laterality: Right;  ISTENT INJECT   CATARACT EXTRACTION W/PHACO Left 11/21/2017   Procedure: CATARACT EXTRACTION PHACO AND INTRAOCULAR LENS PLACEMENT ([facility]);  Surgeon: Leandrew Koyanagi, MD;  Location: [place];  Service: Ophthalmology;  Laterality: Left;   EYE SURGERY Left 11/21/2017   cataract excision   INSERTION OF ANTERIOR SEGMENT AQUEOUS DRAINAGE DEVICE ([facility]) Right 08/29/2017   Procedure: (ISTENT);  Surgeon: Leandrew Koyanagi, MD;  Location: [place];  Service: Ophthalmology;  Laterality: Right;  Diabetic - oral meds   INSERTION OF ANTERIOR SEGMENT AQUEOUS DRAINAGE DEVICE (ISTENT) Left 11/21/2017   Procedure: INSERTION OF ANTERIOR SEGMENT AQUEOUS DRAINAGE DEVICE ([place])  INJECT DIABETES ISTENT INJECT;  Surgeon: Leandrew Koyanagi, MD;  Location: [place];  Service: Ophthalmology;  Laterality: Left;  Diabetic - oral meds   JOINT REPLACEMENT     left knee replacement   KYPHOPLASTY N/A 03/28/2019   Procedure: L1 KYPHOPLASTY;  Surgeon: Hessie Knows, MD;  Location: [facility];  Service: Orthopedics;  Laterality: N/A;   LASIK     ROTATOR CUFF REPAIR     TONSILLECTOMY     INTERVENTIONS:    Therapeutic Exercises:   Patient was instructed in Standing LE strengthening to add to HEP today.   Standing hip march Standing Hip ext Standing Hip abd Standing Hip flex Standing knee flex Standing [place] Standing Heel raises Standing Toe raises Patient performed 10-12 reps of each with Education provided throughout session via VC/TC and demonstration to facilitate movement at target joints and correct muscle activation for all testing and  exercises performed.   Also Performed at support bar:  Standing heel/toe static stance- Attempted to hold 30 sec x several trials Single leg stance- Patient able to hold between 5-10 sec today mult trials. Staggered standing on firm surface with horizontal Head turns  Rationale for Evaluation and Treatment Rehabilitation    Blood Pressure: 122/65 mmHg after therex  Subjective Assessment - 08/08/21 1504     Subjective Patient reports walking more without his walker. He states he continues to perform better and walking  more.    Patient is accompained by: Family member   wife   Pertinent History Patient presents post CVA on 07/20/2021. 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 PMH: Chickenpox   Diabetes (CMS-HCC)   Diabetes mellitus type 2, controlled, with complications (CMS-HCC) 05/13/4008   Glaucoma   Hemorrhoids   Hyperlipidemia   Hypertension   OSA on CPAP 01/24/2017   12/18, AHI 6, RDI 28, no hypoxia   Osteoarthritis    How long can you sit comfortably? No issues    Patient Stated Goals Patient reports he wants to walk better and be independent.    Currently in Pain? No/denies              Access Code: [email or web address] URL: [email or web address] Date: 08/08/2021 Prepared by: Sande Brothers  Exercises - Standing Hip Flexion March  - 3 x weekly - 3 sets - 10 reps - Standing Hip Abduction with Counter Support  - 3 x weekly - 3 sets - 10 reps - Standing Hip Extension with Counter Support  - 3 x weekly - 3 sets - 10 reps - Standing Knee Flexion with Counter Support  - 1 x daily - 3 x weekly - 3 sets - 10 reps - Mini Squat with Counter Support  - 1 x daily - 3 x weekly - 3 sets - 10 reps - Heel Raises with Counter Support  - 1 x daily - 3 x weekly - 3 sets - 10 reps - Heel Toe Raises with Counter Support  - 1 x daily - 3 x weekly - 3 sets - 10 reps                          PT Education - 08/08/21 1507     Education Details Exercise technique and HEP    Person(s) Educated Patient    Methods Explanation;Demonstration;Tactile cues;Verbal cues;Handout    Comprehension Need further instruction;Verbalized understanding;Returned demonstration;Verbal cues required;Tactile cues required              PT Short Term Goals - 08/05/21 1130       PT SHORT TERM GOAL #1   Title Pt will be independent with HEP in order to improve strength and balance in order to decrease fall risk and improve function at home and work.    Baseline 08/04/2021=  Patient presents with no formal HEP in place    Time 6    Period Weeks    Status New    Target Date 09/15/21               PT Long Term Goals - 08/05/21 1130       PT LONG TERM GOAL #1   Title Pt will improve FOTO to target score of 65 to display perceived improvements in ability to complete ADL's.    Baseline 08/04/2021= 48    Time 12    Period Weeks    Status  New    Target Date 10/27/21      PT LONG TERM GOAL #2   Title Pt will improve BERG by at least 3 points in order to demonstrate clinically significant improvement in balance.    Baseline 08/04/2021= 48    Time 12    Period Weeks    Status New    Target Date 10/27/21      PT LONG TERM GOAL #3   Title Pt will decrease 5TSTS by at least 3 seconds in order to demonstrate clinically significant improvement in LE strength.    Baseline 6/15= 26.0 sec with BUE Support    Time 12    Period Weeks    Status New    Target Date 10/27/21      PT LONG TERM GOAL #4   Title Pt will decrease TUG to below 15 seconds/decrease in order to demonstrate decreased fall risk.    Baseline 08/04/2021= 19.37 sec without an AD    Time 12    Period Weeks    Status New    Target Date 10/27/21      PT LONG TERM GOAL #5   Title Pt will increase 10MWT to > 1.0 m/s in order to demonstrate clinically significant improvement in community ambulation.    Baseline 08/04/2021= 0.95 m/s    Time 12    Period Weeks    Status New    Target Date 10/27/21                   Plan - 08/08/21 1512     Clinical Impression Statement Patient performed well overall today with instruction in therex for HEP. He was responsive to all VC and visual demonstration and able to perform well without report of any increased pain.Patient will benefit from skilled PT services to address these deficits, improve balance, and decrease risk for future falls.    Personal Factors and Comorbidities Comorbidity 3+    Comorbidities DM, HTN, OA    Examination-Activity  Limitations Carry;Lift;Squat;Stairs    Examination-Participation Restrictions Community Activity;Yard Work    Merchant navy officer Evolving/Moderate complexity    Rehab Potential Good    PT Frequency 2x / week    PT Duration 12 weeks    PT Treatment/Interventions ADLs/Self Care Home Management;Canalith Repostioning;Cryotherapy;Electrical Stimulation;Moist Heat;DME Instruction;Gait training;Stair training;Functional mobility training;Therapeutic activities;Therapeutic exercise;Balance training;Neuromuscular re-education;Patient/family education;Manual techniques;Passive range of motion;Dry needling;Vestibular    PT Next Visit Plan Instruct in balance and LE strengthening exercises.    PT Home Exercise Plan 08/08/2021=Access Code: [email or web address]  URL: [email or web address]    Consulted and Agree with Plan of Care Patient             Patient will benefit from skilled therapeutic intervention in order to improve the following deficits and impairments:  Abnormal gait, Decreased activity tolerance, Decreased balance, Decreased coordination, Decreased endurance, Decreased mobility, Hypomobility, Difficulty walking, Impaired flexibility, Impaired UE functional use, Pain  Visit Diagnosis: Muscle weakness (generalized)  Other lack of coordination  Difficulty in walking, not elsewhere classified  Abnormality of gait and mobility  Other abnormalities of gait and mobility  Unsteadiness on feet     Problem List Patient Active Problem List   Diagnosis Date Noted   Acute CVA (cerebrovascular accident) ([place])    Right sided weakness 07/20/2021   Primary osteoarthritis of right knee 09/04/2017   Primary osteoarthritis of left knee 09/04/2017   Status post left partial knee replacement 09/04/2017   Yellow jacket sting 10/31/2016   HTN (hypertension)  10/31/2016   Diabetes ([place]) 10/31/2016   HLD (hyperlipidemia) 10/31/2016    Lewis Moccasin, PT 08/08/2021, 10:23  PM  [place] MAIN [facility] SERVICES [address] [place], [place], [address] Phone: [phone]   Fax:  [phone]  Name: Christropher Gintz. MRN: [id number] Date of Birth: 11/06/1950

## 2021-08-08 NOTE — Therapy (Signed)
[place] MAIN [facility] SERVICES [address] [place], [place], [address] Phone: [phone]   Fax:  [phone]  Occupational Therapy Treatment  Patient Details  Name: Vernon Payne. MRN: [id number] Date of Birth: September 28, 1950 Referring Provider (OT): Dr. Emily Filbert, PCP   Encounter Date: 08/08/2021   OT End of Session - 08/08/21 1035     Visit Number 2    Number of Visits 24    Date for OT Re-Evaluation 10/26/21    Authorization Time Period Reporting period beginning 08/04/21    OT Start Time 0845    OT Stop Time 0930    OT Time Calculation (min) 45 min    Activity Tolerance Patient tolerated treatment well    Behavior During Therapy WFL for tasks assessed/performed             Past Medical History:  Diagnosis Date   Actinic keratosis    Arthritis    Diabetes mellitus without complication (HCC)    GERD (gastroesophageal reflux disease)    Hard of hearing    left hearing aid   HLD (hyperlipidemia)    HTN (hypertension)    Hypotension, postural    IBS (irritable bowel syndrome)    Sleep apnea    Squamous cell carcinoma of skin 01/20/2010   Right chest. Well differentiated. Excised: 01/27/2010   Squamous cell carcinoma of skin 07/15/2019   R preauricular ant to lobe   Wears dentures    partial upper, implants on bottom    Past Surgical History:  Procedure Laterality Date   APPENDECTOMY     BACK SURGERY     lumbar   CATARACT EXTRACTION W/PHACO Right 08/29/2017   Procedure: CATARACT EXTRACTION PHACO AND INTRAOCULAR LENS PLACEMENT ([place]) RIGHT DIABETIC;  Surgeon: Leandrew Koyanagi, MD;  Location: [facility];  Service: Ophthalmology;  Laterality: Right;  ISTENT INJECT   CATARACT EXTRACTION W/PHACO Left 11/21/2017   Procedure: CATARACT EXTRACTION PHACO AND INTRAOCULAR LENS PLACEMENT ([facility]);  Surgeon: Leandrew Koyanagi, MD;  Location: [facility];  Service: Ophthalmology;  Laterality: Left;   EYE SURGERY  Left 11/21/2017   cataract excision   INSERTION OF ANTERIOR SEGMENT AQUEOUS DRAINAGE DEVICE (ISTENT) Right 08/29/2017   Procedure: (ISTENT);  Surgeon: Leandrew Koyanagi, MD;  Location: [facility];  Service: Ophthalmology;  Laterality: Right;  Diabetic - oral meds   INSERTION OF ANTERIOR SEGMENT AQUEOUS DRAINAGE DEVICE (ISTENT) Left 11/21/2017   Procedure: INSERTION OF ANTERIOR SEGMENT AQUEOUS DRAINAGE DEVICE ([place])  INJECT DIABETES ISTENT INJECT;  Surgeon: Leandrew Koyanagi, MD;  Location: [facility];  Service: Ophthalmology;  Laterality: Left;  Diabetic - oral meds   JOINT REPLACEMENT     left knee replacement   KYPHOPLASTY N/A 03/28/2019   Procedure: L1 KYPHOPLASTY;  Surgeon: Hessie Knows, MD;  Location: [facility];  Service: Orthopedics;  Laterality: N/A;   LASIK     ROTATOR CUFF REPAIR     TONSILLECTOMY      There were no vitals filed for this visit.   Subjective Assessment - 08/08/21 0904     Subjective  He worked on squeezing his ball and picking up coins from a towel this weekend. (per spouse)    Pertinent History Juwon Scripter. is a 71 y.o. male with medical history significant of hypertension, diabetes, hyperlipidemia, sleep apnea comes to the emergency room after he started noticing right-sided weakness after he went to work. He started noticing decreased grip of his pen while writing and had  on occasion slurred speech. He started filling heaviness on the right side called his wife and was brought to the emergency room.    Limitations RUE weakness, lack of coordination, gait instability    Patient Stated Goals "To get my arm working."    Currently in Pain? Yes    Pain Score 3     Pain Location --   low back, bilat knees, R shoulder   Pain Orientation Right    Pain Descriptors / Indicators Aching;Sore    Pain Type Chronic pain    Pain Onset More than a month ago    Aggravating Factors  increased walking/prolonged standing    Pain Relieving Factors rest     Effect of Pain on Daily Activities difficulty with walking            Occupational Therapy Treatment: Therapeutic Exercise: Issued pink theraputty and instructed pt in strengthening and coordination exercises for R hand, including gross grasping, lateral/2 point/3 point pinching, digit abd/add, and digging coins out of putty.  Able to return demo with intermittent vc for technique to improve quality of movement.  Encouraged completion 5-10 min, 1-2x per day.  Handout issued.  Facilitated hand strengthening with use of hand gripper set at 11.2# to remove jumbo pegs from pegboard x3 trials using R hand.  Pegboard placed on an incline to engage R shoulder/upper arm strengthening.  Reviewed/completed finger taps for R hand on table, completing 3 sets 10 reps each digit.  Pt unable to lift 5th digit off table top, so instructed pt in positioning hand over edge of table or opposite hand to witness 5th digit rise, working towards extension up from table top.    Response to Treatment: Pt tolerated pink theraputty with instruction in grading down as needed by thinning out the putty for more successful pinching and squeezing.  Handout issued and pt/spouse able to follow with min vc for technique.  Able to grade up with hand gripper, starting at 6.6# but pt felt more of a challenge at 11.2#.  Rest breaks needed between each set with jumbo pegs.  Pt remains consistent with working on hand exercises at home, and stated that he squeezed his ball and worked on picking up pennies from the table placed on a towel over the weekend.  Pt will benefit from continued OT for developing increased strength and coordination throughout RUE in order to maximize indep with daily tasks.      OT Education - 08/08/21 1035     Education Details HEP with theraputty    Person(s) Educated Patient;Spouse    Methods Explanation;Verbal cues;Demonstration;Handout    Comprehension Verbalized understanding;Need further  instruction;Returned demonstration;Verbal cues required              OT Short Term Goals - 08/05/21 1628       OT SHORT TERM GOAL #1   Title Pt will be indep to perform HEP for improving RUE strength and coordination.    Baseline Eval: initiated, will conitnue to progress    Time 6    Period Weeks    Status New    Target Date 09/14/21               OT Long Term Goals - 08/05/21 1630       OT LONG TERM GOAL #1   Title Pt will increase FOTO score to 55 or better to indicate perceived significant functional performance with ADLs.    Baseline Eval: FOTO 43    Time  12    Period Weeks    Status New    Target Date 10/26/21      OT LONG TERM GOAL #2   Title Pt will increase R grip strength by 10 or more lbs for improving ability to hold and carry ADL supplies in R dominant hand.    Baseline Eval: R grip 35 (L 69)    Time 12    Period Weeks    Status New    Target Date 10/26/21      OT LONG TERM GOAL #3   Title Pt will increase R hand dexterity to independently manipulate eating utensils for self feeding with dominant hand.    Baseline Eval: using L non-dominant hand to eat, unable to cut food.    Time 12    Period Weeks    Status New    Target Date 10/26/21      OT LONG TERM GOAL #4   Title Pt will increase R lateral pinch strength by 5 or more lbs to enable use of R hand to hold toothbrush.    Baseline Eval: R lateral pinch 3#; uses L hand to brush teeth    Time 12    Period Weeks    Status New    Target Date 10/26/21              Plan - 08/08/21 1047     Clinical Impression Statement Pt tolerated pink theraputty with instruction in grading down as needed by thinning out the putty for more successful pinching and squeezing.  Handout issued and pt/spouse able to follow with min vc for technique.  Able to grade up with hand gripper, starting at 6.6# but pt felt more of a challenge at 11.2#.  Rest breaks needed between each set with jumbo pegs.  Pt remains  consistent with working on hand exercises at home, and stated that he squeezed his ball and worked on picking up pennies from the table placed on a towel over the weekend.  Pt will benefit from continued OT for developing increased strength and coordination throughout RUE in order to maximize indep with daily tasks.    OT Occupational Profile and History Problem Focused Assessment - Including review of records relating to presenting problem    Occupational performance deficits (Please refer to evaluation for details): ADL's;IADL's;Leisure    Body Structure / Function / Physical Skills ADL;GMC;UE functional use;Balance;Endurance;FMC;Strength;Coordination;Dexterity;IADL;Gait;Sensation    Cognitive Skills Memory;Attention;Energy/Drive    Rehab Potential Excellent    Clinical Decision Making Several treatment options, min-mod task modification necessary    Comorbidities Affecting Occupational Performance: May have comorbidities impacting occupational performance    Modification or Assistance to Complete Evaluation  Min-Moderate modification of tasks or assist with assess necessary to complete eval    OT Frequency 2x / week    OT Duration 12 weeks    OT Treatment/Interventions Self-care/ADL training;Therapeutic exercise;Energy conservation;Therapist, nutritional;Therapeutic activities;Balance training;Moist Heat;Neuromuscular education;DME and/or AE instruction;Manual Therapy;Passive range of motion;Patient/family education    Recommended Other Services SLP to eval and tx for cognitive decline post CVA    Consulted and Agree with Plan of Care Patient;Family member/caregiver    Family Member Consulted Spouse, Butch Penny             Patient will benefit from skilled therapeutic intervention in order to improve the following deficits and impairments:   Body Structure / Function / Physical Skills: ADL, GMC, UE functional use, Balance, Endurance, FMC, Strength, Coordination, Dexterity, IADL, Gait,  Sensation  Cognitive Skills: Memory, Attention, Energy/Drive     Visit Diagnosis: Muscle weakness (generalized)  Other lack of coordination    Problem List Patient Active Problem List   Diagnosis Date Noted   Acute CVA (cerebrovascular accident) ([place])    Right sided weakness 07/20/2021   Primary osteoarthritis of right knee 09/04/2017   Primary osteoarthritis of left knee 09/04/2017   Status post left partial knee replacement 09/04/2017   Yellow jacket sting 10/31/2016   HTN (hypertension) 10/31/2016   Diabetes ([place]) 10/31/2016   HLD (hyperlipidemia) 10/31/2016   Leta Speller, MS, OTR/L  Darleene Cleaver, OT 08/08/2021, 10:47 AM  [place] [address] [place], [place], [address] Phone: [phone]   Fax:  [phone]  Name: Zaydon Kinser. MRN: [id number] Date of Birth: [birth year]

## 2021-08-16 ENCOUNTER — Encounter: Payer: Self-pay | Admitting: Occupational Therapy

## 2021-08-16 ENCOUNTER — Ambulatory Visit: Payer: Medicare PPO

## 2021-08-16 ENCOUNTER — Ambulatory Visit: Payer: Medicare PPO | Admitting: Occupational Therapy

## 2021-08-16 DIAGNOSIS — M6281 Muscle weakness (generalized): Secondary | ICD-10-CM

## 2021-08-16 DIAGNOSIS — R269 Unspecified abnormalities of gait and mobility: Secondary | ICD-10-CM

## 2021-08-16 DIAGNOSIS — R2681 Unsteadiness on feet: Secondary | ICD-10-CM

## 2021-08-16 DIAGNOSIS — R262 Difficulty in walking, not elsewhere classified: Secondary | ICD-10-CM

## 2021-08-16 DIAGNOSIS — R2689 Other abnormalities of gait and mobility: Secondary | ICD-10-CM

## 2021-08-16 DIAGNOSIS — R278 Other lack of coordination: Secondary | ICD-10-CM

## 2021-08-16 NOTE — Therapy (Signed)
[facility] MAIN [facility] SERVICES [address] [place], [place], [address] Phone: [phone]   Fax:  [phone]  Physical Therapy Treatment  Patient Details  Name: Vernon Payne. MRN: [id number] Date of Birth: 02/04/1951 Referring Provider (PT): Dr. Bethann Punches   Encounter Date: 08/16/2021   PT End of Session - 08/16/21 1436     Visit Number 3    Number of Visits 25    Date for PT Re-Evaluation 10/27/21    Authorization Type [employer]    Authorization Time Period 08/04/2021- 10/27/2021    Progress Note Due on Visit 10    PT Start Time 1432    PT Stop Time 1512    PT Time Calculation (min) 40 min    Equipment Utilized During Treatment Gait belt    Activity Tolerance Patient tolerated treatment well;No increased pain    Behavior During Therapy WFL for tasks assessed/performed             Past Medical History:  Diagnosis Date   Actinic keratosis    Arthritis    Diabetes mellitus without complication (HCC)    GERD (gastroesophageal reflux disease)    Hard of hearing    left hearing aid   HLD (hyperlipidemia)    HTN (hypertension)    Hypotension, postural    IBS (irritable bowel syndrome)    Sleep apnea    Squamous cell carcinoma of skin 01/20/2010   Right chest. Well differentiated. Excised: 01/27/2010   Squamous cell carcinoma of skin 07/15/2019   R preauricular ant to lobe   Wears dentures    partial upper, implants on bottom    Past Surgical History:  Procedure Laterality Date   APPENDECTOMY     BACK SURGERY     lumbar   CATARACT EXTRACTION W/PHACO Right 08/29/2017   Procedure: CATARACT EXTRACTION PHACO AND INTRAOCULAR LENS PLACEMENT ([facility]) RIGHT DIABETIC;  Surgeon: Lockie Mola, MD;  Location: [facility];  Service: Ophthalmology;  Laterality: Right;  ISTENT INJECT   CATARACT EXTRACTION W/PHACO Left 11/21/2017   Procedure: CATARACT EXTRACTION PHACO AND INTRAOCULAR LENS PLACEMENT ([facility]);  Surgeon:  Lockie Mola, MD;  Location: [facility];  Service: Ophthalmology;  Laterality: Left;   EYE SURGERY Left 11/21/2017   cataract excision   INSERTION OF ANTERIOR SEGMENT AQUEOUS DRAINAGE DEVICE (IST[facility]) Right 08/29/2017   Procedure: (ISTENT);  Surgeon: Lockie Mola, MD;  Location: [facility];  Service: Ophthalmology;  Laterality: Right;  Diabetic - oral meds   INSERTION OF ANTERIOR SEGMENT AQUEOUS DRAINAGE DEVICE ([facility]) Left 11/21/2017   Procedure: INSERTION OF ANTERIOR SEGMENT AQUEOUS DRAINAGE DEVICE ([facility])  INJECT DIABETES ISTENT INJECT;  Surgeon: Lockie Mola, MD;  Location[facility];  Service: Ophthalmology;  Laterality: Left;  Diabetic - oral meds   JOINT REPLACEMENT     left knee replacement   KYPHOPLASTY N/A 03/28/2019   Procedure: L1 KYPHOPLASTY;  Surgeon: Kennedy Bucker, MD;  Location: [facility];  Service: Orthopedics;  Laterality: N/A;   LASIK     ROTATOR CUFF REPAIR     TONSILLECTOMY      There were no vitals filed for this visit.   Subjective Assessment - 08/16/21 1435     Subjective Pt doing ok today, his knee pain is a little worse than typically, around a 4 today bilat.    Pertinent History Patient presents post CVA on 07/20/2021. 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 PMH: Chickenpox   Diabetes (CMS-HCC)  Diabetes mellitus type 2, controlled, with complications (CMS-HCC) 10/15/2013   Glaucoma   Hemorrhoids   Hyperlipidemia   Hypertension   OSA on CPAP 01/24/2017   12/18, AHI 6, RDI 28, no hypoxia   Osteoarthritis    Currently in Pain? Yes    Pain Score 4     Pain Location --   bilat knees            INTERVENTIONS:  Therapeutic Exercises:    *Reviewed HEP today   -Standing hip march x30  -Standing Hip ext x15 -Standing Hip abd x15 -Standing knee flex x20 -Standing Minisquat x15 -Standing Heel raises x20 -Standing Toe raises x30  Patient performed 10-12 reps of each with Education  provided throughout session via VC/TC and demonstration to facilitate movement at target joints and correct muscle activation for all testing and exercises performed.    Also Performed at support bar:   -Tandem stance balance firm surface 2x30sec bilat  -Single leg stance- Patient able to hold between 5-10 sec today mult trials. -Staggered standing on firm surface with horizontal Head turns  -8 cone pickup from floor -78M retro AMB  -78M lateral side stepping   Rationale for Evaluation and Treatment Rehabilitation       PT Education - 08/16/21 1441     Education Details Reviewed HEP that was issued was session    Person(s) Educated Patient;Spouse    Methods Explanation;Demonstration    Comprehension Verbalized understanding              PT Short Term Goals - 08/05/21 1130       PT SHORT TERM GOAL #1   Title Pt will be independent with HEP in order to improve strength and balance in order to decrease fall risk and improve function at home and work.    Baseline 08/04/2021= Patient presents with no formal HEP in place    Time 6    Period Weeks    Status New    Target Date 09/15/21               PT Long Term Goals - 08/05/21 1130       PT LONG TERM GOAL #1   Title Pt will improve FOTO to target score of 65 to display perceived improvements in ability to complete ADL's.    Baseline 08/04/2021= 48    Time 12    Period Weeks    Status New    Target Date 10/27/21      PT LONG TERM GOAL #2   Title Pt will improve BERG by at least 3 points in order to demonstrate clinically significant improvement in balance.    Baseline 08/04/2021= 48    Time 12    Period Weeks    Status New    Target Date 10/27/21      PT LONG TERM GOAL #3   Title Pt will decrease 5TSTS by at least 3 seconds in order to demonstrate clinically significant improvement in LE strength.    Baseline 6/15= 26.0 sec with BUE Support    Time 12    Period Weeks    Status New    Target Date 10/27/21       PT LONG TERM GOAL #4   Title Pt will decrease TUG to below 15 seconds/decrease in order to demonstrate decreased fall risk.    Baseline 08/04/2021= 19.37 sec without an AD    Time 12    Period Weeks    Status New  Target Date 10/27/21      PT LONG TERM GOAL #5   Title Pt will increase to > 1.0 m/s in order to demonstrate clinically significant improvement in community ambulation.    Baseline 08/04/2021= 0.95 m/s    Time 12    Period Weeks    Status New    Target Date 10/27/21                   Plan - 08/16/21 1442     Clinical Impression Statement Continued with HEP education today. Pt able to self stabilize with fixed objects most of session, needs minA twice during head turns. Added in new balance activity at end of session. Pt having most provoked dizziness after 8 cone pickup.    Personal Factors and Comorbidities Comorbidity 3+    Comorbidities DM, HTN, OA    Examination-Activity Limitations Carry;Lift;Squat;Stairs    Examination-Participation Restrictions Community Activity;Yard Work    Public house manager High    Rehab Potential Good    PT Frequency 2x / week    PT Duration 12 weeks    PT Treatment/Interventions ADLs/Self Care Home Management;Canalith Repostioning;Cryotherapy;Electrical Stimulation;Moist Heat;DME Instruction;Gait training;Stair training;Functional mobility training;Therapeutic activities;Therapeutic exercise;Balance training;Neuromuscular re-education;Patient/family education;Manual techniques;Passive range of motion;Dry needling;Vestibular    PT Next Visit Plan Instruct in balance and LE strengthening exercises.    PT Home Exercise Plan 08/08/2021=Access Code: [email or web address]  URL: [email or web address]    Consulted and Agree with Plan of Care Patient;Family member/caregiver             Patient will benefit from skilled therapeutic intervention in order  to improve the following deficits and impairments:  Abnormal gait, Decreased activity tolerance, Decreased balance, Decreased coordination, Decreased endurance, Decreased mobility, Hypomobility, Difficulty walking, Impaired flexibility, Impaired UE functional use, Pain  Visit Diagnosis: Muscle weakness (generalized)  Other lack of coordination  Difficulty in walking, not elsewhere classified  Abnormality of gait and mobility  Other abnormalities of gait and mobility  Unsteadiness on feet     Problem List Patient Active Problem List   Diagnosis Date Noted   Acute CVA (cerebrovascular accident) (HCC)    Right sided weakness 07/20/2021   Primary osteoarthritis of right knee 09/04/2017   Primary osteoarthritis of left knee 09/04/2017   Status post left partial knee replacement 09/04/2017   Yellow jacket sting 10/31/2016   HTN (hypertension) 10/31/2016   Diabetes (HCC) 10/31/2016   HLD (hyperlipidemia) 10/31/2016   3:08 PM, 08/16/21 Rosamaria Lints, PT, DPT Physical Therapist - [facility]  Outpatient Physical Therapy- Main Campus [phone]     [place], PT 08/16/2021, 2:46 PM  [place] MAIN [facility] SERVICES [address] [place], [place], [address] Phone: [phone]   Fax:  [phone]  Name: Vernon Payne. MRN: [id number] Date of Birth: [birth year]

## 2021-08-17 ENCOUNTER — Ambulatory Visit: Payer: Medicare PPO | Admitting: Speech Pathology

## 2021-08-17 DIAGNOSIS — R1312 Dysphagia, oropharyngeal phase: Secondary | ICD-10-CM

## 2021-08-17 DIAGNOSIS — R471 Dysarthria and anarthria: Secondary | ICD-10-CM

## 2021-08-17 DIAGNOSIS — I639 Cerebral infarction, unspecified: Secondary | ICD-10-CM

## 2021-08-17 DIAGNOSIS — R41841 Cognitive communication deficit: Secondary | ICD-10-CM

## 2021-08-17 DIAGNOSIS — M6281 Muscle weakness (generalized): Secondary | ICD-10-CM | POA: Diagnosis not present

## 2021-08-18 ENCOUNTER — Ambulatory Visit: Payer: Medicare PPO

## 2021-08-18 DIAGNOSIS — M6281 Muscle weakness (generalized): Secondary | ICD-10-CM

## 2021-08-18 DIAGNOSIS — R269 Unspecified abnormalities of gait and mobility: Secondary | ICD-10-CM

## 2021-08-18 DIAGNOSIS — R262 Difficulty in walking, not elsewhere classified: Secondary | ICD-10-CM

## 2021-08-18 DIAGNOSIS — R482 Apraxia: Secondary | ICD-10-CM

## 2021-08-18 DIAGNOSIS — R278 Other lack of coordination: Secondary | ICD-10-CM

## 2021-08-18 DIAGNOSIS — R2681 Unsteadiness on feet: Secondary | ICD-10-CM

## 2021-08-18 DIAGNOSIS — R2689 Other abnormalities of gait and mobility: Secondary | ICD-10-CM

## 2021-08-18 NOTE — Therapy (Signed)
OUTPATIENT PHYSICAL THERAPY TREATMENT NOTE   Patient Name: Vernon Payne. MRN: [id number] DOB:01/01/1951, 71 y.o., male Today's Date: 08/19/2021  PCP:  Dr. Emily Filbert REFERRING PROVIDER: Dr. Emily Filbert   PT End of Session - 08/18/21 1603     Visit Number 4    Number of Visits 25    Date for PT Re-Evaluation 10/27/21    Authorization Type [employer]    Authorization Time Period 08/04/2021- 10/27/2021    Progress Note Due on Visit 10    PT Start Time 1603    PT Stop Time 1644    PT Time Calculation (min) 41 min    Equipment Utilized During Treatment Gait belt    Activity Tolerance Patient tolerated treatment well;No increased pain    Behavior During Therapy WFL for tasks assessed/performed             Past Medical History:  Diagnosis Date   Actinic keratosis    Arthritis    Diabetes mellitus without complication (HCC)    GERD (gastroesophageal reflux disease)    Hard of hearing    left hearing aid   HLD (hyperlipidemia)    HTN (hypertension)    Hypotension, postural    IBS (irritable bowel syndrome)    Sleep apnea    Squamous cell carcinoma of skin 01/20/2010   Right chest. Well differentiated. Excised: 01/27/2010   Squamous cell carcinoma of skin 07/15/2019   R preauricular ant to lobe   Wears dentures    partial upper, implants on bottom   Past Surgical History:  Procedure Laterality Date   APPENDECTOMY     BACK SURGERY     lumbar   CATARACT EXTRACTION W/PHACO Right 08/29/2017   Procedure: CATARACT EXTRACTION PHACO AND INTRAOCULAR LENS PLACEMENT ([place]) RIGHT DIABETIC;  Surgeon: Leandrew Koyanagi, MD;  Location: [place];  Service: Ophthalmology;  Laterality: Right;  ISTENT INJECT   CATARACT EXTRACTION W/PHACO Left 11/21/2017   Procedure: CATARACT EXTRACTION PHACO AND INTRAOCULAR LENS PLACEMENT ([facility]);  Surgeon: Leandrew Koyanagi, MD;  Location: [place];  Service: Ophthalmology;  Laterality: Left;   EYE SURGERY Left 11/21/2017    cataract excision   INSERTION OF ANTERIOR SEGMENT AQUEOUS DRAINAGE DEVICE (ISTENT) Right 08/29/2017   Procedure: (ISTENT);  Surgeon: Leandrew Koyanagi, MD;  Location: [place];  Service: Ophthalmology;  Laterality: Right;  Diabetic - oral meds   INSERTION OF ANTERIOR SEGMENT AQUEOUS DRAINAGE DEVICE (ISTENT) Left 11/21/2017   Procedure: INSERTION OF ANTERIOR SEGMENT AQUEOUS DRAINAGE DEVICE ([place])  INJECT DIABETES ISTENT INJECT;  Surgeon: Leandrew Koyanagi, MD;  Location: [place];  Service: Ophthalmology;  Laterality: Left;  Diabetic - oral meds   JOINT REPLACEMENT     left knee replacement   KYPHOPLASTY N/A 03/28/2019   Procedure: L1 KYPHOPLASTY;  Surgeon: Hessie Knows, MD;  Location: [facility];  Service: Orthopedics;  Laterality: N/A;   LASIK     ROTATOR CUFF REPAIR     TONSILLECTOMY     Patient Active Problem List   Diagnosis Date Noted   Acute CVA (cerebrovascular accident) ([place])    Right sided weakness 07/20/2021   Primary osteoarthritis of right knee 09/04/2017   Primary osteoarthritis of left knee 09/04/2017   Status post left partial knee replacement 09/04/2017   Yellow jacket sting 10/31/2016   HTN (hypertension) 10/31/2016   Diabetes ([place]) 10/31/2016   HLD (hyperlipidemia) 10/31/2016    REFERRING DIAG: I63.9 (ICD-10-CM) - CVA (cerebral vascular accident) ([place])  THERAPY DIAG:  Muscle weakness (  generalized)  Other lack of coordination  Difficulty in walking, not elsewhere classified  Abnormality of gait and mobility  Other abnormalities of gait and mobility  Unsteadiness on feet  Rationale for Evaluation and Treatment Rehabilitation  PERTINENT HISTORY: Patient presents post CVA on 07/20/2021. 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 PMH: Chickenpox   Diabetes (CMS-HCC)   Diabetes mellitus type 2, controlled, with complications (CMS-HCC) 05/13/4008   Glaucoma   Hemorrhoids   Hyperlipidemia   Hypertension   OSA  on CPAP 01/24/2017   12/18, AHI 6, RDI 28, no hypoxia   Osteoarthritis   PRECAUTIONS: fall  SUBJECTIVE: Patient reports doing okay- States knee are about a 4/10  PAIN:  Are you having pain? Yes: NPRS scale: 4/10 Pain location: B knee Pain description: ache Aggravating factors: standing/walking  Relieving factors: rest     TODAY'S TREATMENT:   Neuromuscular re-ed: -stand high knee march in // bars - stand on airex pad x 15 reps alt LE's -Side step over 1/2 foam x 15 reps each leg -forward/retro step over 1/2 foam x 15 reps each LE - Staggered standing on airex pad x 30 sec x 3 each LE Tandem standing in // bars x10-20 sec hold x mult attempts.  SLS - 5-7 sec hold x multiple attempts Gait in clinic x 300 feet with intermittent use of SPC   PATIENT EDUCATION: Education details: Exercise techniques Person educated: Patient Education method: Explanation, Demonstration, Tactile cues, and Verbal cues Education comprehension: verbalized understanding, returned demonstration, verbal cues required, tactile cues required, and needs further education   HOME EXERCISE PROGRAM: No updates this visit   PT Short Term Goals -       PT SHORT TERM GOAL #1   Title Pt will be independent with HEP in order to improve strength and balance in order to decrease fall risk and improve function at home and work.    Baseline 08/04/2021= Patient presents with no formal HEP in place    Time 6    Period Weeks    Status New    Target Date 09/15/21              PT Long Term Goals -      PT LONG TERM GOAL #1   Title Pt will improve FOTO to target score of 65 to display perceived improvements in ability to complete ADL's.    Baseline 08/04/2021= 48    Time 12    Period Weeks    Status New    Target Date 10/27/21      PT LONG TERM GOAL #2   Title Pt will improve BERG by at least 3 points in order to demonstrate clinically significant improvement in balance.    Baseline 08/04/2021= 48     Time 12    Period Weeks    Status New    Target Date 10/27/21      PT LONG TERM GOAL #3   Title Pt will decrease 5TSTS by at least 3 seconds in order to demonstrate clinically significant improvement in LE strength.    Baseline 6/15= 26.0 sec with BUE Support    Time 12    Period Weeks    Status New    Target Date 10/27/21      PT LONG TERM GOAL #4   Title Pt will decrease TUG to below 15 seconds/decrease in order to demonstrate decreased fall risk.    Baseline 08/04/2021= 19.37 sec without an AD  Time 12    Period Weeks    Status New    Target Date 10/27/21      PT LONG TERM GOAL #5   Title Pt will increase 10MWT to > 1.0 m/s in order to demonstrate clinically significant improvement in community ambulation.    Baseline 08/04/2021= 0.95 m/s    Time 12    Period Weeks    Status New    Target Date 10/27/21              Plan - 08/18/21 1643     Clinical Impression Statement Patient presents with good ability to follow all VC and visual demo for balance excercises today. He was challenged with SLS and tandem standing and did improve with practice. He was limited at times due to reported increased knee pain with activities but overall performed well without any significant report of pain and no LOB.    Personal Factors and Comorbidities Comorbidity 3+    Comorbidities DM, HTN, OA    Examination-Activity Limitations Carry;Lift;Squat;Stairs    Examination-Participation Restrictions Community Activity;Yard Work    [employer] Evolving/Moderate complexity    Rehab Potential Good    PT Frequency 2x / week    PT Duration 12 weeks    PT Treatment/Interventions ADLs/Self Care Home Management;Canalith Repostioning;Cryotherapy;Electrical Stimulation;Moist Heat;DME Instruction;Gait training;Stair training;Functional mobility training;Therapeutic activities;Therapeutic exercise;Balance training;Neuromuscular re-education;Patient/family education;Manual  techniques;Passive range of motion;Dry needling;Vestibular    PT Next Visit Plan Instruct in balance and LE strengthening exercises.    PT Home Exercise Plan 08/08/2021=Access Code: [email or web address]  URL: [email or web address]    Consulted and Agree with Plan of Care Patient;Family member/caregiver               Lewis Moccasin, PT 08/19/2021, 10:23 PM

## 2021-08-19 ENCOUNTER — Encounter: Payer: Self-pay | Admitting: Speech Pathology

## 2021-08-19 NOTE — Therapy (Signed)
OUTPATIENT OCCUPATIONAL THERAPY TREATMENT NOTE   Patient Name: Vernon Payne. MRN: [id number] DOB:June 16, 1950, 71 y.o., male Today's Date: 08/19/2021   REFERRING PROVIDER: Emily Filbert, MD   OT End of Session - 08/19/21 2150     Visit Number 4    Number of Visits 24    Date for OT Re-Evaluation 10/26/21    Authorization Time Period Reporting period beginning 08/04/21    OT Start Time 1645    OT Stop Time 1730    OT Time Calculation (min) 45 min    Activity Tolerance Patient tolerated treatment well    Behavior During Therapy WFL for tasks assessed/performed             Past Medical History:  Diagnosis Date   Actinic keratosis    Arthritis    Diabetes mellitus without complication (HCC)    GERD (gastroesophageal reflux disease)    Hard of hearing    left hearing aid   HLD (hyperlipidemia)    HTN (hypertension)    Hypotension, postural    IBS (irritable bowel syndrome)    Sleep apnea    Squamous cell carcinoma of skin 01/20/2010   Right chest. Well differentiated. Excised: 01/27/2010   Squamous cell carcinoma of skin 07/15/2019   R preauricular ant to lobe   Wears dentures    partial upper, implants on bottom   Past Surgical History:  Procedure Laterality Date   APPENDECTOMY     BACK SURGERY     lumbar   CATARACT EXTRACTION W/PHACO Right 08/29/2017   Procedure: CATARACT EXTRACTION PHACO AND INTRAOCULAR LENS PLACEMENT ([place]) RIGHT DIABETIC;  Surgeon: Leandrew Koyanagi, MD;  Location: [place];  Service: Ophthalmology;  Laterality: Right;  ISTENT INJECT   CATARACT EXTRACTION W/PHACO Left 11/21/2017   Procedure: CATARACT EXTRACTION PHACO AND INTRAOCULAR LENS PLACEMENT (I[facility]);  Surgeon: Leandrew Koyanagi, MD;  Location: [place];  Service: Ophthalmology;  Laterality: Left;   EYE SURGERY Left 11/21/2017   cataract excision   INSERTION OF ANTERIOR SEGMENT AQUEOUS DRAINAGE DEVICE (ISTENT) Right 08/29/2017   Procedure: (ISTENT);  Surgeon:  Leandrew Koyanagi, MD;  Location: [place];  Service: Ophthalmology;  Laterality: Right;  Diabetic - oral meds   INSERTION OF ANTERIOR SEGMENT AQUEOUS DRAINAGE DEVICE (ISTENT) Left 11/21/2017   Procedure: INSERTION OF ANTERIOR SEGMENT AQUEOUS DRAINAGE DEVICE ([place])  INJECT DIABETES ISTENT INJECT;  Surgeon: Leandrew Koyanagi, MD;  Location: [place];  Service: Ophthalmology;  Laterality: Left;  Diabetic - oral meds   JOINT REPLACEMENT     left knee replacement   KYPHOPLASTY N/A 03/28/2019   Procedure: L1 KYPHOPLASTY;  Surgeon: Hessie Knows, MD;  Location: [facility];  Service: Orthopedics;  Laterality: N/A;   LASIK     ROTATOR CUFF REPAIR     TONSILLECTOMY     Patient Active Problem List   Diagnosis Date Noted   Acute CVA (cerebrovascular accident) ([place])    Right sided weakness 07/20/2021   Primary osteoarthritis of right knee 09/04/2017   Primary osteoarthritis of left knee 09/04/2017   Status post left partial knee replacement 09/04/2017   Yellow jacket sting 10/31/2016   HTN (hypertension) 10/31/2016   Diabetes ([place]) 10/31/2016   HLD (hyperlipidemia) 10/31/2016     REFERRING DIAG: CVA  THERAPY DIAG:  Apraxia  Muscle weakness (generalized)  Other lack of coordination  Rationale for Evaluation and Treatment Rehabilitation  PERTINENT HISTORY: Vernon Payne. is a 71 y.o. male with medical history significant of hypertension, diabetes, hyperlipidemia,  sleep apnea comes to the emergency room after he started noticing right-sided weakness after he went to work. He started noticing decreased grip of his pen while writing and had on occasion slurred speech. He started filling heaviness on the right side called his wife and was brought to the emergency room.  PRECAUTIONS: None  SUBJECTIVE: Pt reports he's using his R hand more and is cutting his own food now.  PAIN:  Are you having pain? Yes: NPRS scale: 4/10","Pain location: bilateral knees, Pain  Description: None     OBJECTIVE:   TODAY'S TREATMENT:  Therapeutic Exercise: Facilitated hand strengthening with use of hand gripper set at 17.9# for 2 trials, and 23.4# for 1 trial to remove jumbo pegs from pegboard using R hand.  Reviewed/completed digit isolation exercises, including single digit taps on table, hand over hand to keep other digits down, and digit abd/add.  Instructed pt in passive stretching techniques for increasing digit extension at the MCPs and PIP joints in R hand digits, as pt continues to report stiffness and difficulty placing hand flat on table top.  Good return demo of passive stretching techniques.   Neuro re-ed: Facilitated R hand FMC/dexterity skills working with grooved pegs and pegboard.  Pt was cued to move pegs from horizontal to vertical position, and work on turning pegs within fingertips to line up pegs in the groove, working to limit gross motor movements to turn the peg.  Pt practiced storage of multiple pegs in hand (1 row) and moving to fingertips to discard, though pt struggled with this and tended to discard pegs from the ulnar side of his hand.     PATIENT EDUCATION: Education details: HEP progression, including passive digit ext stretching.  Person educated: Patient and Spouse Education method: Explanation, Demonstration, and Tactile cues Education comprehension: verbalized understanding, returned demonstration, verbal cues required, and needs further education   HOME EXERCISE PROGRAM Pt. Was upgraded to green theraputty resistance for gross gripping. Pt. To continue with remaining hand exercises with pink theraputty.   OT Short Term Goals -        OT SHORT TERM GOAL #1   Title Pt will be indep to perform HEP for improving RUE strength and coordination.    Baseline Eval: initiated, will conitnue to progress    Time 6    Period Weeks    Status New    Target Date 09/14/21              OT Long Term Goals -        OT LONG TERM  GOAL #1   Title Pt will increase FOTO score to 55 or better to indicate perceived significant functional performance with ADLs.    Baseline Eval: FOTO 43    Time 12    Period Weeks    Status New    Target Date 10/26/21      OT LONG TERM GOAL #2   Title Pt will increase R grip strength by 10 or more lbs for improving ability to hold and carry ADL supplies in R dominant hand.    Baseline Eval: R grip 35 (L 69)    Time 12    Period Weeks    Status New    Target Date 10/26/21      OT LONG TERM GOAL #3   Title Pt will increase R hand dexterity to independently manipulate eating utensils for self feeding with dominant hand.    Baseline Eval: using L non-dominant hand to eat,  unable to cut food.    Time 12    Period Weeks    Status New    Target Date 10/26/21      OT LONG TERM GOAL #4   Title Pt will increase R lateral pinch strength by 5 or more lbs to enable use of R hand to hold toothbrush.    Baseline Eval: R lateral pinch 3#; uses L hand to brush teeth    Time 12    Period Weeks    Status New    Target Date 10/26/21              Plan -     Clinical Impression Statement Pt reports he is now cutting his food with both hands, but extra time is needed.  Pt was able to progress to use of hand gripper at 23.4# for his 3rd trial of jumbo peg removal.  Pt continues to develop strength and coordination in R hand, and will continue to benefit from skilled OT to increase strength and dexterity for better efficiency when engaging R hand into daily tasks.    OT Occupational Profile and History Problem Focused Assessment - Including review of records relating to presenting problem    Occupational performance deficits (Please refer to evaluation for details): ADL's;IADL's;Leisure    Body Structure / Function / Physical Skills ADL;GMC;UE functional use;Balance;Endurance;FMC;Strength;Coordination;Dexterity;IADL;Gait;Sensation    Cognitive Skills Memory;Attention;Energy/Drive    Rehab  Potential Excellent    Clinical Decision Making Several treatment options, min-mod task modification necessary    Comorbidities Affecting Occupational Performance: May have comorbidities impacting occupational performance    Modification or Assistance to Complete Evaluation  Min-Moderate modification of tasks or assist with assess necessary to complete eval    OT Frequency 2x / week    OT Duration 12 weeks    OT Treatment/Interventions Self-care/ADL training;Therapeutic exercise;Energy conservation;Therapist, nutritional;Therapeutic activities;Balance training;Moist Heat;Neuromuscular education;DME and/or AE instruction;Manual Therapy;Passive range of motion;Patient/family education    Recommended Other Services SLP to eval and tx for cognitive decline post CVA    Consulted and Agree with Plan of Care Patient;Family member/caregiver    Family Member Consulted Spouse, Rella Larve, MS, OTR/L  Darleene Cleaver, OT 08/19/2021, 9:51 PM

## 2021-08-19 NOTE — Therapy (Signed)
OUTPATIENT SPEECH LANGUAGE PATHOLOGY EVALUATION   Patient Name: Vernon Payne. MRN: [id number] DOB:Apr 26, 1950, 71 y.o., male 67 Date: 08/19/2021  PCP: Emily Filbert, MD REFERRING PROVIDER: Emily Filbert, MD   End of Session - 08/18/21 1348     Visit Number 1    Number of Visits 25    Date for SLP Re-Evaluation 11/10/21    Authorization Type [employer] PPO    SLP Start Time 35    SLP Stop Time  7209    SLP Time Calculation (min) 75 min    Activity Tolerance Patient tolerated treatment well             Past Medical History:  Diagnosis Date   Actinic keratosis    Arthritis    Diabetes mellitus without complication (HCC)    GERD (gastroesophageal reflux disease)    Hard of hearing    left hearing aid   HLD (hyperlipidemia)    HTN (hypertension)    Hypotension, postural    IBS (irritable bowel syndrome)    Sleep apnea    Squamous cell carcinoma of skin 01/20/2010   Right chest. Well differentiated. Excised: 01/27/2010   Squamous cell carcinoma of skin 07/15/2019   R preauricular ant to lobe   Wears dentures    partial upper, implants on bottom   Past Surgical History:  Procedure Laterality Date   APPENDECTOMY     BACK SURGERY     lumbar   CATARACT EXTRACTION W/PHACO Right 08/29/2017   Procedure: CATARACT EXTRACTION PHACO AND INTRAOCULAR LENS PLACEMENT ([place]) RIGHT DIABETIC;  Surgeon: Leandrew Koyanagi, MD;  Location: [place];  Service: Ophthalmology;  Laterality: Right;  ISTENT INJECT   CATARACT EXTRACTION W/PHACO Left 11/21/2017   Procedure: CATARACT EXTRACTION PHACO AND INTRAOCULAR LENS PLACEMENT ([facility]);  Surgeon: Leandrew Koyanagi, MD;  Location: [place];  Service: Ophthalmology;  Laterality: Left;   EYE SURGERY Left 11/21/2017   cataract excision   INSERTION OF ANTERIOR SEGMENT AQUEOUS DRAINAGE DEVICE (ISTENT) Right 08/29/2017   Procedure: (ISTENT);  Surgeon: Leandrew Koyanagi, MD;  Location: [place];   Service: Ophthalmology;  Laterality: Right;  Diabetic - oral meds   INSERTION OF ANTERIOR SEGMENT AQUEOUS DRAINAGE DEVICE (ISTENT) Left 11/21/2017   Procedure: INSERTION OF ANTERIOR SEGMENT AQUEOUS DRAINAGE DEVICE ([place])  INJECT DIABETES ISTENT INJECT;  Surgeon: Leandrew Koyanagi, MD;  Location: [facility];  Service: Ophthalmology;  Laterality: Left;  Diabetic - oral meds   JOINT REPLACEMENT     left knee replacement   KYPHOPLASTY N/A 03/28/2019   Procedure: L1 KYPHOPLASTY;  Surgeon: Hessie Knows, MD;  Location: [facility];  Service: Orthopedics;  Laterality: N/A;   LASIK     ROTATOR CUFF REPAIR     TONSILLECTOMY     Patient Active Problem List   Diagnosis Date Noted   Acute CVA (cerebrovascular accident) ([place])    Right sided weakness 07/20/2021   Primary osteoarthritis of right knee 09/04/2017   Primary osteoarthritis of left knee 09/04/2017   Status post left partial knee replacement 09/04/2017   Yellow jacket sting 10/31/2016   HTN (hypertension) 10/31/2016   Diabetes ([place]) 10/31/2016   HLD (hyperlipidemia) 10/31/2016    ONSET DATE: 07/20/2021   REFERRING DIAG:  O70.962 (ICD-10-CM) - Cognitive communication deficit  R13.10 (ICD-10-CM) - Dysphagia  I63.9 (ICD-10-CM) - CVA (cerebral vascular accident) ([place])    THERAPY DIAG:  Cognitive communication deficit  Dysarthria and anarthria  Dysphagia, oropharyngeal phase  Acute CVA (cerebrovascular accident) ([place])  Rationale  for Evaluation and Treatment Rehabilitation  SUBJECTIVE:   SUBJECTIVE STATEMENT: Pt with flat affect, putting his head in his hands throughout assessment, pt and his wife known to this writer Pt accompanied by: self and significant other  PERTINENT HISTORY: HTN, HLD  PAIN:  Are you having pain? No   FALLS: Has patient fallen in last 6 months?  No  LIVING ENVIRONMENT: Lives with: lives with their spouse Lives in: House/apartment  PLOF:  Level of assistance: Independent with ADLs,  Independent with IADLs Employment: Full-time employment   PATIENT GOALS "not to be so tired all of the time"  OBJECTIVE:   DIAGNOSTIC FINDINGS:   MRI - 07/20/2021 Small acute infarct of the left corona radiata extending toward the lentiform nucleus.  Mild chronic microvascular ischemic changes.   COGNITION: Overall cognitive status: Difficulty to assess due to: flat affect, fatigue Areas of impairment:  Intermittent functional errors observed Functional deficits: inconsistently decreased recall, slowed processing  AUDITORY COMPREHENSION: Overall auditory comprehension: Appears intact YES/NO questions: Appears intact Following directions: Appears intact Conversation: Complex Interfering components:  N/A Effective technique:  N/A  READING COMPREHENSION: Intact  EXPRESSION: verbal  VERBAL EXPRESSION:  Overall verbal expression: Appears intact Level of generative/spontaneous verbalization: conversation Pragmatics: Impaired: abnormal effect, eye contact, and monotone Comments: pt personality/social presence much more subdued than at baseline Interfering components: fatigue Effective technique:  N/A Non-verbal means of communication: N/A  WRITTEN EXPRESSION: Dominant hand: right  Written expression: Impaired: word  MOTOR SPEECH: Overall motor speech: impaired Level of impairment: Conversation Respiration: diaphragmatic/abdominal breathing Phonation: normal Resonance: WFL Articulation: Impaired: conversation Intelligibility: Intelligibility reduced Motor planning: Impaired: aware Motor speech errors: inconsistent Interfering components:  N/A Effective technique: slow rate and increased vocal intensity  ORAL MOTOR EXAMINATION Overall status: WFL Comments: no focal deficits at this time    CLINICAL SWALLOW ASSESSMENT:   Current diet: regular and thin liquids Dentition: adequate natural dentition Patient directly observed with POs: Yes: thin liquids   Feeding: able to feed self Liquids provided by: cup and straw Oral phase signs and symptoms:  N/A Pharyngeal phase signs and symptoms: suspected delayed swallow initiation Comments: pt states that he has difficulty swallowing an is afraid to go to sleep at night because he has to work hard to swallow   STANDARDIZED ASSESSMENTS: CLQT: Attention: WNL, Memory: WNL, Executive Function: WNL, Language: WNL, Visuospatial Skills: WNL, and Clock Drawing: WNL and    TRIAL MAKING TEST (TMT) Parts A & B Both parts of the Trail Making Test consist of 25 circles distributed over a sheet of paper. In Part A, the circles are numbered 1 - 25, and the patient should draw lines to connect the numbers in ascending order. In Part B, the circles include both numbers (1 - 13) and letters (A - L); as in Part A, the patient draws lines to connect the circles in an ascending pattern, but with the added task of alternating between the numbers and letters (i.e., 1-A-2-B-3-C, etc.).   Results for both TMT A and B are reported as the number of seconds required to complete the task; therefore, higher scores reveal greater impairment.  Results:  Trail A - WNL - 35 seconds (n=29 seconds; Deficient > 78 seconds) Trail B - WNL - 166 seconds (n= 75 seconds; Deficient > 273 seconds)   PATIENT REPORTED OUTCOME MEASURES (PROM): The Communication Effectiveness Survey is a patient-reported outcome measure in which the patient rates their own effectiveness in different communication situations. A higher score indicates greater effectiveness.  Pt's self-rating was 11/32.    PATIENT EDUCATION: Education details: results of this assessment, ST POC Person educated: Patient and Spouse Education method: Explanation Education comprehension: verbalized understanding and needs further education     GOALS: Goals reviewed with patient? Yes  SHORT TERM GOALS: Target date: 10 sessions  Pt will use speech intelligibility strategies  to achieve > 95% speech intelligibility at the simple conversation level.  Baseline: imprecise articulation Goal status: INITIAL  2.  Pt will completed instrumental swallow assessment.  Baseline: to be scheduled Goal status: INITIAL   LONG TERM GOALS: Target date: 11/09/2021   Pt will utilize speech intelligibility strategies to achieve > 95% intelligibility at the complex conversation level to communicate abstract concepts/thoughts Baseline: < 80% Goal status: INITIAL  2.  Pt will consume least restrictive diet with minimal overt s/s of dysphagia and aspiration.  Baseline: intermittent s/s  Goal status: INITIAL   ASSESSMENT:  CLINICAL IMPRESSION: Patient is a 71 y.o. male who was seen today for cognitive communication and clinical swallow evaluations.    OBJECTIVE IMPAIRMENTS include dysarthria and dysphagia. These impairments are limiting patient from return to work, ADLs/IADLs, effectively communicating at home and in community, and safety when swallowing. Factors affecting potential to achieve goals and functional outcome are  flat affect, fatigue, "my head feels heavy ". Patient will benefit from skilled SLP services to address above impairments and improve overall function.  REHAB POTENTIAL: Excellent  PLAN: SLP FREQUENCY: 1-2x/week  SLP DURATION: 12 weeks  PLANNED INTERVENTIONS: Diet toleration management , Functional tasks, SLP instruction and feedback, Compensatory strategies, and Patient/family education    Oneill Bais B. Rutherford Nail, M.S., CCC-SLP, Mining engineer Certified Brain Injury [place]  [place] Office [phone] Ascom [phone] Fax [phone]

## 2021-08-24 ENCOUNTER — Ambulatory Visit: Payer: Medicare PPO | Attending: Internal Medicine

## 2021-08-24 ENCOUNTER — Ambulatory Visit: Payer: Medicare PPO

## 2021-08-24 DIAGNOSIS — R269 Unspecified abnormalities of gait and mobility: Secondary | ICD-10-CM | POA: Insufficient documentation

## 2021-08-24 DIAGNOSIS — R278 Other lack of coordination: Secondary | ICD-10-CM

## 2021-08-24 DIAGNOSIS — R262 Difficulty in walking, not elsewhere classified: Secondary | ICD-10-CM | POA: Insufficient documentation

## 2021-08-24 DIAGNOSIS — R2689 Other abnormalities of gait and mobility: Secondary | ICD-10-CM | POA: Diagnosis present

## 2021-08-24 DIAGNOSIS — I639 Cerebral infarction, unspecified: Secondary | ICD-10-CM | POA: Insufficient documentation

## 2021-08-24 DIAGNOSIS — M6281 Muscle weakness (generalized): Secondary | ICD-10-CM | POA: Insufficient documentation

## 2021-08-24 DIAGNOSIS — R471 Dysarthria and anarthria: Secondary | ICD-10-CM | POA: Insufficient documentation

## 2021-08-24 DIAGNOSIS — R41841 Cognitive communication deficit: Secondary | ICD-10-CM | POA: Diagnosis present

## 2021-08-24 DIAGNOSIS — R2681 Unsteadiness on feet: Secondary | ICD-10-CM | POA: Insufficient documentation

## 2021-08-24 NOTE — Therapy (Signed)
OUTPATIENT OCCUPATIONAL THERAPY TREATMENT NOTE   Patient Name: Vernon Payne. MRN: [id number] DOB:1951-02-12, 71 y.o., male Today's Date: 08/24/2021   REFERRING PROVIDER: Emily Filbert, MD   OT End of Session - 08/24/21 [id number]     Visit Number 5    Number of Visits 24    Date for OT Re-Evaluation 10/26/21    Authorization Time Period Reporting period beginning 08/04/21    OT Start Time 0845    OT Stop Time 0930    OT Time Calculation (min) 45 min    Activity Tolerance Patient tolerated treatment well    Behavior During Therapy WFL for tasks assessed/performed             Past Medical History:  Diagnosis Date   Actinic keratosis    Arthritis    Diabetes mellitus without complication (HCC)    GERD (gastroesophageal reflux disease)    Hard of hearing    left hearing aid   HLD (hyperlipidemia)    HTN (hypertension)    Hypotension, postural    IBS (irritable bowel syndrome)    Sleep apnea    Squamous cell carcinoma of skin 01/20/2010   Right chest. Well differentiated. Excised: 01/27/2010   Squamous cell carcinoma of skin 07/15/2019   R preauricular ant to lobe   Wears dentures    partial upper, implants on bottom   Past Surgical History:  Procedure Laterality Date   APPENDECTOMY     BACK SURGERY     lumbar   CATARACT EXTRACTION W/PHACO Right 08/29/2017   Procedure: CATARACT EXTRACTION PHACO AND INTRAOCULAR LENS PLACEMENT ([place]) RIGHT DIABETIC;  Surgeon: Leandrew Koyanagi, MD;  Location: [place];  Service: Ophthalmology;  Laterality: Right;  ISTENT INJECT   CATARACT EXTRACTION W/PHACO Left 11/21/2017   Procedure: CATARACT EXTRACTION PHACO AND INTRAOCULAR LENS PLACEMENT ([facility]);  Surgeon: Leandrew Koyanagi, MD;  Location: [place];  Service: Ophthalmology;  Laterality: Left;   EYE SURGERY Left 11/21/2017   cataract excision   INSERTION OF ANTERIOR SEGMENT AQUEOUS DRAINAGE DEVICE (ISTENT) Right 08/29/2017   Procedure: (ISTENT);  Surgeon:  Leandrew Koyanagi, MD;  Location: [place];  Service: Ophthalmology;  Laterality: Right;  Diabetic - oral meds   INSERTION OF ANTERIOR SEGMENT AQUEOUS DRAINAGE DEVICE (ISTENT) Left 11/21/2017   Procedure: INSERTION OF ANTERIOR SEGMENT AQUEOUS DRAINAGE DEVICE ([place])  INJECT DIABETES ISTENT INJECT;  Surgeon: Leandrew Koyanagi, MD;  Location: [place];  Service: Ophthalmology;  Laterality: Left;  Diabetic - oral meds   JOINT REPLACEMENT     left knee replacement   KYPHOPLASTY N/A 03/28/2019   Procedure: L1 KYPHOPLASTY;  Surgeon: Hessie Knows, MD;  Location: [facility];  Service: Orthopedics;  Laterality: N/A;   LASIK     ROTATOR CUFF REPAIR     TONSILLECTOMY     Patient Active Problem List   Diagnosis Date Noted   Acute CVA (cerebrovascular accident) ([place])    Right sided weakness 07/20/2021   Primary osteoarthritis of right knee 09/04/2017   Primary osteoarthritis of left knee 09/04/2017   Status post left partial knee replacement 09/04/2017   Yellow jacket sting 10/31/2016   HTN (hypertension) 10/31/2016   Diabetes ([place]) 10/31/2016   HLD (hyperlipidemia) 10/31/2016     REFERRING DIAG: CVA  THERAPY DIAG:  Muscle weakness (generalized)  Other lack of coordination  Rationale for Evaluation and Treatment Rehabilitation  PERTINENT HISTORY: Vernon Payne. is a 71 y.o. male with medical history significant of hypertension, diabetes, hyperlipidemia, sleep apnea  comes to the emergency room after he started noticing right-sided weakness after he went to work. He started noticing decreased grip of his pen while writing and had on occasion slurred speech. He started filling heaviness on the right side called his wife and was brought to the emergency room.  PRECAUTIONS: None  SUBJECTIVE: "He's using his R hand now to eat." (Per spouse)  PAIN:  Are you having pain? Yes: NPRS scale: 4/10", Pain location: bilateral knees, Pain Description: sore     OBJECTIVE:    TODAY'S TREATMENT:  Therapeutic Exercise: Facilitated hand strengthening with use of hand gripper set at 17.9# for 3 trials to remove jumbo pegs from pegboard using R hand.  Pegboard placed on an incline at table top level to engage R shoulder with forward reaching.  Pt discarded pegs into container placed at shoulder level to facilitate forward, contralateral, and ipsilateral reaching.  Reviewed/completed digit extension stretching to target MCP and PIP joints where pt continues to report stiffness.  Min vc required for technique and holding stretch for 15-20 sec on each digit.   Neuro re-ed: Facilitated R hand dexterity molding small pieces of green putty into a small ball.  Cues for technique to move putty pieces from palm to fingertips.  Extra time needed for transitioning putty within hand to fingertips.   Self Care: Practiced cutting skills with table knife and fork and green theraputty, using R hand for sawing motion with the knife.  Practiced holding fork in R hand to stab small pieces of food (putty) with fairly good accuracy, mild ataxia noted.    PATIENT EDUCATION: Education details: HEP progression; include molding putty into small balls with R hand and practicing with fork/knife with putty Person educated: Patient and Spouse Education method: vc, demo Education comprehension: verbalized understanding, returned demonstration, verbal cues required, and needs further education   Gage for strengthening and dexterity in the R hand.   OT Short Term Goals -        OT SHORT TERM GOAL #1   Title Pt will be indep to perform HEP for improving RUE strength and coordination.    Baseline Eval: initiated, will conitnue to progress    Time 6    Period Weeks    Status New    Target Date 09/14/21              OT Long Term Goals -        OT LONG TERM GOAL #1   Title Pt will increase FOTO score to 55 or better to indicate perceived significant  functional performance with ADLs.    Baseline Eval: FOTO 43    Time 12    Period Weeks    Status New    Target Date 10/26/21      OT LONG TERM GOAL #2   Title Pt will increase R grip strength by 10 or more lbs for improving ability to hold and carry ADL supplies in R dominant hand.    Baseline Eval: R grip 35 (L 69)    Time 12    Period Weeks    Status New    Target Date 10/26/21      OT LONG TERM GOAL #3   Title Pt will increase R hand dexterity to independently manipulate eating utensils for self feeding with dominant hand.    Baseline Eval: using L non-dominant hand to eat, unable to cut food.    Time 12    Period Weeks  Status New    Target Date 10/26/21      OT LONG TERM GOAL #4   Title Pt will increase R lateral pinch strength by 5 or more lbs to enable use of R hand to hold toothbrush.    Baseline Eval: R lateral pinch 3#; uses L hand to brush teeth    Time 12    Period Weeks    Status New    Target Date 10/26/21              Plan -     Clinical Impression Statement Spouse reports pt is choosing to use his R dominant hand for self feeding now.  Pt states he can't quite use his chop sticks, but is eager to return to using them.  OT encouraged pt bring chopsticks in to practice during a therapy session next week.  Good tolerance to all therapeutic exercises and activities today.  Pt continues to present with mild apraxia and ataxia in distal RUE.  Pt continues to develop strength and coordination in R hand, and will continue to benefit from skilled OT to increase strength and dexterity for better efficiency when engaging R hand into daily tasks.    OT Occupational Profile and History Problem Focused Assessment - Including review of records relating to presenting problem    Occupational performance deficits (Please refer to evaluation for details): ADL's;IADL's;Leisure    Body Structure / Function / Physical Skills ADL;GMC;UE functional  use;Balance;Endurance;FMC;Strength;Coordination;Dexterity;IADL;Gait;Sensation    Cognitive Skills Memory;Attention;Energy/Drive    Rehab Potential Excellent    Clinical Decision Making Several treatment options, min-mod task modification necessary    Comorbidities Affecting Occupational Performance: May have comorbidities impacting occupational performance    Modification or Assistance to Complete Evaluation  Min-Moderate modification of tasks or assist with assess necessary to complete eval    OT Frequency 2x / week    OT Duration 12 weeks    OT Treatment/Interventions Self-care/ADL training;Therapeutic exercise;Energy conservation;Therapist, nutritional;Therapeutic activities;Balance training;Moist Heat;Neuromuscular education;DME and/or AE instruction;Manual Therapy;Passive range of motion;Patient/family education    Recommended Other Services SLP to eval and tx for cognitive decline post CVA    Consulted and Agree with Plan of Care Patient;Family member/caregiver    Family Member Consulted Spouse, Rella Larve, MS, OTR/L  Darleene Cleaver, OT 08/24/2021, 9:42 AM

## 2021-08-24 NOTE — Therapy (Signed)
OUTPATIENT PHYSICAL THERAPY TREATMENT NOTE   Patient Name: Vernon Payne. MRN: [id number] DOB:Jun 29, 1950, 71 y.o., male Today's Date: 08/24/2021  PCP:  Dr. Emily Filbert REFERRING PROVIDER: Dr. Emily Filbert   PT End of Session - 08/24/21 0804     Visit Number 5    Number of Visits 25    Date for PT Re-Evaluation 10/27/21    Authorization Type [employer]    Authorization Time Period 08/04/2021- 10/27/2021    Progress Note Due on Visit 10    PT Start Time 0802    PT Stop Time 0844    PT Time Calculation (min) 42 min    Equipment Utilized During Treatment Gait belt    Activity Tolerance Patient tolerated treatment well;No increased pain    Behavior During Therapy WFL for tasks assessed/performed             Past Medical History:  Diagnosis Date   Actinic keratosis    Arthritis    Diabetes mellitus without complication (HCC)    GERD (gastroesophageal reflux disease)    Hard of hearing    left hearing aid   HLD (hyperlipidemia)    HTN (hypertension)    Hypotension, postural    IBS (irritable bowel syndrome)    Sleep apnea    Squamous cell carcinoma of skin 01/20/2010   Right chest. Well differentiated. Excised: 01/27/2010   Squamous cell carcinoma of skin 07/15/2019   R preauricular ant to lobe   Wears dentures    partial upper, implants on bottom   Past Surgical History:  Procedure Laterality Date   APPENDECTOMY     BACK SURGERY     lumbar   CATARACT EXTRACTION W/PHACO Right 08/29/2017   Procedure: CATARACT EXTRACTION PHACO AND INTRAOCULAR LENS PLACEMENT ([place]) RIGHT DIABETIC;  Surgeon: Leandrew Koyanagi, MD;  Location: [place];  Service: Ophthalmology;  Laterality: Right;  ISTENT INJECT   CATARACT EXTRACTION W/PHACO Left 11/21/2017   Procedure: CATARACT EXTRACTION PHACO AND INTRAOCULAR LENS PLACEMENT ([facility]);  Surgeon: Leandrew Koyanagi, MD;  Location: [place];  Service: Ophthalmology;  Laterality: Left;   EYE SURGERY Left 11/21/2017    cataract excision   INSERTION OF ANTERIOR SEGMENT AQUEOUS DRAINAGE DEVICE (ISTENT) Right 08/29/2017   Procedure: (ISTENT);  Surgeon: Leandrew Koyanagi, MD;  Location: [place];  Service: Ophthalmology;  Laterality: Right;  Diabetic - oral meds   INSERTION OF ANTERIOR SEGMENT AQUEOUS DRAINAGE DEVICE (ISTENT) Left 11/21/2017   Procedure: INSERTION OF ANTERIOR SEGMENT AQUEOUS DRAINAGE DEVICE ([place])  INJECT DIABETES ISTENT INJECT;  Surgeon: Leandrew Koyanagi, MD;  Location: [place];  Service: Ophthalmology;  Laterality: Left;  Diabetic - oral meds   JOINT REPLACEMENT     left knee replacement   KYPHOPLASTY N/A 03/28/2019   Procedure: L1 KYPHOPLASTY;  Surgeon: Hessie Knows, MD;  Location: [facility];  Service: Orthopedics;  Laterality: N/A;   LASIK     ROTATOR CUFF REPAIR     TONSILLECTOMY     Patient Active Problem List   Diagnosis Date Noted   Acute CVA (cerebrovascular accident) ([place])    Right sided weakness 07/20/2021   Primary osteoarthritis of right knee 09/04/2017   Primary osteoarthritis of left knee 09/04/2017   Status post left partial knee replacement 09/04/2017   Yellow jacket sting 10/31/2016   HTN (hypertension) 10/31/2016   Diabetes ([place]) 10/31/2016   HLD (hyperlipidemia) 10/31/2016    REFERRING DIAG: I63.9 (ICD-10-CM) - CVA (cerebral vascular accident) ([place])  THERAPY DIAG:  Muscle weakness (  generalized)  Other lack of coordination  Difficulty in walking, not elsewhere classified  Abnormality of gait and mobility  Other abnormalities of gait and mobility  Unsteadiness on feet  Rationale for Evaluation and Treatment Rehabilitation  PERTINENT HISTORY: Patient presents post CVA on 07/20/2021. 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 PMH: Chickenpox   Diabetes (CMS-HCC)   Diabetes mellitus type 2, controlled, with complications (CMS-HCC) 2/87/8676   Glaucoma   Hemorrhoids   Hyperlipidemia   Hypertension   OSA  on CPAP 01/24/2017   12/18, AHI 6, RDI 28, no hypoxia   Osteoarthritis   PRECAUTIONS: fall  SUBJECTIVE:  Patient reports didn't do much for 4th of July- Went to [employer] and walked around some. Reports some unsteadiness with walking but denies falls.  PAIN:  Are you having pain? Yes: NPRS scale: 4/10 Pain location: B knee Pain description: ache Aggravating factors: standing/walking  Relieving factors: rest     TODAY'S TREATMENT:   Neuromuscular re-ed: -Step tap onto 6" step without UE support x 15 reps -Step up onto 6" block without UE support x 10 reps   -Calf raises with Toe box on 1/2/foam- VC to hold for 2 sec x 15 reps - Side step with mini lat squat- onto 1/2 foam x 15 reps -Side step over 1/2 foam x 15 reps each leg -forward/retro step over 1/2 foam x 15 reps each LE - Varying foot position stand on airex pad while playing word games on dry erase board incorporating hand/eye coordination; same side/opp/cross body reaching:  -Feet wide  -Feet narrowed  -Feet staggered  -Feet tandem - Played 3 games approx 15 min of working in varying stances. Intermittent LOB with CGA to min assist to recover.   PATIENT EDUCATION: Education details: Exercise techniques Person educated: Patient Education method: Explanation, Demonstration, Tactile cues, and Verbal cues Education comprehension: verbalized understanding, returned demonstration, verbal cues required, tactile cues required, and needs further education   HOME EXERCISE PROGRAM: No updates this visit   PT Short Term Goals -       PT SHORT TERM GOAL #1   Title Pt will be independent with HEP in order to improve strength and balance in order to decrease fall risk and improve function at home and work.    Baseline 08/04/2021= Patient presents with no formal HEP in place    Time 6    Period Weeks    Status New    Target Date 09/15/21              PT Long Term Goals -      PT LONG TERM GOAL #1   Title Pt  will improve FOTO to target score of 65 to display perceived improvements in ability to complete ADL's.    Baseline 08/04/2021= 48    Time 12    Period Weeks    Status New    Target Date 10/27/21      PT LONG TERM GOAL #2   Title Pt will improve BERG by at least 3 points in order to demonstrate clinically significant improvement in balance.    Baseline 08/04/2021= 48    Time 12    Period Weeks    Status New    Target Date 10/27/21      PT LONG TERM GOAL #3   Title Pt will decrease 5TSTS by at least 3 seconds in order to demonstrate clinically significant improvement in LE strength.    Baseline 6/15= 26.0 sec  with BUE Support    Time 12    Period Weeks    Status New    Target Date 10/27/21      PT LONG TERM GOAL #4   Title Pt will decrease TUG to below 15 seconds/decrease in order to demonstrate decreased fall risk.    Baseline 08/04/2021= 19.37 sec without an AD    Time 12    Period Weeks    Status New    Target Date 10/27/21      PT LONG TERM GOAL #5   Title Pt will increase 10MWT to > 1.0 m/s in order to demonstrate clinically significant improvement in community ambulation.    Baseline 08/04/2021= 0.95 m/s    Time 12    Period Weeks    Status New    Target Date 10/27/21              Plan - 08/24/21 1422     Clinical Impression Statement Patient continues to progress with overall balance- able to progress his foot positon to challenge his balance with higher level activities. He continues to have some limitations with knee pain yet able to modify to continue with activities. He was competitive with gaming today and performed better with practice.    Personal Factors and Comorbidities Comorbidity 3+    Comorbidities DM, HTN, OA    Examination-Activity Limitations Carry;Lift;Squat;Stairs    Examination-Participation Restrictions Community Activity;Yard Work    Merchant navy officer Evolving/Moderate complexity    Rehab Potential Good    PT Frequency 2x /  week    PT Duration 12 weeks    PT Treatment/Interventions ADLs/Self Care Home Management;Canalith Repostioning;Cryotherapy;Electrical Stimulation;Moist Heat;DME Instruction;Gait training;Stair training;Functional mobility training;Therapeutic activities;Therapeutic exercise;Balance training;Neuromuscular re-education;Patient/family education;Manual techniques;Passive range of motion;Dry needling;Vestibular    PT Next Visit Plan Instruct in balance and LE strengthening exercises.    PT Home Exercise Plan 08/08/2021=Access Code: [email or web address]  URL: [email or web address]    Consulted and Agree with Plan of Care Patient;Family member/caregiver                Lewis Moccasin, PT 08/24/2021, 2:25 PM

## 2021-08-26 ENCOUNTER — Ambulatory Visit: Payer: Medicare PPO | Admitting: Speech Pathology

## 2021-08-26 DIAGNOSIS — R471 Dysarthria and anarthria: Secondary | ICD-10-CM

## 2021-08-26 DIAGNOSIS — I639 Cerebral infarction, unspecified: Secondary | ICD-10-CM

## 2021-08-26 DIAGNOSIS — M6281 Muscle weakness (generalized): Secondary | ICD-10-CM | POA: Diagnosis not present

## 2021-08-27 NOTE — Therapy (Signed)
OUTPATIENT SPEECH LANGUAGE PATHOLOGY EVALUATION   Patient Name: Vernon Payne. MRN: [id number] DOB:08/01/1950, 71 y.o., male Today's Date: 08/27/2021  PCP: Emily Filbert, MD REFERRING PROVIDER: Emily Filbert, MD   End of Session - 08/27/21 1248     Visit Number 2    Number of Visits 25    Date for SLP Re-Evaluation 11/10/21    Authorization Type [employer] Choice PPO    SLP Start Time 75    SLP Stop Time  1200    SLP Time Calculation (min) 60 min             Past Medical History:  Diagnosis Date   Actinic keratosis    Arthritis    Diabetes mellitus without complication (HCC)    GERD (gastroesophageal reflux disease)    Hard of hearing    left hearing aid   HLD (hyperlipidemia)    HTN (hypertension)    Hypotension, postural    IBS (irritable bowel syndrome)    Sleep apnea    Squamous cell carcinoma of skin 01/20/2010   Right chest. Well differentiated. Excised: 01/27/2010   Squamous cell carcinoma of skin 07/15/2019   R preauricular ant to lobe   Wears dentures    partial upper, implants on bottom   Past Surgical History:  Procedure Laterality Date   APPENDECTOMY     BACK SURGERY     lumbar   CATARACT EXTRACTION W/PHACO Right 08/29/2017   Procedure: CATARACT EXTRACTION PHACO AND INTRAOCULAR LENS PLACEMENT ([place]) RIGHT DIABETIC;  Surgeon: Leandrew Koyanagi, MD;  Location: [place];  Service: Ophthalmology;  Laterality: Right;  ISTENT INJECT   CATARACT EXTRACTION W/PHACO Left 11/21/2017   Procedure: CATARACT EXTRACTION PHACO AND INTRAOCULAR LENS PLACEMENT ([facility]);  Surgeon: Leandrew Koyanagi, MD;  Location: [place];  Service: Ophthalmology;  Laterality: Left;   EYE SURGERY Left 11/21/2017   cataract excision   INSERTION OF ANTERIOR SEGMENT AQUEOUS DRAINAGE DEVICE (ISTENT) Right 08/29/2017   Procedure: (ISTENT);  Surgeon: Leandrew Koyanagi, MD;  Location: [place];  Service: Ophthalmology;  Laterality: Right;  Diabetic -  oral meds   INSERTION OF ANTERIOR SEGMENT AQUEOUS DRAINAGE DEVICE (ISTENT) Left 11/21/2017   Procedure: INSERTION OF ANTERIOR SEGMENT AQUEOUS DRAINAGE DEVICE ([place])  INJECT DIABETES ISTENT INJECT;  Surgeon: Leandrew Koyanagi, MD;  Location: [place];  Service: Ophthalmology;  Laterality: Left;  Diabetic - oral meds   JOINT REPLACEMENT     left knee replacement   KYPHOPLASTY N/A 03/28/2019   Procedure: L1 KYPHOPLASTY;  Surgeon: Hessie Knows, MD;  Location: [facility];  Service: Orthopedics;  Laterality: N/A;   LASIK     ROTATOR CUFF REPAIR     TONSILLECTOMY     Patient Active Problem List   Diagnosis Date Noted   Acute CVA (cerebrovascular accident) ([place])    Right sided weakness 07/20/2021   Primary osteoarthritis of right knee 09/04/2017   Primary osteoarthritis of left knee 09/04/2017   Status post left partial knee replacement 09/04/2017   Yellow jacket sting 10/31/2016   HTN (hypertension) 10/31/2016   Diabetes ([place]) 10/31/2016   HLD (hyperlipidemia) 10/31/2016    ONSET DATE: 07/20/2021   REFERRING DIAG:  J00.938 (ICD-10-CM) - Cognitive communication deficit  R13.10 (ICD-10-CM) - Dysphagia  I63.9 (ICD-10-CM) - CVA (cerebral vascular accident) ([place])    THERAPY DIAG:  Dysarthria and anarthria  Acute CVA (cerebrovascular accident) ([place])  Rationale for Evaluation and Treatment Rehabilitation  SUBJECTIVE:   SUBJECTIVE STATEMENT: Pt pleasant, conversant, joking, more engaged  Pt accompanied by: self and significant other  PERTINENT HISTORY: HTN, HLD  PAIN:  Are you having pain? No   FALLS: Has patient fallen in last 6 months?  No  LIVING ENVIRONMENT: Lives with: lives with their spouse Lives in: House/apartment  PLOF:  Level of assistance: Independent with ADLs, Independent with IADLs Employment: Full-time employment   PATIENT GOALS "not to be so tired all of the time"  OBJECTIVE:   DIAGNOSTIC FINDINGS:   MRI - 07/20/2021 Small acute  infarct of the left corona radiata extending toward the lentiform nucleus.  Mild chronic microvascular ischemic changes.    PATIENT REPORTED OUTCOME MEASURES (PROM): The Communication Effectiveness Survey is a patient-reported outcome measure in which the patient rates their own effectiveness in different communication situations. A higher score indicates greater effectiveness. Pt's self-rating was 11/32.    TODAY'S TREATMENT SESSION: Skilled treatment session focused on pt's speech intelligibility goals. SLP facilitated session by providing pt with conversational topics. Pt was Mod I for self-monitoring, self-correcting and use of speech intelligibility strategies to achieve > 95% speech intelligibility at the complex conversation level.    PATIENT EDUCATION: Education details: results of this assessment, ST POC Person educated: Patient and Spouse Education method: Explanation Education comprehension: verbalized understanding and needs further education     GOALS: Goals reviewed with patient? Yes  SHORT TERM GOALS: Target date: 10 sessions  Pt will use speech intelligibility strategies to achieve > 95% speech intelligibility at the simple conversation level.  Baseline: imprecise articulation Goal status: INITIAL  2.  Pt will completed instrumental swallow assessment.  Baseline: to be scheduled Goal status: INITIAL   LONG TERM GOALS: Target date: 11/09/2021   Pt will utilize speech intelligibility strategies to achieve > 95% intelligibility at the complex conversation level to communicate abstract concepts/thoughts Baseline: < 80% Goal status: INITIAL  2.  Pt will consume least restrictive diet with minimal overt s/s of dysphagia and aspiration.  Baseline: intermittent s/s  Goal status: INITIAL   ASSESSMENT:  CLINICAL IMPRESSION: Pt presents with improve speech intelligibility d/t Mod I use of speech intelligibility strategies, specifically slow rate.   OBJECTIVE  IMPAIRMENTS include dysarthria and dysphagia. These impairments are limiting patient from return to work, ADLs/IADLs, effectively communicating at home and in community, and safety when swallowing. Factors affecting potential to achieve goals and functional outcome are  flat affect, fatigue, "my head feels heavy ". Patient will benefit from skilled SLP services to address above impairments and improve overall function.  REHAB POTENTIAL: Excellent  PLAN: SLP FREQUENCY: 1-2x/week  SLP DURATION: 12 weeks  PLANNED INTERVENTIONS: Diet toleration management , Functional tasks, SLP instruction and feedback, Compensatory strategies, and Patient/family education    Nadiyah Zeis B. Rutherford Nail, M.S., CCC-SLP, Mining engineer Certified Brain Injury [facility]  [place] Office [phone] Ascom [phone] Fax [phone]

## 2021-08-30 ENCOUNTER — Encounter: Payer: Medicare PPO | Admitting: Occupational Therapy

## 2021-08-30 ENCOUNTER — Encounter: Payer: Medicare PPO | Admitting: Speech Pathology

## 2021-08-31 ENCOUNTER — Ambulatory Visit: Payer: Medicare PPO

## 2021-08-31 ENCOUNTER — Ambulatory Visit: Payer: Medicare PPO | Admitting: Physical Therapy

## 2021-08-31 ENCOUNTER — Encounter: Payer: Self-pay | Admitting: Physical Therapy

## 2021-08-31 DIAGNOSIS — R2681 Unsteadiness on feet: Secondary | ICD-10-CM

## 2021-08-31 DIAGNOSIS — R269 Unspecified abnormalities of gait and mobility: Secondary | ICD-10-CM

## 2021-08-31 DIAGNOSIS — M6281 Muscle weakness (generalized): Secondary | ICD-10-CM

## 2021-08-31 DIAGNOSIS — R2689 Other abnormalities of gait and mobility: Secondary | ICD-10-CM

## 2021-08-31 DIAGNOSIS — R262 Difficulty in walking, not elsewhere classified: Secondary | ICD-10-CM

## 2021-08-31 DIAGNOSIS — R278 Other lack of coordination: Secondary | ICD-10-CM

## 2021-08-31 DIAGNOSIS — I639 Cerebral infarction, unspecified: Secondary | ICD-10-CM

## 2021-08-31 NOTE — Therapy (Signed)
OUTPATIENT OCCUPATIONAL THERAPY TREATMENT NOTE   Patient Name: Vernon Payne. MRN: [id number] DOB:01-Apr-1950, 71 y.o., male Today's Date: 08/31/2021   REFERRING PROVIDER: Emily Filbert, MD   OT End of Session - 08/31/21 1441     Visit Number 6    Number of Visits 24    Date for OT Re-Evaluation 10/26/21    Authorization Time Period Reporting period beginning 08/04/21    OT Start Time 1432    OT Stop Time 1515    OT Time Calculation (min) 43 min    Activity Tolerance Patient tolerated treatment well    Behavior During Therapy WFL for tasks assessed/performed             Past Medical History:  Diagnosis Date   Actinic keratosis    Arthritis    Diabetes mellitus without complication (HCC)    GERD (gastroesophageal reflux disease)    Hard of hearing    left hearing aid   HLD (hyperlipidemia)    HTN (hypertension)    Hypotension, postural    IBS (irritable bowel syndrome)    Sleep apnea    Squamous cell carcinoma of skin 01/20/2010   Right chest. Well differentiated. Excised: 01/27/2010   Squamous cell carcinoma of skin 07/15/2019   R preauricular ant to lobe   Wears dentures    partial upper, implants on bottom   Past Surgical History:  Procedure Laterality Date   APPENDECTOMY     BACK SURGERY     lumbar   CATARACT EXTRACTION W/PHACO Right 08/29/2017   Procedure: CATARACT EXTRACTION PHACO AND INTRAOCULAR LENS PLACEMENT ([place]) RIGHT DIABETIC;  Surgeon: Leandrew Koyanagi, MD;  Location: [place];  Service: Ophthalmology;  Laterality: Right;  ISTENT INJECT   CATARACT EXTRACTION W/PHACO Left 11/21/2017   Procedure: CATARACT EXTRACTION PHACO AND INTRAOCULAR LENS PLACEMENT (IOC);  Surgeon: Leandrew Koyanagi, MD;  Location: [place];  Service: Ophthalmology;  Laterality: Left;   EYE SURGERY Left 11/21/2017   cataract excision   INSERTION OF ANTERIOR SEGMENT AQUEOUS DRAINAGE DEVICE (ISTENT) Right 08/29/2017   Procedure: (ISTENT);  Surgeon:  Leandrew Koyanagi, MD;  Location: [place];  Service: Ophthalmology;  Laterality: Right;  Diabetic - oral meds   INSERTION OF ANTERIOR SEGMENT AQUEOUS DRAINAGE DEVICE (ISTENT) Left 11/21/2017   Procedure: INSERTION OF ANTERIOR SEGMENT AQUEOUS DRAINAGE DEVICE ([place])  INJECT DIABETES ISTENT INJECT;  Surgeon: Leandrew Koyanagi, MD;  Location: [place];  Service: Ophthalmology;  Laterality: Left;  Diabetic - oral meds   JOINT REPLACEMENT     left knee replacement   KYPHOPLASTY N/A 03/28/2019   Procedure: L1 KYPHOPLASTY;  Surgeon: Hessie Knows, MD;  Location: [facility];  Service: Orthopedics;  Laterality: N/A;   LASIK     ROTATOR CUFF REPAIR     TONSILLECTOMY     Patient Active Problem List   Diagnosis Date Noted   Acute CVA (cerebrovascular accident) ([place])    Right sided weakness 07/20/2021   Primary osteoarthritis of right knee 09/04/2017   Primary osteoarthritis of left knee 09/04/2017   Status post left partial knee replacement 09/04/2017   Yellow jacket sting 10/31/2016   HTN (hypertension) 10/31/2016   Diabetes ([place]) 10/31/2016   HLD (hyperlipidemia) 10/31/2016     REFERRING DIAG: CVA  THERAPY DIAG:  Muscle weakness (generalized)  Other lack of coordination  Acute CVA (cerebrovascular accident) ([place])  Rationale for Evaluation and Treatment Rehabilitation  PERTINENT HISTORY: Vernon Payne. is a 71 y.o. male with medical history significant  of hypertension, diabetes, hyperlipidemia, sleep apnea comes to the emergency room after he started noticing right-sided weakness after he went to work. He started noticing decreased grip of his pen while writing and had on occasion slurred speech. He started filling heaviness on the right side called his wife and was brought to the emergency room.  PRECAUTIONS: None  SUBJECTIVE: Pt reports he wants to eat sushi but gets frustrated when he drops it using chopsticks. Pt cites pain/fatigue from moving boxes all  day stating "I went up and down 12 flights."  PAIN:  Are you having pain? Yes: NPRS scale: 5/10", Pain location: bilateral knees, Pain Description: sore     OBJECTIVE:   TODAY'S TREATMENT:   08/31/21 Therapeutic Exercise Pt performed hand strengthening with blue theraputty for gross grip strengthening. Reports currently prefers green for home HEP. Pt worked on [employer] in the right hand for lateral, and 3pt. pinch using green and blue resistive clips, attempted black clips using palmar grasp. Pt completed 7.5lb digi-flex for finger strengthening, cues for digit isolation.  Self Care Pt practiced self-feeding using chopsticks to pick up 20 checkers from table top and place in container at chin height. Increased difficulty as pt fatigues and becomes frustrated however completes task with encouragement.   Neuromuscular Re-education Pt focused on improving [facility] with manipulating nuts and bolts positioned vertically with vision occluded to R hand. Completed unthreading and replacing 1", 1/2", 1/4", and 1/8" nuts/bolts. Pt required increased time and cues to utilize R hand during task.    08/24/21 Therapeutic Exercise: Facilitated hand strengthening with use of hand gripper set at 17.9# for 3 trials to remove jumbo pegs from pegboard using R hand.  Pegboard placed on an incline at table top level to engage R shoulder with forward reaching.  Pt discarded pegs into container placed at shoulder level to facilitate forward, contralateral, and ipsilateral reaching.  Reviewed/completed digit extension stretching to target MCP and PIP joints where pt continues to report stiffness.  Min vc required for technique and holding stretch for 15-20 sec on each digit.   Neuro re-ed: Facilitated R hand dexterity molding small pieces of green putty into a small ball.  Cues for technique to move putty pieces from palm to fingertips.  Extra time needed for transitioning putty within hand to fingertips.    Self Care: Practiced cutting skills with table knife and fork and green theraputty, using R hand for sawing motion with the knife.  Practiced holding fork in R hand to stab small pieces of food (putty) with fairly good accuracy, mild ataxia noted.    PATIENT EDUCATION: Education details: HEP progression; include molding putty into small balls with R hand and practicing with fork/knife with putty Person educated: Patient and Spouse Education method: vc, demo Education comprehension: verbalized understanding, returned demonstration, verbal cues required, and needs further education   [place] for strengthening and dexterity in the R hand.   OT Short Term Goals -        OT SHORT TERM GOAL #1   Title Pt will be indep to perform HEP for improving RUE strength and coordination.    Baseline Eval: initiated, will conitnue to progress    Time 6    Period Weeks    Status New    Target Date 09/14/21              OT Long Term Goals -        OT LONG TERM GOAL #1  Title Pt will increase FOTO score to 55 or better to indicate perceived significant functional performance with ADLs.    Baseline Eval: FOTO 43    Time 12    Period Weeks    Status New    Target Date 10/26/21      OT LONG TERM GOAL #2   Title Pt will increase R grip strength by 10 or more lbs for improving ability to hold and carry ADL supplies in R dominant hand.    Baseline Eval: R grip 35 (L 69)    Time 12    Period Weeks    Status New    Target Date 10/26/21      OT LONG TERM GOAL #3   Title Pt will increase R hand dexterity to independently manipulate eating utensils for self feeding with dominant hand.    Baseline Eval: using L non-dominant hand to eat, unable to cut food.    Time 12    Period Weeks    Status New    Target Date 10/26/21      OT LONG TERM GOAL #4   Title Pt will increase R lateral pinch strength by 5 or more lbs to enable use of R hand to hold toothbrush.     Baseline Eval: R lateral pinch 3#; uses L hand to brush teeth    Time 12    Period Weeks    Status New    Target Date 10/26/21              Plan -     Clinical Impression Statement Pt brought in chopsticks and was eager to practice, simulated eating sushi with use of checkers with fair frustration tolerance. Pt continues to present with mild apraxia and ataxia in distal RUE.  Pt continues to develop strength and coordination in R hand, and will continue to benefit from skilled OT to increase strength and dexterity for better efficiency when engaging R hand into daily tasks.    OT Occupational Profile and History Problem Focused Assessment - Including review of records relating to presenting problem    Occupational performance deficits (Please refer to evaluation for details): ADL's;IADL's;Leisure    Body Structure / Function / Physical Skills ADL;GMC;UE functional use;Balance;Endurance;FMC;Strength;Coordination;Dexterity;IADL;Gait;Sensation    Cognitive Skills Memory;Attention;Energy/Drive    Rehab Potential Excellent    Clinical Decision Making Several treatment options, min-mod task modification necessary    Comorbidities Affecting Occupational Performance: May have comorbidities impacting occupational performance    Modification or Assistance to Complete Evaluation  Min-Moderate modification of tasks or assist with assess necessary to complete eval    OT Frequency 2x / week    OT Duration 12 weeks    OT Treatment/Interventions Self-care/ADL training;Therapeutic exercise;Energy conservation;Therapist, nutritional;Therapeutic activities;Balance training;Moist Heat;Neuromuscular education;DME and/or AE instruction;Manual Therapy;Passive range of motion;Patient/family education    Recommended Other Services SLP to eval and tx for cognitive decline post CVA    Consulted and Agree with Plan of Care Patient;Family member/caregiver    Family Member Consulted Spouse, Leslie Andrea, M.S. OTR/L  08/31/21, 3:20 PM  ascom [phone]   Leonides Cave, OT 08/31/2021, 3:20 PM

## 2021-08-31 NOTE — Therapy (Signed)
OUTPATIENT PHYSICAL THERAPY TREATMENT NOTE   Patient Name: Vernon Payne. MRN: [id number] DOB:1950/02/25, 71 y.o., male Today's Date: 08/31/2021  PCP:  Dr. Emily Filbert REFERRING PROVIDER: Dr. Emily Filbert   PT End of Session - 08/31/21 1517     Visit Number 6    Number of Visits 25    Date for PT Re-Evaluation 10/27/21    Authorization Type [employer]    Authorization Time Period 08/04/2021- 10/27/2021    Progress Note Due on Visit 10    PT Start Time 1515    PT Stop Time 1600    PT Time Calculation (min) 45 min    Equipment Utilized During Treatment Gait belt    Activity Tolerance Patient tolerated treatment well;No increased pain    Behavior During Therapy WFL for tasks assessed/performed              Past Medical History:  Diagnosis Date   Actinic keratosis    Arthritis    Diabetes mellitus without complication (HCC)    GERD (gastroesophageal reflux disease)    Hard of hearing    left hearing aid   HLD (hyperlipidemia)    HTN (hypertension)    Hypotension, postural    IBS (irritable bowel syndrome)    Sleep apnea    Squamous cell carcinoma of skin 01/20/2010   Right chest. Well differentiated. Excised: 01/27/2010   Squamous cell carcinoma of skin 07/15/2019   R preauricular ant to lobe   Wears dentures    partial upper, implants on bottom   Past Surgical History:  Procedure Laterality Date   APPENDECTOMY     BACK SURGERY     lumbar   CATARACT EXTRACTION W/PHACO Right 08/29/2017   Procedure: CATARACT EXTRACTION PHACO AND INTRAOCULAR LENS PLACEMENT ([place]) RIGHT DIABETIC;  Surgeon: Leandrew Koyanagi, MD;  Location: [place];  Service: Ophthalmology;  Laterality: Right;  ISTENT INJECT   CATARACT EXTRACTION W/PHACO Left 11/21/2017   Procedure: CATARACT EXTRACTION PHACO AND INTRAOCULAR LENS PLACEMENT ([facility]);  Surgeon: Leandrew Koyanagi, MD;  Location: [place];  Service: Ophthalmology;  Laterality: Left;   EYE SURGERY Left  11/21/2017   cataract excision   INSERTION OF ANTERIOR SEGMENT AQUEOUS DRAINAGE DEVICE (ISTENT) Right 08/29/2017   Procedure: (ISTENT);  Surgeon: Leandrew Koyanagi, MD;  Location: [place];  Service: Ophthalmology;  Laterality: Right;  Diabetic - oral meds   INSERTION OF ANTERIOR SEGMENT AQUEOUS DRAINAGE DEVICE (ISTENT) Left 11/21/2017   Procedure: INSERTION OF ANTERIOR SEGMENT AQUEOUS DRAINAGE DEVICE ([place])  INJECT DIABETES ISTENT INJECT;  Surgeon: Leandrew Koyanagi, MD;  Location: [place];  Service: Ophthalmology;  Laterality: Left;  Diabetic - oral meds   JOINT REPLACEMENT     left knee replacement   KYPHOPLASTY N/A 03/28/2019   Procedure: L1 KYPHOPLASTY;  Surgeon: Hessie Knows, MD;  Location: [facility];  Service: Orthopedics;  Laterality: N/A;   LASIK     ROTATOR CUFF REPAIR     TONSILLECTOMY     Patient Active Problem List   Diagnosis Date Noted   Acute CVA (cerebrovascular accident) ([place])    Right sided weakness 07/20/2021   Primary osteoarthritis of right knee 09/04/2017   Primary osteoarthritis of left knee 09/04/2017   Status post left partial knee replacement 09/04/2017   Yellow jacket sting 10/31/2016   HTN (hypertension) 10/31/2016   Diabetes ([place]) 10/31/2016   HLD (hyperlipidemia) 10/31/2016    REFERRING DIAG: I63.9 (ICD-10-CM) - CVA (cerebral vascular accident) ([place])  THERAPY DIAG:  Difficulty  in walking, not elsewhere classified  Abnormality of gait and mobility  Other abnormalities of gait and mobility  Unsteadiness on feet  Muscle weakness (generalized)  Rationale for Evaluation and Treatment Rehabilitation  PERTINENT HISTORY: Patient presents post CVA on 07/20/2021. 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 PMH: Chickenpox   Diabetes (CMS-HCC)   Diabetes mellitus type 2, controlled, with complications (CMS-HCC) 6/44/0347   Glaucoma   Hemorrhoids   Hyperlipidemia   Hypertension   OSA on CPAP 01/24/2017    12/18, AHI 6, RDI 28, no hypoxia   Osteoarthritis   PRECAUTIONS: fall  SUBJECTIVE:  Patient reports didn't do much for 4th of July- Went to [employer] and walked around some. Reports some unsteadiness with walking but denies falls.  PAIN:  Are you having pain? Yes: NPRS scale: 4/10 Pain location: B knee Pain description: ache Aggravating factors: standing/walking  Relieving factors: rest     TODAY'S TREATMENT:  Exercise/Activity Sets/ Reps/Time/ Resistance Assistance Charge type Comments        STS  2 X 5  Second set with airex under feet   TE Significant hip flexion with causing pt to have lightheadedness. With cues for altering pt declines th change form. Worked on part to hole completion in mini squats with UE support, UE support on every rep   Seated calf raises with Toe box on 1/2/foam X 2 x 15   TE   Forward/ retro step to airex   *10 R LE leading   Neuro re-ed First few reps UE A unilateral and following no UE support required. Occasional LOB corrected via pt righting reaction.   Step over 1/2 foam roller with 3 # AW and practice with proper cane sequencing  X 10 R LE  X 5 laps through set of 2 with cane  Neuro re-ed With cane pt provided cues for proper sequence ( was stepping with same foot/ same side of cane)   Lateral steps on and off of airex  X 10 ea side   NMR First few reps UE A unilateral and following no UE support required. Occasional LOB corrected via pt righting reaction  Mini squats with UE support  2 x 10   TE Cues for hip hinge but not excessive such as in sit to stand.                           Treatment provided this session   Pt educated throughout session about proper posture and technique with exercises. Improved exercise technique, movement at target joints, use of target muscles after min to mod verbal, visual, tactile cues. Note: Portions of this document were prepared using Dragon voice recognition software and although reviewed may contain  unintentional dictation errors in syntax, grammar, or spelling.    PATIENT EDUCATION: Education details: Exercise techniques Person educated: Patient Education method: Explanation, Demonstration, Tactile cues, and Verbal cues Education comprehension: verbalized understanding, returned demonstration, verbal cues required, tactile cues required, and needs further education   HOME EXERCISE PROGRAM: No updates this visit   PT Short Term Goals -       PT SHORT TERM GOAL #1   Title Pt will be independent with HEP in order to improve strength and balance in order to decrease fall risk and improve function at home and work.    Baseline 08/04/2021= Patient presents with no formal HEP in place    Time 6    Period  Weeks    Status New    Target Date 09/15/21              PT Long Term Goals -      PT LONG TERM GOAL #1   Title Pt will improve FOTO to target score of 65 to display perceived improvements in ability to complete ADL's.    Baseline 08/04/2021= 48    Time 12    Period Weeks    Status New    Target Date 10/27/21      PT LONG TERM GOAL #2   Title Pt will improve BERG by at least 3 points in order to demonstrate clinically significant improvement in balance.    Baseline 08/04/2021= 48    Time 12    Period Weeks    Status New    Target Date 10/27/21      PT LONG TERM GOAL #3   Title Pt will decrease 5TSTS by at least 3 seconds in order to demonstrate clinically significant improvement in LE strength.    Baseline 6/15= 26.0 sec with BUE Support    Time 12    Period Weeks    Status New    Target Date 10/27/21      PT LONG TERM GOAL #4   Title Pt will decrease TUG to below 15 seconds/decrease in order to demonstrate decreased fall risk.    Baseline 08/04/2021= 19.37 sec without an AD    Time 12    Period Weeks    Status New    Target Date 10/27/21      PT LONG TERM GOAL #5   Title Pt will increase 10MWT to > 1.0 m/s in order to demonstrate clinically significant  improvement in community ambulation.    Baseline 08/04/2021= 0.95 m/s    Time 12    Period Weeks    Status New    Target Date 10/27/21             Plan -     Clinical Impression Statement Continued with current plan of care as laid out in evaluation and recent prior sessions. Pt remains motivated to advance progress toward goals in order to maximize independence and safety at home. Pt requires high level assistance and cuing for completion of exercises in order to provide adequate level of stimulation and perturbation. Author allows pt as much opportunity as possible to perform independent righting strategies, only stepping in when pt is unable to prevent falling to floor. Pt continues to demonstrate progress toward goals AEB progression of some interventions this date either in volume or intensity.    Personal Factors and Comorbidities Comorbidity 3+    Comorbidities DM, HTN, OA    Examination-Activity Limitations Carry;Lift;Squat;Stairs    Examination-Participation Restrictions Community Activity;Yard Work    [employer]chant [employer] officer Evolving/Moderate complexity    Rehab Potential Good    PT Frequency 2x / week    PT Duration 12 weeks    PT Treatment/Interventions ADLs/Self Care Home Management;Canalith Repostioning;Cryotherapy;Electrical Stimulation;Moist Heat;DME Instruction;Gait training;Stair training;Functional mobility training;Therapeutic activities;Therapeutic exercise;Balance training;Neuromuscular re-education;Patient/family education;Manual techniques;Passive range of motion;Dry needling;Vestibular    PT Next Visit Plan Instruct in balance and LE strengthening exercises.    PT Home Exercise Plan 08/08/2021=Access Code: [email or web address]  URL: [email or web address]    Consulted and Agree with Plan of Care Patient;Family member/caregiver                  Particia Lather, PT 08/31/2021, 5:05 PM

## 2021-09-01 ENCOUNTER — Ambulatory Visit: Payer: Medicare PPO | Admitting: Speech Pathology

## 2021-09-01 DIAGNOSIS — M6281 Muscle weakness (generalized): Secondary | ICD-10-CM | POA: Diagnosis not present

## 2021-09-01 DIAGNOSIS — R41841 Cognitive communication deficit: Secondary | ICD-10-CM

## 2021-09-01 DIAGNOSIS — R471 Dysarthria and anarthria: Secondary | ICD-10-CM

## 2021-09-01 DIAGNOSIS — I639 Cerebral infarction, unspecified: Secondary | ICD-10-CM

## 2021-09-02 NOTE — Therapy (Signed)
OUTPATIENT SPEECH LANGUAGE PATHOLOGY EVALUATION   Patient Name: Vernon Payne. MRN: [id number] DOB:December 17, 1950, 71 y.o., male Today's Date: 09/02/2021  PCP: Emily Filbert, MD REFERRING PROVIDER: Emily Filbert, MD   End of Session - 09/02/21 1214     Visit Number 3    Number of Visits 25    Date for SLP Re-Evaluation 11/10/21    Authorization Type [employer] Medicare Choice PPO    SLP Start Time 1500    SLP Stop Time  1600    SLP Time Calculation (min) 60 min    Activity Tolerance Patient tolerated treatment well             Past Medical History:  Diagnosis Date   Actinic keratosis    Arthritis    Diabetes mellitus without complication (HCC)    GERD (gastroesophageal reflux disease)    Hard of hearing    left hearing aid   HLD (hyperlipidemia)    HTN (hypertension)    Hypotension, postural    IBS (irritable bowel syndrome)    Sleep apnea    Squamous cell carcinoma of skin 01/20/2010   Right chest. Well differentiated. Excised: 01/27/2010   Squamous cell carcinoma of skin 07/15/2019   R preauricular ant to lobe   Wears dentures    partial upper, implants on bottom   Past Surgical History:  Procedure Laterality Date   APPENDECTOMY     BACK SURGERY     lumbar   CATARACT EXTRACTION W/PHACO Right 08/29/2017   Procedure: CATARACT EXTRACTION PHACO AND INTRAOCULAR LENS PLACEMENT ([place]) RIGHT DIABETIC;  Surgeon: Leandrew Koyanagi, MD;  Location: [facility];  Service: Ophthalmology;  Laterality: Right;  ISTENT INJECT   CATARACT EXTRACTION W/PHACO Left 11/21/2017   Procedure: CATARACT EXTRACTION PHACO AND INTRAOCULAR LENS PLACEMENT (IOC);  Surgeon: Leandrew Koyanagi, MD;  Location: [facility];  Service: Ophthalmology;  Laterality: Left;   EYE SURGERY Left 11/21/2017   cataract excision   INSERTION OF ANTERIOR SEGMENT AQUEOUS DRAINAGE DEVICE (ISTENT) Right 08/29/2017   Procedure: (ISTENT);  Surgeon: Leandrew Koyanagi, MD;  Location: [facility];   Service: Ophthalmology;  Laterality: Right;  Diabetic - oral meds   INSERTION OF ANTERIOR SEGMENT AQUEOUS DRAINAGE DEVICE (ISTENT) Left 11/21/2017   Procedure: INSERTION OF ANTERIOR SEGMENT AQUEOUS DRAINAGE DEVICE ([place])  INJECT DIABETES ISTENT INJECT;  Surgeon: Leandrew Koyanagi, MD;  Location: [facility];  Service: Ophthalmology;  Laterality: Left;  Diabetic - oral meds   JOINT REPLACEMENT     left knee replacement   KYPHOPLASTY N/A 03/28/2019   Procedure: L1 KYPHOPLASTY;  Surgeon: Hessie Knows, MD;  Location: [facility];  Service: Orthopedics;  Laterality: N/A;   LASIK     ROTATOR CUFF REPAIR     TONSILLECTOMY     Patient Active Problem List   Diagnosis Date Noted   Acute CVA (cerebrovascular accident) ([place])    Right sided weakness 07/20/2021   Primary osteoarthritis of right knee 09/04/2017   Primary osteoarthritis of left knee 09/04/2017   Status post left partial knee replacement 09/04/2017   Yellow jacket sting 10/31/2016   HTN (hypertension) 10/31/2016   Diabetes ([place]) 10/31/2016   HLD (hyperlipidemia) 10/31/2016    ONSET DATE: 07/20/2021   REFERRING DIAG:  G18.299 (ICD-10-CM) - Cognitive communication deficit  R13.10 (ICD-10-CM) - Dysphagia  I63.9 (ICD-10-CM) - CVA (cerebral vascular accident) ([place])    THERAPY DIAG:  Cognitive communication deficit  Dysarthria and anarthria  Acute CVA (cerebrovascular accident) ([place])  Rationale for Evaluation and Treatment  Rehabilitation  SUBJECTIVE:   SUBJECTIVE STATEMENT: Pt pleasant, conversant, joking, more engaged; unaccompanied  Pt accompanied by: self and significant other  PERTINENT HISTORY: HTN, HLD  PAIN:  Are you having pain? No   FALLS: Has patient fallen in last 6 months?  No  LIVING ENVIRONMENT: Lives with: lives with their spouse Lives in: House/apartment  PLOF:  Level of assistance: Independent with ADLs, Independent with IADLs Employment: Full-time employment   PATIENT GOALS "not to  be so tired all of the time"  OBJECTIVE:   DIAGNOSTIC FINDINGS:   MRI - 07/20/2021 Small acute infarct of the left corona radiata extending toward the lentiform nucleus.  Mild chronic microvascular ischemic changes.    PATIENT REPORTED OUTCOME MEASURES (PROM): The Communication Effectiveness Survey is a patient-reported outcome measure in which the patient rates their own effectiveness in different communication situations. A higher score indicates greater effectiveness. Pt's self-rating was 11/32.    TODAY'S TREATMENT SESSION: Skilled treatment session focused on pt's speech intelligibility goals. SLP facilitated session by providing pt with complex conversational topics. Pt was Mod I for self-monitoring, self-correcting and use of speech intelligibility strategies to achieve > 95% speech intelligibility at the complex conversation level. Pt also able to provide complex instructions for unfamiliar task to this SLP.   PATIENT EDUCATION: Education details: results of this assessment, ST POC Person educated: Patient and Spouse Education method: Explanation Education comprehension: verbalized understanding and needs further education     GOALS: Goals reviewed with patient? Yes  SHORT TERM GOALS: Target date: 10 sessions  Pt will use speech intelligibility strategies to achieve > 95% speech intelligibility at the simple conversation level.  Baseline: imprecise articulation Goal status: INITIAL  2.  Pt will completed instrumental swallow assessment.  Baseline: to be scheduled Goal status: INITIAL   LONG TERM GOALS: Target date: 11/09/2021   Pt will utilize speech intelligibility strategies to achieve > 95% intelligibility at the complex conversation level to communicate abstract concepts/thoughts Baseline: < 80% Goal status: INITIAL  2.  Pt will consume least restrictive diet with minimal overt s/s of dysphagia and aspiration.  Baseline: intermittent s/s  Goal status:  INITIAL   ASSESSMENT:  CLINICAL IMPRESSION: Pt presents with improve speech intelligibility d/t Mod I use of speech intelligibility strategies, specifically slow rate.   OBJECTIVE IMPAIRMENTS include dysarthria and dysphagia. These impairments are limiting patient from return to work, ADLs/IADLs, effectively communicating at home and in community, and safety when swallowing. Factors affecting potential to achieve goals and functional outcome are  flat affect, fatigue, "my head feels heavy ". Patient will benefit from skilled SLP services to address above impairments and improve overall function.  REHAB POTENTIAL: Excellent  PLAN: SLP FREQUENCY: 1-2x/week  SLP DURATION: 12 weeks  PLANNED INTERVENTIONS: Diet toleration management , Functional tasks, SLP instruction and feedback, Compensatory strategies, and Patient/family education    Wilford Merryfield B. Rutherford Nail, M.S., CCC-SLP, Mining engineer Certified Brain Injury [place]  [place] Office [phone] Ascom [phone] Fax [phone]

## 2021-09-05 ENCOUNTER — Encounter: Payer: Medicare PPO | Admitting: Occupational Therapy

## 2021-09-05 ENCOUNTER — Ambulatory Visit: Payer: Medicare PPO

## 2021-09-07 ENCOUNTER — Encounter: Payer: Medicare PPO | Admitting: Speech Pathology

## 2021-09-07 ENCOUNTER — Ambulatory Visit: Payer: Medicare PPO | Admitting: Physical Therapy

## 2021-09-12 ENCOUNTER — Ambulatory Visit: Payer: Medicare PPO | Admitting: Speech Pathology

## 2021-09-12 ENCOUNTER — Ambulatory Visit: Payer: Medicare PPO | Admitting: Physical Therapy

## 2021-09-14 ENCOUNTER — Encounter: Payer: Medicare PPO | Admitting: Occupational Therapy

## 2021-09-14 ENCOUNTER — Encounter: Payer: Medicare PPO | Admitting: Speech Pathology

## 2021-09-19 ENCOUNTER — Ambulatory Visit: Payer: Medicare PPO | Admitting: Physical Therapy

## 2021-09-19 ENCOUNTER — Ambulatory Visit: Payer: Medicare PPO

## 2021-09-19 ENCOUNTER — Ambulatory Visit: Payer: Medicare PPO | Admitting: Speech Pathology

## 2021-09-19 DIAGNOSIS — R2689 Other abnormalities of gait and mobility: Secondary | ICD-10-CM

## 2021-09-19 DIAGNOSIS — R278 Other lack of coordination: Secondary | ICD-10-CM

## 2021-09-19 DIAGNOSIS — M6281 Muscle weakness (generalized): Secondary | ICD-10-CM | POA: Diagnosis not present

## 2021-09-19 DIAGNOSIS — R2681 Unsteadiness on feet: Secondary | ICD-10-CM

## 2021-09-19 DIAGNOSIS — R269 Unspecified abnormalities of gait and mobility: Secondary | ICD-10-CM

## 2021-09-19 DIAGNOSIS — R262 Difficulty in walking, not elsewhere classified: Secondary | ICD-10-CM

## 2021-09-19 DIAGNOSIS — I639 Cerebral infarction, unspecified: Secondary | ICD-10-CM

## 2021-09-19 DIAGNOSIS — R471 Dysarthria and anarthria: Secondary | ICD-10-CM

## 2021-09-19 NOTE — Therapy (Signed)
OUTPATIENT PHYSICAL THERAPY TREATMENT NOTE   Patient Name: Vernon Payne. MRN: [id number] DOB:11/01/1950, 71 y.o., male Today's Date: 09/19/2021  PCP:  Dr. Emily Filbert REFERRING PROVIDER: Dr. Emily Filbert   PT End of Session - 09/19/21 1059     Visit Number 7    Number of Visits 25    Date for PT Re-Evaluation 10/27/21    Authorization Type [employer] Medicare    Authorization Time Period 08/04/2021- 10/27/2021    Progress Note Due on Visit 10    PT Start Time 1100    PT Stop Time 1143    PT Time Calculation (min) 43 min    Equipment Utilized During Treatment Gait belt    Activity Tolerance Patient tolerated treatment well;No increased pain    Behavior During Therapy WFL for tasks assessed/performed              Past Medical History:  Diagnosis Date   Actinic keratosis    Arthritis    Diabetes mellitus without complication (HCC)    GERD (gastroesophageal reflux disease)    Hard of hearing    left hearing aid   HLD (hyperlipidemia)    HTN (hypertension)    Hypotension, postural    IBS (irritable bowel syndrome)    Sleep apnea    Squamous cell carcinoma of skin 01/20/2010   Right chest. Well differentiated. Excised: 01/27/2010   Squamous cell carcinoma of skin 07/15/2019   R preauricular ant to lobe   Wears dentures    partial upper, implants on bottom   Past Surgical History:  Procedure Laterality Date   APPENDECTOMY     BACK SURGERY     lumbar   CATARACT EXTRACTION W/PHACO Right 08/29/2017   Procedure: CATARACT EXTRACTION PHACO AND INTRAOCULAR LENS PLACEMENT ([place]) RIGHT DIABETIC;  Surgeon: Leandrew Koyanagi, MD;  Location: [facility];  Service: Ophthalmology;  Laterality: Right;  ISTENT INJECT   CATARACT EXTRACTION W/PHACO Left 11/21/2017   Procedure: CATARACT EXTRACTION PHACO AND INTRAOCULAR LENS PLACEMENT ([facility]);  Surgeon: Leandrew Koyanagi, MD;  Location: [facility];  Service: Ophthalmology;  Laterality: Left;   EYE SURGERY Left  11/21/2017   cataract excision   INSERTION OF ANTERIOR SEGMENT AQUEOUS DRAINAGE DEVICE (ISTENT) Right 08/29/2017   Procedure: (ISTENT);  Surgeon: Leandrew Koyanagi, MD;  Location: [facility];  Service: Ophthalmology;  Laterality: Right;  Diabetic - oral meds   INSERTION OF ANTERIOR SEGMENT AQUEOUS DRAINAGE DEVICE (ISTENT) Left 11/21/2017   Procedure: INSERTION OF ANTERIOR SEGMENT AQUEOUS DRAINAGE DEVICE ([place])  INJECT DIABETES ISTENT INJECT;  Surgeon: Leandrew Koyanagi, MD;  Location: [facility];  Service: Ophthalmology;  Laterality: Left;  Diabetic - oral meds   JOINT REPLACEMENT     left knee replacement   KYPHOPLASTY N/A 03/28/2019   Procedure: L1 KYPHOPLASTY;  Surgeon: Hessie Knows, MD;  Location: [facility];  Service: Orthopedics;  Laterality: N/A;   LASIK     ROTATOR CUFF REPAIR     TONSILLECTOMY     Patient Active Problem List   Diagnosis Date Noted   Acute CVA (cerebrovascular accident) ([place])    Right sided weakness 07/20/2021   Primary osteoarthritis of right knee 09/04/2017   Primary osteoarthritis of left knee 09/04/2017   Status post left partial knee replacement 09/04/2017   Yellow jacket sting 10/31/2016   HTN (hypertension) 10/31/2016   Diabetes ([place]) 10/31/2016   HLD (hyperlipidemia) 10/31/2016    REFERRING DIAG: I63.9 (ICD-10-CM) - CVA (cerebral vascular accident) ([place])  THERAPY DIAG:  Difficulty  in walking, not elsewhere classified  Abnormality of gait and mobility  Other abnormalities of gait and mobility  Unsteadiness on feet  Rationale for Evaluation and Treatment Rehabilitation  PERTINENT HISTORY: Patient presents post CVA on 07/20/2021. 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 PMH: Chickenpox   Diabetes (CMS-HCC)   Diabetes mellitus type 2, controlled, with complications (CMS-HCC) 09/30/313   Glaucoma   Hemorrhoids   Hyperlipidemia   Hypertension   OSA on CPAP 01/24/2017   12/18, AHI 6, RDI 28, no hypoxia    Osteoarthritis   PRECAUTIONS: fall  SUBJECTIVE:  Pt reports he had a difficult time with hauling trailer. Pt reports 2 falls ( with caregiver ensuring her told PT about falls) one in bathtub when he slipped and one when he was putting down a shelf and " kept going forward"  PAIN:  Are you having pain? Yes: NPRS scale: 2/10 Pain location: B knee Pain description: ache Aggravating factors: standing/walking  Relieving factors: rest     TODAY'S TREATMENT: 09/19/21  Exercise/Activity Sets/ Reps/Time/ Resistance Assistance Charge type Comments  LAQ   2 x 10 x 3 sec holds with 3 # AW   TE   1 LE airex 1 LE step with ball handoff  X 10 to ea side   NMR  Started with timed with no ball, rated too easy and progressed to ball hand off, some signs of instability corrected with righting reactions but no UE support required   Seated calf raises with toe box on 1/2 foam X 2 x 15   TE   Forward/ retro step to/from airex   *10 R LE leading   NMR No UE support required this date for this task indicating improved balance reactions   Ambulation with AW  X 160 ft with 3# AW  TE Good endurance, occasional dragging of foot and cues for increased awareness of this,.   Lateral steps on and off of airex  X 10 ea side   NMR First few reps UE A unilateral and following no UE support required. Occasional LOB corrected via pt righting reaction  Mini squats with UE support 2 x 15  TE Cues for hip hinge but not excessive such as in sit to stand.                           Treatment provided this session   Pt educated throughout session about proper posture and technique with exercises. Improved exercise technique, movement at target joints, use of target muscles after min to mod verbal, visual, tactile cues.  Note: Portions of this document were prepared using Dragon voice recognition software and although reviewed may contain unintentional dictation errors in syntax, grammar, or spelling.    PATIENT  EDUCATION: Education details: Exercise techniques Person educated: Patient Education method: Explanation, Demonstration, Tactile cues, and Verbal cues Education comprehension: verbalized understanding, returned demonstration, verbal cues required, tactile cues required, and needs further education   HOME EXERCISE PROGRAM: No updates this visit   PT Short Term Goals -       PT SHORT TERM GOAL #1   Title Pt will be independent with HEP in order to improve strength and balance in order to decrease fall risk and improve function at home and work.    Baseline 08/04/2021= Patient presents with no formal HEP in place    Time 6    Period Weeks    Status New  Target Date 09/15/21              PT Long Term Goals -      PT LONG TERM GOAL #1   Title Pt will improve FOTO to target score of 65 to display perceived improvements in ability to complete ADL's.    Baseline 08/04/2021= 48    Time 12    Period Weeks    Status New    Target Date 10/27/21      PT LONG TERM GOAL #2   Title Pt will improve BERG by at least 3 points in order to demonstrate clinically significant improvement in balance.    Baseline 08/04/2021= 48    Time 12    Period Weeks    Status New    Target Date 10/27/21      PT LONG TERM GOAL #3   Title Pt will decrease 5TSTS by at least 3 seconds in order to demonstrate clinically significant improvement in LE strength.    Baseline 6/15= 26.0 sec with BUE Support    Time 12    Period Weeks    Status New    Target Date 10/27/21      PT LONG TERM GOAL #4   Title Pt will decrease TUG to below 15 seconds/decrease in order to demonstrate decreased fall risk.    Baseline 08/04/2021= 19.37 sec without an AD    Time 12    Period Weeks    Status New    Target Date 10/27/21      PT LONG TERM GOAL #5   Title Pt will increase 10MWT to > 1.0 m/s in order to demonstrate clinically significant improvement in community ambulation.    Baseline 08/04/2021= 0.95 m/s    Time  12    Period Weeks    Status New    Target Date 10/27/21             Plan -     Clinical Impression Statement Continued with current plan of care as laid out in evaluation and recent prior sessions. Pt remains motivated to advance progress toward goals in order to maximize independence and safety at home. Pt requires high level assistance and cuing for completion of exercises in order to provide adequate level of stimulation and perturbation. Pt demonstrated improvement with balance tasks this date and the tasks were progressed as a result. Pt continues to demonstrate progress toward goals AEB progression of some interventions this date either in volume or intensity. Pt will continue to benefit from skilled physical therapy intervention to address impairments, improve QOL, and attain therapy goals.      Personal Factors and Comorbidities Comorbidity 3+    Comorbidities DM, HTN, OA    Examination-Activity Limitations Carry;Lift;Squat;Stairs    Examination-Participation Restrictions Community Activity;Yard Work    Merchant navy officer Evolving/Moderate complexity    Rehab Potential Good    PT Frequency 2x / week    PT Duration 12 weeks    PT Treatment/Interventions ADLs/Self Care Home Management;Canalith Repostioning;Cryotherapy;Electrical Stimulation;Moist Heat;DME Instruction;Gait training;Stair training;Functional mobility training;Therapeutic activities;Therapeutic exercise;Balance training;Neuromuscular re-education;Patient/family education;Manual techniques;Passive range of motion;Dry needling;Vestibular    PT Next Visit Plan Instruct in balance and LE strengthening exercises.    PT Home Exercise Plan 08/08/2021=Access Code: [email or web address]  URL: [email or web address]    Consulted and Agree with Plan of Care Patient;Family member/caregiver                  Particia Lather, PT 09/19/2021, 12:55 PM

## 2021-09-19 NOTE — Therapy (Signed)
OUTPATIENT SPEECH LANGUAGE PATHOLOGY EVALUATION   Patient Name: Vernon Payne. MRN: [id number] DOB:Oct 01, 1950, 72 y.o., male Today's Date: 09/19/2021  PCP: Emily Filbert, MD REFERRING PROVIDER: Emily Filbert, MD   End of Session - 09/19/21 1449     Visit Number 4    Number of Visits 25    Date for SLP Re-Evaluation 11/10/21    Authorization Type [employer] Medicare Choice PPO    Progress Note Due on Visit 10    SLP Start Time 36    SLP Stop Time  1315    SLP Time Calculation (min) 45 min    Activity Tolerance Patient tolerated treatment well             Past Medical History:  Diagnosis Date   Actinic keratosis    Arthritis    Diabetes mellitus without complication (HCC)    GERD (gastroesophageal reflux disease)    Hard of hearing    left hearing aid   HLD (hyperlipidemia)    HTN (hypertension)    Hypotension, postural    IBS (irritable bowel syndrome)    Sleep apnea    Squamous cell carcinoma of skin 01/20/2010   Right chest. Well differentiated. Excised: 01/27/2010   Squamous cell carcinoma of skin 07/15/2019   R preauricular ant to lobe   Wears dentures    partial upper, implants on bottom   Past Surgical History:  Procedure Laterality Date   APPENDECTOMY     BACK SURGERY     lumbar   CATARACT EXTRACTION W/PHACO Right 08/29/2017   Procedure: CATARACT EXTRACTION PHACO AND INTRAOCULAR LENS PLACEMENT ([place]) RIGHT DIABETIC;  Surgeon: Leandrew Koyanagi, MD;  Location: [facility];  Service: Ophthalmology;  Laterality: Right;  ISTENT INJECT   CATARACT EXTRACTION W/PHACO Left 11/21/2017   Procedure: CATARACT EXTRACTION PHACO AND INTRAOCULAR LENS PLACEMENT ([facility]);  Surgeon: Leandrew Koyanagi, MD;  Location: [facility];  Service: Ophthalmology;  Laterality: Left;   EYE SURGERY Left 11/21/2017   cataract excision   INSERTION OF ANTERIOR SEGMENT AQUEOUS DRAINAGE DEVICE (ISTENT) Right 08/29/2017   Procedure: (ISTENT);  Surgeon: Leandrew Koyanagi, MD;   Location: [facility];  Service: Ophthalmology;  Laterality: Right;  Diabetic - oral meds   INSERTION OF ANTERIOR SEGMENT AQUEOUS DRAINAGE DEVICE (ISTENT) Left 11/21/2017   Procedure: INSERTION OF ANTERIOR SEGMENT AQUEOUS DRAINAGE DEVICE ([place])  INJECT DIABETES ISTENT INJECT;  Surgeon: Leandrew Koyanagi, MD;  Location: [facility];  Service: Ophthalmology;  Laterality: Left;  Diabetic - oral meds   JOINT REPLACEMENT     left knee replacement   KYPHOPLASTY N/A 03/28/2019   Procedure: L1 KYPHOPLASTY;  Surgeon: Hessie Knows, MD;  Location: [facility];  Service: Orthopedics;  Laterality: N/A;   LASIK     ROTATOR CUFF REPAIR     TONSILLECTOMY     Patient Active Problem List   Diagnosis Date Noted   Acute CVA (cerebrovascular accident) ([place])    Right sided weakness 07/20/2021   Primary osteoarthritis of right knee 09/04/2017   Primary osteoarthritis of left knee 09/04/2017   Status post left partial knee replacement 09/04/2017   Yellow jacket sting 10/31/2016   HTN (hypertension) 10/31/2016   Diabetes ([place]) 10/31/2016   HLD (hyperlipidemia) 10/31/2016    ONSET DATE: 07/20/2021   REFERRING DIAG:  N98.921 (ICD-10-CM) - Cognitive communication deficit  R13.10 (ICD-10-CM) - Dysphagia  I63.9 (ICD-10-CM) - CVA (cerebral vascular accident) ([place])    THERAPY DIAG:  Dysarthria and anarthria  Acute CVA (cerebrovascular accident) ([place])  Rationale for Evaluation and Treatment Rehabilitation  SUBJECTIVE:   SUBJECTIVE STATEMENT: Pt pleasant, conversant, joking, more engaged; wife present Pt accompanied by: self and significant other  PERTINENT HISTORY: HTN, HLD  PAIN:  Are you having pain? No   FALLS: Has patient fallen in last 6 months?  No  LIVING ENVIRONMENT: Lives with: lives with their spouse Lives in: House/apartment  PLOF:  Level of assistance: Independent with ADLs, Independent with IADLs Employment: Full-time employment   PATIENT GOALS "I think I am  ready"  OBJECTIVE:   DIAGNOSTIC FINDINGS:   MRI - 07/20/2021 Small acute infarct of the left corona radiata extending toward the lentiform nucleus.  Mild chronic microvascular ischemic changes.    PATIENT REPORTED OUTCOME MEASURES (PROM): The Communication Effectiveness Survey is a patient-reported outcome measure in which the patient rates their own effectiveness in different communication situations. A higher score indicates greater effectiveness.  Time of evaluation: Pt's self-rating was 11/32.  Today: 32/32   TODAY'S TREATMENT SESSION: Skilled treatment session focused on pt's speech intelligibility goals. SLP facilitated session by providing pt with complex conversational topics. Pt was Mod I for self-monitoring, self-correcting and use of speech intelligibility strategies to achieve > 95% speech intelligibility at the complex conversation level. Session also focused on completing the Communication Effectiveness Survey.    PATIENT EDUCATION: Education details: results of this assessment, ST POC Person educated: Patient and Spouse Education method: Explanation Education comprehension: verbalized understanding and needs further education     GOALS: Goals reviewed with patient? Yes  SHORT TERM GOALS: Target date: 10 sessions  Pt will use speech intelligibility strategies to achieve > 95% speech intelligibility at the simple conversation level.  Baseline: imprecise articulation Goal status: MET  2.  Pt will completed instrumental swallow assessment.  Baseline: to be scheduled Goal status: MET   LONG TERM GOALS: Target date: 11/09/2021   Pt will utilize speech intelligibility strategies to achieve > 95% intelligibility at the complex conversation level to communicate abstract concepts/thoughts Baseline: < 80% Goal status: MET  2.  Pt will consume least restrictive diet with minimal overt s/s of dysphagia and aspiration.  Baseline: intermittent s/s  Goal status:  MET   ASSESSMENT:  CLINICAL IMPRESSION: Pt presents with improve speech intelligibility d/t Mod I use of speech intelligibility strategies, specifically slow rate. His speech is > 95% intelligible at the complex conversation with unfamiliar listener. He also reports effective communication on the Communication Effectiveness Scale. At this time, pt is appropriate for discharge from [facility].    PLAN: DISCHARGE FROM SERVICES    Madden Piazza B. Rutherford Nail, M.S., CCC-SLP, Mining engineer Certified Brain Injury [place]  [place] Office [phone] Ascom [phone] Fax [phone]

## 2021-09-19 NOTE — Therapy (Signed)
OUTPATIENT OCCUPATIONAL THERAPY TREATMENT NOTE   Patient Name: Vernon Payne. MRN: [id number] DOB:10-24-1950, 71 y.o., male Today's Date: 09/19/2021   REFERRING PROVIDER: Emily Filbert, MD   OT End of Session - 09/19/21 1500     Visit Number 7    Number of Visits 24    Date for OT Re-Evaluation 10/26/21    Authorization Time Period Reporting period beginning 08/04/21    OT Start Time 1145    OT Stop Time 100    OT Time Calculation (min) 45 min    Activity Tolerance Patient tolerated treatment well    Behavior During Therapy WFL for tasks assessed/performed             Past Medical History:  Diagnosis Date   Actinic keratosis    Arthritis    Diabetes mellitus without complication (HCC)    GERD (gastroesophageal reflux disease)    Hard of hearing    left hearing aid   HLD (hyperlipidemia)    HTN (hypertension)    Hypotension, postural    IBS (irritable bowel syndrome)    Sleep apnea    Squamous cell carcinoma of skin 01/20/2010   Right chest. Well differentiated. Excised: 01/27/2010   Squamous cell carcinoma of skin 07/15/2019   R preauricular ant to lobe   Wears dentures    partial upper, implants on bottom   Past Surgical History:  Procedure Laterality Date   APPENDECTOMY     BACK SURGERY     lumbar   CATARACT EXTRACTION W/PHACO Right 08/29/2017   Procedure: CATARACT EXTRACTION PHACO AND INTRAOCULAR LENS PLACEMENT ([place]) RIGHT DIABETIC;  Surgeon: Leandrew Koyanagi, MD;  Location: [place];  Service: Ophthalmology;  Laterality: Right;  ISTENT INJECT   CATARACT EXTRACTION W/PHACO Left 11/21/2017   Procedure: CATARACT EXTRACTION PHACO AND INTRAOCULAR LENS PLACEMENT ([facility]);  Surgeon: Leandrew Koyanagi, MD;  Location: [place];  Service: Ophthalmology;  Laterality: Left;   EYE SURGERY Left 11/21/2017   cataract excision   INSERTION OF ANTERIOR SEGMENT AQUEOUS DRAINAGE DEVICE (ISTENT) Right 08/29/2017   Procedure: (ISTENT);  Surgeon:  Leandrew Koyanagi, MD;  Location: [place];  Service: Ophthalmology;  Laterality: Right;  Diabetic - oral meds   INSERTION OF ANTERIOR SEGMENT AQUEOUS DRAINAGE DEVICE (ISTENT) Left 11/21/2017   Procedure: INSERTION OF ANTERIOR SEGMENT AQUEOUS DRAINAGE DEVICE ([place])  INJECT DIABETES ISTENT INJECT;  Surgeon: Leandrew Koyanagi, MD;  Location: [place];  Service: Ophthalmology;  Laterality: Left;  Diabetic - oral meds   JOINT REPLACEMENT     left knee replacement   KYPHOPLASTY N/A 03/28/2019   Procedure: L1 KYPHOPLASTY;  Surgeon: Hessie Knows, MD;  Location: [facility];  Service: Orthopedics;  Laterality: N/A;   LASIK     ROTATOR CUFF REPAIR     TONSILLECTOMY     Patient Active Problem List   Diagnosis Date Noted   Acute CVA (cerebrovascular accident) ([place])    Right sided weakness 07/20/2021   Primary osteoarthritis of right knee 09/04/2017   Primary osteoarthritis of left knee 09/04/2017   Status post left partial knee replacement 09/04/2017   Yellow jacket sting 10/31/2016   HTN (hypertension) 10/31/2016   Diabetes ([place]) 10/31/2016   HLD (hyperlipidemia) 10/31/2016     REFERRING DIAG: CVA  THERAPY DIAG:  Muscle weakness (generalized)  Other lack of coordination  Acute CVA (cerebrovascular accident) ([place])  Rationale for Evaluation and Treatment Rehabilitation  PERTINENT HISTORY: Vernon Payne. is a 71 y.o. male with medical history significant  of hypertension, diabetes, hyperlipidemia, sleep apnea comes to the emergency room after he started noticing right-sided weakness after he went to work. He started noticing decreased grip of his pen while writing and had on occasion slurred speech. He started filling heaviness on the right side called his wife and was brought to the emergency room.  PRECAUTIONS: None  SUBJECTIVE: Pt reports he's doing better with his hand, but still not using his own chopsticks.  PAIN:  Are you having pain? Yes: NPRS scale:  4/10", Pain location: bilateral knees, Pain Description: sore   OBJECTIVE:   TODAY'S TREATMENT:   08/31/21 Therapeutic Exercise Facilitated 2 point pinch with 2 trials of clipping clothespins onto vertical dowel.  Pt able to clip all colors of resistance, working towards increased strength for 2 point prehension to utilize chopsticks.  Self Care Pt practiced with thin dowels to simulate use of chopsticks and practiced picking up varying sized wooden beads and large jacks to simulate food pick up with fairly good accuracy with repetition.  Practiced handwriting with printing name, signature, and making grocery list.  Practiced signature with focus on speed and fluidity vs legibility and using small rectangle pieces of paper to simulate signing a restaurant receipt.  Advised pt and spouse that pt should be signing all receipts when going out to eat or to the store to gain practice with signature.  Spouse has been signing all receipts but both verbalized understanding and agreed to have pt practice.  Also encouraged pt start practicing self feeding with his chopsticks at home, as this is an important goal for him, as he used chopsticks regularly prior to CVA.  Pt agreed.     PATIENT EDUCATION: Education details: HEP progression, including Investment banker, operational, using chopsticks Person educated: Patient and Spouse Education method: vc, demo Education comprehension: verbalized understanding, returned demonstration, verbal cues required, and needs further education   HOME EXERCISE PROGRAM Handwriting/signature practice, using chopsticks   OT Short Term Goals -        OT SHORT TERM GOAL #1   Title Pt will be indep to perform HEP for improving RUE strength and coordination.    Baseline Eval: initiated, will conitnue to progress    Time 6    Period Weeks    Status New    Target Date 09/14/21              OT Long Term Goals -        OT LONG TERM GOAL #1   Title Pt will  increase FOTO score to 55 or better to indicate perceived significant functional performance with ADLs.    Baseline Eval: FOTO 43    Time 12    Period Weeks    Status New    Target Date 10/26/21      OT LONG TERM GOAL #2   Title Pt will increase R grip strength by 10 or more lbs for improving ability to hold and carry ADL supplies in R dominant hand.    Baseline Eval: R grip 35 (L 69)    Time 12    Period Weeks    Status New    Target Date 10/26/21      OT LONG TERM GOAL #3   Title Pt will increase R hand dexterity to independently manipulate eating utensils for self feeding with dominant hand.    Baseline Eval: using L non-dominant hand to eat, unable to cut food.    Time 12    Period Weeks  Status New    Target Date 10/26/21      OT LONG TERM GOAL #4   Title Pt will increase R lateral pinch strength by 5 or more lbs to enable use of R hand to hold toothbrush.    Baseline Eval: R lateral pinch 3#; uses L hand to brush teeth    Time 12    Period Weeks    Status New    Target Date 10/26/21              Plan -     Clinical Impression Statement Pt practiced with thin dowels to simulate use of chopsticks and practiced picking up varying sized wooden beads and large jacks to simulate food pick up with fairly good accuracy with repetition.  Practiced handwriting with printing name and signature both with good legibility, but slightly slower pace than baseline.  Improved speed with repetition and pt/spouse confirmed that handwriting was less laborious this week as compared to last week.  Advised pt and spouse that pt should be signing all receipts when going out to eat or to the store to gain practice with signature.  Spouse has been signing all receipts but both verbalized understanding and agreed to have pt practice.  Also encouraged pt start practicing self feeding with his chopsticks at home, as this is an important goal for him, as he used chopsticks regularly prior to CVA.  Pt  agreed.   Pt continues to develop strength and coordination in R hand, and will continue to benefit from skilled OT to increase strength and dexterity for better efficiency when engaging R hand into daily tasks.    OT Occupational Profile and History Problem Focused Assessment - Including review of records relating to presenting problem    Occupational performance deficits (Please refer to evaluation for details): ADL's;IADL's;Leisure    Body Structure / Function / Physical Skills ADL;GMC;UE functional use;Balance;Endurance;FMC;Strength;Coordination;Dexterity;IADL;Gait;Sensation    Cognitive Skills Memory;Attention;Energy/Drive    Rehab Potential Excellent    Clinical Decision Making Several treatment options, min-mod task modification necessary    Comorbidities Affecting Occupational Performance: May have comorbidities impacting occupational performance    Modification or Assistance to Complete Evaluation  Min-Moderate modification of tasks or assist with assess necessary to complete eval    OT Frequency 2x / week    OT Duration 12 weeks    OT Treatment/Interventions Self-care/ADL training;Therapeutic exercise;Energy conservation;Therapist, nutritional;Therapeutic activities;Balance training;Moist Heat;Neuromuscular education;DME and/or AE instruction;Manual Therapy;Passive range of motion;Patient/family education    Recommended Other Services SLP to eval and tx for cognitive decline post CVA    Consulted and Agree with Plan of Care Patient;Family member/caregiver    Family Member Consulted Spouse, Rella Larve, MS, OTR/L   Darleene Cleaver, OT 09/19/2021, 3:07 PM

## 2021-09-21 ENCOUNTER — Encounter: Payer: Self-pay | Admitting: Physical Therapy

## 2021-09-21 ENCOUNTER — Ambulatory Visit: Payer: Medicare PPO | Attending: Internal Medicine

## 2021-09-21 ENCOUNTER — Ambulatory Visit: Payer: Medicare PPO | Admitting: Physical Therapy

## 2021-09-21 DIAGNOSIS — R262 Difficulty in walking, not elsewhere classified: Secondary | ICD-10-CM

## 2021-09-21 DIAGNOSIS — R2689 Other abnormalities of gait and mobility: Secondary | ICD-10-CM | POA: Diagnosis present

## 2021-09-21 DIAGNOSIS — R269 Unspecified abnormalities of gait and mobility: Secondary | ICD-10-CM | POA: Insufficient documentation

## 2021-09-21 DIAGNOSIS — M6281 Muscle weakness (generalized): Secondary | ICD-10-CM | POA: Insufficient documentation

## 2021-09-21 DIAGNOSIS — I639 Cerebral infarction, unspecified: Secondary | ICD-10-CM | POA: Diagnosis present

## 2021-09-21 DIAGNOSIS — R2681 Unsteadiness on feet: Secondary | ICD-10-CM | POA: Diagnosis present

## 2021-09-21 DIAGNOSIS — R278 Other lack of coordination: Secondary | ICD-10-CM | POA: Diagnosis present

## 2021-09-21 NOTE — Therapy (Signed)
OUTPATIENT OCCUPATIONAL THERAPY TREATMENT NOTE   Patient Name: Vernon Payne. MRN: [id number] DOB:09/03/1950, 71 y.o., male Today's Date: 09/21/2021   REFERRING PROVIDER: Emily Filbert, MD   OT End of Session - 09/21/21 1121     Visit Number 8    Number of Visits 24    Date for OT Re-Evaluation 10/26/21    Authorization Time Period Reporting period beginning 08/04/21    OT Start Time 1100    OT Stop Time 1145    OT Time Calculation (min) 45 min    Activity Tolerance Patient tolerated treatment well    Behavior During Therapy WFL for tasks assessed/performed             Past Medical History:  Diagnosis Date   Actinic keratosis    Arthritis    Diabetes mellitus without complication (HCC)    GERD (gastroesophageal reflux disease)    Hard of hearing    left hearing aid   HLD (hyperlipidemia)    HTN (hypertension)    Hypotension, postural    IBS (irritable bowel syndrome)    Sleep apnea    Squamous cell carcinoma of skin 01/20/2010   Right chest. Well differentiated. Excised: 01/27/2010   Squamous cell carcinoma of skin 07/15/2019   R preauricular ant to lobe   Wears dentures    partial upper, implants on bottom   Past Surgical History:  Procedure Laterality Date   APPENDECTOMY     BACK SURGERY     lumbar   CATARACT EXTRACTION W/PHACO Right 08/29/2017   Procedure: CATARACT EXTRACTION PHACO AND INTRAOCULAR LENS PLACEMENT ([place]) RIGHT DIABETIC;  Surgeon: Vernon Koyanagi, MD;  Location: [place];  Service: Ophthalmology;  Laterality: Right;  ISTENT INJECT   CATARACT EXTRACTION W/PHACO Left 11/21/2017   Procedure: CATARACT EXTRACTION PHACO AND INTRAOCULAR LENS PLACEMENT ([facility]);  Surgeon: Vernon Koyanagi, MD;  Location: [place];  Service: Ophthalmology;  Laterality: Left;   EYE SURGERY Left 11/21/2017   cataract excision   INSERTION OF ANTERIOR SEGMENT AQUEOUS DRAINAGE DEVICE (ISTENT) Right 08/29/2017   Procedure: (ISTENT);  Surgeon:  Vernon Koyanagi, MD;  Location: [place];  Service: Ophthalmology;  Laterality: Right;  Diabetic - oral meds   INSERTION OF ANTERIOR SEGMENT AQUEOUS DRAINAGE DEVICE ([facility]ENT) Left 11/21/2017   Procedure: INSERTION OF ANTERIOR SEGMENT AQUEOUS DRAINAGE DEVICE ([place])  INJECT DIABETES ISTENT INJECT;  Surgeon: Vernon Koyanagi, MD;  Location: [place];  Service: Ophthalmology;  Laterality: Left;  Diabetic - oral meds   JOINT REPLACEMENT     left knee replacement   KYPHOPLASTY N/A 03/28/2019   Procedure: L1 KYPHOPLASTY;  Surgeon: Vernon Knows, MD;  Location: [facility];  Service: Orthopedics;  Laterality: N/A;   LASIK     ROTATOR CUFF REPAIR     TONSILLECTOMY     Patient Active Problem List   Diagnosis Date Noted   Acute CVA (cerebrovascular accident) ([place])    Right sided weakness 07/20/2021   Primary osteoarthritis of right knee 09/04/2017   Primary osteoarthritis of left knee 09/04/2017   Status post left partial knee replacement 09/04/2017   Yellow jacket sting 10/31/2016   HTN (hypertension) 10/31/2016   Diabetes ([place]) 10/31/2016   HLD (hyperlipidemia) 10/31/2016     REFERRING DIAG: CVA  THERAPY DIAG:  Muscle weakness (generalized)  Other lack of coordination  Acute CVA (cerebrovascular accident) ([place])  Rationale for Evaluation and Treatment Rehabilitation  PERTINENT HISTORY: Vernon Payne. is a 71 y.o. male with medical history significant  of hypertension, diabetes, hyperlipidemia, sleep apnea comes to the emergency room after he started noticing right-sided weakness after he went to work. He started noticing decreased grip of his pen while writing and had on occasion slurred speech. He started filling heaviness on the right side called his wife and was brought to the emergency room.  PRECAUTIONS: None  SUBJECTIVE: Pt reports he practiced a little with his chopsticks at home, and worked on picking up coins from the table this morning.  PAIN:   Are you having pain? Yes: NPRS scale: 4/10, Pain location: bilateral knees, Pain Description: sore   OBJECTIVE:   Hand Function Eval: 08/04/21 09/21/21    Right Hand Grip (lbs) 35  51    Right Hand Lateral Pinch 3 lbs  14    Right Hand 3 Point Pinch 2 lbs  9    Left Hand Grip (lbs) 69      Left Hand Lateral Pinch 21 lbs      Left 3 point pinch 14 lbs      Comment able to oppose only IF to thumb on R hand  Able to oppose digits 2-4 to thumb on R hand   9 hole peg test  (R) 1 min 52 sec; (L) 25 sec (R) 45 sec   TODAY'S TREATMENT:  09/21/21 Therapeutic Exercise Completed R grip strengthening with hand gripper x3 sets 10 reps beginning of session, then 2 more sets 10 reps end of session with 2 green bands for resistance.  Facilitated pinch strengthening with use of therapy resistant clothespins to target lateral, 2 point, and 3 point pinch of R hand; tolerated all colors yellow-black.  Completed R hand digit opposition, actively digits 2-4, and active assisted 5th digit to thumb.  Instructed in using L hand to help the R 5th digit as little as needed to meet the thumb.  Pt able to return demo.    Neuro re-ed: Incorporated motor planning with clothespin activity noted above with quick, alternating movements clipping/unclipping pins with forearm pron/sup, and placing/removing pins with focus on speed for all pinch types noted above.  Practiced picking up, storing, and removing washers from dish and table top and discarding into dish.  Worked with Jamar pegs and pegboard, requiring cues for stabilizing R elbow on table top and using fingertips to move pegs from horizontal position to vertical position in prep for placement of pegs into pegboard (instead of putting pegs back down and trying to pick them up from a different angle).  Pt able to perform with extra time.   PATIENT EDUCATION: Education details: HEP progression Person educated: Patient and Spouse Education method: vc, demo Education  comprehension: verbalized understanding, returned demonstration, verbal cues required, and needs further education   HOME EXERCISE PROGRAM Include theraputty for gripping and pinching (pt stated he had been less frequently using putty)   OT Short Term Goals -        OT SHORT TERM GOAL #1   Title Pt will be indep to perform HEP for improving RUE strength and coordination.    Baseline Eval: initiated, will conitnue to progress    Time 6    Period Weeks    Status New    Target Date 09/14/21              OT Long Term Goals -        OT LONG TERM GOAL #1   Title Pt will increase FOTO score to 55 or better to indicate perceived significant  functional performance with ADLs.    Baseline Eval: FOTO 43    Time 12    Period Weeks    Status New    Target Date 10/26/21      OT LONG TERM GOAL #2   Title Pt will increase R grip strength by 10 or more lbs for improving ability to hold and carry ADL supplies in R dominant hand.    Baseline Eval: R grip 35 (L 69)    Time 12    Period Weeks    Status New    Target Date 10/26/21      OT LONG TERM GOAL #3   Title Pt will increase R hand dexterity to independently manipulate eating utensils for self feeding with dominant hand.    Baseline Eval: using L non-dominant hand to eat, unable to cut food.    Time 12    Period Weeks    Status New    Target Date 10/26/21      OT LONG TERM GOAL #4   Title Pt will increase R lateral pinch strength by 5 or more lbs to enable use of R hand to hold toothbrush.    Baseline Eval: R lateral pinch 3#; uses L hand to brush teeth    Time 12    Period Weeks    Status New    Target Date 10/26/21              Plan -     Clinical Impression Statement Pt reports that he did work a little with his chopsticks at home yesterday, but still finds them a challenge.  Encouraged pt start back with using his theraputty at home to continue to strengthen grip and pinch.  Pt worked with Jamar pegs this date and  is improving with small item pick up with fewer dropped pegs, and is improving in ability to reposition pegs within fingertips.  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    OT Occupational Profile and History Problem Focused Assessment - Including review of records relating to presenting problem    Occupational performance deficits (Please refer to evaluation for details): ADL's;IADL's;Leisure    Body Structure / Function / Physical Skills ADL;GMC;UE functional use;Balance;Endurance;FMC;Strength;Coordination;Dexterity;IADL;Gait;Sensation    Cognitive Skills Memory;Attention;Energy/Drive    Rehab Potential Excellent    Clinical Decision Making Several treatment options, min-mod task modification necessary    Comorbidities Affecting Occupational Performance: May have comorbidities impacting occupational performance    Modification or Assistance to Complete Evaluation  Min-Moderate modification of tasks or assist with assess necessary to complete eval    OT Frequency 2x / week    OT Duration 12 weeks    OT Treatment/Interventions Self-care/ADL training;Therapeutic exercise;Energy conservation;Therapist, nutritional;Therapeutic activities;Balance training;Moist Heat;Neuromuscular education;DME and/or AE instruction;Manual Therapy;Passive range of motion;Patient/family education    Recommended Other Services SLP to eval and tx for cognitive decline post CVA    Consulted and Agree with Plan of Care Patient;Family member/caregiver    Family Member Consulted Spouse, Rella Larve, MS, OTR/L   Darleene Cleaver, OT 09/21/2021, 11:29 AM

## 2021-09-21 NOTE — Therapy (Signed)
OUTPATIENT PHYSICAL THERAPY TREATMENT NOTE   Patient Name: Vernon Payne. MRN: [id number] DOB:02-27-1950, 71 y.o., male Today's Date: 09/21/2021  PCP:  Dr. Emily Filbert REFERRING PROVIDER: Dr. Emily Filbert   PT End of Session - 09/21/21 1149     Visit Number 8    Number of Visits 25    Date for PT Re-Evaluation 10/27/21    Authorization Type [employer]    Authorization Time Period 08/04/2021- 10/27/2021    Progress Note Due on Visit 10    PT Start Time 1148    PT Stop Time 1228    PT Time Calculation (min) 40 min    Equipment Utilized During Treatment Gait belt    Activity Tolerance Patient tolerated treatment well;No increased pain    Behavior During Therapy WFL for tasks assessed/performed              Past Medical History:  Diagnosis Date   Actinic keratosis    Arthritis    Diabetes mellitus without complication (HCC)    GERD (gastroesophageal reflux disease)    Hard of hearing    left hearing aid   HLD (hyperlipidemia)    HTN (hypertension)    Hypotension, postural    IBS (irritable bowel syndrome)    Sleep apnea    Squamous cell carcinoma of skin 01/20/2010   Right chest. Well differentiated. Excised: 01/27/2010   Squamous cell carcinoma of skin 07/15/2019   R preauricular ant to lobe   Wears dentures    partial upper, implants on bottom   Past Surgical History:  Procedure Laterality Date   APPENDECTOMY     BACK SURGERY     lumbar   CATARACT EXTRACTION W/PHACO Right 08/29/2017   Procedure: CATARACT EXTRACTION PHACO AND INTRAOCULAR LENS PLACEMENT ([place]) RIGHT DIABETIC;  Surgeon: Leandrew Koyanagi, MD;  Location: [place];  Service: Ophthalmology;  Laterality: Right;  ISTENT INJECT   CATARACT EXTRACTION W/PHACO Left 11/21/2017   Procedure: CATARACT EXTRACTION PHACO AND INTRAOCULAR LENS PLACEMENT ([facility]);  Surgeon: Leandrew Koyanagi, MD;  Location: [place];  Service: Ophthalmology;  Laterality: Left;   EYE SURGERY Left 11/21/2017    cataract excision   INSERTION OF ANTERIOR SEGMENT AQUEOUS DRAINAGE DEVICE (ISTENT) Right 08/29/2017   Procedure: (ISTENT);  Surgeon: Leandrew Koyanagi, MD;  Location: [place];  Service: Ophthalmology;  Laterality: Right;  Diabetic - oral meds   INSERTION OF ANTERIOR SEGMENT AQUEOUS DRAINAGE DEVICE (ISTENT) Left 11/21/2017   Procedure: INSERTION OF ANTERIOR SEGMENT AQUEOUS DRAINAGE DEVICE ([place])  INJECT DIABETES ISTENT INJECT;  Surgeon: Leandrew Koyanagi, MD;  Location: [place];  Service: Ophthalmology;  Laterality: Left;  Diabetic - oral meds   JOINT REPLACEMENT     left knee replacement   KYPHOPLASTY N/A 03/28/2019   Procedure: L1 KYPHOPLASTY;  Surgeon: Hessie Knows, MD;  Location: [facility];  Service: Orthopedics;  Laterality: N/A;   LASIK     ROTATOR CUFF REPAIR     TONSILLECTOMY     Patient Active Problem List   Diagnosis Date Noted   Acute CVA (cerebrovascular accident) ([place])    Right sided weakness 07/20/2021   Primary osteoarthritis of right knee 09/04/2017   Primary osteoarthritis of left knee 09/04/2017   Status post left partial knee replacement 09/04/2017   Yellow jacket sting 10/31/2016   HTN (hypertension) 10/31/2016   Diabetes ([place]) 10/31/2016   HLD (hyperlipidemia) 10/31/2016    REFERRING DIAG: I63.9 (ICD-10-CM) - CVA (cerebral vascular accident) ([place])  THERAPY DIAG:  Muscle  weakness (generalized)  Difficulty in walking, not elsewhere classified  Abnormality of gait and mobility  Other abnormalities of gait and mobility  Unsteadiness on feet  Rationale for Evaluation and Treatment Rehabilitation  PERTINENT HISTORY: Patient presents post CVA on 07/20/2021. 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 PMH: Chickenpox   Diabetes (CMS-HCC)   Diabetes mellitus type 2, controlled, with complications (CMS-HCC) 4/65/0354   Glaucoma   Hemorrhoids   Hyperlipidemia   Hypertension   OSA on CPAP 01/24/2017   12/18, AHI  6, RDI 28, no hypoxia   Osteoarthritis   PRECAUTIONS: fall  SUBJECTIVE:  Pt reports he had a difficult time with hauling trailer. Pt reports 2 falls ( with caregiver ensuring her told PT about falls) one in bathtub when he slipped and one when he was putting down a shelf and " kept going forward"  PAIN:  Are you having pain? Yes: NPRS scale: 2/10 Pain location: B knee Pain description: ache Aggravating factors: standing/walking  Relieving factors: rest     TODAY'S TREATMENT: 09/21/21  Exercise/Activity Sets/ Reps/Time/ Resistance Assistance Charge type Comments  LAQ   2 x 10 x 3 sec holds with 3 # AW   TE   1 LE airex 1 LE step with ball handoff  X 10 to ea side   NMR  Started with timed with no ball, rated too easy and progressed to ball hand off, some signs of instability corrected with righting reactions but no UE support required   Seated calf raises with toe box on 1/2 foam X 2 x 15 x   TE   Forward step over airex ( L LE on airex to step over)   *5 Ea LE leading   NMR No UE support required, occassionally takes small step   Ambulation with AW  X 300 ft with 3# AW  TE Good endurance, occasional dragging of foot and cues for increased awareness of this,.   Lateral steps on and off of airex  X 10 ea side   NMR First few reps UE A unilateral and following no UE support required. Occasional LOB corrected via pt righting reaction  Standing airex pad shooting baskets   3*15  TE Good balance noted throughout, good weight shifting to R side without LOB   Hip abduction, hip extension with 3# AW  2*10 ea LE   TE To improve hip strength for ambulation                     Treatment provided this session   Pt educated throughout session about proper posture and technique with exercises. Improved exercise technique, movement at target joints, use of target muscles after min to mod verbal, visual, tactile cues.  Note: Portions of this document were prepared using Dragon voice recognition  software and although reviewed may contain unintentional dictation errors in syntax, grammar, or spelling.    PATIENT EDUCATION: Education details: Exercise techniques Person educated: Patient Education method: Explanation, Demonstration, Tactile cues, and Verbal cues Education comprehension: verbalized understanding, returned demonstration, verbal cues required, tactile cues required, and needs further education   HOME EXERCISE PROGRAM: No updates this visit   PT Short Term Goals -       PT SHORT TERM GOAL #1   Title Pt will be independent with HEP in order to improve strength and balance in order to decrease fall risk and improve function at home and work.    Baseline 08/04/2021= Patient presents with  no formal HEP in place    Time 6    Period Weeks    Status New    Target Date 09/15/21              PT Long Term Goals -      PT LONG TERM GOAL #1   Title Pt will improve FOTO to target score of 65 to display perceived improvements in ability to complete ADL's.    Baseline 08/04/2021= 48    Time 12    Period Weeks    Status New    Target Date 10/27/21      PT LONG TERM GOAL #2   Title Pt will improve BERG by at least 3 points in order to demonstrate clinically significant improvement in balance.    Baseline 08/04/2021= 48    Time 12    Period Weeks    Status New    Target Date 10/27/21      PT LONG TERM GOAL #3   Title Pt will decrease 5TSTS by at least 3 seconds in order to demonstrate clinically significant improvement in LE strength.    Baseline 6/15= 26.0 sec with BUE Support    Time 12    Period Weeks    Status New    Target Date 10/27/21      PT LONG TERM GOAL #4   Title Pt will decrease TUG to below 15 seconds/decrease in order to demonstrate decreased fall risk.    Baseline 08/04/2021= 19.37 sec without an AD    Time 12    Period Weeks    Status New    Target Date 10/27/21      PT LONG TERM GOAL #5   Title Pt will increase 10MWT to > 1.0 m/s in  order to demonstrate clinically significant improvement in community ambulation.    Baseline 08/04/2021= 0.95 m/s    Time 12    Period Weeks    Status New    Target Date 10/27/21             Plan -     Clinical Impression Statement Continued with current plan of care as laid out in evaluation and recent prior sessions. Pt remains motivated to advance progress toward goals in order to maximize independence and safety at home. Pt requires high level assistance and cuing for completion of exercises in order to provide adequate level of stimulation and perturbation. Pt demonstrated improvement with balance tasks this date and the tasks were progressed as a result. Pt continues to demonstrate progress toward goals AEB progression of some interventions this date either in volume or intensity.  Patient demonstrates good balance responses this session with no loss of balance with high-level balance activities.  We will continue to challenge patient future sessions.  Pt will continue to benefit from skilled physical therapy intervention to address impairments, improve QOL, and attain therapy goals.      Personal Factors and Comorbidities Comorbidity 3+    Comorbidities DM, HTN, OA    Examination-Activity Limitations Carry;Lift;Squat;Stairs    Examination-Participation Restrictions Community Activity;Yard Work    [employer]chant [employer] officer Evolving/Moderate complexity    Rehab Potential Good    PT Frequency 2x / week    PT Duration 12 weeks    PT Treatment/Interventions ADLs/Self Care Home Management;Canalith Repostioning;Cryotherapy;Electrical Stimulation;Moist Heat;DME Instruction;Gait training;Stair training;Functional mobility training;Therapeutic activities;Therapeutic exercise;Balance training;Neuromuscular re-education;Patient/family education;Manual techniques;Passive range of motion;Dry needling;Vestibular    PT Next Visit Plan Instruct in balance and LE strengthening exercises.  PT Home Exercise Plan 08/08/2021=Access Code: [email or web address]  URL: [email or web address]    Consulted and Agree with Plan of Care Patient;Family member/caregiver                  Particia Lather, PT 09/21/2021, 1:24 PM

## 2021-09-28 ENCOUNTER — Ambulatory Visit: Payer: Medicare PPO

## 2021-09-28 DIAGNOSIS — R278 Other lack of coordination: Secondary | ICD-10-CM

## 2021-09-28 DIAGNOSIS — R262 Difficulty in walking, not elsewhere classified: Secondary | ICD-10-CM

## 2021-09-28 DIAGNOSIS — I639 Cerebral infarction, unspecified: Secondary | ICD-10-CM

## 2021-09-28 DIAGNOSIS — M6281 Muscle weakness (generalized): Secondary | ICD-10-CM | POA: Diagnosis not present

## 2021-09-28 DIAGNOSIS — R269 Unspecified abnormalities of gait and mobility: Secondary | ICD-10-CM

## 2021-09-28 DIAGNOSIS — R2689 Other abnormalities of gait and mobility: Secondary | ICD-10-CM

## 2021-09-28 DIAGNOSIS — R2681 Unsteadiness on feet: Secondary | ICD-10-CM

## 2021-09-28 NOTE — Therapy (Signed)
OUTPATIENT PHYSICAL THERAPY TREATMENT NOTE/Discharge Summary   Patient Name: Vernon Payne. MRN: [id number] DOB:1951-01-10, 71 y.o., male Today's Date: 09/30/2021  PCP:  Dr. Emily Filbert REFERRING PROVIDER: Dr. Emily Filbert   PT End of Session - 09/30/21 0941     Visit Number 9    Number of Visits 25    Date for PT Re-Evaluation 10/27/21    Authorization Type [employer]    Authorization Time Period 08/04/2021- 10/27/2021    Progress Note Due on Visit 10    PT Start Time 1104    PT Stop Time 1143    PT Time Calculation (min) 39 min    Equipment Utilized During Treatment Gait belt    Activity Tolerance Patient tolerated treatment well;No increased pain    Behavior During Therapy [facility] for tasks assessed/performed             PT End of Session - 09/30/21 0941     Visit Number 9    Number of Visits 25    Date for PT Re-Evaluation 10/27/21    Authorization Type [employer]    Authorization Time Period 08/04/2021- 10/27/2021    Progress Note Due on Visit 10    PT Start Time 1104    PT Stop Time 1143    PT Time Calculation (min) 39 min    Equipment Utilized During Treatment Gait belt    Activity Tolerance Patient tolerated treatment well;No increased pain    Behavior During Therapy WFL for tasks assessed/performed               Past Medical History:  Diagnosis Date   Actinic keratosis    Arthritis    Diabetes mellitus without complication (HCC)    GERD (gastroesophageal reflux disease)    Hard of hearing    left hearing aid   HLD (hyperlipidemia)    HTN (hypertension)    Hypotension, postural    IBS (irritable bowel syndrome)    Sleep apnea    Squamous cell carcinoma of skin 01/20/2010   Right chest. Well differentiated. Excised: 01/27/2010   Squamous cell carcinoma of skin 07/15/2019   R preauricular ant to lobe   Wears dentures    partial upper, implants on bottom   Past Surgical History:  Procedure Laterality Date   APPENDECTOMY     BACK SURGERY      lumbar   CATARACT EXTRACTION W/PHACO Right 08/29/2017   Procedure: CATARACT EXTRACTION PHACO AND INTRAOCULAR LENS PLACEMENT ([place]) RIGHT DIABETIC;  Surgeon: Leandrew Koyanagi, MD;  Location: [place];  Service: Ophthalmology;  Laterality: Right;  ISTENT INJECT   CATARACT EXTRACTION W/PHACO Left 11/21/2017   Procedure: CATARACT EXTRACTION PHACO AND INTRAOCULAR LENS PLACEMENT ([facility]);  Surgeon: Leandrew Koyanagi, MD;  Location: [place];  Service: Ophthalmology;  Laterality: Left;   EYE SURGERY Left 11/21/2017   cataract excision   INSERTION OF ANTERIOR SEGMENT AQUEOUS DRAINAGE DEVICE (ISTENT) Right 08/29/2017   Procedure: (ISTENT);  Surgeon: Leandrew Koyanagi, MD;  Location: [place];  Service: Ophthalmology;  Laterality: Right;  Diabetic - oral meds   INSERTION OF ANTERIOR SEGMENT AQUEOUS DRAINAGE DEVICE (ISTENT) Left 11/21/2017   Procedure: INSERTION OF ANTERIOR SEGMENT AQUEOUS DRAINAGE DEVICE ([place])  INJECT DIABETES ISTENT INJECT;  Surgeon: Leandrew Koyanagi, MD;  Location: [place];  Service: Ophthalmology;  Laterality: Left;  Diabetic - oral meds   JOINT REPLACEMENT     left knee replacement   KYPHOPLASTY N/A 03/28/2019   Procedure: L1 KYPHOPLASTY;  Surgeon: Hessie Knows, MD;  Location: [facility];  Service: Orthopedics;  Laterality: N/A;   LASIK     ROTATOR CUFF REPAIR     TONSILLECTOMY     Patient Active Problem List   Diagnosis Date Noted   Acute CVA (cerebrovascular accident) ([place])    Right sided weakness 07/20/2021   Primary osteoarthritis of right knee 09/04/2017   Primary osteoarthritis of left knee 09/04/2017   Status post left partial knee replacement 09/04/2017   Yellow jacket sting 10/31/2016   HTN (hypertension) 10/31/2016   Diabetes ([place]) 10/31/2016   HLD (hyperlipidemia) 10/31/2016    REFERRING DIAG: I63.9 (ICD-10-CM) - CVA (cerebral vascular accident) ([place])  THERAPY DIAG:  Muscle weakness  (generalized)  Difficulty in walking, not elsewhere classified  Abnormality of gait and mobility  Other abnormalities of gait and mobility  Unsteadiness on feet  Other lack of coordination  Acute CVA (cerebrovascular accident) ([place])  Rationale for Evaluation and Treatment Rehabilitation  PERTINENT HISTORY: Patient presents post CVA on 07/20/2021. 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 PMH: Chickenpox   Diabetes (CMS-HCC)   Diabetes mellitus type 2, controlled, with complications (CMS-HCC) 0/16/0109   Glaucoma   Hemorrhoids   Hyperlipidemia   Hypertension   OSA on CPAP 01/24/2017   12/18, AHI 6, RDI 28, no hypoxia   Osteoarthritis   PRECAUTIONS: fall  SUBJECTIVE:  Patient reports doing really well these days- denies any significant loss of balance and no pain. Patient reports if he is doing well he would like to discharge if doing well enough.   PAIN:  Are you having pain? Yes: NPRS scale: 2/10 Pain location: B knee Pain description: ache Aggravating factors: standing/walking  Relieving factors: rest     TODAY'S TREATMENT: 09/28/2021  Reassessed all remaining goals for potential Discharge visit- See goal section for all details   Pt educated throughout session about proper posture and technique with exercises. Improved exercise technique, movement at target joints, use of target muscles after min to mod verbal, visual, tactile cues.  Note: Portions of this document were prepared using Dragon voice recognition software and although reviewed may contain unintentional dictation errors in syntax, grammar, or spelling.    PATIENT EDUCATION: Education details: Exercise techniques Person educated: Patient Education method: Explanation, Demonstration, Tactile cues, and Verbal cues Education comprehension: verbalized understanding, returned demonstration, verbal cues required, tactile cues required, and needs further education   HOME EXERCISE  PROGRAM: No updates this visit   PT Short Term Goals -       PT SHORT TERM GOAL #1   Title Pt will be independent with HEP in order to improve strength and balance in order to decrease fall risk and improve function at home and work.    Baseline 08/04/2021= Patient presents with no formal HEP in place; 09/28/2021- Patient able to verbalize good understanding of exercises without difficulty - including walking and balance activities - reminded him to avoid any activity that increases his knee pain.    Time 6    Period Weeks    Status GOAL MET   Target Date 09/15/21              PT Long Term Goals -      PT LONG TERM GOAL #1   Title Pt will improve FOTO to target score of 65 to display perceived improvements in ability to complete ADL's.    Baseline 08/04/2021= 48; 09/28/2021= 76   Time 12    Period Weeks  Status Goal Met   Target Date 10/27/21      PT LONG TERM GOAL #2   Title Pt will improve BERG by at least 3 points in order to demonstrate clinically significant improvement in balance.    Baseline 08/04/2021= 48; 09/28/2021= 53/56   Time 12    Period Weeks    Status Goal Met   Target Date 10/27/21      PT LONG TERM GOAL #3   Title Pt will decrease 5TSTS by at least 3 seconds in order to demonstrate clinically significant improvement in LE strength.    Baseline 6/15= 26.0 sec with BUE Support; 09/28/2021= 19.82 sec without UE Support   Time 12    Period Weeks    Status Goal Met   Target Date 10/27/21      PT LONG TERM GOAL #4   Title Pt will decrease TUG to below 15 seconds/decrease in order to demonstrate decreased fall risk.    Baseline 08/04/2021= 19.37 sec without an AD; 09/28/2021= 8.68 sec without AD   Time 12    Period Weeks    Status Goal met   Target Date 10/27/21      PT LONG TERM GOAL #5   Title Pt will increase 10MWT to > 1.0 m/s in order to demonstrate clinically significant improvement in community ambulation.    Baseline 08/04/2021= 0.95 m/s; 09/28/2021= 1.02  m/s without an AD.   Time 12    Period Weeks    Status Goal met   Target Date 10/27/21             Plan -     Clinical Impression Statement Patient presented today asking to potentially be discharged as he feels he is doing a lot better and so visit consisted of reassessing all goals. Patient has made excellent progress and has met all goals as of today. He presented with clinically significant improvement in self-perceived abilities, Improved dynamic standing balance with no report of falling, improved LE strength and gait speed. He is appropriate for discharge at this time with no further skilled PT needs. Patient in agreement with plan of care.     Personal Factors and Comorbidities Comorbidity 3+    Comorbidities DM, HTN, OA    Examination-Activity Limitations Carry;Lift;Squat;Stairs    Examination-Participation Restrictions Community Activity;Yard Work    [employer]chant [employer] officer Evolving/Moderate complexity    Rehab Potential Good    PT Frequency 2x / week    PT Duration 12 weeks    PT Treatment/Interventions ADLs/Self Care Home Management;Canalith Repostioning;Cryotherapy;Electrical Stimulation;Moist Heat;DME Instruction;Gait training;Stair training;Functional mobility training;Therapeutic activities;Therapeutic exercise;Balance training;Neuromuscular re-education;Patient/family education;Manual techniques;Passive range of motion;Dry needling;Vestibular    PT Next Visit Plan Instruct in balance and LE strengthening exercises.    PT Home Exercise Plan 08/08/2021=Access Code: [email or web address]  URL: [email or web address]    Consulted and Agree with Plan of Care Patient;Family member/caregiver                  Lewis Moccasin, PT 09/30/2021, 9:42 AM

## 2021-09-28 NOTE — Therapy (Signed)
OUTPATIENT OCCUPATIONAL THERAPY DISCHARGE NOTE   Patient Name: Vernon Payne. MRN: [id number] DOB:January 21, 1951, 71 y.o., male Today's Date: 09/28/2021   REFERRING PROVIDER: Emily Filbert, MD   OT End of Session - 09/28/21 1143     Visit Number 9    Number of Visits 24    Date for OT Re-Evaluation 10/26/21    Authorization Time Period Reporting period beginning 08/04/21    OT Start Time 1145    OT Stop Time 6    OT Time Calculation (min) 45 min    Activity Tolerance Patient tolerated treatment well    Behavior During Therapy WFL for tasks assessed/performed             Past Medical History:  Diagnosis Date   Actinic keratosis    Arthritis    Diabetes mellitus without complication (HCC)    GERD (gastroesophageal reflux disease)    Hard of hearing    left hearing aid   HLD (hyperlipidemia)    HTN (hypertension)    Hypotension, postural    IBS (irritable bowel syndrome)    Sleep apnea    Squamous cell carcinoma of skin 01/20/2010   Right chest. Well differentiated. Excised: 01/27/2010   Squamous cell carcinoma of skin 07/15/2019   R preauricular ant to lobe   Wears dentures    partial upper, implants on bottom   Past Surgical History:  Procedure Laterality Date   APPENDECTOMY     BACK SURGERY     lumbar   CATARACT EXTRACTION W/PHACO Right 08/29/2017   Procedure: CATARACT EXTRACTION PHACO AND INTRAOCULAR LENS PLACEMENT ([place]) RIGHT DIABETIC;  Surgeon: Vernon Koyanagi, MD;  Location: [facility];  Service: Ophthalmology;  Laterality: Right;  ISTENT INJECT   CATARACT EXTRACTION W/PHACO Left 11/21/2017   Procedure: CATARACT EXTRACTION PHACO AND INTRAOCULAR LENS PLACEMENT ([facility]);  Surgeon: Vernon Koyanagi, MD;  Location: [facility];  Service: Ophthalmology;  Laterality: Left;   EYE SURGERY Left 11/21/2017   cataract excision   INSERTION OF ANTERIOR SEGMENT AQUEOUS DRAINAGE DEVICE (ISTENT) Right 08/29/2017   Procedure: (ISTENT);  Surgeon:  Vernon Koyanagi, MD;  Location: [facility];  Service: Ophthalmology;  Laterality: Right;  Diabetic - oral meds   INSERTION OF ANTERIOR SEGMENT AQUEOUS DRAINAGE DEVICE (ISTENT) Left 11/21/2017   Procedure: INSERTION OF ANTERIOR SEGMENT AQUEOUS DRAINAGE DEVICE ([place])  INJECT DIABETES ISTENT INJECT;  Surgeon: Vernon Koyanagi, MD;  Location: [place] [facility];  Service: Ophthalmology;  Laterality: Left;  Diabetic - oral meds   JOINT REPLACEMENT     left knee replacement   KYPHOPLASTY N/A 03/28/2019   Procedure: L1 KYPHOPLASTY;  Surgeon: Vernon Knows, MD;  Location: [facility];  Service: Orthopedics;  Laterality: N/A;   LASIK     ROTATOR CUFF REPAIR     TONSILLECTOMY     Patient Active Problem List   Diagnosis Date Noted   Acute CVA (cerebrovascular accident) ([place])    Right sided weakness 07/20/2021   Primary osteoarthritis of right knee 09/04/2017   Primary osteoarthritis of left knee 09/04/2017   Status post left partial knee replacement 09/04/2017   Yellow jacket sting 10/31/2016   HTN (hypertension) 10/31/2016   Diabetes ([place]) 10/31/2016   HLD (hyperlipidemia) 10/31/2016     REFERRING DIAG: CVA  THERAPY DIAG:  Muscle weakness (generalized)  Other lack of coordination  Acute CVA (cerebrovascular accident) ([place])  Rationale for Evaluation and Treatment Rehabilitation  PERTINENT HISTORY: Vernon Payne. is a 71 y.o. male with medical history significant  of hypertension, diabetes, hyperlipidemia, sleep apnea comes to the emergency room after he started noticing right-sided weakness after he went to work. He started noticing decreased grip of his pen while writing and had on occasion slurred speech. He started filling heaviness on the right side called his wife and was brought to the emergency room.  PRECAUTIONS: None  SUBJECTIVE: Pt reports readiness to discharge today.  Pt states his spouse will be returning to work and he will have inconsistent rides to  therapy, but pt feels ready to d/c and continue with HEP on his own.   PAIN:  Are you having pain? Yes: NPRS scale: 4/10, Pain location: bilateral knees, Pain Description: sore   OBJECTIVE:   Hand Function Eval: 08/04/21 09/21/21 09/28/21    Right Hand Grip (lbs) 35  51 55    Right Hand Lateral Pinch 3 lbs  14 17    Right Hand 3 Point Pinch 2 lbs  9 10    Left Hand Grip (lbs) 69   69    Left Hand Lateral Pinch 21 lbs   21    Left 3 point pinch 14 lbs   14    Comment able to oppose only IF to thumb on R hand  Able to oppose digits 2-4 to thumb on R hand Able to oppose digits 2-4 to thumb on R hand   9 hole peg test  (R) 1 min 52 sec; (L) 25 sec (R) 45 sec (R) 37 sec   TODAY'S TREATMENT:  09/21/21 Therapeutic Exercise Completed R grip strengthening with hand gripper x3 sets 10 reps beginning of session, then 2 more sets 10 reps end of session with 2 green bands for resistance.  Issued strong blue theraputty.  Reprinted handout per pt request with review of all putty exercises.  Neuro re-ed: Facilitated R [facility] working with [employer].  Good ability to pick up and manipulate all 3 pieces (peg, spacer, and washer).  PATIENT EDUCATION: Education details: HEP review Person educated: Patient and Spouse Education method: vc, demo Education comprehension: good return Systems developer.  Indep with HEP.   HOME EXERCISE PROGRAM Continue with theraputty HEP, playing solitaire on table top (not phone or computer) to facilitate visual scanning.  Continue to practice with using chopsticks for better efficiency (pt's personal goal)   OT Short Term Goals -        OT SHORT TERM GOAL #1   Title Pt will be indep to perform HEP for improving RUE strength and coordination.    Baseline Eval: initiated, will conitnue to progress; 09/28/21: indep   Time 6    Period Weeks    Status achieved   Target Date 09/14/21              OT Long Term Goals -        OT LONG TERM GOAL #1   Title Pt will  increase FOTO score to 55 or better to indicate perceived significant functional performance with ADLs.    Baseline Eval: FOTO 43, 09/28/21 d/c: 70   Time 12    Period Weeks    Status achieved   Target Date 10/26/21      OT LONG TERM GOAL #2   Title Pt will increase R grip strength by 10 or more lbs for improving ability to hold and carry ADL supplies in R dominant hand.    Baseline Eval: R grip 35 (L 69); 09/28/21 d/c: R grip 55   Time 12  Period Weeks    Status achieved   Target Date 10/26/21      OT LONG TERM GOAL #3   Title Pt will increase R hand dexterity to independently manipulate eating utensils for self feeding with dominant hand.    Baseline Eval: using L non-dominant hand to eat, unable to cut food; 09/28/21 d/c: Pt now using R dominant hand with fork and spoon without difficulty; pt can now use chopsticks but will less efficiency as compared to baseline   Time 12    Period Weeks    Status achieved   Target Date 10/26/21      OT LONG TERM GOAL #4   Title Pt will increase R lateral pinch strength by 5 or more lbs to enable use of R hand to hold toothbrush.    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  Pt reports readiness to d/c OT this date as spouse will be returning to work and pt will have inconsistent rides to therapy.  Pt has met all OT goals for strength, coordination, and is now using  R hand for all daily tasks.  Pt's FOTO score has increased from 43 to 70.  Pt still has mild discrepancy in R grip strength and mild motor planning deficits as compared to the L hand, but pt is indep with HEP and will continue to target these  skills with his HEP.  Pt in agreement with plan for d/c this date.    OT Occupational Profile and History Problem Focused Assessment - Including review of records relating to presenting problem    Occupational performance deficits (Please refer to evaluation for details): ADL's;IADL's;Leisure    Body Structure / Function / Physical Skills ADL;GMC;UE functional use;Balance;Endurance;FMC;Strength;Coordination;Dexterity;IADL;Gait;Sensation    Cognitive Skills Memory;Attention;Energy/Drive    Rehab Potential Excellent    Clinical Decision Making Several treatment options, min-mod task modification necessary    Comorbidities Affecting Occupational Performance: May have comorbidities impacting occupational performance    Modification or Assistance to Complete Evaluation  Min-Moderate modification of tasks or assist with assess necessary to complete eval    OT Frequency 2x / week    OT Duration 12 weeks    OT Treatment/Interventions Self-care/ADL training;Therapeutic exercise;Energy conservation;Therapist, nutritional;Therapeutic activities;Balance training;Moist Heat;Neuromuscular education;DME and/or AE instruction;Manual Therapy;Passive range of motion;Patient/family education    Recommended Other Services SLP to eval and tx for cognitive decline post CVA    Consulted and Agree with Plan of Care Patient;Family member/caregiver    Family Member Consulted Spouse, Rella Larve, MS, OTR/L   Darleene Cleaver, OT 09/28/2021, 12:56 PM

## 2021-09-30 ENCOUNTER — Ambulatory Visit: Payer: Medicare PPO

## 2021-09-30 ENCOUNTER — Ambulatory Visit: Payer: Medicare PPO | Admitting: Occupational Therapy

## 2021-10-04 ENCOUNTER — Ambulatory Visit: Payer: Medicare PPO

## 2021-10-06 ENCOUNTER — Ambulatory Visit: Payer: Medicare PPO

## 2021-10-10 ENCOUNTER — Ambulatory Visit: Payer: Medicare PPO

## 2021-10-12 ENCOUNTER — Ambulatory Visit: Payer: Medicare PPO | Admitting: Physical Therapy

## 2021-10-12 ENCOUNTER — Encounter: Payer: Medicare PPO | Admitting: Occupational Therapy

## 2021-10-17 ENCOUNTER — Ambulatory Visit: Payer: Medicare PPO

## 2021-10-19 ENCOUNTER — Ambulatory Visit: Payer: Medicare PPO

## 2021-10-21 ENCOUNTER — Encounter: Payer: Medicare PPO | Admitting: Occupational Therapy

## 2021-11-02 ENCOUNTER — Ambulatory Visit: Payer: Medicare PPO | Admitting: Physical Therapy

## 2021-11-07 ENCOUNTER — Ambulatory Visit: Payer: Medicare PPO

## 2021-11-09 ENCOUNTER — Ambulatory Visit: Payer: Medicare PPO | Admitting: Physical Therapy

## 2021-11-11 ENCOUNTER — Telehealth: Payer: Self-pay | Admitting: Internal Medicine

## 2021-11-11 DIAGNOSIS — I639 Cerebral infarction, unspecified: Secondary | ICD-10-CM

## 2021-11-11 NOTE — Telephone Encounter (Signed)
OT order for post cva

## 2021-11-16 ENCOUNTER — Ambulatory Visit: Payer: Medicare PPO | Admitting: Physical Therapy

## 2021-11-23 ENCOUNTER — Ambulatory Visit: Payer: Medicare PPO | Admitting: Physical Therapy

## 2021-11-25 ENCOUNTER — Ambulatory Visit: Payer: Medicare PPO

## 2021-11-30 ENCOUNTER — Ambulatory Visit: Payer: Medicare PPO | Admitting: Physical Therapy

## 2021-12-07 ENCOUNTER — Ambulatory Visit: Payer: Medicare PPO | Admitting: Physical Therapy

## 2021-12-09 ENCOUNTER — Ambulatory Visit: Payer: Medicare PPO | Admitting: Physical Therapy

## 2021-12-14 ENCOUNTER — Ambulatory Visit: Payer: Medicare PPO

## 2021-12-16 ENCOUNTER — Ambulatory Visit: Payer: Medicare PPO | Admitting: Physical Therapy

## 2021-12-21 ENCOUNTER — Ambulatory Visit: Payer: Medicare PPO | Admitting: Physical Therapy

## 2021-12-23 ENCOUNTER — Ambulatory Visit: Payer: Medicare PPO | Admitting: Physical Therapy

## 2021-12-26 ENCOUNTER — Ambulatory Visit: Payer: Medicare PPO | Attending: Neurology | Admitting: Speech Pathology

## 2021-12-26 DIAGNOSIS — G3184 Mild cognitive impairment, so stated: Secondary | ICD-10-CM | POA: Insufficient documentation

## 2021-12-26 DIAGNOSIS — R41841 Cognitive communication deficit: Secondary | ICD-10-CM | POA: Insufficient documentation

## 2021-12-27 NOTE — Therapy (Signed)
[place] EVALUATION   Patient Name: Vernon Payne. MRN: [id number] DOB:1950-11-02, 71 y.o., male Today's Date: 12/27/2021  PCP: Emily Filbert, MD REFERRING PROVIDER: Jennings Books, MD   End of Session - 12/27/21 0839     Visit Number 1    Number of Visits 25    Date for SLP Re-Evaluation 03/20/22    Authorization Type [employer] P[employer]    Progress Note Due on Visit 10    SLP Start Time 1000    SLP Stop Time  1100    SLP Time Calculation (min) 60 min    Activity Tolerance Patient tolerated treatment well             Past Medical History:  Diagnosis Date   Actinic keratosis    Arthritis    Diabetes mellitus without complication (HCC)    GERD (gastroesophageal reflux disease)    Hard of hearing    left hearing aid   HLD (hyperlipidemia)    HTN (hypertension)    Hypotension, postural    IBS (irritable bowel syndrome)    Sleep apnea    Squamous cell carcinoma of skin 01/20/2010   Right chest. Well differentiated. Excised: 01/27/2010   Squamous cell carcinoma of skin 07/15/2019   R preauricular ant to lobe   Wears dentures    partial upper, implants on bottom   Past Surgical History:  Procedure Laterality Date   APPENDECTOMY     BACK SURGERY     lumbar   CATARACT EXTRACTION W/PHACO Right 08/29/2017   Procedure: CATARACT EXTRACTION PHACO AND INTRAOCULAR LENS PLACEMENT ([place]) RIGHT DIABETIC;  Surgeon: Leandrew Koyanagi, MD;  Location: [facility];  Service: Ophthalmology;  Laterality: Right;  ISTENT INJECT   CATARACT EXTRACTION W/PHACO Left 11/21/2017   Procedure: CATARACT EXTRACTION PHACO AND INTRAOCULAR LENS PLACEMENT ([facility]);  Surgeon: Leandrew Koyanagi, MD;  Location: [facility];  Service: Ophthalmology;  Laterality: Left;   EYE SURGERY Left 11/21/2017   cataract excision   INSERTION OF ANTERIOR SEGMENT AQUEOUS DRAINAGE DEVICE (ISTENT) Right 08/29/2017   Procedure: (ISTENT);  Surgeon: Leandrew Koyanagi, MD;  Location: [facility];  Service: Ophthalmology;  Laterality: Right;  Diabetic - oral meds   INSERTION OF ANTERIOR SEGMENT AQUEOUS DRAINAGE DEVICE (ISTENT) Left 11/21/2017   Procedure: INSERTION OF ANTERIOR SEGMENT AQUEOUS DRAINAGE DEVICE ([place])  INJECT DIABETES ISTENT INJECT;  Surgeon: Leandrew Koyanagi, MD;  Location: [place];  Service: Ophthalmology;  Laterality: Left;  Diabetic - oral meds   JOINT REPLACEMENT     left knee replacement   KYPHOPLASTY N/A 03/28/2019   Procedure: L1 KYPHOPLASTY;  Surgeon: Hessie Knows, MD;  Location: [facility];  Service: Orthopedics;  Laterality: N/A;   LASIK     ROTATOR CUFF REPAIR     TONSILLECTOMY     Patient Active Problem List   Diagnosis Date Noted   Acute CVA (cerebrovascular accident) ([place])    Right sided weakness 07/20/2021   Primary osteoarthritis of right knee 09/04/2017   Primary osteoarthritis of left knee 09/04/2017   Status post left partial knee replacement 09/04/2017   Yellow jacket sting 10/31/2016   HTN (hypertension) 10/31/2016   Diabetes ([place]) 10/31/2016   HLD (hyperlipidemia) 10/31/2016    ONSET DATE: 07/20/2021; date of referral 12/20/2021  REFERRING DIAG: R41.89 (ICD-10-CM) - Other symptoms and signs involving cognitive functions and awareness   THERAPY DIAG:  Cognitive communication deficit  Mild cognitive impairment  Rationale for Evaluation and Treatment Rehabilitation  SUBJECTIVE:   SUBJECTIVE STATEMENT: Pt joking, known to this writer Pt accompanied by: self  PERTINENT HISTORY: Vernon Payne. is a 71 y.o. male with medical history significant of hypertension, diabetes, hyperlipidemia, sleep apnea comes to the emergency room after he started noticing right-sided weakness after he went to work. He started noticing decreased grip of his pen while writing and had on occasion slurred speech. He started filling heaviness on the right side called his wife and was brought to the  emergency room on 07/20/2021. Pt received Outpatient ST services from 08/17/2021 thru 09/19/2021.   DIAGNOSTIC FINDINGS:  CT Head 07/20/2021: Normal head CT for age.  MRI brain 07/20/2021: Small acute infarct of the left corona radiata extending toward the lentiform nucleus. Mild chronic microvascular ischemic changes.    CT Angio of head and neck 07/20/2021: No emergent large vessel occlusion. 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 Otherwise, patent intracranial vasculature. Mild calcified plaque at the carotid bifurcations without hemodynamically significant stenosis. Mild stenosis at the origin of the right vertebral artery.   ECHO 07/20/2021: Left ventricular ejection fraction, by estimation, is 55 to 60%. The left ventricle has normal function. The left ventricle has no regional wall motion abnormalities. Left ventricular diastolic parameters are consistent with Grade I diastolic dysfunction (impaired relaxation). Right ventricular systolic function is normal. The right ventricular size is normal. The mitral valve is normal in structure. Mild mitral valve regurgitation. No evidence of mitral stenosis. The aortic valve is normal in structure. Aortic valve regurgitation is not visualized. No aortic stenosis is present. The inferior vena cava is normal in size with greater than 50% respiratory variability, suggesting right atrial pressure of 3 mmHg.   PAIN:  Are you having pain? No   FALLS: Has patient fallen in last 6 months?  No  LIVING ENVIRONMENT: Lives with: lives with their spouse Lives in: House/apartment  PLOF:  Level of assistance: Independent with ADLs, Independent with IADLs Employment: Retired   PATIENT GOALS "to feel like myself again"  OBJECTIVE:   COGNITIVE COMMUNICATION Overall cognitive status: Impaired Areas of impairment:  Attention: Impaired: Divided Memory: Impaired:  Working Industrial/product designer term Awareness: WFL Executive function: Impaired: Impulse control, Problem solving, Organization, Planning, and Slow processing Behavior: Impulsive Auditory comprehension: WFL Verbal expression: Impaired: word finding; severely impaired generative naming of items and by initial letter Functional communication: Impaired: word finding deficits, imprecise articulation, decreased speech intelligibility Functional deficits: decreased verbal organization; decrease speech intelligibility ~ 80%   ORAL MOTOR EXAMINATION Facial : WFL Lingual: Comment: generalized weakness Velum: WFL Mandible: WFL Cough: WFL Voice: Wet   STANDARDIZED ASSESSMENTS: The HVLT-R is a list learning test, which consists of 12 nouns within three semantic groups. The test has three learning trials in which the administrator reads the words aloud and then asks the patient to repeat as many as he/she can remember in any order. The three learning trials are used to calculate a Total Recall Score and Recognition Discrimination Index (RDI).  Immediate Recall: Trial 1: 4 (mean 5.8 / SD: 1.9)   Trial 2: 7 (mean 8.3 / SD: 2) Trial 3: 7  (mean 9.4 / SD: 2) Total Recall Score:  18 (mean 23.4/ SD 5.2)   Pt's performance was below the average range     PATIENT REPORTED OUTCOME MEASURES (PROM): Will administer during next session      PATIENT EDUCATION: Education details: results of this assessment;  ST POC Person educated: Patient Education method: Explanation and Demonstration Education comprehension: verbalized understanding and needs further education     GOALS: Goals reviewed with patient? Yes  SHORT TERM GOALS: Target date: 10 sessions  Patient will lead discussion on a personally relevant topic for 10 minutes with appropriate language pragmatics >90% of the time with min cues.  Baseline: Goal status: INITIAL  2.  Patient will report engagement in cognitive activities outside of ST 4/7  days.  Baseline:  Goal status: INITIAL  3.  Patient will use external aids/strategies to organize thoughts and maintain topic when discussing a topic of interest for 10 minutes with min cues. Baseline:  Goal status: INITIAL   LONG TERM GOALS: Target date: 03/20/2021 Pt will report reduced impacts of cognitive status as measured by PRECiS (Patient Reported Evaluation of Cognitive Status).  Baseline:  Goal status: INITIAL  2.  Patient will demonstrate understanding of functional cognitive activities for home maintenance program with mod I Baseline:  Goal status: INITIAL  3.  Patient will demonstrate knowledge of appropriate activities to support cognitive and  language function outside of ST with assistance from family.  Baseline:  Goal status: INITIAL  ASSESSMENT:  CLINICAL IMPRESSION: Patient is a 71  y.o. male who was seen today for a formal cognitive communication evaluation. Pt presents with excerebration of chronic cognitive communication impairment in the absence of therapy. Pt presents with overall disheveled presentation, difficulty getting out of chair in lobby, significantly decreased balan ace when walking to SLP's office, wet vocal quality and overall decreased throughout organization. Pt reports that he has to "search for words or search for a different word because I can't say the original word." Pt's describes his oral motor abilities as a sensation of lingual fullness that increases with speech production. As such, pt's speech intelligibility was declined at ~ 80% at the simple conversation level d/t imprecise articulation and dysprosody. Pt was noted to be impulsive, recalled incorrect entrance to therapy as he reports going to wrong place and he demonstrated tendency to repeat himself with no awareness.     OBJECTIVE IMPAIRMENTS include memory, awareness, and executive functioning. These impairments are limiting patient from managing medications, managing appointments,  managing finances, household responsibilities, ADLs/IADLs, and effectively communicating at home and in community. Factors affecting potential to achieve goals and functional outcome are previous level of function and severity of impairments.. Patient will benefit from skilled SLP services to address above impairments and improve overall function.  REHAB POTENTIAL: Good  PLAN: SLP FREQUENCY: 1-2x/week  SLP DURATION: 12 weeks  PLANNED INTERVENTIONS: Cueing hierachy, Cognitive reorganization, Internal/external aids, Functional tasks, SLP instruction and feedback, Compensatory strategies, and Patient/family education   Hadasa Gasner B. Rutherford Nail, M.S., CCC-SLP, Mining engineer Certified Brain Injury [facility]  [place] Office [phone] Ascom [phone] Fax [phone]

## 2021-12-28 ENCOUNTER — Ambulatory Visit: Payer: Medicare PPO | Admitting: Physical Therapy

## 2021-12-28 ENCOUNTER — Ambulatory Visit: Payer: Medicare PPO | Admitting: Speech Pathology

## 2021-12-28 DIAGNOSIS — G3184 Mild cognitive impairment, so stated: Secondary | ICD-10-CM

## 2021-12-28 DIAGNOSIS — R41841 Cognitive communication deficit: Secondary | ICD-10-CM | POA: Diagnosis not present

## 2021-12-29 NOTE — Therapy (Signed)
OUTPATIENT SPEECH LANGUAGE PATHOLOGY TREATMENT NOTE   Patient Name: Vernon Payne. MRN: [id number] DOB:[birth year], 71 y.o., male Today's Date: 12/29/2021  PCP: Emily Filbert, MD REFERRING PROVIDER: Jennings Books, MD  END OF SESSION:   End of Session - 12/29/21 1237     Visit Number 2    Number of Visits 25    Date for SLP Re-Evaluation 03/20/22    Authorization Type [employer] PPO    Progress Note Due on Visit 10    SLP Start Time 1000    SLP Stop Time  1100    SLP Time Calculation (min) 60 min    Activity Tolerance Patient tolerated treatment well             Past Medical History:  Diagnosis Date   Actinic keratosis    Arthritis    Diabetes mellitus without complication (HCC)    GERD (gastroesophageal reflux disease)    Hard of hearing    left hearing aid   HLD (hyperlipidemia)    HTN (hypertension)    Hypotension, postural    IBS (irritable bowel syndrome)    Sleep apnea    Squamous cell carcinoma of skin 01/20/2010   Right chest. Well differentiated. Excised: 01/27/2010   Squamous cell carcinoma of skin 07/15/2019   R preauricular ant to lobe   Wears dentures    partial upper, implants on bottom   Past Surgical History:  Procedure Laterality Date   APPENDECTOMY     BACK SURGERY     lumbar   CATARACT EXTRACTION W/PHACO Right 08/29/2017   Procedure: CATARACT EXTRACTION PHACO AND INTRAOCULAR LENS PLACEMENT ([place]) RIGHT DIABETIC;  Surgeon: Leandrew Koyanagi, MD;  Location: [place];  Service: Ophthalmology;  Laterality: Right;  ISTENT INJECT   CATARACT EXTRACTION W/PHACO Left 11/21/2017   Procedure: CATARACT EXTRACTION PHACO AND INTRAOCULAR LENS PLACEMENT (IOC);  Surgeon: Leandrew Koyanagi, MD;  Location: [place];  Service: Ophthalmology;  Laterality: Left;   EYE SURGERY Left 11/21/2017   cataract excision   INSERTION OF ANTERIOR SEGMENT AQUEOUS DRAINAGE DEVICE (ISTENT) Right 08/29/2017   Procedure: (ISTENT);  Surgeon:  Leandrew Koyanagi, MD;  Location: [place];  Service: Ophthalmology;  Laterality: Right;  Diabetic - oral meds   INSERTION OF ANTERIOR SEGMENT AQUEOUS DRAINAGE DEVICE (ISTENT) Left 11/21/2017   Procedure: INSERTION OF ANTERIOR SEGMENT AQUEOUS DRAINAGE DEVICE ([place])  INJECT DIABETES ISTENT INJECT;  Surgeon: Leandrew Koyanagi, MD;  Location: [place];  Service: Ophthalmology;  Laterality: Left;  Diabetic - oral meds   JOINT REPLACEMENT     left knee replacement   KYPHOPLASTY N/A 03/28/2019   Procedure: L1 KYPHOPLASTY;  Surgeon: Hessie Knows, MD;  Location: [facility];  Service: Orthopedics;  Laterality: N/A;   LASIK     ROTATOR CUFF REPAIR     TONSILLECTOMY     Patient Active Problem List   Diagnosis Date Noted   Acute CVA (cerebrovascular accident) ([place])    Right sided weakness 07/20/2021   Primary osteoarthritis of right knee 09/04/2017   Primary osteoarthritis of left knee 09/04/2017   Status post left partial knee replacement 09/04/2017   Yellow jacket sting 10/31/2016   HTN (hypertension) 10/31/2016   Diabetes ([place]) 10/31/2016   HLD (hyperlipidemia) 10/31/2016    ONSET DATE: 07/20/2021; date of referral 12/20/2021   REFERRING DIAG: R41.89 (ICD-10-CM) - Other symptoms and signs involving cognitive functions and awareness    PERTINENT HISTORY: Vernon Payne. is a 71 y.o. male with medical  history significant of hypertension, diabetes, hyperlipidemia, sleep apnea comes to the emergency room after he started noticing right-sided weakness after he went to work. He started noticing decreased grip of his pen while writing and had on occasion slurred speech. He started filling heaviness on the right side called his wife and was brought to the emergency room on 07/20/2021. Pt received Outpatient ST services from 08/17/2021 thru 09/19/2021.    DIAGNOSTIC FINDINGS:  CT Head 07/20/2021: Normal head CT for age.   MRI brain 07/20/2021: Small acute infarct of the left  corona radiata extending toward the lentiform nucleus. Mild chronic microvascular ischemic changes.      CT Angio of head and neck 07/20/2021: No emergent large vessel occlusion. 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 Otherwise, patent intracranial vasculature. Mild calcified plaque at the carotid bifurcations without hemodynamically significant stenosis. Mild stenosis at the origin of the right vertebral artery.    ECHO 07/20/2021: Left ventricular ejection fraction, by estimation, is 55 to 60%. The left ventricle has normal function. The left ventricle has no regional wall motion abnormalities. Left ventricular diastolic parameters are consistent with Grade I diastolic dysfunction (impaired relaxation). Right ventricular systolic function is normal. The right ventricular size is normal. The mitral valve is normal in structure. Mild mitral valve regurgitation. No evidence of mitral stenosis. The aortic valve is normal in structure. Aortic valve regurgitation is not visualized. No aortic stenosis is present. The inferior vena cava is normal in size with greater than 50% respiratory variability, suggesting right atrial pressure of 3 mmHg.   THERAPY DIAG:  Cognitive communication deficit  Mild cognitive impairment  Rationale for Evaluation and Treatment Rehabilitation  SUBJECTIVE: pt pleasant, stated that he wore his hearing aids today  Pt accompanied by: self  PAIN:  Are you having pain? No  PATIENT GOALS: "to feel like myself again"  OBJECTIVE:   TODAY'S TREATMENT:  Skilled treatment session focused on pt's cognitive communication goals, specifically deductive reasoning. SLP facilitated session by providing the following interventions:   Pt stated that he wore his hearing aids today but they "bother" him. As such, he reports that he doesn't wear them often. Of note, pt's speech was [facility] throughout  the session today. Extensive skilled information provided on impact that hearing loss has on speech production and cognitive abilities. Pt places to make an appointment for full audiological evaluation and possibly get better hearing aids. Instructed pt to wear aids all day everyday for optimal benefit.       Semi-complex deductive reasoning puzzles [employer] 9: Verbal and Visual Reasoning; Verbal Reasoning-Convergent Reasoning - Min A cues for selective attention to puzzles along with more than a reasonable amount of time to complete    PATIENT EDUCATION: Education details: aided hearing Person educated: Patient Education method: Explanation and Verbal cues Education comprehension: verbalized understanding  HOME EXERCISE PROGRAM:  Deductive Reasoning Puzzles   GOALS: Goals reviewed with patient? Yes   SHORT TERM GOALS: Target date: 10 sessions   Patient will lead discussion on a personally relevant topic for 10 minutes with appropriate language pragmatics >90% of the time with min cues.  Baseline: Goal status: INITIAL   2.  Patient will report engagement in cognitive activities outside of ST 4/7 days.  Baseline:  Goal status: INITIAL   3.  Patient will use external aids/strategies to organize thoughts and maintain topic when discussing a topic of interest for 10  minutes with min cues. Baseline:  Goal status: INITIAL     LONG TERM GOALS: Target date: 03/20/2021 Pt will report reduced impacts of cognitive status as measured by PRECiS (Patient Reported Evaluation of Cognitive Status).  Baseline:  Goal status: INITIAL   2.  Patient will demonstrate understanding of functional cognitive activities for home maintenance program with mod I Baseline:  Goal status: INITIAL   3.  Patient will demonstrate knowledge of appropriate activities to support cognitive and  language function outside of ST with assistance from family.  Baseline:  Goal status: INITIAL  ASSESSMENT:  CLINICAL  IMPRESSION: Pt presents with excerebration of chronic cognitive communication impairment in the absence of skilled therapy. Specifically, pt's deficits lay in the areas of attention (divided and alternating), perception (interpretation of sensory information), organization (arranging ideas in a useful order), language (word finding deficits), processing speech (quick thinking and understanding), reasoning (logically thinking through situations) as well as metacognition and executive funcitoning.    OBJECTIVE IMPAIRMENTS include attention, memory, awareness, executive functioning, and expressive language. These impairments are limiting patient from managing medications, managing appointments, managing finances, household responsibilities, ADLs/IADLs, and effectively communicating at home and in community. Factors affecting potential to achieve goals and functional outcome are previous level of function and severity of impairments. Patient will benefit from skilled SLP services to address above impairments and improve overall function.  REHAB POTENTIAL: Good  PLAN: SLP FREQUENCY: 1-2x/week  SLP DURATION: 12 weeks  PLANNED INTERVENTIONS: Cueing hierachy, Cognitive reorganization, Internal/external aids, Functional tasks, SLP instruction and feedback, Compensatory strategies, and Patient/family education   Ahmeer Tuman B. Rutherford Nail, M.S., CCC-SLP, Mining engineer Certified Brain Injury [place]  [place] Office [phone] Ascom [phone] Fax [phone]

## 2021-12-30 ENCOUNTER — Encounter: Payer: Medicare PPO | Admitting: Occupational Therapy

## 2021-12-30 ENCOUNTER — Ambulatory Visit: Payer: Medicare PPO | Admitting: Physical Therapy

## 2022-01-03 ENCOUNTER — Ambulatory Visit: Payer: Medicare PPO | Admitting: Speech Pathology

## 2022-01-03 DIAGNOSIS — R41841 Cognitive communication deficit: Secondary | ICD-10-CM | POA: Diagnosis not present

## 2022-01-03 DIAGNOSIS — G3184 Mild cognitive impairment, so stated: Secondary | ICD-10-CM

## 2022-01-03 NOTE — Therapy (Signed)
OUTPATIENT SPEECH LANGUAGE PATHOLOGY TREATMENT NOTE   Patient Name: Vernon Payne. MRN: [id number] DOB:[birth year], 71 y.o., male Today's Date: 01/03/2022  PCP: Emily Filbert, MD REFERRING PROVIDER: Jennings Books, MD  END OF SESSION:   End of Session - 01/03/22 0952     Visit Number 3    Number of Visits 25    Date for SLP Re-Evaluation 03/20/22    Authorization Type [employer] Choice PPO    Progress Note Due on Visit 10    SLP Start Time 1000    SLP Stop Time  1100    SLP Time Calculation (min) 60 min    Activity Tolerance Patient tolerated treatment well             Past Medical History:  Diagnosis Date   Actinic keratosis    Arthritis    Diabetes mellitus without complication (HCC)    GERD (gastroesophageal reflux disease)    Hard of hearing    left hearing aid   HLD (hyperlipidemia)    HTN (hypertension)    Hypotension, postural    IBS (irritable bowel syndrome)    Sleep apnea    Squamous cell carcinoma of skin 01/20/2010   Right chest. Well differentiated. Excised: 01/27/2010   Squamous cell carcinoma of skin 07/15/2019   R preauricular ant to lobe   Wears dentures    partial upper, implants on bottom   Past Surgical History:  Procedure Laterality Date   APPENDECTOMY     BACK SURGERY     lumbar   CATARACT EXTRACTION W/PHACO Right 08/29/2017   Procedure: CATARACT EXTRACTION PHACO AND INTRAOCULAR LENS PLACEMENT ([place]) RIGHT DIABETIC;  Surgeon: Leandrew Koyanagi, MD;  Location: [place];  Service: Ophthalmology;  Laterality: Right;  ISTENT INJECT   CATARACT EXTRACTION W/PHACO Left 11/21/2017   Procedure: CATARACT EXTRACTION PHACO AND INTRAOCULAR LENS PLACEMENT ([facility]);  Surgeon: Leandrew Koyanagi, MD;  Location: [place];  Service: Ophthalmology;  Laterality: Left;   EYE SURGERY Left 11/21/2017   cataract excision   INSERTION OF ANTERIOR SEGMENT AQUEOUS DRAINAGE DEVICE (ISTENT) Right 08/29/2017   Procedure: (ISTENT);  Surgeon:  Leandrew Koyanagi, MD;  Location: [place];  Service: Ophthalmology;  Laterality: Right;  Diabetic - oral meds   INSERTION OF ANTERIOR SEGMENT AQUEOUS DRAINAGE DEVICE (ISTENT) Left 11/21/2017   Procedure: INSERTION OF ANTERIOR SEGMENT AQUEOUS DRAINAGE DEVICE ([place])  INJECT DIABETES ISTENT INJECT;  Surgeon: Leandrew Koyanagi, MD;  Location: [place];  Service: Ophthalmology;  Laterality: Left;  Diabetic - oral meds   JOINT REPLACEMENT     left knee replacement   KYPHOPLASTY N/A 03/28/2019   Procedure: L1 KYPHOPLASTY;  Surgeon: Hessie Knows, MD;  Location: [facility];  Service: Orthopedics;  Laterality: N/A;   LASIK     ROTATOR CUFF REPAIR     TONSILLECTOMY     Patient Active Problem List   Diagnosis Date Noted   Acute CVA (cerebrovascular accident) ([place])    Right sided weakness 07/20/2021   Primary osteoarthritis of right knee 09/04/2017   Primary osteoarthritis of left knee 09/04/2017   Status post left partial knee replacement 09/04/2017   Yellow jacket sting 10/31/2016   HTN (hypertension) 10/31/2016   Diabetes ([place]) 10/31/2016   HLD (hyperlipidemia) 10/31/2016    ONSET DATE: 07/20/2021; date of referral 12/20/2021   REFERRING DIAG: R41.89 (ICD-10-CM) - Other symptoms and signs involving cognitive functions and awareness    PERTINENT HISTORY: Vernon Payne. is a 70 y.o. male with medical  history significant of hypertension, diabetes, hyperlipidemia, sleep apnea comes to the emergency room after he started noticing right-sided weakness after he went to work. He started noticing decreased grip of his pen while writing and had on occasion slurred speech. He started filling heaviness on the right side called his wife and was brought to the emergency room on 07/20/2021. Pt received Outpatient ST services from 08/17/2021 thru 09/19/2021.    DIAGNOSTIC FINDINGS:  CT Head 07/20/2021: Normal head CT for age.   MRI brain 07/20/2021: Small acute infarct of the left  corona radiata extending toward the lentiform nucleus. Mild chronic microvascular ischemic changes.      CT Angio of head and neck 07/20/2021: No emergent large vessel occlusion. 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 Otherwise, patent intracranial vasculature. Mild calcified plaque at the carotid bifurcations without hemodynamically significant stenosis. Mild stenosis at the origin of the right vertebral artery.    ECHO 07/20/2021: Left ventricular ejection fraction, by estimation, is 55 to 60%. The left ventricle has normal function. The left ventricle has no regional wall motion abnormalities. Left ventricular diastolic parameters are consistent with Grade I diastolic dysfunction (impaired relaxation). Right ventricular systolic function is normal. The right ventricular size is normal. The mitral valve is normal in structure. Mild mitral valve regurgitation. No evidence of mitral stenosis. The aortic valve is normal in structure. Aortic valve regurgitation is not visualized. No aortic stenosis is present. The inferior vena cava is normal in size with greater than 50% respiratory variability, suggesting right atrial pressure of 3 mmHg.   THERAPY DIAG:  Cognitive communication deficit  Mild cognitive impairment  Rationale for Evaluation and Treatment Rehabilitation  SUBJECTIVE: pt pleasant, stated that he wore his hearing aids today  Pt accompanied by: self  PAIN:  Are you having pain? No  PATIENT GOALS: "to feel like myself again"  OBJECTIVE:   TODAY'S TREATMENT:  Skilled treatment session focused on pt's cognitive communication goals, specifically deductive reasoning. SLP facilitated session by providing the following interventions:   Semi-complex deductive reasoning puzzles [employer] 9: Verbal and Visual Reasoning; Verbal Reasoning-Convergent Reasoning - pt was Mod I for accurate completion of puzzles,  completed within reasonable amount of time as well  In addition, pt led personally relevant conversation for ~ 10 minutes with much improved cohesion and topic maintenance for ease of listeners   PATIENT EDUCATION: Education details: reasoning, attention and verbal organization Person educated: Patient Education method: Explanation and Verbal cues Education comprehension: verbalized understanding  HOME EXERCISE PROGRAM:  Deductive Reasoning Puzzles   GOALS: Goals reviewed with patient? Yes   SHORT TERM GOALS: Target date: 10 sessions   Patient will lead discussion on a personally relevant topic for 10 minutes with appropriate language pragmatics >90% of the time with min cues.  Baseline: Goal status: INITIAL   2.  Patient will report engagement in cognitive activities outside of ST 4/7 days.  Baseline:  Goal status: INITIAL   3.  Patient will use external aids/strategies to organize thoughts and maintain topic when discussing a topic of interest for 10 minutes with min cues. Baseline:  Goal status: INITIAL     LONG TERM GOALS: Target date: 03/20/2021 Pt will report reduced impacts of cognitive status as measured by PRECiS (Patient Reported Evaluation of Cognitive Status).  Baseline:  Goal status: INITIAL   2.  Patient will demonstrate understanding of functional cognitive activities for home maintenance program with  mod I Baseline:  Goal status: INITIAL   3.  Patient will demonstrate knowledge of appropriate activities to support cognitive and  language function outside of ST with assistance from family.  Baseline:  Goal status: INITIAL  ASSESSMENT:  CLINICAL IMPRESSION: Pt presents with excerebration of chronic cognitive communication impairment in the absence of skilled therapy. Specifically, pt's deficits lay in the areas of attention (divided and alternating), perception (interpretation of sensory information), organization (arranging ideas in a useful order),  language (word finding deficits), processing speech (quick thinking and understanding), reasoning (logically thinking through situations) as well as metacognition and executive functioning.    OBJECTIVE IMPAIRMENTS include attention, memory, awareness, executive functioning, and expressive language. These impairments are limiting patient from managing medications, managing appointments, managing finances, household responsibilities, ADLs/IADLs, and effectively communicating at home and in community. Factors affecting potential to achieve goals and functional outcome are previous level of function and severity of impairments. Patient will benefit from skilled SLP services to address above impairments and improve overall function.  REHAB POTENTIAL: Good  PLAN: SLP FREQUENCY: 1-2x/week  SLP DURATION: 12 weeks  PLANNED INTERVENTIONS: Cueing hierachy, Cognitive reorganization, Internal/external aids, Functional tasks, SLP instruction and feedback, Compensatory strategies, and Patient/family education   Guled Gahan B. Rutherford Nail, M.S., CCC-SLP, Mining engineer Certified Brain Injury [place]  [place] Office [phone] Ascom [phone] Fax [phone]

## 2022-01-04 ENCOUNTER — Ambulatory Visit: Payer: Medicare PPO | Admitting: Physical Therapy

## 2022-01-05 ENCOUNTER — Ambulatory Visit: Payer: Medicare PPO | Admitting: Speech Pathology

## 2022-01-05 DIAGNOSIS — R41841 Cognitive communication deficit: Secondary | ICD-10-CM

## 2022-01-05 DIAGNOSIS — G3184 Mild cognitive impairment, so stated: Secondary | ICD-10-CM

## 2022-01-05 NOTE — Therapy (Signed)
OUTPATIENT SPEECH LANGUAGE PATHOLOGY TREATMENT NOTE   Patient Name: Vernon Payne. MRN: [id number] DOB:1950/04/16, 71 y.o., male Today's Date: 01/05/2022   PCP: Emily Filbert, MD REFERRING PROVIDER: Jennings Books, MD   End of Session - 01/05/22 1633     Visit Number 4    Number of Visits 25    Date for SLP Re-Evaluation 03/20/22    Authorization Type [employer] Choice PPO    Progress Note Due on Visit 10    SLP Start Time 1000    SLP Stop Time  1100    SLP Time Calculation (min) 60 min    Activity Tolerance Patient tolerated treatment well               Past Medical History:  Diagnosis Date   Actinic keratosis    Arthritis    Diabetes mellitus without complication (HCC)    GERD (gastroesophageal reflux disease)    Hard of hearing    left hearing aid   HLD (hyperlipidemia)    HTN (hypertension)    Hypotension, postural    IBS (irritable bowel syndrome)    Sleep apnea    Squamous cell carcinoma of skin 01/20/2010   Right chest. Well differentiated. Excised: 01/27/2010   Squamous cell carcinoma of skin 07/15/2019   R preauricular ant to lobe   Wears dentures    partial upper, implants on bottom   Past Surgical History:  Procedure Laterality Date   APPENDECTOMY     BACK SURGERY     lumbar   CATARACT EXTRACTION W/PHACO Right 08/29/2017   Procedure: CATARACT EXTRACTION PHACO AND INTRAOCULAR LENS PLACEMENT ([place]) RIGHT DIABETIC;  Surgeon: Leandrew Koyanagi, MD;  Location: [facility];  Service: Ophthalmology;  Laterality: Right;  ISTENT INJECT   CATARACT EXTRACTION W/PHACO Left 11/21/2017   Procedure: CATARACT EXTRACTION PHACO AND INTRAOCULAR LENS PLACEMENT ([facility]);  Surgeon: Leandrew Koyanagi, MD;  Location: [facility];  Service: Ophthalmology;  Laterality: Left;   EYE SURGERY Left 11/21/2017   cataract excision   INSERTION OF ANTERIOR SEGMENT AQUEOUS DRAINAGE DEVICE (ISTENT) Right 08/29/2017   Procedure: (ISTENT);  Surgeon: Leandrew Koyanagi, MD;  Location: [facility];  Service: Ophthalmology;  Laterality: Right;  Diabetic - oral meds   INSERTION OF ANTERIOR SEGMENT AQUEOUS DRAINAGE DEVICE (ISTENT) Left 11/21/2017   Procedure: INSERTION OF ANTERIOR SEGMENT AQUEOUS DRAINAGE DEVICE ([place])  INJECT DIABETES ISTENT INJECT;  Surgeon: Leandrew Koyanagi, MD;  Location: [facility];  Service: Ophthalmology;  Laterality: Left;  Diabetic - oral meds   JOINT REPLACEMENT     left knee replacement   KYPHOPLASTY N/A 03/28/2019   Procedure: L1 KYPHOPLASTY;  Surgeon: Hessie Knows, MD;  Location: [facility];  Service: Orthopedics;  Laterality: N/A;   LASIK     ROTATOR CUFF REPAIR     TONSILLECTOMY     Patient Active Problem List   Diagnosis Date Noted   Acute CVA (cerebrovascular accident) ([place])    Right sided weakness 07/20/2021   Primary osteoarthritis of right knee 09/04/2017   Primary osteoarthritis of left knee 09/04/2017   Status post left partial knee replacement 09/04/2017   Yellow jacket sting 10/31/2016   HTN (hypertension) 10/31/2016   Diabetes ([place]) 10/31/2016   HLD (hyperlipidemia) 10/31/2016    ONSET DATE: 07/20/2021; date of referral 12/20/2021   REFERRING DIAG: R41.89 (ICD-10-CM) - Other symptoms and signs involving cognitive functions and awareness    PERTINENT HISTORY: Vernon Payne. is a 71 y.o. male with medical history  significant of hypertension, diabetes, hyperlipidemia, sleep apnea comes to the emergency room after he started noticing right-sided weakness after he went to work. He started noticing decreased grip of his pen while writing and had on occasion slurred speech. He started filling heaviness on the right side called his wife and was brought to the emergency room on 07/20/2021. Pt received Outpatient ST services from 08/17/2021 thru 09/19/2021.    DIAGNOSTIC FINDINGS:  CT Head 07/20/2021: Normal head CT for age.   MRI brain 07/20/2021: Small acute infarct of the left corona  radiata extending toward the lentiform nucleus. Mild chronic microvascular ischemic changes.      CT Angio of head and neck 07/20/2021: No emergent large vessel occlusion. 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 Otherwise, patent intracranial vasculature. Mild calcified plaque at the carotid bifurcations without hemodynamically significant stenosis. Mild stenosis at the origin of the right vertebral artery.    ECHO 07/20/2021: Left ventricular ejection fraction, by estimation, is 55 to 60%. The left ventricle has normal function. The left ventricle has no regional wall motion abnormalities. Left ventricular diastolic parameters are consistent with Grade I diastolic dysfunction (impaired relaxation). Right ventricular systolic function is normal. The right ventricular size is normal. The mitral valve is normal in structure. Mild mitral valve regurgitation. No evidence of mitral stenosis. The aortic valve is normal in structure. Aortic valve regurgitation is not visualized. No aortic stenosis is present. The inferior vena cava is normal in size with greater than 50% respiratory variability, suggesting right atrial pressure of 3 mmHg.   THERAPY DIAG:  Cognitive communication deficit  Mild cognitive impairment  Rationale for Evaluation and Treatment Rehabilitation  SUBJECTIVE: pt pleasant, stated that he wore his hearing aids today  Pt accompanied by: self  PAIN:  Are you having pain? No  PATIENT GOALS: "to feel like myself again"  OBJECTIVE:   TODAY'S TREATMENT:  Skilled treatment session focused on pt's cognitive communication goals, specifically deductive reasoning. SLP facilitated session by providing the following interventions:   Semi-complex 3x3 advanced to 4x4 grid puzzles with 2 constraints were presented to pt - pt initially benefited from cues to re-read directions prior to beginning 3x3 puzzles. Pt  able to complete with rare min A. When advanced to 4x4 grids, pt was noted to be impulsive, unable to sequence problem solving strategies and utilized process of elimination that decreased his effectiveness. SLP instructed pt in identifying rows/columns that had only one choice available as a starting place. Pt was not able to use this strategy effectively without maximal cuing.   PATIENT EDUCATION: Education details: reasoning, attention and Data processing manager Person educated: Patient Education method: Explanation and Verbal cues Education comprehension: verbalized understanding  HOME EXERCISE PROGRAM:  Deductive Reasoning Puzzles   GOALS: Goals reviewed with patient? Yes   SHORT TERM GOALS: Target date: 10 sessions   Patient will lead discussion on a personally relevant topic for 10 minutes with appropriate language pragmatics >90% of the time with min cues.  Baseline: Goal status: INITIAL   2.  Patient will report engagement in cognitive activities outside of ST 4/7 days.  Baseline:  Goal status: INITIAL   3.  Patient will use external aids/strategies to organize thoughts and maintain topic when discussing a topic of interest for 10 minutes with min cues. Baseline:  Goal status: INITIAL     LONG TERM GOALS: Target date: 03/20/2021 Pt will report reduced impacts of  cognitive status as measured by PRECiS (Patient Reported Evaluation of Cognitive Status).  Baseline:  Goal status: INITIAL   2.  Patient will demonstrate understanding of functional cognitive activities for home maintenance program with mod I Baseline:  Goal status: INITIAL   3.  Patient will demonstrate knowledge of appropriate activities to support cognitive and  language function outside of ST with assistance from family.  Baseline:  Goal status: INITIAL  ASSESSMENT:  CLINICAL IMPRESSION: Pt presents with excerebration of chronic cognitive communication impairment in the absence of skilled therapy.  Specifically, pt's deficits lay in the areas of attention (divided and alternating), perception (interpretation of sensory information), organization (arranging ideas in a useful order), language (word finding deficits), processing speech (quick thinking and understanding), reasoning (logically thinking through situations) as well as metacognition and executive functioning. It has been observed that pt has difficulty flexibly using strategies to decrease the effects of the above mentioned impairments.    OBJECTIVE IMPAIRMENTS include attention, memory, awareness, executive functioning, and expressive language. These impairments are limiting patient from managing medications, managing appointments, managing finances, household responsibilities, ADLs/IADLs, and effectively communicating at home and in community. Factors affecting potential to achieve goals and functional outcome are previous level of function and severity of impairments. Patient will benefit from skilled SLP services to address above impairments and improve overall function.  REHAB POTENTIAL: Good  PLAN: SLP FREQUENCY: 1-2x/week  SLP DURATION: 12 weeks  PLANNED INTERVENTIONS: Cueing hierachy, Cognitive reorganization, Internal/external aids, Functional tasks, SLP instruction and feedback, Compensatory strategies, and Patient/family education   Vernon Payne B. Rutherford Nail, M.S., CCC-SLP, Mining engineer Certified Brain Injury [place]  [place] Office [phone] Ascom [phone] Fax [phone]

## 2022-01-06 ENCOUNTER — Ambulatory Visit: Payer: Medicare PPO | Admitting: Physical Therapy

## 2022-01-09 ENCOUNTER — Ambulatory Visit: Payer: Medicare PPO | Admitting: Speech Pathology

## 2022-01-09 DIAGNOSIS — R41841 Cognitive communication deficit: Secondary | ICD-10-CM | POA: Diagnosis not present

## 2022-01-09 DIAGNOSIS — G3184 Mild cognitive impairment, so stated: Secondary | ICD-10-CM

## 2022-01-09 NOTE — Therapy (Signed)
OUTPATIENT SPEECH LANGUAGE PATHOLOGY TREATMENT NOTE   Patient Name: Vernon Payne. MRN: [id number] DOB:[birth year], 71 y.o., male Today's Date: 01/09/2022   PCP: Emily Filbert, MD REFERRING PROVIDER: Jennings Books, MD   End of Session - 01/09/22 1236     Visit Number 5    Number of Visits 25    Date for SLP Re-Evaluation 03/20/22    Authorization Type [employer] Choice PPO    Progress Note Due on Visit 10    SLP Start Time 0900    SLP Stop Time  1000    SLP Time Calculation (min) 60 min    Activity Tolerance Patient tolerated treatment well                   Past Medical History:  Diagnosis Date   Actinic keratosis    Arthritis    Diabetes mellitus without complication (HCC)    GERD (gastroesophageal reflux disease)    Hard of hearing    left hearing aid   HLD (hyperlipidemia)    HTN (hypertension)    Hypotension, postural    IBS (irritable bowel syndrome)    Sleep apnea    Squamous cell carcinoma of skin 01/20/2010   Right chest. Well differentiated. Excised: 01/27/2010   Squamous cell carcinoma of skin 07/15/2019   R preauricular ant to lobe   Wears dentures    partial upper, implants on bottom   Past Surgical History:  Procedure Laterality Date   APPENDECTOMY     BACK SURGERY     lumbar   CATARACT EXTRACTION W/PHACO Right 08/29/2017   Procedure: CATARACT EXTRACTION PHACO AND INTRAOCULAR LENS PLACEMENT ([place]) RIGHT DIABETIC;  Surgeon: Leandrew Koyanagi, MD;  Location: [facility];  Service: Ophthalmology;  Laterality: Right;  ISTENT INJECT   CATARACT EXTRACTION W/PHACO Left 11/21/2017   Procedure: CATARACT EXTRACTION PHACO AND INTRAOCULAR LENS PLACEMENT ([facility]);  Surgeon: Leandrew Koyanagi, MD;  Location: [facility];  Service: Ophthalmology;  Laterality: Left;   EYE SURGERY Left 11/21/2017   cataract excision   INSERTION OF ANTERIOR SEGMENT AQUEOUS DRAINAGE DEVICE (ISTENT) Right 08/29/2017   Procedure: (ISTENT);  Surgeon:  Leandrew Koyanagi, MD;  Location: [facility];  Service: Ophthalmology;  Laterality: Right;  Diabetic - oral meds   INSERTION OF ANTERIOR SEGMENT AQUEOUS DRAINAGE DEVICE (ISTENT) Left 11/21/2017   Procedure: INSERTION OF ANTERIOR SEGMENT AQUEOUS DRAINAGE DEVICE ([place])  INJECT DIABETES ISTENT INJECT;  Surgeon: Leandrew Koyanagi, MD;  Location: [facility];  Service: Ophthalmology;  Laterality: Left;  Diabetic - oral meds   JOINT REPLACEMENT     left knee replacement   KYPHOPLASTY N/A 03/28/2019   Procedure: L1 KYPHOPLASTY;  Surgeon: Hessie Knows, MD;  Location: [facility];  Service: Orthopedics;  Laterality: N/A;   LASIK     ROTATOR CUFF REPAIR     TONSILLECTOMY     Patient Active Problem List   Diagnosis Date Noted   Acute CVA (cerebrovascular accident) ([place])    Right sided weakness 07/20/2021   Primary osteoarthritis of right knee 09/04/2017   Primary osteoarthritis of left knee 09/04/2017   Status post left partial knee replacement 09/04/2017   Yellow jacket sting 10/31/2016   HTN (hypertension) 10/31/2016   Diabetes ([place]) 10/31/2016   HLD (hyperlipidemia) 10/31/2016    ONSET DATE: 07/20/2021; date of referral 12/20/2021   REFERRING DIAG: R41.89 (ICD-10-CM) - Other symptoms and signs involving cognitive functions and awareness    PERTINENT HISTORY: Vernon Payne. is a 71 y.o.  male with medical history significant of hypertension, diabetes, hyperlipidemia, sleep apnea comes to the emergency room after he started noticing right-sided weakness after he went to work. He started noticing decreased grip of his pen while writing and had on occasion slurred speech. He started filling heaviness on the right side called his wife and was brought to the emergency room on 07/20/2021. Pt received Outpatient ST services from 08/17/2021 thru 09/19/2021.    DIAGNOSTIC FINDINGS:  CT Head 07/20/2021: Normal head CT for age.   MRI brain 07/20/2021: Small acute infarct of the left  corona radiata extending toward the lentiform nucleus. Mild chronic microvascular ischemic changes.      CT Angio of head and neck 07/20/2021: No emergent large vessel occlusion. 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 Otherwise, patent intracranial vasculature. Mild calcified plaque at the carotid bifurcations without hemodynamically significant stenosis. Mild stenosis at the origin of the right vertebral artery.    ECHO 07/20/2021: Left ventricular ejection fraction, by estimation, is 55 to 60%. The left ventricle has normal function. The left ventricle has no regional wall motion abnormalities. Left ventricular diastolic parameters are consistent with Grade I diastolic dysfunction (impaired relaxation). Right ventricular systolic function is normal. The right ventricular size is normal. The mitral valve is normal in structure. Mild mitral valve regurgitation. No evidence of mitral stenosis. The aortic valve is normal in structure. Aortic valve regurgitation is not visualized. No aortic stenosis is present. The inferior vena cava is normal in size with greater than 50% respiratory variability, suggesting right atrial pressure of 3 mmHg.   THERAPY DIAG:  Cognitive communication deficit  Mild cognitive impairment  Rationale for Evaluation and Treatment Rehabilitation  SUBJECTIVE: pt pleasant, telling about new furniture that had been delivered Pt accompanied by: self  PAIN:  Are you having pain? No  PATIENT GOALS: "to feel like myself again"  OBJECTIVE:   TODAY'S TREATMENT:  Skilled treatment session focused on pt's cognitive communication goals, specifically deductive reasoning. SLP facilitated session by providing the following interventions:      Pt was Mod I for use of selective attention strategies, memory strategies and emergent awareness during a semi-complex problem solving game.    PATIENT  EDUCATION: Education details: reasoning, attention and Data processing manager Person educated: Patient Education method: Explanation and Verbal cues Education comprehension: verbalized understanding  HOME EXERCISE PROGRAM:  Deductive Reasoning Puzzles   GOALS: Goals reviewed with patient? Yes   SHORT TERM GOALS: Target date: 10 sessions   Patient will lead discussion on a personally relevant topic for 10 minutes with appropriate language pragmatics >90% of the time with min cues.  Baseline: Goal status: INITIAL   2.  Patient will report engagement in cognitive activities outside of ST 4/7 days.  Baseline:  Goal status: INITIAL   3.  Patient will use external aids/strategies to organize thoughts and maintain topic when discussing a topic of interest for 10 minutes with min cues. Baseline:  Goal status: INITIAL     LONG TERM GOALS: Target date: 03/20/2021 Pt will report reduced impacts of cognitive status as measured by PRECiS (Patient Reported Evaluation of Cognitive Status).  Baseline:  Goal status: INITIAL   2.  Patient will demonstrate understanding of functional cognitive activities for home maintenance program with mod I Baseline:  Goal status: INITIAL   3.  Patient will demonstrate knowledge of appropriate activities to support cognitive and  language function outside of  ST with assistance from family.  Baseline:  Goal status: INITIAL  ASSESSMENT:  CLINICAL IMPRESSION: Pt presents with excerebration of chronic cognitive communication impairment in the absence of skilled therapy. Specifically, pt's deficits lay in the areas of attention (divided and alternating), perception (interpretation of sensory information), organization (arranging ideas in a useful order), language (word finding deficits), processing speech (quick thinking and understanding), reasoning (logically thinking through situations) as well as metacognition and executive functioning. It has been observed  that pt has difficulty flexibly using strategies to decrease the effects of the above mentioned impairments.    OBJECTIVE IMPAIRMENTS include attention, memory, awareness, executive functioning, and expressive language. These impairments are limiting patient from managing medications, managing appointments, managing finances, household responsibilities, ADLs/IADLs, and effectively communicating at home and in community. Factors affecting potential to achieve goals and functional outcome are previous level of function and severity of impairments. Patient will benefit from skilled SLP services to address above impairments and improve overall function.  REHAB POTENTIAL: Good  PLAN: SLP FREQUENCY: 1-2x/week  SLP DURATION: 12 weeks  PLANNED INTERVENTIONS: Cueing hierachy, Cognitive reorganization, Internal/external aids, Functional tasks, SLP instruction and feedback, Compensatory strategies, and Patient/family education   Elham Fini B. Rutherford Nail, M.S., CCC-SLP, Mining engineer Certified Brain Injury [place]  [place] Office [phone] Ascom [phone] Fax [phone]

## 2022-01-11 ENCOUNTER — Ambulatory Visit: Payer: Medicare PPO | Admitting: Physical Therapy

## 2022-01-16 ENCOUNTER — Ambulatory Visit: Payer: Medicare PPO | Admitting: Speech Pathology

## 2022-01-16 DIAGNOSIS — R41841 Cognitive communication deficit: Secondary | ICD-10-CM | POA: Diagnosis not present

## 2022-01-16 DIAGNOSIS — G3184 Mild cognitive impairment, so stated: Secondary | ICD-10-CM

## 2022-01-16 NOTE — Therapy (Signed)
OUTPATIENT SPEECH LANGUAGE PATHOLOGY TREATMENT NOTE   Patient Name: Vernon Payne. MRN: [id number] DOB:08/31/1950, 71 y.o., male Today's Date: 01/16/2022   PCP: Emily Filbert, MD REFERRING PROVIDER: Jennings Books, MD   End of Session - 01/16/22 [id number]     Visit Number 6    Number of Visits 25    Date for SLP Re-Evaluation 03/20/22    Authorization Type [employer] PPO    Progress Note Due on Visit 10    SLP Start Time 0900    SLP Stop Time  1000    SLP Time Calculation (min) 60 min    Activity Tolerance Patient tolerated treatment well                   Past Medical History:  Diagnosis Date   Actinic keratosis    Arthritis    Diabetes mellitus without complication (HCC)    GERD (gastroesophageal reflux disease)    Hard of hearing    left hearing aid   HLD (hyperlipidemia)    HTN (hypertension)    Hypotension, postural    IBS (irritable bowel syndrome)    Sleep apnea    Squamous cell carcinoma of skin 01/20/2010   Right chest. Well differentiated. Excised: 01/27/2010   Squamous cell carcinoma of skin 07/15/2019   R preauricular ant to lobe   Wears dentures    partial upper, implants on bottom   Past Surgical History:  Procedure Laterality Date   APPENDECTOMY     BACK SURGERY     lumbar   CATARACT EXTRACTION W/PHACO Right 08/29/2017   Procedure: CATARACT EXTRACTION PHACO AND INTRAOCULAR LENS PLACEMENT ([place]) RIGHT DIABETIC;  Surgeon: Leandrew Koyanagi, MD;  Location: [facility];  Service: Ophthalmology;  Laterality: Right;  ISTENT INJECT   CATARACT EXTRACTION W/PHACO Left 11/21/2017   Procedure: CATARACT EXTRACTION PHACO AND INTRAOCULAR LENS PLACEMENT ([facility]);  Surgeon: Leandrew Koyanagi, MD;  Location: [facility];  Service: Ophthalmology;  Laterality: Left;   EYE SURGERY Left 11/21/2017   cataract excision   INSERTION OF ANTERIOR SEGMENT AQUEOUS DRAINAGE DEVICE (ISTENT) Right 08/29/2017   Procedure: (ISTENT);  Surgeon:  Leandrew Koyanagi, MD;  Location: [facility];  Service: Ophthalmology;  Laterality: Right;  Diabetic - oral meds   INSERTION OF ANTERIOR SEGMENT AQUEOUS DRAINAGE DEVICE (ISTENT) Left 11/21/2017   Procedure: INSERTION OF ANTERIOR SEGMENT AQUEOUS DRAINAGE DEVICE ([place])  INJECT DIABETES ISTENT INJECT;  Surgeon: Leandrew Koyanagi, MD;  Location: [facility];  Service: Ophthalmology;  Laterality: Left;  Diabetic - oral meds   JOINT REPLACEMENT     left knee replacement   KYPHOPLASTY N/A 03/28/2019   Procedure: L1 KYPHOPLASTY;  Surgeon: Hessie Knows, MD;  Location: [facility];  Service: Orthopedics;  Laterality: N/A;   LASIK     ROTATOR CUFF REPAIR     TONSILLECTOMY     Patient Active Problem List   Diagnosis Date Noted   Acute CVA (cerebrovascular accident) ([place])    Right sided weakness 07/20/2021   Primary osteoarthritis of right knee 09/04/2017   Primary osteoarthritis of left knee 09/04/2017   Status post left partial knee replacement 09/04/2017   Yellow jacket sting 10/31/2016   HTN (hypertension) 10/31/2016   Diabetes ([place]) 10/31/2016   HLD (hyperlipidemia) 10/31/2016    ONSET DATE: 07/20/2021; date of referral 12/20/2021   REFERRING DIAG: R41.89 (ICD-10-CM) - Other symptoms and signs involving cognitive functions and awareness    PERTINENT HISTORY: Vernon Payne. is a 71 y.o.  male with medical history significant of hypertension, diabetes, hyperlipidemia, sleep apnea comes to the emergency room after he started noticing right-sided weakness after he went to work. He started noticing decreased grip of his pen while writing and had on occasion slurred speech. He started filling heaviness on the right side called his wife and was brought to the emergency room on 07/20/2021. Pt received Outpatient ST services from 08/17/2021 thru 09/19/2021.    DIAGNOSTIC FINDINGS:  CT Head 07/20/2021: Normal head CT for age.   MRI brain 07/20/2021: Small acute infarct of the left  corona radiata extending toward the lentiform nucleus. Mild chronic microvascular ischemic changes.      CT Angio of head and neck 07/20/2021: No emergent large vessel occlusion. 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 Otherwise, patent intracranial vasculature. Mild calcified plaque at the carotid bifurcations without hemodynamically significant stenosis. Mild stenosis at the origin of the right vertebral artery.    ECHO 07/20/2021: Left ventricular ejection fraction, by estimation, is 55 to 60%. The left ventricle has normal function. The left ventricle has no regional wall motion abnormalities. Left ventricular diastolic parameters are consistent with Grade I diastolic dysfunction (impaired relaxation). Right ventricular systolic function is normal. The right ventricular size is normal. The mitral valve is normal in structure. Mild mitral valve regurgitation. No evidence of mitral stenosis. The aortic valve is normal in structure. Aortic valve regurgitation is not visualized. No aortic stenosis is present. The inferior vena cava is normal in size with greater than 50% respiratory variability, suggesting right atrial pressure of 3 mmHg.   THERAPY DIAG:  Cognitive communication deficit  Mild cognitive impairment  Rationale for Evaluation and Treatment Rehabilitation  SUBJECTIVE: pt pleasant, talking about getting his CDLs Pt accompanied by: self  PAIN:  Are you having pain? No  PATIENT GOALS: "to feel like myself again"  OBJECTIVE:   TODAY'S TREATMENT:  Skilled treatment session focused on pt's cognitive communication goals, specifically deductive reasoning. SLP facilitated session by providing the following interventions:       Cognitive Linguistic Quick Test  AGE - 70-89   The Cognitive Linguistic Quick Test (CLQT) was administered to assess the relative status of five cognitive domains: attention,  memory, language, executive functioning, and visuospatial skills. Scores from 10 tasks were used to estimate severity ratings (standardized for age groups 18-69 years and 70-89 years) for each domain, a clock drawing task, as well as an overall composite severity rating of cognition.       Task Score Criterion Cut Scores  Personal Facts 8/8 8  Symbol Cancellation 12/12 10  Confrontation Naming 10/10 10  Clock Drawing  13/13 11  Story Retelling 7/10 5  Symbol Trails 8/10 6  Generative Naming 8/9 4  Design Memory 6/6 4  Mazes  8/8 4  Design Generation 10/13 5    Cognitive Domain Composite Score Severity Rating  Attention 200/215 WNL  Memory 166/185 WNL  Executive Function 34/40 WNL  Language 33/37 WNL  Visuospatial Skills 98/105 WNL  Clock Drawing  13/13 WNL  Composite Severity Rating  WNL     PATIENT EDUCATION: Education details: reasoning, attention and verbal organization Person educated: Patient Education method: Explanation and Verbal cues Education comprehension: verbalized understanding  HOME EXERCISE PROGRAM:  Deductive Reasoning Puzzles   GOALS: Goals reviewed with patient? Yes   SHORT TERM GOALS: Target date: 10 sessions   Patient will lead discussion on a  personally relevant topic for 10 minutes with appropriate language pragmatics >90% of the time with min cues.  Baseline: Goal status: INITIAL   2.  Patient will report engagement in cognitive activities outside of ST 4/7 days.  Baseline:  Goal status: INITIAL   3.  Patient will use external aids/strategies to organize thoughts and maintain topic when discussing a topic of interest for 10 minutes with min cues. Baseline:  Goal status: INITIAL     LONG TERM GOALS: Target date: 03/20/2021 Pt will report reduced impacts of cognitive status as measured by PRECiS (Patient Reported Evaluation of Cognitive Status).  Baseline:  Goal status: INITIAL   2.  Patient will demonstrate understanding of functional  cognitive activities for home maintenance program with mod I Baseline:  Goal status: INITIAL   3.  Patient will demonstrate knowledge of appropriate activities to support cognitive and  language function outside of ST with assistance from family.  Baseline:  Goal status: INITIAL  ASSESSMENT:  CLINICAL IMPRESSION: Pt presents with excerebration of chronic cognitive communication impairment in the absence of skilled therapy. Specifically, pt's deficits lay in the areas of attention (divided and alternating), perception (interpretation of sensory information), organization (arranging ideas in a useful order), language (word finding deficits), processing speech (quick thinking and understanding), reasoning (logically thinking through situations) as well as metacognition and executive functioning. It has been observed that pt has difficulty flexibly using strategies to decrease the effects of the above mentioned impairments.   While pt presents with the above deficits, when compared to his peers, his cognitive communication abilities are considered to be within the normal limits.    OBJECTIVE IMPAIRMENTS include attention, memory, awareness, executive functioning, and expressive language. These impairments are limiting patient from managing medications, managing appointments, managing finances, household responsibilities, ADLs/IADLs, and effectively communicating at home and in community. Factors affecting potential to achieve goals and functional outcome are previous level of function and severity of impairments. Patient will benefit from skilled SLP services to address above impairments and improve overall function.  REHAB POTENTIAL: Good  PLAN: SLP FREQUENCY: 1-2x/week  SLP DURATION: 12 weeks  PLANNED INTERVENTIONS: Cueing hierachy, Cognitive reorganization, Internal/external aids, Functional tasks, SLP instruction and feedback, Compensatory strategies, and Patient/family education   Shakenya Stoneberg  B. Rutherford Nail, M.S., CCC-SLP, Mining engineer Certified Brain Injury [place]  [place] Office [phone] Ascom [phone] Fax [phone]

## 2022-01-18 ENCOUNTER — Encounter: Payer: Medicare PPO | Admitting: Occupational Therapy

## 2022-01-18 ENCOUNTER — Ambulatory Visit: Payer: Medicare PPO | Admitting: Physical Therapy

## 2022-01-19 ENCOUNTER — Ambulatory Visit: Payer: Medicare PPO | Admitting: Speech Pathology

## 2022-01-19 DIAGNOSIS — R41841 Cognitive communication deficit: Secondary | ICD-10-CM | POA: Diagnosis not present

## 2022-01-19 DIAGNOSIS — G3184 Mild cognitive impairment, so stated: Secondary | ICD-10-CM

## 2022-01-20 ENCOUNTER — Ambulatory Visit: Payer: Medicare PPO

## 2022-01-20 NOTE — Therapy (Signed)
OUTPATIENT SPEECH LANGUAGE PATHOLOGY TREATMENT NOTE   Patient Name: Vernon Payne. MRN: [id number] DOB:1950-03-30, 71 y.o., male Today's Date: 01/20/2022   PCP: Vernon Filbert, MD REFERRING PROVIDER: Jennings Books, MD   End of Session - 01/20/22 0754     Visit Number 7    Number of Visits 25    Date for SLP Re-Evaluation 03/20/22    Authorization Type [employer] Choice PPO    Progress Note Due on Visit 10    SLP Start Time 0915    SLP Stop Time  1000    SLP Time Calculation (min) 45 min    Activity Tolerance Patient tolerated treatment well                   Past Medical History:  Diagnosis Date   Actinic keratosis    Arthritis    Diabetes mellitus without complication (HCC)    GERD (gastroesophageal reflux disease)    Hard of hearing    left hearing aid   HLD (hyperlipidemia)    HTN (hypertension)    Hypotension, postural    IBS (irritable bowel syndrome)    Sleep apnea    Squamous cell carcinoma of skin 01/20/2010   Right chest. Well differentiated. Excised: 01/27/2010   Squamous cell carcinoma of skin 07/15/2019   R preauricular ant to lobe   Wears dentures    partial upper, implants on bottom   Past Surgical History:  Procedure Laterality Date   APPENDECTOMY     BACK SURGERY     lumbar   CATARACT EXTRACTION W/PHACO Right 08/29/2017   Procedure: CATARACT EXTRACTION PHACO AND INTRAOCULAR LENS PLACEMENT ([place]) RIGHT DIABETIC;  Surgeon: Vernon Koyanagi, MD;  Location: [facility];  Service: Ophthalmology;  Laterality: Right;  ISTENT INJECT   CATARACT EXTRACTION W/PHACO Left 11/21/2017   Procedure: CATARACT EXTRACTION PHACO AND INTRAOCULAR LENS PLACEMENT ([facility]);  Surgeon: Vernon Koyanagi, MD;  Location: [facility];  Service: Ophthalmology;  Laterality: Left;   EYE SURGERY Left 11/21/2017   cataract excision   INSERTION OF ANTERIOR SEGMENT AQUEOUS DRAINAGE DEVICE (ISTENT) Right 08/29/2017   Procedure: (ISTENT);  Surgeon:  Vernon Koyanagi, MD;  Location: [facility];  Service: Ophthalmology;  Laterality: Right;  Diabetic - oral meds   INSERTION OF ANTERIOR SEGMENT AQUEOUS DRAINAGE DEVICE (ISTENT) Left 11/21/2017   Procedure: INSERTION OF ANTERIOR SEGMENT AQUEOUS DRAINAGE DEVICE ([place])  INJECT DIABETES ISTENT INJECT;  Surgeon: Vernon Koyanagi, MD;  Location: [facility];  Service: Ophthalmology;  Laterality: Left;  Diabetic - oral meds   JOINT REPLACEMENT     left knee replacement   KYPHOPLASTY N/A 03/28/2019   Procedure: L1 KYPHOPLASTY;  Surgeon: Vernon Knows, MD;  Location: [facility];  Service: Orthopedics;  Laterality: N/A;   LASIK     ROTATOR CUFF REPAIR     TONSILLECTOMY     Patient Active Problem List   Diagnosis Date Noted   Acute CVA (cerebrovascular accident) ([place])    Right sided weakness 07/20/2021   Primary osteoarthritis of right knee 09/04/2017   Primary osteoarthritis of left knee 09/04/2017   Status post left partial knee replacement 09/04/2017   Yellow jacket sting 10/31/2016   HTN (hypertension) 10/31/2016   Diabetes ([place]) 10/31/2016   HLD (hyperlipidemia) 10/31/2016    ONSET DATE: 07/20/2021; date of referral 12/20/2021   REFERRING DIAG: R41.89 (ICD-10-CM) - Other symptoms and signs involving cognitive functions and awareness    PERTINENT HISTORY: Vernon Payne. is a 71 y.o.  male with medical history significant of hypertension, diabetes, hyperlipidemia, sleep apnea comes to the emergency room after he started noticing right-sided weakness after he went to work. He started noticing decreased grip of his pen while writing and had on occasion slurred speech. He started filling heaviness on the right side called his wife and was brought to the emergency room on 07/20/2021. Pt received Outpatient ST services from 08/17/2021 thru 09/19/2021.    DIAGNOSTIC FINDINGS:  CT Head 07/20/2021: Normal head CT for age.   MRI brain 07/20/2021: Small acute infarct of the left  corona radiata extending toward the lentiform nucleus. Mild chronic microvascular ischemic changes.      CT Angio of head and neck 07/20/2021: No emergent large vessel occlusion. 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 Otherwise, patent intracranial vasculature. Mild calcified plaque at the carotid bifurcations without hemodynamically significant stenosis. Mild stenosis at the origin of the right vertebral artery.    ECHO 07/20/2021: Left ventricular ejection fraction, by estimation, is 55 to 60%. The left ventricle has normal function. The left ventricle has no regional wall motion abnormalities. Left ventricular diastolic parameters are consistent with Grade I diastolic dysfunction (impaired relaxation). Right ventricular systolic function is normal. The right ventricular size is normal. The mitral valve is normal in structure. Mild mitral valve regurgitation. No evidence of mitral stenosis. The aortic valve is normal in structure. Aortic valve regurgitation is not visualized. No aortic stenosis is present. The inferior vena cava is normal in size with greater than 50% respiratory variability, suggesting right atrial pressure of 3 mmHg.   THERAPY DIAG:  Mild cognitive impairment  Cognitive communication deficit  Rationale for Evaluation and Treatment Rehabilitation  SUBJECTIVE: pt pleasant, talking about getting his CDLs Pt accompanied by: self  PAIN:  Are you having pain? No  PATIENT GOALS: "to feel like myself again"  OBJECTIVE:   TODAY'S TREATMENT:  Skilled treatment session focused on pt's cognitive communication goals, specifically deductive reasoning. SLP facilitated session by providing the following interventions:       Cognitive Linguistic Quick Test  AGE - 70-89   The Cognitive Linguistic Quick Test (CLQT) was administered to assess the relative status of five cognitive domains: attention,  memory, language, executive functioning, and visuospatial skills. Scores from 10 tasks were used to estimate severity ratings (standardized for age groups 18-69 years and 70-89 years) for each domain, a clock drawing task, as well as an overall composite severity rating of cognition.       Task Score Criterion Cut Scores  Personal Facts 8/8 8  Symbol Cancellation 12/12 10  Confrontation Naming 10/10 10  Clock Drawing  13/13 11  Story Retelling 7/10 5  Symbol Trails 8/10 6  Generative Naming 8/9 4  Design Memory 6/6 4  Mazes  8/8 4  Design Generation 10/13 5    Cognitive Domain Composite Score Severity Rating  Attention 200/215 WNL  Memory 166/185 WNL  Executive Function 34/40 WNL  Language 33/37 WNL  Visuospatial Skills 98/105 WNL  Clock Drawing  13/13 WNL  Composite Severity Rating  WNL     PATIENT EDUCATION: Education details: reasoning, attention and verbal organization Person educated: Patient Education method: Explanation and Verbal cues Education comprehension: verbalized understanding  HOME EXERCISE PROGRAM:  Deductive Reasoning Puzzles   GOALS: Goals reviewed with patient? Yes   SHORT TERM GOALS: Target date: 10 sessions   Patient will lead discussion on a  personally relevant topic for 10 minutes with appropriate language pragmatics >90% of the time with min cues.  Baseline: Goal status: INITIAL   2.  Patient will report engagement in cognitive activities outside of ST 4/7 days.  Baseline:  Goal status: INITIAL   3.  Patient will use external aids/strategies to organize thoughts and maintain topic when discussing a topic of interest for 10 minutes with min cues. Baseline:  Goal status: INITIAL     LONG TERM GOALS: Target date: 03/20/2021 Pt will report reduced impacts of cognitive status as measured by PRECiS (Patient Reported Evaluation of Cognitive Status).  Baseline:  Goal status: INITIAL   2.  Patient will demonstrate understanding of functional  cognitive activities for home maintenance program with mod I Baseline:  Goal status: INITIAL   3.  Patient will demonstrate knowledge of appropriate activities to support cognitive and  language function outside of ST with assistance from family.  Baseline:  Goal status: INITIAL  ASSESSMENT:  CLINICAL IMPRESSION: Pt presents with excerebration of chronic cognitive communication impairment in the absence of skilled therapy. Specifically, pt's deficits lay in the areas of attention (divided and alternating), perception (interpretation of sensory information), organization (arranging ideas in a useful order), language (word finding deficits), processing speech (quick thinking and understanding), reasoning (logically thinking through situations) as well as metacognition and executive functioning. It has been observed that pt has difficulty flexibly using strategies to decrease the effects of the above mentioned impairments.   While pt presents with the above deficits, when compared to his peers, his cognitive communication abilities are considered to be within the normal limits.    OBJECTIVE IMPAIRMENTS include attention, memory, awareness, executive functioning, and expressive language. These impairments are limiting patient from managing medications, managing appointments, managing finances, household responsibilities, ADLs/IADLs, and effectively communicating at home and in community. Factors affecting potential to achieve goals and functional outcome are previous level of function and severity of impairments. Patient will benefit from skilled SLP services to address above impairments and improve overall function.  REHAB POTENTIAL: Good  PLAN: SLP FREQUENCY: 1-2x/week  SLP DURATION: 12 weeks  PLANNED INTERVENTIONS: Cueing hierachy, Cognitive reorganization, Internal/external aids, Functional tasks, SLP instruction and feedback, Compensatory strategies, and Patient/family education   Clarrissa Shimkus  B. Rutherford Nail, M.S., CCC-SLP, Mining engineer Certified Brain Injury [place]  [employer] Office [phone] Ascom [phone] Fax [phone]

## 2022-01-23 ENCOUNTER — Ambulatory Visit: Payer: Medicare PPO | Admitting: Speech Pathology

## 2022-01-25 ENCOUNTER — Ambulatory Visit: Payer: Medicare PPO

## 2022-01-26 ENCOUNTER — Ambulatory Visit: Payer: Medicare PPO | Attending: Neurology | Admitting: Speech Pathology

## 2022-01-26 DIAGNOSIS — I639 Cerebral infarction, unspecified: Secondary | ICD-10-CM | POA: Insufficient documentation

## 2022-01-26 DIAGNOSIS — R41841 Cognitive communication deficit: Secondary | ICD-10-CM | POA: Diagnosis present

## 2022-01-26 DIAGNOSIS — G3184 Mild cognitive impairment, so stated: Secondary | ICD-10-CM | POA: Insufficient documentation

## 2022-01-27 ENCOUNTER — Ambulatory Visit: Payer: Medicare PPO

## 2022-01-27 ENCOUNTER — Ambulatory Visit: Payer: Medicare PPO | Admitting: Speech Pathology

## 2022-01-27 DIAGNOSIS — R41841 Cognitive communication deficit: Secondary | ICD-10-CM

## 2022-01-27 DIAGNOSIS — G3184 Mild cognitive impairment, so stated: Secondary | ICD-10-CM

## 2022-01-30 ENCOUNTER — Ambulatory Visit: Payer: Medicare PPO | Admitting: Speech Pathology

## 2022-01-31 NOTE — Therapy (Signed)
OUTPATIENT SPEECH LANGUAGE PATHOLOGY TREATMENT NOTE   Patient Name: Vernon Payne. MRN: [id number] DOB:1950/12/07, 71 y.o., male Today's Date: 01/31/2022   PCP: Emily Filbert, MD REFERRING PROVIDER: Jennings Books, MD   End of Session - 01/31/22 1325     Visit Number 8    Number of Visits 25    Date for SLP Re-Evaluation 03/20/22    Authorization Type [employer] PPO    Progress Note Due on Visit 10    SLP Start Time 1000    SLP Stop Time  1100    SLP Time Calculation (min) 60 min    Activity Tolerance Patient tolerated treatment well                   Past Medical History:  Diagnosis Date   Actinic keratosis    Arthritis    Diabetes mellitus without complication (HCC)    GERD (gastroesophageal reflux disease)    Hard of hearing    left hearing aid   HLD (hyperlipidemia)    HTN (hypertension)    Hypotension, postural    IBS (irritable bowel syndrome)    Sleep apnea    Squamous cell carcinoma of skin 01/20/2010   Right chest. Well differentiated. Excised: 01/27/2010   Squamous cell carcinoma of skin 07/15/2019   R preauricular ant to lobe   Wears dentures    partial upper, implants on bottom   Past Surgical History:  Procedure Laterality Date   APPENDECTOMY     BACK SURGERY     lumbar   CATARACT EXTRACTION W/PHACO Right 08/29/2017   Procedure: CATARACT EXTRACTION PHACO AND INTRAOCULAR LENS PLACEMENT ([place]) RIGHT DIABETIC;  Surgeon: Leandrew Koyanagi, MD;  Location: [place];  Service: Ophthalmology;  Laterality: Right;  ISTENT INJECT   CATARACT EXTRACTION W/PHACO Left 11/21/2017   Procedure: CATARACT EXTRACTION PHACO AND INTRAOCULAR LENS PLACEMENT ([facility]);  Surgeon: Leandrew Koyanagi, MD;  Location: [place] [facility];  Service: Ophthalmology;  Laterality: Left;   EYE SURGERY Left 11/21/2017   cataract excision   INSERTION OF ANTERIOR SEGMENT AQUEOUS DRAINAGE DEVICE (ISTENT) Right 08/29/2017   Procedure: (ISTENT);  Surgeon:  Leandrew Koyanagi, MD;  Location: [facility];  Service: Ophthalmology;  Laterality: Right;  Diabetic - oral meds   INSERTION OF ANTERIOR SEGMENT AQUEOUS DRAINAGE DEVICE (ISTENT) Left 11/21/2017   Procedure: INSERTION OF ANTERIOR SEGMENT AQUEOUS DRAINAGE DEVICE ([place])  INJECT DIABETES ISTENT INJECT;  Surgeon: Leandrew Koyanagi, MD;  Location: [place];  Service: Ophthalmology;  Laterality: Left;  Diabetic - oral meds   JOINT REPLACEMENT     left knee replacement   KYPHOPLASTY N/A 03/28/2019   Procedure: L1 KYPHOPLASTY;  Surgeon: Hessie Knows, MD;  Location: [facility];  Service: Orthopedics;  Laterality: N/A;   LASIK     ROTATOR CUFF REPAIR     TONSILLECTOMY     Patient Active Problem List   Diagnosis Date Noted   Acute CVA (cerebrovascular accident) ([place])    Right sided weakness 07/20/2021   Primary osteoarthritis of right knee 09/04/2017   Primary osteoarthritis of left knee 09/04/2017   Status post left partial knee replacement 09/04/2017   Yellow jacket sting 10/31/2016   HTN (hypertension) 10/31/2016   Diabetes ([place]) 10/31/2016   HLD (hyperlipidemia) 10/31/2016    ONSET DATE: 07/20/2021; date of referral 12/20/2021   REFERRING DIAG: R41.89 (ICD-10-CM) - Other symptoms and signs involving cognitive functions and awareness    PERTINENT HISTORY: Vernon Payne. is a 71 y.o.  male with medical history significant of hypertension, diabetes, hyperlipidemia, sleep apnea comes to the emergency room after he started noticing right-sided weakness after he went to work. He started noticing decreased grip of his pen while writing and had on occasion slurred speech. He started filling heaviness on the right side called his wife and was brought to the emergency room on 07/20/2021. Pt received Outpatient ST services from 08/17/2021 thru 09/19/2021.    DIAGNOSTIC FINDINGS:  CT Head 07/20/2021: Normal head CT for age.   MRI brain 07/20/2021: Small acute infarct of the left  corona radiata extending toward the lentiform nucleus. Mild chronic microvascular ischemic changes.      CT Angio of head and neck 07/20/2021: No emergent large vessel occlusion. 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 Otherwise, patent intracranial vasculature. Mild calcified plaque at the carotid bifurcations without hemodynamically significant stenosis. Mild stenosis at the origin of the right vertebral artery.    ECHO 07/20/2021: Left ventricular ejection fraction, by estimation, is 55 to 60%. The left ventricle has normal function. The left ventricle has no regional wall motion abnormalities. Left ventricular diastolic parameters are consistent with Grade I diastolic dysfunction (impaired relaxation). Right ventricular systolic function is normal. The right ventricular size is normal. The mitral valve is normal in structure. Mild mitral valve regurgitation. No evidence of mitral stenosis. The aortic valve is normal in structure. Aortic valve regurgitation is not visualized. No aortic stenosis is present. The inferior vena cava is normal in size with greater than 50% respiratory variability, suggesting right atrial pressure of 3 mmHg.   THERAPY DIAG:  Cognitive communication deficit  Mild cognitive impairment  Rationale for Evaluation and Treatment Rehabilitation  SUBJECTIVE: pt pleasant  Pt accompanied by: self  PAIN:  Are you having pain? No  PATIENT GOALS: "to feel like myself again"  OBJECTIVE:   TODAY'S TREATMENT:  Skilled treatment session focused on pt's cognitive communication goals, specifically deductive reasoning. SLP facilitated session by providing the following interventions:      Pt arrived to session with improved insight stating, "I realize that I don't react as quickly as before." He describes a recent miscommunication between himself and his wife. His wife arrived to session shortly  after session began and provides clarification. It became apparent that there were many potential cognitive communication deficits present. SLP attempted to make each deficit more tangable by writing each down. Pt had moderate to max difficulty with topic maintanance as he was frequently tangential and joking. However, he was not able to recall detail that was being discussed. Specifically, SLP instructed pt to write down list of questions concerned in Webb conversation with his wife. Pt was resistant to writing anything down. At baseline, he didn't have to write information down. However, in making his list of questions, SLP was then able to understand conversationl information, whereas, pt lack organized sequential thought processes when initially describing conversation to SLP.   In addition, SLP had difficulty understanding pt's description of going out of town and possibly rescheduling his audiology appt d/t tangential statements, decreased organization.   Didn't have hearing aids in, misunderstood what was said x 4 throughout entire session resulting in misunderstanding.     PATIENT EDUCATION: Education details: reasoning, attention and Data processing manager Person educated: Patient Education method: Explanation and Verbal cues Education comprehension: verbalized understanding  HOME EXERCISE PROGRAM:  Deductive Reasoning Puzzles   GOALS: Goals reviewed with patient? Yes  SHORT TERM GOALS: Target date: 10 sessions   Patient will lead discussion on a personally relevant topic for 10 minutes with appropriate language pragmatics >90% of the time with min cues.  Baseline: Goal status: INITIAL   2.  Patient will report engagement in cognitive activities outside of ST 4/7 days.  Baseline:  Goal status: INITIAL   3.  Patient will use external aids/strategies to organize thoughts and maintain topic when discussing a topic of interest for 10 minutes with min cues. Baseline:   Goal status: INITIAL     LONG TERM GOALS: Target date: 03/20/2021 Pt will report reduced impacts of cognitive status as measured by PRECiS (Patient Reported Evaluation of Cognitive Status).  Baseline:  Goal status: INITIAL   2.  Patient will demonstrate understanding of functional cognitive activities for home maintenance program with mod I Baseline:  Goal status: INITIAL   3.  Patient will demonstrate knowledge of appropriate activities to support cognitive and  language function outside of ST with assistance from family.  Baseline:  Goal status: INITIAL  ASSESSMENT:  CLINICAL IMPRESSION: Pt presents with excerebration of chronic cognitive communication impairment in the absence of skilled therapy. Specifically, pt's deficits lay in the areas of attention (divided and alternating), perception (interpretation of sensory information), organization (arranging ideas in a useful order), language (word finding deficits), processing speech (quick thinking and understanding), reasoning (logically thinking through situations) as well as metacognition and executive functioning. It has been observed that pt has difficulty flexibly using strategies to decrease the effects of the above mentioned impairments.   While pt presents with the above deficits, when compared to his peers, his cognitive communication abilities are considered to be below the normal limits.    OBJECTIVE IMPAIRMENTS include attention, memory, awareness, executive functioning, and expressive language. These impairments are limiting patient from managing medications, managing appointments, managing finances, household responsibilities, ADLs/IADLs, and effectively communicating at home and in community. Factors affecting potential to achieve goals and functional outcome are previous level of function and severity of impairments. Patient will benefit from skilled SLP services to address above impairments and improve overall  function.  REHAB POTENTIAL: Good  PLAN: SLP FREQUENCY: 1-2x/week  SLP DURATION: 12 weeks  PLANNED INTERVENTIONS: Cueing hierachy, Cognitive reorganization, Internal/external aids, Functional tasks, SLP instruction and feedback, Compensatory strategies, and Patient/family education   Amadeo Coke B. Rutherford Nail, M.S., CCC-SLP, Mining engineer Certified Brain Injury [place]  [place] Office [phone] Ascom [phone] Fax [phone]

## 2022-01-31 NOTE — Therapy (Signed)
OUTPATIENT SPEECH LANGUAGE PATHOLOGY TREATMENT NOTE   Patient Name: Vernon Payne. MRN: [id number] DOB:December 21, 1950, 71 y.o., male Today's Date: 01/31/2022   PCP: Emily Filbert, MD REFERRING PROVIDER: Jennings Books, MD   End of Session - 01/31/22 1341     Visit Number 9    Number of Visits 25    Date for SLP Re-Evaluation 03/20/22    Authorization Type [employer] Choice PPO    Authorization Time Period 12/27/2021 thru 03/17/2022    Authorization - Visit Number 8    Authorization - Number of Visits 25    Progress Note Due on Visit 10    SLP Start Time 0800    SLP Stop Time  0900    SLP Time Calculation (min) 60 min    Activity Tolerance Patient tolerated treatment well                   Past Medical History:  Diagnosis Date   Actinic keratosis    Arthritis    Diabetes mellitus without complication (HCC)    GERD (gastroesophageal reflux disease)    Hard of hearing    left hearing aid   HLD (hyperlipidemia)    HTN (hypertension)    Hypotension, postural    IBS (irritable bowel syndrome)    Sleep apnea    Squamous cell carcinoma of skin 01/20/2010   Right chest. Well differentiated. Excised: 01/27/2010   Squamous cell carcinoma of skin 07/15/2019   R preauricular ant to lobe   Wears dentures    partial upper, implants on bottom   Past Surgical History:  Procedure Laterality Date   APPENDECTOMY     BACK SURGERY     lumbar   CATARACT EXTRACTION W/PHACO Right 08/29/2017   Procedure: CATARACT EXTRACTION PHACO AND INTRAOCULAR LENS PLACEMENT ([place]) RIGHT DIABETIC;  Surgeon: Leandrew Koyanagi, MD;  Location: [place];  Service: Ophthalmology;  Laterality: Right;  ISTENT INJECT   CATARACT EXTRACTION W/PHACO Left 11/21/2017   Procedure: CATARACT EXTRACTION PHACO AND INTRAOCULAR LENS PLACEMENT ([facility]);  Surgeon: Leandrew Koyanagi, MD;  Location: [place];  Service: Ophthalmology;  Laterality: Left;   EYE SURGERY Left 11/21/2017   cataract  excision   INSERTION OF ANTERIOR SEGMENT AQUEOUS DRAINAGE DEVICE (ISTENT) Right 08/29/2017   Procedure: (ISTENT);  Surgeon: Leandrew Koyanagi, MD;  Location: [place];  Service: Ophthalmology;  Laterality: Right;  Diabetic - oral meds   INSERTION OF ANTERIOR SEGMENT AQUEOUS DRAINAGE DEVICE (ISTENT) Left 11/21/2017   Procedure: INSERTION OF ANTERIOR SEGMENT AQUEOUS DRAINAGE DEVICE ([place])  INJECT DIABETES ISTENT INJECT;  Surgeon: Leandrew Koyanagi, MD;  Location: [place];  Service: Ophthalmology;  Laterality: Left;  Diabetic - oral meds   JOINT REPLACEMENT     left knee replacement   KYPHOPLASTY N/A 03/28/2019   Procedure: L1 KYPHOPLASTY;  Surgeon: Hessie Knows, MD;  Location: [facility];  Service: Orthopedics;  Laterality: N/A;   LASIK     ROTATOR CUFF REPAIR     TONSILLECTOMY     Patient Active Problem List   Diagnosis Date Noted   Acute CVA (cerebrovascular accident) ([place])    Right sided weakness 07/20/2021   Primary osteoarthritis of right knee 09/04/2017   Primary osteoarthritis of left knee 09/04/2017   Status post left partial knee replacement 09/04/2017   Yellow jacket sting 10/31/2016   HTN (hypertension) 10/31/2016   Diabetes ([place]) 10/31/2016   HLD (hyperlipidemia) 10/31/2016    ONSET DATE: 07/20/2021; date of referral 12/20/2021   REFERRING  DIAG: R41.89 (ICD-10-CM) - Other symptoms and signs involving cognitive functions and awareness    PERTINENT HISTORY: Vernon Treml. is a 71 y.o. male with medical history significant of hypertension, diabetes, hyperlipidemia, sleep apnea comes to the emergency room after he started noticing right-sided weakness after he went to work. He started noticing decreased grip of his pen while writing and had on occasion slurred speech. He started filling heaviness on the right side called his wife and was brought to the emergency room on 07/20/2021. Pt received Outpatient ST services from 08/17/2021 thru 09/19/2021.     DIAGNOSTIC FINDINGS:  CT Head 07/20/2021: Normal head CT for age.   MRI brain 07/20/2021: Small acute infarct of the left corona radiata extending toward the lentiform nucleus. Mild chronic microvascular ischemic changes.      CT Angio of head and neck 07/20/2021: No emergent large vessel occlusion. 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 Otherwise, patent intracranial vasculature. Mild calcified plaque at the carotid bifurcations without hemodynamically significant stenosis. Mild stenosis at the origin of the right vertebral artery.    ECHO 07/20/2021: Left ventricular ejection fraction, by estimation, is 55 to 60%. The left ventricle has normal function. The left ventricle has no regional wall motion abnormalities. Left ventricular diastolic parameters are consistent with Grade I diastolic dysfunction (impaired relaxation). Right ventricular systolic function is normal. The right ventricular size is normal. The mitral valve is normal in structure. Mild mitral valve regurgitation. No evidence of mitral stenosis. The aortic valve is normal in structure. Aortic valve regurgitation is not visualized. No aortic stenosis is present. The inferior vena cava is normal in size with greater than 50% respiratory variability, suggesting right atrial pressure of 3 mmHg.   THERAPY DIAG:  Cognitive communication deficit  Mild cognitive impairment  Rationale for Evaluation and Treatment Rehabilitation  SUBJECTIVE: pt pleasant  Pt accompanied by: self  PAIN:  Are you having pain? No  PATIENT GOALS: "to feel like myself again"  OBJECTIVE:   TODAY'S TREATMENT:  Skilled treatment session focused on pt's cognitive communication goals, specifically deductive reasoning. SLP facilitated session by providing the following interventions:      Throughout the session, pt was resistance to recommendations to use external aids such  as writing information during down during conversations with his wife, information during appts (such as his upcoming audiology appt). His impairment in emergent awareness of memory deficits resulted in inaccurate recall of information which when corrected pt appeared very irritated. In addition, Pt's tangential comments and joking were a barrier to topic maintanence. Finally, pt was not wearing his hearing aids which resulted in misunderstanding 3 times thorughout session    PATIENT EDUCATION: Education details: reasoning, attention and verbal organization Person educated: Patient Education method: Explanation and Verbal cues Education comprehension: verbalized understanding  HOME EXERCISE PROGRAM:  Deductive Reasoning Puzzles   GOALS: Goals reviewed with patient? Yes   SHORT TERM GOALS: Target date: 10 sessions   Patient will lead discussion on a personally relevant topic for 10 minutes with appropriate language pragmatics >90% of the time with min cues.  Baseline: Goal status: INITIAL   2.  Patient will report engagement in cognitive activities outside of ST 4/7 days.  Baseline:  Goal status: INITIAL   3.  Patient will use external aids/strategies to organize thoughts and maintain topic when discussing a topic of interest for 10 minutes with min cues. Baseline:  Goal  status: INITIAL     LONG TERM GOALS: Target date: 03/20/2021 Pt will report reduced impacts of cognitive status as measured by PRECiS (Patient Reported Evaluation of Cognitive Status).  Baseline:  Goal status: INITIAL   2.  Patient will demonstrate understanding of functional cognitive activities for home maintenance program with mod I Baseline:  Goal status: INITIAL   3.  Patient will demonstrate knowledge of appropriate activities to support cognitive and  language function outside of ST with assistance from family.  Baseline:  Goal status: INITIAL  ASSESSMENT:  CLINICAL IMPRESSION: Pt presents with  excerebration of chronic cognitive communication impairment in the absence of skilled therapy. Specifically, pt's deficits lay in the areas of attention (divided and alternating), perception (interpretation of sensory information), organization (arranging ideas in a useful order), language (word finding deficits), processing speech (quick thinking and understanding), reasoning (logically thinking through situations) as well as metacognition and executive functioning. It has been observed that pt has difficulty flexibly using strategies to decrease the effects of the above mentioned impairments.    OBJECTIVE IMPAIRMENTS include attention, memory, awareness, executive functioning, and expressive language. These impairments are limiting patient from managing medications, managing appointments, managing finances, household responsibilities, ADLs/IADLs, and effectively communicating at home and in community. Factors affecting potential to achieve goals and functional outcome are previous level of function and severity of impairments. Patient will benefit from skilled SLP services to address above impairments and improve overall function.  REHAB POTENTIAL: Good  PLAN: SLP FREQUENCY: 1-2x/week  SLP DURATION: 12 weeks  PLANNED INTERVENTIONS: Cueing hierachy, Cognitive reorganization, Internal/external aids, Functional tasks, SLP instruction and feedback, Compensatory strategies, and Patient/family education   Ahmiyah Coil B. Rutherford Nail, M.S., CCC-SLP, Mining engineer Certified Brain Injury [place]  [place] Office [phone] Ascom [phone] Fax [phone]

## 2022-02-01 ENCOUNTER — Ambulatory Visit: Payer: Medicare PPO | Admitting: Physical Therapy

## 2022-02-01 ENCOUNTER — Ambulatory Visit: Payer: Medicare PPO | Admitting: Speech Pathology

## 2022-02-01 DIAGNOSIS — R41841 Cognitive communication deficit: Secondary | ICD-10-CM | POA: Diagnosis not present

## 2022-02-01 DIAGNOSIS — I639 Cerebral infarction, unspecified: Secondary | ICD-10-CM

## 2022-02-01 NOTE — Therapy (Signed)
OUTPATIENT SPEECH LANGUAGE PATHOLOGY TREATMENT NOTE 10TH SESSION PROGRESS NOTE   Patient Name: Vernon Payne. MRN: [id number] DOB:[birth year], 71 y.o., male Today's Date: 02/01/2022   PCP: Vernon Filbert, MD REFERRING PROVIDER: Jennings Books, MD  Speech Therapy Progress Note  Dates of Reporting Period: 12/26/2021 to 02/01/2022  Objective: Patient has been seen for 10 speech therapy sessions this reporting period targeting pt's mild cognitive communication impairment. Patient is making progress toward LTGs and met 2 STGs this reporting period. See skilled intervention, clinical impressions, and goals below for details.    End of Session - 02/01/22 1436     Visit Number 10    Number of Visits 25    Date for SLP Re-Evaluation 03/20/22    Authorization Type [employer] PPO    Authorization Time Period 12/27/2021 thru 03/17/2022    Authorization - Visit Number 10    Authorization - Number of Visits 25    Progress Note Due on Visit 41    SLP Start Time 1100    SLP Stop Time  1200    SLP Time Calculation (min) 60 min    Activity Tolerance Patient tolerated treatment well                   Past Medical History:  Diagnosis Date   Actinic keratosis    Arthritis    Diabetes mellitus without complication (HCC)    GERD (gastroesophageal reflux disease)    Hard of hearing    left hearing aid   HLD (hyperlipidemia)    HTN (hypertension)    Hypotension, postural    IBS (irritable bowel syndrome)    Sleep apnea    Squamous cell carcinoma of skin 01/20/2010   Right chest. Well differentiated. Excised: 01/27/2010   Squamous cell carcinoma of skin 07/15/2019   R preauricular ant to lobe   Wears dentures    partial upper, implants on bottom   Past Surgical History:  Procedure Laterality Date   APPENDECTOMY     BACK SURGERY     lumbar   CATARACT EXTRACTION W/PHACO Right 08/29/2017   Procedure: CATARACT EXTRACTION PHACO AND INTRAOCULAR LENS PLACEMENT ([facility]) RIGHT  DIABETIC;  Surgeon: Vernon Koyanagi, MD;  Location: [facility];  Service: Ophthalmology;  Laterality: Right;  ISTENT INJECT   CATARACT EXTRACTION W/PHACO Left 11/21/2017   Procedure: CATARACT EXTRACTION PHACO AND INTRAOCULAR LENS PLACEMENT ([facility]);  Surgeon: Vernon Koyanagi, MD;  Location: [facility];  Service: Ophthalmology;  Laterality: Left;   EYE SURGERY Left 11/21/2017   cataract excision   INSERTION OF ANTERIOR SEGMENT AQUEOUS DRAINAGE DEVICE (ISTENT) Right 08/29/2017   Procedure: (ISTENT);  Surgeon: Vernon Koyanagi, MD;  Location: [facility];  Service: Ophthalmology;  Laterality: Right;  Diabetic - oral meds   INSERTION OF ANTERIOR SEGMENT AQUEOUS DRAINAGE DEVICE (ISTENT) Left 11/21/2017   Procedure: INSERTION OF ANTERIOR SEGMENT AQUEOUS DRAINAGE DEVICE ([facility])  INJECT DIABETES ISTENT INJECT;  Surgeon: Vernon Koyanagi, MD;  Location: [facility];  Service: Ophthalmology;  Laterality: Left;  Diabetic - oral meds   JOINT REPLACEMENT     left knee replacement   KYPHOPLASTY N/A 03/28/2019   Procedure: L1 KYPHOPLASTY;  Surgeon: Hessie Knows, MD;  Location: [facility];  Service: Orthopedics;  Laterality: N/A;   LASIK     ROTATOR CUFF REPAIR     TONSILLECTOMY     Patient Active Problem List   Diagnosis Date Noted   Acute CVA (cerebrovascular accident) ([place])    Right sided  weakness 07/20/2021   Primary osteoarthritis of right knee 09/04/2017   Primary osteoarthritis of left knee 09/04/2017   Status post left partial knee replacement 09/04/2017   Yellow jacket sting 10/31/2016   HTN (hypertension) 10/31/2016   Diabetes ([place]) 10/31/2016   HLD (hyperlipidemia) 10/31/2016    ONSET DATE: 07/20/2021; date of referral 12/20/2021   REFERRING DIAG: R41.89 (ICD-10-CM) - Other symptoms and signs involving cognitive functions and awareness    PERTINENT HISTORY: Vernon Payne. is a 71 y.o. male with medical history significant of hypertension,  diabetes, hyperlipidemia, sleep apnea comes to the emergency room after he started noticing right-sided weakness after he went to work. He started noticing decreased grip of his pen while writing and had on occasion slurred speech. He started filling heaviness on the right side called his wife and was brought to the emergency room on 07/20/2021. Pt received Outpatient ST services from 08/17/2021 thru 09/19/2021.    DIAGNOSTIC FINDINGS:  CT Head 07/20/2021: Normal head CT for age.   MRI brain 07/20/2021: Small acute infarct of the left corona radiata extending toward the lentiform nucleus. Mild chronic microvascular ischemic changes.      CT Angio of head and neck 07/20/2021: No emergent large vessel occlusion. 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 Otherwise, patent intracranial vasculature. Mild calcified plaque at the carotid bifurcations without hemodynamically significant stenosis. Mild stenosis at the origin of the right vertebral artery.    ECHO 07/20/2021: Left ventricular ejection fraction, by estimation, is 55 to 60%. The left ventricle has normal function. The left ventricle has no regional wall motion abnormalities. Left ventricular diastolic parameters are consistent with Grade I diastolic dysfunction (impaired relaxation). Right ventricular systolic function is normal. The right ventricular size is normal. The mitral valve is normal in structure. Mild mitral valve regurgitation. No evidence of mitral stenosis. The aortic valve is normal in structure. Aortic valve regurgitation is not visualized. No aortic stenosis is present. The inferior vena cava is normal in size with greater than 50% respiratory variability, suggesting right atrial pressure of 3 mmHg.   THERAPY DIAG:  Acute CVA (cerebrovascular accident) [facility])  Cognitive communication deficit  Rationale for Evaluation and Treatment  Rehabilitation  SUBJECTIVE: pt pleasant  Pt accompanied by: self  PAIN:  Are you having pain? No  PATIENT GOALS: "to feel like myself again"  OBJECTIVE:   TODAY'S TREATMENT:  Skilled treatment session focused on pt's cognitive communication goals, specifically deductive reasoning. SLP facilitated session by providing the following interventions:      Pt and his wife report that Audiologist stated pt's hearing aids were broken and she will be sending them to the manufacturers for report with repeat audiogram scheduled for January 10th at 4:15pm. Per audiologist, pt has not been properly aided for some time. SLP further facilitated session by requesting that pt and his wife each separately list deficits post CVA. Pt's wife listed the following deficits: walking, balance, strength, hearing statements, following conversations, short temper, shorter frustration, endurance, sleep less during the day/more at night. Pt listed the following deficits: walking, balanace, decreased sarcasism, more emotional, food doesn't taste the same, more frequent IBS, decreased sexual desire, "tongue feels swallen more than usual in conversation" especially at night ore when tired, endurance and strength.   Skilled education provided on pt's functional speech intelligibility even at the complex abstract conversational level.   Education provided on placing pt on hold  until his hearing aids are serviced to assess if that helps with pt's ability to follow conversation. Pt and his wife agreeable with putting pt on hold until Jan. 19th.    PATIENT EDUCATION: Education details: reasoning, attention and Data processing manager Person educated: Patient Education method: Explanation and Verbal cues Education comprehension: verbalized understanding  HOME EXERCISE PROGRAM:  Deductive Reasoning Puzzles   GOALS: Goals reviewed with patient? Yes   SHORT TERM GOALS: Target date: 10 sessions    02/01/2022 - 10th  treatment note Patient will lead discussion on a personally relevant topic for 10 minutes with appropriate language pragmatics >90% of the time with min cues.  Baseline: Goal status: INITIAL 02/01/2022 Goal Status: MET   2.  Patient will report engagement in cognitive activities outside of ST 4/7 days.  Baseline:  Goal status: INITIAL 02/01/2022 Goal Status: MET   3.  Patient will use external aids/strategies to organize thoughts and maintain topic when discussing a topic of interest for 10 minutes with min cues. Baseline:  Goal status: INITIAL 02/01/2022 Goal Status: ONGOING     LONG TERM GOALS: Target date: 03/20/2021 Pt will report reduced impacts of cognitive status as measured by PRECiS (Patient Reported Evaluation of Cognitive Status).  Baseline:  Goal status: ONGOING   2.  Patient will demonstrate understanding of functional cognitive activities for home maintenance program with mod I Baseline:  Goal status: ONGOING   3.  Patient will demonstrate knowledge of appropriate activities to support cognitive and  language function outside of ST with assistance from family.  Baseline:  Goal status: ONGOING  ASSESSMENT:  CLINICAL IMPRESSION: Pt presents with excerebration of chronic cognitive communication impairment in the absence of skilled therapy. Specifically, pt's deficits lay in the areas of attention (divided and alternating), perception (interpretation of sensory information), organization (arranging ideas in a useful order), language (word finding deficits), processing speech (quick thinking and understanding), reasoning (logically thinking through situations) as well as metacognition and executive functioning. It has been observed that pt has difficulty flexibly using strategies to decrease the effects of the above mentioned impairments.    OBJECTIVE IMPAIRMENTS include attention, memory, awareness, executive functioning, and expressive language. These impairments are  limiting patient from managing medications, managing appointments, managing finances, household responsibilities, ADLs/IADLs, and effectively communicating at home and in community. Factors affecting potential to achieve goals and functional outcome are previous level of function and severity of impairments. Patient will benefit from skilled SLP services to address above impairments and improve overall function.  REHAB POTENTIAL: Good  PLAN: SLP FREQUENCY: 1-2x/week  SLP DURATION: 12 weeks  PLANNED INTERVENTIONS: Cueing hierachy, Cognitive reorganization, Internal/external aids, Functional tasks, SLP instruction and feedback, Compensatory strategies, and Patient/family education   Melora Menon B. Rutherford Nail, M.S., CCC-SLP, Mining engineer Certified Brain Injury [place]  [place] Office [phone] Ascom [phone] Fax [phone]

## 2022-02-03 ENCOUNTER — Ambulatory Visit: Payer: Medicare PPO | Admitting: Speech Pathology

## 2022-02-03 ENCOUNTER — Ambulatory Visit: Payer: Medicare PPO

## 2022-02-06 ENCOUNTER — Encounter: Payer: Medicare PPO | Admitting: Speech Pathology

## 2022-02-06 ENCOUNTER — Ambulatory Visit: Payer: Medicare PPO | Admitting: Speech Pathology

## 2022-02-08 ENCOUNTER — Ambulatory Visit: Payer: Medicare PPO | Admitting: Physical Therapy

## 2022-02-08 ENCOUNTER — Encounter: Payer: Medicare PPO | Admitting: Speech Pathology

## 2022-02-10 ENCOUNTER — Ambulatory Visit: Payer: Medicare PPO

## 2022-02-15 ENCOUNTER — Ambulatory Visit: Payer: Medicare PPO | Admitting: Physical Therapy

## 2022-02-17 ENCOUNTER — Ambulatory Visit: Payer: Medicare PPO

## 2022-03-06 ENCOUNTER — Encounter: Payer: Medicare PPO | Admitting: Speech Pathology

## 2022-03-08 ENCOUNTER — Encounter: Payer: Medicare PPO | Admitting: Speech Pathology

## 2022-03-10 ENCOUNTER — Ambulatory Visit: Payer: Medicare PPO | Admitting: Speech Pathology

## 2022-03-13 ENCOUNTER — Ambulatory Visit: Payer: Medicare PPO | Admitting: Speech Pathology

## 2022-03-15 ENCOUNTER — Ambulatory Visit: Payer: Medicare PPO | Admitting: Speech Pathology

## 2022-03-20 ENCOUNTER — Encounter: Payer: Medicare PPO | Admitting: Speech Pathology

## 2022-03-22 ENCOUNTER — Encounter: Payer: Medicare PPO | Admitting: Speech Pathology

## 2022-03-27 ENCOUNTER — Encounter: Payer: Medicare PPO | Admitting: Speech Pathology

## 2022-03-29 ENCOUNTER — Encounter: Payer: Medicare PPO | Admitting: Speech Pathology

## 2022-04-03 ENCOUNTER — Encounter: Payer: Medicare PPO | Admitting: Speech Pathology

## 2022-04-05 ENCOUNTER — Encounter: Payer: Medicare PPO | Admitting: Speech Pathology

## 2022-04-10 ENCOUNTER — Encounter: Payer: Medicare PPO | Admitting: Speech Pathology

## 2022-04-12 ENCOUNTER — Encounter: Payer: Medicare PPO | Admitting: Speech Pathology

## 2022-04-17 ENCOUNTER — Encounter: Payer: Medicare PPO | Admitting: Speech Pathology

## 2022-04-19 ENCOUNTER — Encounter: Payer: Medicare PPO | Admitting: Speech Pathology

## 2022-04-24 ENCOUNTER — Encounter: Payer: Medicare PPO | Admitting: Speech Pathology

## 2022-05-31 ENCOUNTER — Ambulatory Visit: Payer: Medicare PPO | Admitting: Dermatology

## 2022-05-31 VITALS — BP 159/89 | HR 88

## 2022-05-31 DIAGNOSIS — L820 Inflamed seborrheic keratosis: Secondary | ICD-10-CM | POA: Diagnosis not present

## 2022-05-31 DIAGNOSIS — L814 Other melanin hyperpigmentation: Secondary | ICD-10-CM

## 2022-05-31 DIAGNOSIS — L570 Actinic keratosis: Secondary | ICD-10-CM | POA: Diagnosis not present

## 2022-05-31 DIAGNOSIS — Z1283 Encounter for screening for malignant neoplasm of skin: Secondary | ICD-10-CM

## 2022-05-31 DIAGNOSIS — L578 Other skin changes due to chronic exposure to nonionizing radiation: Secondary | ICD-10-CM | POA: Diagnosis not present

## 2022-05-31 DIAGNOSIS — D229 Melanocytic nevi, unspecified: Secondary | ICD-10-CM

## 2022-05-31 DIAGNOSIS — Z8589 Personal history of malignant neoplasm of other organs and systems: Secondary | ICD-10-CM

## 2022-05-31 DIAGNOSIS — Z85828 Personal history of other malignant neoplasm of skin: Secondary | ICD-10-CM

## 2022-05-31 DIAGNOSIS — L821 Other seborrheic keratosis: Secondary | ICD-10-CM

## 2022-05-31 NOTE — Patient Instructions (Addendum)
Cryotherapy Aftercare  Wash gently with soap and water everyday.   Apply Vaseline and Band-Aid daily until healed.     Due to recent changes in healthcare laws, you may see results of your pathology and/or laboratory studies on MyChart before the doctors have had a chance to review them. We understand that in some cases there may be results that are confusing or concerning to you. Please understand that not all results are received at the same time and often the doctors may need to interpret multiple results in order to provide you with the best plan of care or course of treatment. Therefore, we ask that you please give us 2 business days to thoroughly review all your results before contacting the office for clarification. Should we see a critical lab result, you will be contacted sooner.   If You Need Anything After Your Visit  If you have any questions or concerns for your doctor, please call our main line at [phone] and press option 4 to reach your doctor's medical assistant. If no one answers, please leave a voicemail as directed and we will return your call as soon as possible. Messages left after 4 pm will be answered the following business day.   You may also send us a message via MyChart. We typically respond to MyChart messages within 1-2 business days.  For prescription refills, please ask your pharmacy to contact our office. Our fax number is [phone].  If you have an urgent issue when the clinic is closed that cannot wait until the next business day, you can page your doctor at the number below.    Please note that while we do our best to be available for urgent issues outside of office hours, we are not available 24/7.   If you have an urgent issue and are unable to reach us, you may choose to seek medical care at your doctor's office, retail clinic, urgent care center, or emergency room.  If you have a medical emergency, please immediately call 911 or go to the  emergency department.  Pager Numbers  - Dr. Kowalski: [phone]  - Dr. Moye: [phone]  - Dr. Stewart: [phone]  In the event of inclement weather, please call our main line at [phone] for an update on the status of any delays or closures.  Dermatology Medication Tips: Please keep the boxes that topical medications come in in order to help keep track of the instructions about where and how to use these. Pharmacies typically print the medication instructions only on the boxes and not directly on the medication tubes.   If your medication is too expensive, please contact our office at [phone] option 4 or send us a message through MyChart.   We are unable to tell what your co-pay for medications will be in advance as this is different depending on your insurance coverage. However, we may be able to find a substitute medication at lower cost or fill out paperwork to get insurance to cover a needed medication.   If a prior authorization is required to get your medication covered by your insurance company, please allow us 1-2 business days to complete this process.  Drug prices often vary depending on where the prescription is filled and some pharmacies may offer cheaper prices.  The website www.goodrx.com contains coupons for medications through different pharmacies. The prices here do not account for what the cost may be with help from insurance (it may be cheaper with your insurance), but the website can   give you the price if you did not use any insurance.  - You can print the associated coupon and take it with your prescription to the pharmacy.  - You may also stop by our office during regular business hours and pick up a GoodRx coupon card.  - If you need your prescription sent electronically to a different pharmacy, notify our office through [facility] MyChart or by phone at [phone] option 4.     Si Usted Necesita Algo Despus de Su Visita  Tambin puede  enviarnos un mensaje a travs de MyChart. Por lo general respondemos a los mensajes de MyChart en el transcurso de 1 a 2 das hbiles.  Para renovar recetas, por favor pida a su farmacia que se ponga en contacto con nuestra oficina. Nuestro nmero de fax es el [phone].  Si tiene un asunto urgente cuando la clnica est cerrada y que no puede esperar hasta el siguiente da hbil, puede llamar/localizar a su doctor(a) al nmero que aparece a continuacin.   Por favor, tenga en cuenta que aunque hacemos todo lo posible para estar disponibles para asuntos urgentes fuera del horario de oficina, no estamos disponibles las 24 horas del da, los 7 das de la semana.   Si tiene un problema urgente y no puede comunicarse con nosotros, puede optar por buscar atencin mdica  en el consultorio de su doctor(a), en una clnica privada, en un centro de atencin urgente o en una sala de emergencias.  Si tiene una emergencia mdica, por favor llame inmediatamente al 911 o vaya a la sala de emergencias.  Nmeros de bper  - Dr. Kowalski: [phone]  - Dra. Moye: [phone]  - Dra. Stewart: [phone]  En caso de inclemencias del tiempo, por favor llame a nuestra lnea principal al [phone] para una actualizacin sobre el estado de cualquier retraso o cierre.  Consejos para la medicacin en dermatologa: Por favor, guarde las cajas en las que vienen los medicamentos de uso tpico para ayudarle a seguir las instrucciones sobre dnde y cmo usarlos. Las farmacias generalmente imprimen las instrucciones del medicamento slo en las cajas y no directamente en los tubos del medicamento.   Si su medicamento es muy caro, por favor, pngase en contacto con nuestra oficina llamando al [phone] y presione la opcin 4 o envenos un mensaje a travs de MyChart.   No podemos decirle cul ser su copago por los medicamentos por adelantado ya que esto es diferente dependiendo de la cobertura de su seguro.  Sin embargo, es posible que podamos encontrar un medicamento sustituto a menor costo o llenar un formulario para que el seguro cubra el medicamento que se considera necesario.   Si se requiere una autorizacin previa para que su compaa de seguros cubra su medicamento, por favor permtanos de 1 a 2 das hbiles para completar este proceso.  Los precios de los medicamentos varan con frecuencia dependiendo del lugar de dnde se surte la receta y alguna farmacias pueden ofrecer precios ms baratos.  El sitio web www.goodrx.com tiene cupones para medicamentos de diferentes farmacias. Los precios aqu no tienen en cuenta lo que podra costar con la ayuda del seguro (puede ser ms barato con su seguro), pero el sitio web puede darle el precio si no utiliz ningn seguro.  - Puede imprimir el cupn correspondiente y llevarlo con su receta a la farmacia.  - Tambin puede pasar por nuestra oficina durante el horario de atencin regular y recoger una tarjeta de cupones de GoodRx.  -   Si necesita que su receta se enve electrnicamente a una farmacia diferente, informe a nuestra oficina a travs de MyChart de [place] o por telfono llamando al [phone] y presione la opcin 4.  

## 2022-05-31 NOTE — Progress Notes (Signed)
Follow-Up Visit   Subjective  [facility]. is a 72 y.o. male who presents for the following: Skin Cancer Screening and Upper Body Skin Exam. Hx of SCC   The patient presents for Upper Body Skin Exam (UBSE) for skin cancer screening and mole check. The patient has spots, moles and lesions to be evaluated, some may be new or changing and the patient has concerns that these could be cancer.    The following portions of the chart were reviewed this encounter and updated as appropriate: medications, allergies, medical history  Review of Systems:  No other skin or systemic complaints except as noted in HPI or Assessment and Plan.  Objective  Well appearing patient in no apparent distress; mood and affect are within normal limits.  All skin waist up examined. Relevant physical exam findings are noted in the Assessment and Plan.    Assessment & Plan    Lentigines, Seborrheic Keratoses, Hemangiomas - Benign normal skin lesions - Benign-appearing - Call for any changes  Melanocytic Nevi - Tan-brown and/or pink-flesh-colored symmetric macules and papules - Benign appearing on exam today - Observation - Call clinic for new or changing moles - Recommend daily use of broad spectrum spf 30+ sunscreen to sun-exposed areas.   Actinic Damage - Chronic condition, secondary to cumulative UV/sun exposure - diffuse scaly erythematous macules with underlying dyspigmentation - Recommend daily broad spectrum sunscreen SPF 30+ to sun-exposed areas, reapply every 2 hours as needed.  - Staying in the shade or wearing long sleeves, sun glasses (UVA+UVB protection) and wide brim hats (4-inch brim around the entire circumference of the hat) are also recommended for sun protection.  - Call for new or changing lesions.  INFLAMED SEBORRHEIC KERATOSIS Exam: Erythematous keratotic or waxy stuck-on papule or plaque.  Symptomatic, irritating, patient would like treated.  Benign-appearing.  Call  clinic for new or changing lesions.   Prior to procedure, discussed risks of blister formation, small wound, skin dyspigmentation, or rare scar following treatment. Recommend Vaseline ointment to treated areas while healing.  Destruction Procedure Note Destruction method: cryotherapy   Informed consent: discussed and consent obtained   Lesion destroyed using liquid nitrogen: Yes   Outcome: patient tolerated procedure well with no complications   Post-procedure details: wound care instructions given   Locations: trunk  # of Lesions Treated: 4     ACTINIC KERATOSIS Exam: Erythematous thin papules/macules with gritty scale  Actinic keratoses are precancerous spots that appear secondary to cumulative UV radiation exposure/sun exposure over time. They are chronic with expected duration over 1 year. A portion of actinic keratoses will progress to squamous cell carcinoma of the skin. It is not possible to reliably predict which spots will progress to skin cancer and so treatment is recommended to prevent development of skin cancer.  Recommend daily broad spectrum sunscreen SPF 30+ to sun-exposed areas, reapply every 2 hours as needed.  Recommend staying in the shade or wearing long sleeves, sun glasses (UVA+UVB protection) and wide brim hats (4-inch brim around the entire circumference of the hat). Call for new or changing lesions.  Treatment Plan:  Prior to procedure, discussed risks of blister formation, small wound, skin dyspigmentation, or rare scar following cryotherapy. Recommend Vaseline ointment to treated areas while healing.  Destruction Procedure Note Destruction method: cryotherapy   Informed consent: discussed and consent obtained   Lesion destroyed using liquid nitrogen: Yes   Outcome: patient tolerated procedure well with no complications   Post-procedure details: wound care instructions  given   Locations: face,ears,neck  # of Lesions Treated: 17     HISTORY OF SQUAMOUS  CELL CARCINOMA OF THE SKIN Multiple see history  - No evidence of recurrence today - No lymphadenopathy - Recommend regular full body skin exams - Recommend daily broad spectrum sunscreen SPF 30+ to sun-exposed areas, reapply every 2 hours as needed.  - Call if any new or changing lesions are noted between office visits    Skin cancer screening performed today.  Return in about 6 months (around 11/30/2022) for TBSE, hx of SCC.  IAngelique Holm, CMA, am acting as scribe for Armida Sans, MD .   Documentation: I have reviewed the above documentation for accuracy and completeness, and I agree with the above.  Armida Sans, MD

## 2022-06-18 ENCOUNTER — Encounter: Payer: Self-pay | Admitting: Dermatology

## 2022-10-11 ENCOUNTER — Ambulatory Visit: Payer: Medicare PPO | Admitting: Dermatology

## 2022-10-11 ENCOUNTER — Encounter: Payer: Self-pay | Admitting: Dermatology

## 2022-10-11 VITALS — BP 145/78

## 2022-10-11 DIAGNOSIS — W908XXA Exposure to other nonionizing radiation, initial encounter: Secondary | ICD-10-CM | POA: Diagnosis not present

## 2022-10-11 DIAGNOSIS — A630 Anogenital (venereal) warts: Secondary | ICD-10-CM | POA: Diagnosis not present

## 2022-10-11 DIAGNOSIS — L570 Actinic keratosis: Secondary | ICD-10-CM

## 2022-10-11 DIAGNOSIS — L820 Inflamed seborrheic keratosis: Secondary | ICD-10-CM

## 2022-10-11 DIAGNOSIS — L821 Other seborrheic keratosis: Secondary | ICD-10-CM | POA: Diagnosis not present

## 2022-10-11 MED ORDER — FLUOROURACIL 5 % EX CREA: TOPICAL_CREAM | Freq: Every evening | CUTANEOUS | 1 refills | Status: DC

## 2022-10-11 NOTE — Progress Notes (Signed)
Follow-Up Visit   Subjective  Vernon Payne. is a 72 y.o. male who presents for the following: check spots, chest x 3, L arm x 1, L ant thigh x 1, face, neck, central chest itchy,  The patient has spots, moles and lesions to be evaluated, some may be new or changing and the patient may have concern these could be cancer.  Patient accompanied by wife who contributes to history.  The following portions of the chart were reviewed this encounter and updated as appropriate: medications, allergies, medical history  Review of Systems:  No other skin or systemic complaints except as noted in HPI or Assessment and Plan.  Objective  Well appearing patient in no apparent distress; mood and affect are within normal limits.   A focused examination was performed of the following areas: Face, neck, chest, left arm, left anterior thigh  Relevant exam findings are noted in the Assessment and Plan.  L forearm x 1, R forehead x 1, L cheek x 3, L preauricular x 2 (7) Pink scaly macules  R chest x 1, central upper chest x 1, L chest x 1, L thigh x 1, R lat canthus x 1, (5) Stuck on waxy paps with erythema  groin Waxy verrucous paps (photo viewed from patients phone)    Assessment & Plan   AK (actinic keratosis) (7) L forearm x 1, R forehead x 1, L cheek x 3, L preauricular x 2  Actinic keratoses are precancerous spots that appear secondary to cumulative UV radiation exposure/sun exposure over time. They are chronic with expected duration over 1 year. A portion of actinic keratoses will progress to squamous cell carcinoma of the skin. It is not possible to reliably predict which spots will progress to skin cancer and so treatment is recommended to prevent development of skin cancer.  Recommend daily broad spectrum sunscreen SPF 30+ to sun-exposed areas, reapply every 2 hours as needed.  Recommend staying in the shade or wearing long sleeves, sun glasses (UVA+UVB protection) and wide brim  hats (4-inch brim around the entire circumference of the hat). Call for new or changing lesions.  Destruction of lesion - L forearm x 1, R forehead x 1, L cheek x 3, L preauricular x 2 (7)  Destruction method: cryotherapy   Informed consent: discussed and consent obtained   Lesion destroyed using liquid nitrogen: Yes   Region frozen until ice ball extended beyond lesion: Yes   Outcome: patient tolerated procedure well with no complications   Post-procedure details: wound care instructions given   Additional details:  Prior to procedure, discussed risks of blister formation, small wound, skin dyspigmentation, or rare scar following cryotherapy. Recommend Vaseline ointment to treated areas while healing.   Inflamed seborrheic keratosis (5) R chest x 1, central upper chest x 1, L chest x 1, L thigh x 1, R lat canthus x 1,  Symptomatic, irritating, patient would like treated.  Destruction of lesion - R chest x 1, central upper chest x 1, L chest x 1, L thigh x 1, R lat canthus x 1, (5)  Destruction method: cryotherapy   Informed consent: discussed and consent obtained   Lesion destroyed using liquid nitrogen: Yes   Region frozen until ice ball extended beyond lesion: Yes   Outcome: patient tolerated procedure well with no complications   Post-procedure details: wound care instructions given   Additional details:  Prior to procedure, discussed risks of blister formation, small wound, skin dyspigmentation, or rare scar  following cryotherapy. Recommend Vaseline ointment to treated areas while healing.   Condyloma groin  Vs SKs  Viral Wart (HPV) Counseling  Discussed viral / HPV (Human Papilloma Virus) etiology and risk of spread /infectivity to other areas of body as well as to other people.  Multiple treatments and methods may be required to clear warts and it is possible treatment may not be successful.  Treatment risks include discoloration; scarring and there is still potential for  wart recurrence.   Offered cryotherapy vs biopsy vs efudex. Patient opts for efudex Start Efudex 5% cr qhs aa, when area gets red and irritated d/c for a week, then restart after a week. Continued use when irritated could lead to erosion or ulceration  Reviewed course of treatment and expected reaction.  Patient advised to expect inflammation and crusting and advised that erosions are possible.  Patient advised to be diligent with sun protection during and after treatment. Handout with details of how to apply medication and what to expect provided. Counseled to keep medication out of reach of children and pets.   fluorouracil (EFUDEX) 5 % cream - groin Apply topically at bedtime. qhs to aa groin until area starts to get red and irritated then d/c for a week and after a week restart qhs     SEBORRHEIC KERATOSIS - Stuck-on, waxy, tan-brown papules and/or plaques  - Benign-appearing - Discussed benign etiology and prognosis. - Observe - Call for any changes - chest, arms,  Return for as scheduled with Dr. Gwen Pounds.  I, Ardis Rowan, RMA, am acting as scribe for Elie Goody, MD .   Documentation: I have reviewed the above documentation for accuracy and completeness, and I agree with the above.  Elie Goody, MD

## 2022-10-11 NOTE — Patient Instructions (Addendum)

## 2023-01-25 ENCOUNTER — Encounter: Payer: Self-pay | Admitting: Gastroenterology

## 2023-01-25 NOTE — H&P (Signed)
Pre-Procedure H&P   Patient ID: Vernon Payne. is a 72 y.o. male.  Gastroenterology Provider: Jaynie Collins, DO  Referring Provider: Fransico Setters, NP PCP: Danella Penton, MD  Date: 01/26/2023  HPI Mr. Vernon Payne. is a 72 y.o. male who presents today for Colonoscopy for Colorectal cancer screening .  No current GI symptoms.  Bowel movements regular without melena or hematochezia.  Last underwent colonoscopy in October 2014 with left-sided diverticulosis and internal hemorrhoids.  Otherwise normal.  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  He has since been on Plavix and aspirin.  Right-sided hemiparesis Creatinine 1.3 hemoglobin 12.4 MCV 89.6 platelets 237,000   Past Medical History:  Diagnosis Date   Actinic keratosis    Arthritis    Diabetes mellitus without complication (HCC)    GERD (gastroesophageal reflux disease)    Hard of hearing    left hearing aid   HLD (hyperlipidemia)    HTN (hypertension)    Hypotension, postural    IBS (irritable bowel syndrome)    Sleep apnea    Squamous cell carcinoma of skin 01/20/2010   Right chest. Well differentiated. Excised: 01/27/2010   Squamous cell carcinoma of skin 07/15/2019   R preauricular ant to lobe   Wears dentures    partial upper, implants on bottom    Past Surgical History:  Procedure Laterality Date   APPENDECTOMY     BACK SURGERY     lumbar   CATARACT EXTRACTION W/PHACO Right 08/29/2017   Procedure: CATARACT EXTRACTION PHACO AND INTRAOCULAR LENS PLACEMENT (IOC) RIGHT DIABETIC;  Surgeon: Lockie Mola, MD;  Location: [facility];  Service: Ophthalmology;  Laterality: Right;  ISTENT INJECT   CATARACT EXTRACTION W/PHACO Left 11/21/2017   Procedure: CATARACT EXTRACTION PHACO AND INTRAOCULAR LENS PLACEMENT (IOC);  Surgeon: Lockie Mola, MD;  Location: [facility];  Service: Ophthalmology;  Laterality: Left;   EYE SURGERY Left  11/21/2017   cataract excision   INSERTION OF ANTERIOR SEGMENT AQUEOUS DRAINAGE DEVICE (ISTENT) Right 08/29/2017   Procedure: (ISTENT);  Surgeon: Lockie Mola, MD;  Location: [facility];  Service: Ophthalmology;  Laterality: Right;  Diabetic - oral meds   INSERTION OF ANTERIOR SEGMENT AQUEOUS DRAINAGE DEVICE (ISTENT) Left 11/21/2017   Procedure: INSERTION OF ANTERIOR SEGMENT AQUEOUS DRAINAGE DEVICE (ISTENT)  INJECT DIABETES ISTENT INJECT;  Surgeon: Lockie Mola, MD;  Location: [facility];  Service: Ophthalmology;  Laterality: Left;  Diabetic - oral meds   JOINT REPLACEMENT     left knee replacement   KYPHOPLASTY N/A 03/28/2019   Procedure: L1 KYPHOPLASTY;  Surgeon: Kennedy Bucker, MD;  Location: [facility];  Service: Orthopedics;  Laterality: N/A;   LASIK     ROTATOR CUFF REPAIR     TONSILLECTOMY      Family History Mother- crc No h/o GI disease or malignancy  Review of Systems  Constitutional:  Negative for activity change, appetite change, chills, diaphoresis, fatigue, fever and unexpected weight change.  HENT:  Negative for trouble swallowing and voice change.   Respiratory:  Negative for shortness of breath and wheezing.   Cardiovascular:  Negative for chest pain, palpitations and leg swelling.  Gastrointestinal:  Negative for abdominal distention, abdominal pain, anal bleeding, blood in stool, constipation, diarrhea, nausea and vomiting.  Musculoskeletal:  Negative for arthralgias and myalgias.  Skin:  Negative for color change and pallor.  Neurological:  Negative for dizziness and syncope.  Psychiatric/Behavioral:  Negative for  confusion. The patient is not nervous/anxious.   All other systems reviewed and are negative.    Medications No current facility-administered medications on file prior to encounter.   Current Outpatient Medications on File Prior to Encounter  Medication Sig Dispense Refill   diphenhydramine-acetaminophen (TYLENOL PM) 25-500  MG TABS tablet Take 2 tablets by mouth at bedtime.      dorzolamide-timolol (COSOPT) 22.3-6.8 MG/ML ophthalmic solution Place 1 drop into both eyes daily.     latanoprost (XALATAN) 0.005 % ophthalmic solution Place 1 drop into both eyes at bedtime.     aspirin EC 81 MG tablet Take 81 mg by mouth daily.     clopidogrel (PLAVIX) 75 MG tablet Take 1 tablet (75 mg total) by mouth daily. 30 tablet 3   glimepiride (AMARYL) 2 MG tablet Take 4 mg by mouth daily.      lisinopril (PRINIVIL,ZESTRIL) 10 MG tablet Take 10 mg by mouth daily.      metFORMIN (GLUCOPHAGE) 500 MG tablet Take 1,000 mg by mouth 2 (two) times daily with a meal.      simvastatin (ZOCOR) 40 MG tablet Take 1 tablet (40 mg total) by mouth at bedtime. 30 tablet 1   TRULICITY 0.75 MG/0.5ML SOPN Inject 0.75 mg into the skin every Sunday. (Patient not taking: Reported on 10/11/2022)      Pertinent medications related to GI and procedure were reviewed by me with the patient prior to the procedure   Current Facility-Administered Medications:    0.9 %  sodium chloride infusion, , Intravenous, Continuous, Jaynie Collins, DO  sodium chloride         Allergies  Allergen Reactions   Morphine And Codeine Nausea And Vomiting   Allergies were reviewed by me prior to the procedure  Objective   Body mass index is 25.44 kg/m. Vitals:   01/26/23 0714  BP: 137/76  Pulse: 86  Resp: 20  Temp: (!) 97.3 F (36.3 C)  TempSrc: Temporal  SpO2: 98%  Weight: 94.8 kg  Height: 6\' 4"  (1.93 m)     Physical Exam Vitals and nursing note reviewed.  Constitutional:      General: He is not in acute distress.    Appearance: Normal appearance. He is not ill-appearing, toxic-appearing or diaphoretic.  HENT:     Head: Normocephalic and atraumatic.     Nose: Nose normal.     Mouth/Throat:     Mouth: Mucous membranes are moist.     Pharynx: Oropharynx is clear.  Eyes:     General: No scleral icterus.    Extraocular Movements:  Extraocular movements intact.  Cardiovascular:     Rate and Rhythm: Normal rate and regular rhythm.     Heart sounds: Normal heart sounds. No murmur heard.    No friction rub. No gallop.  Pulmonary:     Effort: Pulmonary effort is normal. No respiratory distress.     Breath sounds: Normal breath sounds. No wheezing, rhonchi or rales.  Abdominal:     General: Abdomen is flat. Bowel sounds are normal. There is no distension.     Palpations: Abdomen is soft.     Tenderness: There is no abdominal tenderness. There is no guarding or rebound.  Musculoskeletal:     Cervical back: Neck supple.     Right lower leg: No edema.     Left lower leg: No edema.  Skin:    General: Skin is warm and dry.     Coloration: Skin is not jaundiced or  pale.  Neurological:     Mental Status: He is alert and oriented to person, place, and time. Mental status is at baseline.     Motor: Weakness present.  Psychiatric:        Mood and Affect: Mood normal.        Behavior: Behavior normal.        Thought Content: Thought content normal.        Judgment: Judgment normal.      Assessment:  Mr. Sosuke Cortright. is a 72 y.o. male  who presents today for Colonoscopy for Colorectal cancer screening .  Plan:  Colonoscopy with possible intervention today  Colonoscopy with possible biopsy, control of bleeding, polypectomy, and interventions as necessary has been discussed with the patient/patient representative. Informed consent was obtained from the patient/patient representative after explaining the indication, nature, and risks of the procedure including but not limited to death, bleeding, perforation, missed neoplasm/lesions, cardiorespiratory compromise, and reaction to medications. Opportunity for questions was given and appropriate answers were provided. Patient/patient representative has verbalized understanding is amenable to undergoing the procedure.   Jaynie Collins, DO  [facility]  Gastroenterology  Portions of the record may have been created with voice recognition software. Occasional wrong-word or 'sound-a-like' substitutions may have occurred due to the inherent limitations of voice recognition software.  Read the chart carefully and recognize, using context, where substitutions may have occurred.

## 2023-01-26 ENCOUNTER — Encounter: Payer: Self-pay | Admitting: Gastroenterology

## 2023-01-26 ENCOUNTER — Other Ambulatory Visit: Payer: Self-pay

## 2023-01-26 ENCOUNTER — Ambulatory Visit: Payer: Medicare PPO | Admitting: Anesthesiology

## 2023-01-26 ENCOUNTER — Encounter
Admission: RE | Disposition: A | Discharge status: Home / Self Care | Payer: Self-pay | Source: Home / Self Care | Attending: Gastroenterology

## 2023-01-26 ENCOUNTER — Ambulatory Visit
Admission: RE | Admit: 2023-01-26 | Discharge: 2023-01-26 | Disposition: A | Discharge status: Home / Self Care | Payer: Medicare PPO | Attending: Gastroenterology | Admitting: Gastroenterology

## 2023-01-26 DIAGNOSIS — Z7984 Long term (current) use of oral hypoglycemic drugs: Secondary | ICD-10-CM | POA: Insufficient documentation

## 2023-01-26 DIAGNOSIS — E119 Type 2 diabetes mellitus without complications: Secondary | ICD-10-CM | POA: Diagnosis not present

## 2023-01-26 DIAGNOSIS — Z85828 Personal history of other malignant neoplasm of skin: Secondary | ICD-10-CM | POA: Diagnosis not present

## 2023-01-26 DIAGNOSIS — K5730 Diverticulosis of large intestine without perforation or abscess without bleeding: Secondary | ICD-10-CM | POA: Insufficient documentation

## 2023-01-26 DIAGNOSIS — G4730 Sleep apnea, unspecified: Secondary | ICD-10-CM | POA: Insufficient documentation

## 2023-01-26 DIAGNOSIS — I10 Essential (primary) hypertension: Secondary | ICD-10-CM | POA: Insufficient documentation

## 2023-01-26 DIAGNOSIS — Z7902 Long term (current) use of antithrombotics/antiplatelets: Secondary | ICD-10-CM | POA: Diagnosis not present

## 2023-01-26 DIAGNOSIS — D122 Benign neoplasm of ascending colon: Secondary | ICD-10-CM | POA: Insufficient documentation

## 2023-01-26 DIAGNOSIS — K219 Gastro-esophageal reflux disease without esophagitis: Secondary | ICD-10-CM | POA: Insufficient documentation

## 2023-01-26 DIAGNOSIS — K642 Third degree hemorrhoids: Secondary | ICD-10-CM | POA: Diagnosis not present

## 2023-01-26 DIAGNOSIS — Z7985 Long-term (current) use of injectable non-insulin antidiabetic drugs: Secondary | ICD-10-CM | POA: Insufficient documentation

## 2023-01-26 DIAGNOSIS — K589 Irritable bowel syndrome without diarrhea: Secondary | ICD-10-CM | POA: Diagnosis not present

## 2023-01-26 DIAGNOSIS — K635 Polyp of colon: Secondary | ICD-10-CM | POA: Diagnosis not present

## 2023-01-26 DIAGNOSIS — Z1211 Encounter for screening for malignant neoplasm of colon: Secondary | ICD-10-CM | POA: Diagnosis present

## 2023-01-26 HISTORY — PX: COLONOSCOPY WITH PROPOFOL: SHX5780

## 2023-01-26 HISTORY — PX: POLYPECTOMY: SHX5525

## 2023-01-26 LAB — GLUCOSE, CAPILLARY: Glucose-Capillary: 192 mg/dL — ABNORMAL HIGH (ref 70–99)

## 2023-01-26 SURGERY — COLONOSCOPY WITH PROPOFOL
Anesthesia: General

## 2023-01-26 MED ORDER — SODIUM CHLORIDE 0.9 % IV SOLN: INTRAVENOUS | Status: DC

## 2023-01-26 MED ORDER — PROPOFOL 10 MG/ML IV BOLUS
INTRAVENOUS | Status: DC | PRN
  Administered 2023-01-26: 70 mg via INTRAVENOUS

## 2023-01-26 MED ORDER — PROPOFOL 500 MG/50ML IV EMUL
INTRAVENOUS | Status: DC | PRN
  Administered 2023-01-26: 140 ug/kg/min via INTRAVENOUS

## 2023-01-26 NOTE — Interval H&P Note (Signed)
History and Physical Interval Note: Preprocedure H&P from 01/26/23  was reviewed and there was no interval change after seeing and examining the patient.  Written consent was obtained from the patient after discussion of risks, benefits, and alternatives. Patient has consented to proceed with Colonoscopy with possible intervention   01/26/2023 7:27 AM  Vernon Payne.  has presented today for surgery, with the diagnosis of V76.51 (ICD-9-CM) - Z12.11 (ICD-10-CM) - Screening for colon cancer.  The various methods of treatment have been discussed with the patient and family. After consideration of risks, benefits and other options for treatment, the patient has consented to  Procedure(s): COLONOSCOPY WITH PROPOFOL (N/A) as a surgical intervention.  The patient's history has been reviewed, patient examined, no change in status, stable for surgery.  I have reviewed the patient's chart and labs.  Questions were answered to the patient's satisfaction.     Jaynie Collins

## 2023-01-26 NOTE — Anesthesia Preprocedure Evaluation (Signed)
Anesthesia Evaluation  Patient identified by MRN, date of birth, ID band Patient awake    Reviewed: Allergy & Precautions, NPO status , Patient's Chart, lab work & pertinent test results  Airway Mallampati: II  TM Distance: >3 FB Neck ROM: Full    Dental  (+) Partial Upper   Pulmonary neg pulmonary ROS   Pulmonary exam normal breath sounds clear to auscultation       Cardiovascular Exercise Tolerance: Good hypertension, Pt. on medications negative cardio ROS Normal cardiovascular exam Rhythm:Regular Rate:Normal     Neuro/Psych CVA, No Residual Symptoms negative neurological ROS  negative psych ROS   GI/Hepatic negative GI ROS, Neg liver ROS,GERD  Medicated,,  Endo/Other  negative endocrine ROSdiabetes, Type 2, Oral Hypoglycemic Agents  Class 3 obesity  Renal/GU negative Renal ROS  negative genitourinary   Musculoskeletal   Abdominal  (+) + obese  Peds negative pediatric ROS (+)  Hematology negative hematology ROS (+)   Anesthesia Other Findings Past Medical History: No date: Actinic keratosis No date: Arthritis No date: Diabetes mellitus without complication (HCC) No date: GERD (gastroesophageal reflux disease) No date: Hard of hearing     Comment:  left hearing aid No date: HLD (hyperlipidemia) No date: HTN (hypertension) No date: Hypotension, postural No date: IBS (irritable bowel syndrome) No date: Sleep apnea 01/20/2010: Squamous cell carcinoma of skin     Comment:  Right chest. Well differentiated. Excised: 01/27/2010 07/15/2019: Squamous cell carcinoma of skin     Comment:  R preauricular ant to lobe No date: Wears dentures     Comment:  partial upper, implants on bottom  Past Surgical History: No date: APPENDECTOMY No date: BACK SURGERY     Comment:  lumbar 08/29/2017: CATARACT EXTRACTION W/PHACO; Right     Comment:  Procedure: CATARACT EXTRACTION PHACO AND INTRAOCULAR               LENS  PLACEMENT (IOC) RIGHT DIABETIC;  Surgeon:               Lockie Mola, MD;  Location: [facility];              Service: Ophthalmology;  Laterality: Right;  ISTENT               INJECT 11/21/2017: CATARACT EXTRACTION W/PHACO; Left     Comment:  Procedure: CATARACT EXTRACTION PHACO AND INTRAOCULAR               LENS PLACEMENT (IOC);  Surgeon: Lockie Mola, MD;              Location: [facility];  Service: Ophthalmology;                Laterality: Left; 11/21/2017: EYE SURGERY; Left     Comment:  cataract excision 08/29/2017: INSERTION OF ANTERIOR SEGMENT AQUEOUS DRAINAGE DEVICE  (ISTENT); Right     Comment:  Procedure: (ISTENT);  Surgeon: Lockie Mola, MD;              Location: [facility];  Service: Ophthalmology;                Laterality: Right;  Diabetic - oral meds 11/21/2017: INSERTION OF ANTERIOR SEGMENT AQUEOUS DRAINAGE DEVICE  (ISTENT); Left     Comment:  Procedure: INSERTION OF ANTERIOR SEGMENT AQUEOUS               DRAINAGE DEVICE (ISTENT)  INJECT DIABETES ISTENT INJECT;  Surgeon: Lockie Mola, MD;  Location: [facility]               [facility];  Service: Ophthalmology;  Laterality: Left;              Diabetic - oral meds No date: JOINT REPLACEMENT     Comment:  left knee replacement 03/28/2019: KYPHOPLASTY; N/A     Comment:  Procedure: L1 KYPHOPLASTY;  Surgeon: Kennedy Bucker, MD;               Location: [facility];  Service: Orthopedics;  Laterality:               N/A; No date: LASIK No date: ROTATOR CUFF REPAIR No date: TONSILLECTOMY     Reproductive/Obstetrics negative OB ROS                             Anesthesia Physical Anesthesia Plan  ASA: 3  Anesthesia Plan: General   Post-op Pain Management:    Induction: Intravenous  PONV Risk Score and Plan: Propofol infusion and TIVA  Airway Management Planned: Natural Airway and Nasal Cannula  Additional Equipment:    Intra-op Plan:   Post-operative Plan:   Informed Consent: I have reviewed the patients History and Physical, chart, labs and discussed the procedure including the risks, benefits and alternatives for the proposed anesthesia with the patient or authorized representative who has indicated his/her understanding and acceptance.     Dental Advisory Given  Plan Discussed with: CRNA  Anesthesia Plan Comments:        Anesthesia Quick Evaluation

## 2023-01-26 NOTE — Anesthesia Postprocedure Evaluation (Signed)
Anesthesia Post Note  Patient: Vernon Payne.  Procedure(s) Performed: COLONOSCOPY WITH PROPOFOL POLYPECTOMY  Patient location during evaluation: PACU Anesthesia Type: General Level of consciousness: awake Pain management: pain level controlled Vital Signs Assessment: post-procedure vital signs reviewed and stable Cardiovascular status: stable Anesthetic complications: no   No notable events documented.   Last Vitals:  Vitals:   01/26/23 0714 01/26/23 0808  BP: 137/76 124/68  Pulse: 86 94  Resp: 20 17  Temp: (!) 36.3 C (!) 36.3 C  SpO2: 98% 96%    Last Pain:  Vitals:   01/26/23 0808  TempSrc: Tympanic  PainSc: Asleep                 VAN STAVEREN,Cayle Cordoba

## 2023-01-26 NOTE — Op Note (Signed)
[facility] [address] [facility] Patient Name: Vernon Payne Procedure Date: 01/26/2023 7:06 AM MRN: [id number] Account #: [id number] Date of Birth: Oct 21, 1950 Admit Type: Outpatient Age: 72 Room: [facility] ENDO ROOM 1 Gender: Male Note Status: Finalized Instrument Name: Colonscope [id number] Procedure:             Colonoscopy Indications:           Screening for colorectal malignant neoplasm Providers:             Trenda Moots, DO Referring MD:          Jaynie Collins DO, DO (Referring MD), Danella Penton, MD (Referring MD) Medicines:             Monitored Anesthesia Care Complications:         No immediate complications. Estimated blood loss:                         Minimal. Procedure:             Pre-Anesthesia Assessment:                        - Prior to the procedure, a History and Physical was                         performed, and patient medications and allergies were                         reviewed. The patient is competent. The risks and                         benefits of the procedure and the sedation options and                         risks were discussed with the patient. All questions                         were answered and informed consent was obtained.                         Patient identification and proposed procedure were                         verified by the physician, the nurse, the anesthetist                         and the technician in the endoscopy suite. Mental                         Status Examination: alert and oriented. Airway                         Examination: normal oropharyngeal airway and neck                         mobility. Respiratory Examination: clear to  auscultation. CV Examination: RRR, no murmurs, no S3                         or S4. Prophylactic Antibiotics: The patient does not                         require prophylactic antibiotics. Prior                          Anticoagulants: The patient has taken no anticoagulant                         or antiplatelet agents. ASA Grade Assessment: II - A                         patient with mild systemic disease. After reviewing                         the risks and benefits, the patient was deemed in                         satisfactory condition to undergo the procedure. The                         anesthesia plan was to use monitored anesthesia care                         (MAC). Immediately prior to administration of                         medications, the patient was re-assessed for adequacy                         to receive sedatives. The heart rate, respiratory                         rate, oxygen saturations, blood pressure, adequacy of                         pulmonary ventilation, and response to care were                         monitored throughout the procedure. The physical                         status of the patient was re-assessed after the                         procedure.                        After obtaining informed consent, the colonoscope was                         passed under direct vision. Throughout the procedure,                         the patient's blood pressure, pulse, and oxygen  saturations were monitored continuously. The                         Colonoscope was introduced through the anus and                         advanced to the the cecum, identified by appendiceal                         orifice and ileocecal valve. The colonoscopy was                         performed without difficulty. The patient tolerated                         the procedure well. The quality of the bowel                         preparation was evaluated using the BBPS [facility] Bowel                         Preparation Scale) with scores of: Right Colon = 3,                         Transverse Colon = 3 and Left Colon = 3 (entire mucosa                         seen  well with no residual staining, small fragments                         of stool or opaque liquid). The total BBPS score                         equals 9. The ileocecal valve, appendiceal orifice,                         and rectum were photographed. Findings:      Hemorrhoids were found on perianal exam.      The digital rectal exam was normal. Pertinent negatives include normal       sphincter tone.      Non-bleeding internal hemorrhoids were found during retroflexion and       during perianal exam. The hemorrhoids were Grade III (internal       hemorrhoids that prolapse but require manual reduction).      Three sessile polyps were found in the recto-sigmoid colon, transverse       colon and ascending colon. The polyps were 2 to 4 mm in size. These       polyps were removed with a cold snare. Resection and retrieval were       complete. Estimated blood loss was minimal.      A 1 to 2 mm polyp was found in the descending colon. The polyp was       sessile. The polyp was removed with a cold snare. Resection was       complete, but the polyp tissue was not retrieved. Estimated blood loss       was minimal.      Two sessile polyps were found in the  sigmoid colon. The polyps were 1 to       2 mm in size. These polyps were removed with a jumbo cold forceps.       Resection and retrieval were complete. Estimated blood loss was minimal.      The exam was otherwise without abnormality on direct and retroflexion       views. Impression:            - Hemorrhoids found on perianal exam.                        - Non-bleeding internal hemorrhoids.                        - Three 2 to 4 mm polyps at the recto-sigmoid colon,                         in the transverse colon and in the ascending colon,                         removed with a cold snare. Resected and retrieved.                        - One 1 to 2 mm polyp in the descending colon, removed                         with a cold snare. Complete  resection. Polyp tissue                         not retrieved.                        - Two 1 to 2 mm polyps in the sigmoid colon, removed                         with a jumbo cold forceps. Resected and retrieved.                        - The examination was otherwise normal on direct and                         retroflexion views. Recommendation:        - Patient has a contact number available for                         emergencies. The signs and symptoms of potential                         delayed complications were discussed with the patient.                         Return to normal activities tomorrow. Written                         discharge instructions were provided to the patient.                        - Discharge patient to home.                        -  Resume previous diet.                        - Continue present medications.                        - No aspirin, ibuprofen, naproxen, or other                         non-steroidal anti-inflammatory drugs for 5 days after                         polyp removal.                        - Resume Plavix (clopidogrel) at prior dose in 3 days.                         Refer to managing physician for further adjustment of                         therapy.                        - Await pathology results.                        - Repeat colonoscopy for surveillance based on                         pathology results.                        - Return to referring physician as previously                         scheduled.                        - The findings and recommendations were discussed with                         the patient. Procedure Code(s):     --- Professional ---                        [phone], Colonoscopy, flexible; with removal of                         tumor(s), polyp(s), or other lesion(s) by snare                         technique                        45380, 59, Colonoscopy, flexible; with biopsy, single                          or multiple Diagnosis Code(s):     --- Professional ---                        Z12.11, Encounter for screening for malignant neoplasm  of colon                        D12.7, Benign neoplasm of rectosigmoid junction                        D12.3, Benign neoplasm of transverse colon (hepatic                         flexure or splenic flexure)                        D12.2, Benign neoplasm of ascending colon                        D12.5, Benign neoplasm of sigmoid colon                        D12.4, Benign neoplasm of descending colon                        K64.2, Third degree hemorrhoids CPT copyright 2022 [employer]. All rights reserved. The codes documented in this report are preliminary and upon coder review may  be revised to meet current compliance requirements. Attending Participation:      I personally performed the entire procedure. Elfredia Nevins, DO Jaynie Collins DO, DO 01/26/2023 8:13:56 AM This report has been signed electronically. Number of Addenda: 0 Note Initiated On: 01/26/2023 7:06 AM Scope Withdrawal Time: 0 hours 17 minutes 13 seconds  Total Procedure Duration: 0 hours 23 minutes 38 seconds  Estimated Blood Loss:  Estimated blood loss was minimal.      [facility]

## 2023-01-26 NOTE — Interval H&P Note (Signed)
Plavix- last dose 01/21/23

## 2023-01-26 NOTE — Transfer of Care (Signed)
Immediate Anesthesia Transfer of Care Note  Patient: Vernon Payne.  Procedure(s) Performed: COLONOSCOPY WITH PROPOFOL POLYPECTOMY  Patient Location: PACU  Anesthesia Type:General  Level of Consciousness: awake, alert , and oriented  Airway & Oxygen Therapy: Patient Spontanous Breathing  Post-op Assessment: Report given to RN and Post -op Vital signs reviewed and stable  Post vital signs: Reviewed and stable  Last Vitals:  Vitals Value Taken Time  BP 124/68 01/26/23 0808  Temp 36.3 C 01/26/23 0808  Pulse 90 01/26/23 0808  Resp 16 01/26/23 0808  SpO2 94 % 01/26/23 0808  Vitals shown include unfiled device data.  Last Pain:  Vitals:   01/26/23 0808  TempSrc: Tympanic  PainSc: Asleep         Complications: No notable events documented.

## 2023-01-30 LAB — SURGICAL PATHOLOGY

## 2023-02-07 ENCOUNTER — Ambulatory Visit: Payer: Medicare PPO | Admitting: Dermatology

## 2023-02-07 DIAGNOSIS — L720 Epidermal cyst: Secondary | ICD-10-CM | POA: Diagnosis not present

## 2023-02-07 DIAGNOSIS — Z1283 Encounter for screening for malignant neoplasm of skin: Secondary | ICD-10-CM | POA: Diagnosis not present

## 2023-02-07 DIAGNOSIS — L578 Other skin changes due to chronic exposure to nonionizing radiation: Secondary | ICD-10-CM

## 2023-02-07 DIAGNOSIS — L814 Other melanin hyperpigmentation: Secondary | ICD-10-CM

## 2023-02-07 DIAGNOSIS — Z8589 Personal history of malignant neoplasm of other organs and systems: Secondary | ICD-10-CM

## 2023-02-07 DIAGNOSIS — Z872 Personal history of diseases of the skin and subcutaneous tissue: Secondary | ICD-10-CM

## 2023-02-07 DIAGNOSIS — W908XXA Exposure to other nonionizing radiation, initial encounter: Secondary | ICD-10-CM | POA: Diagnosis not present

## 2023-02-07 DIAGNOSIS — D1801 Hemangioma of skin and subcutaneous tissue: Secondary | ICD-10-CM

## 2023-02-07 DIAGNOSIS — L729 Follicular cyst of the skin and subcutaneous tissue, unspecified: Secondary | ICD-10-CM

## 2023-02-07 DIAGNOSIS — L821 Other seborrheic keratosis: Secondary | ICD-10-CM

## 2023-02-07 DIAGNOSIS — L820 Inflamed seborrheic keratosis: Secondary | ICD-10-CM

## 2023-02-07 DIAGNOSIS — D229 Melanocytic nevi, unspecified: Secondary | ICD-10-CM

## 2023-02-07 DIAGNOSIS — L570 Actinic keratosis: Secondary | ICD-10-CM

## 2023-02-07 DIAGNOSIS — Z85828 Personal history of other malignant neoplasm of skin: Secondary | ICD-10-CM

## 2023-02-07 NOTE — Progress Notes (Signed)
Follow-Up Visit   Subjective  Vernon Payne. is a 72 y.o. male who presents for the following: Skin Cancer Screening and Full Body Skin Exam Chest, and face some spots   Hx of of aks and isks   The patient presents for Total-Body Skin Exam (TBSE) for skin cancer screening and mole check. The patient has spots, moles and lesions to be evaluated, some may be new or changing and the patient may have concern these could be cancer.    The following portions of the chart were reviewed this encounter and updated as appropriate: medications, allergies, medical history  Review of Systems:  No other skin or systemic complaints except as noted in HPI or Assessment and Plan.  Objective  Well appearing patient in no apparent distress; mood and affect are within normal limits.  A full examination was performed including scalp, head, eyes, ears, nose, lips, neck, chest, axillae, abdomen, back, buttocks, bilateral upper extremities, bilateral lower extremities, hands, feet, fingers, toes, fingernails, and toenails. All findings within normal limits unless otherwise noted below.   Relevant physical exam findings are noted in the Assessment and Plan.  face x 6 (6) Erythematous thin papules/macules with gritty scale.  left neck x 2, right chest x 2 (4) Erythematous stuck-on, waxy papule or plaque  Assessment & Plan   SKIN CANCER SCREENING PERFORMED TODAY.  EPIDERMAL INCLUSION CYST Exam: Subcutaneous nodule at right superior chest  Benign-appearing. Exam most consistent with an epidermal inclusion cyst. Discussed that a cyst is a benign growth that can grow over time and sometimes get irritated or inflamed. Recommend observation if it is not bothersome. Discussed option of surgical excision to remove it if it is growing, symptomatic, or other changes noted. Please call for new or changing lesions so they can be evaluated.     ACTINIC DAMAGE - Chronic condition, secondary to cumulative  UV/sun exposure - diffuse scaly erythematous macules with underlying dyspigmentation - Recommend daily broad spectrum sunscreen SPF 30+ to sun-exposed areas, reapply every 2 hours as needed.  - Staying in the shade or wearing long sleeves, sun glasses (UVA+UVB protection) and wide brim hats (4-inch brim around the entire circumference of the hat) are also recommended for sun protection.  - Call for new or changing lesions.  LENTIGINES, SEBORRHEIC KERATOSES, HEMANGIOMAS - Benign normal skin lesions - Benign-appearing - Call for any changes  MELANOCYTIC NEVI - Tan-brown and/or pink-flesh-colored symmetric macules and papules - Benign appearing on exam today - Observation - Call clinic for new or changing moles - Recommend daily use of broad spectrum spf 30+ sunscreen to sun-exposed areas.   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  - Call if any new or changing lesions are noted between office visits       ACTINIC KERATOSIS (6) face x 6 (6) Actinic keratoses are precancerous spots that appear secondary to cumulative UV radiation exposure/sun exposure over time. They are chronic with expected duration over 1 year. A portion of actinic keratoses will progress to squamous cell carcinoma of the skin. It is not possible to reliably predict which spots will progress to skin cancer and so treatment is recommended to prevent development of skin cancer.  Recommend daily broad spectrum sunscreen SPF  30+ to sun-exposed areas, reapply every 2 hours as needed.  Recommend staying in the shade or wearing long sleeves, sun glasses (UVA+UVB protection) and wide brim hats (4-inch brim around the entire circumference of the  hat). Call for new or changing lesions. Destruction of lesion - face x 6 (6) Complexity: simple   Destruction method: cryotherapy   Informed consent: discussed and consent obtained   Timeout:  patient name, date of birth, surgical site, and procedure verified Lesion destroyed using liquid nitrogen: Yes   Region frozen until ice ball extended beyond lesion: Yes   Outcome: patient tolerated procedure well with no complications   Post-procedure details: wound care instructions given   INFLAMED SEBORRHEIC KERATOSIS (4) left neck x 2, right chest x 2 (4) Symptomatic, irritating, patient would like treated. Destruction of lesion - left neck x 2, right chest x 2 (4) Complexity: simple   Destruction method: cryotherapy   Informed consent: discussed and consent obtained   Timeout:  patient name, date of birth, surgical site, and procedure verified Lesion destroyed using liquid nitrogen: Yes   Region frozen until ice ball extended beyond lesion: Yes   Outcome: patient tolerated procedure well with no complications   Post-procedure details: wound care instructions given   Return in about 1 year (around 02/07/2024) for TBSE.  IAsher Muir, CMA, am acting as scribe for Armida Sans, MD.   Documentation: I have reviewed the above documentation for accuracy and completeness, and I agree with the above.  Armida Sans, MD

## 2023-02-07 NOTE — Patient Instructions (Addendum)
Actinic keratoses are precancerous spots that appear secondary to cumulative UV radiation exposure/sun exposure over time. They are chronic with expected duration over 1 year. A portion of actinic keratoses will progress to squamous cell carcinoma of the skin. It is not possible to reliably predict which spots will progress to skin cancer and so treatment is recommended to prevent development of skin cancer.  Recommend daily broad spectrum sunscreen SPF 30+ to sun-exposed areas, reapply every 2 hours as needed.  Recommend staying in the shade or wearing long sleeves, sun glasses (UVA+UVB protection) and wide brim hats (4-inch brim around the entire circumference of the hat). Call for new or changing lesions.   Cryotherapy Aftercare  Wash gently with soap and water everyday.   Apply Vaseline and Band-Aid daily until healed.    Seborrheic Keratosis  What causes seborrheic keratoses? Seborrheic keratoses are harmless, common skin growths that first appear during adult life.  As time goes by, more growths appear.  Some people may develop a large number of them.  Seborrheic keratoses appear on both covered and uncovered body parts.  They are not caused by sunlight.  The tendency to develop seborrheic keratoses can be inherited.  They vary in color from skin-colored to gray, brown, or even black.  They can be either smooth or have a rough, warty surface.   Seborrheic keratoses are superficial and look as if they were stuck on the skin.  Under the microscope this type of keratosis looks like layers upon layers of skin.  That is why at times the top layer may seem to fall off, but the rest of the growth remains and re-grows.    Treatment Seborrheic keratoses do not need to be treated, but can easily be removed in the office.  Seborrheic keratoses often cause symptoms when they rub on clothing or jewelry.  Lesions can be in the way of shaving.  If they become inflamed, they can cause itching, soreness, or  burning.  Removal of a seborrheic keratosis can be accomplished by freezing, burning, or surgery. If any spot bleeds, scabs, or grows rapidly, please return to have it checked, as these can be an indication of a skin cancer.      Melanoma ABCDEs  Melanoma is the most dangerous type of skin cancer, and is the leading cause of death from skin disease.  You are more likely to develop melanoma if you: Have light-colored skin, light-colored eyes, or red or blond hair Spend a lot of time in the sun Tan regularly, either outdoors or in a tanning bed Have had blistering sunburns, especially during childhood Have a close family member who has had a melanoma Have atypical moles or large birthmarks  Early detection of melanoma is key since treatment is typically straightforward and cure rates are extremely high if we catch it early.   The first sign of melanoma is often a change in a mole or a new dark spot.  The ABCDE system is a way of remembering the signs of melanoma.  A for asymmetry:  The two halves do not match. B for border:  The edges of the growth are irregular. C for color:  A mixture of colors are present instead of an even brown color. D for diameter:  Melanomas are usually (but not always) greater than 6mm - the size of a pencil eraser. E for evolution:  The spot keeps changing in size, shape, and color.  Please check your skin once per month between visits. You  can use a small mirror in front and a large mirror behind you to keep an eye on the back side or your body.   If you see any new or changing lesions before your next follow-up, please call to schedule a visit.  Please continue daily skin protection including broad spectrum sunscreen SPF 30+ to sun-exposed areas, reapplying every 2 hours as needed when you're outdoors.   Staying in the shade or wearing long sleeves, sun glasses (UVA+UVB protection) and wide brim hats (4-inch brim around the entire circumference of the hat)  are also recommended for sun protection.    Due to recent changes in healthcare laws, you may see results of your pathology and/or laboratory studies on MyChart before the doctors have had a chance to review them. We understand that in some cases there may be results that are confusing or concerning to you. Please understand that not all results are received at the same time and often the doctors may need to interpret multiple results in order to provide you with the best plan of care or course of treatment. Therefore, we ask that you please give [place] 2 business days to thoroughly review all your results before contacting the office for clarification. Should we see a critical lab result, you will be contacted sooner.   If You Need Anything After Your Visit  If you have any questions or concerns for your doctor, please call our main line at [phone] and press option 4 to reach your doctor's medical assistant. If no one answers, please leave a voicemail as directed and we will return your call as soon as possible. Messages left after 4 pm will be answered the following business day.   You may also send [place] a message via MyChart. We typically respond to MyChart messages within 1-2 business days.  For prescription refills, please ask your pharmacy to contact our office. Our fax number is [phone].  If you have an urgent issue when the clinic is closed that cannot wait until the next business day, you can page your doctor at the number below.    Please note that while we do our best to be available for urgent issues outside of office hours, we are not available 24/7.   If you have an urgent issue and are unable to reach [place], you may choose to seek medical care at your doctor's office, retail clinic, urgent care center, or emergency room.  If you have a medical emergency, please immediately call 911 or go to the emergency department.  Pager Numbers  - Dr. Gwen Pounds: [phone]  - Dr. Roseanne Reno:  [phone]  - Dr. Katrinka Blazing: [phone]   In the event of inclement weather, please call our main line at [phone] for an update on the status of any delays or closures.  Dermatology Medication Tips: Please keep the boxes that topical medications come in in order to help keep track of the instructions about where and how to use these. Pharmacies typically print the medication instructions only on the boxes and not directly on the medication tubes.   If your medication is too expensive, please contact our office at [phone] option 4 or send [place] a message through MyChart.   We are unable to tell what your co-pay for medications will be in advance as this is different depending on your insurance coverage. However, we may be able to find a substitute medication at lower cost or fill out paperwork to get insurance to cover a needed medication.  If a prior authorization is required to get your medication covered by your insurance company, please allow [place] 1-2 business days to complete this process.  Drug prices often vary depending on where the prescription is filled and some pharmacies may offer cheaper prices.  The website www.goodrx.com contains coupons for medications through different pharmacies. The prices here do not account for what the cost may be with help from insurance (it may be cheaper with your insurance), but the website can give you the price if you did not use any insurance.  - You can print the associated coupon and take it with your prescription to the pharmacy.  - You may also stop by our office during regular business hours and pick up a GoodRx coupon card.  - If you need your prescription sent electronically to a different pharmacy, notify our office through [facility] or by phone at [phone] option 4.     Si Usted Necesita Algo Despus de Su Visita  Tambin puede enviarnos un mensaje a travs de Clinical cytogeneticist. Por lo general respondemos a los mensajes de  MyChart en el transcurso de 1 a 2 das hbiles.  Para renovar recetas, por favor pida a su farmacia que se ponga en contacto con nuestra oficina. Annie Sable de fax es [place] [phone].  Si tiene un asunto urgente cuando la clnica est cerrada y que no puede esperar hasta el siguiente da hbil, puede llamar/localizar a su doctor(a) al nmero que aparece a continuacin.   Por favor, tenga en cuenta que aunque hacemos todo lo posible para estar disponibles para asuntos urgentes fuera del horario de [place], no estamos disponibles las 24 horas del da, los 7 [address]  [address] de la [place].   Si tiene un problema urgente y no puede comunicarse con nosotros, puede optar por buscar atencin mdica  en el consultorio de su doctor(a), en una clnica privada, en un centro de atencin urgente o en una sala de emergencias.  Si tiene Engineer, drilling, por favor llame inmediatamente al 911 o vaya a la sala de emergencias.  Nmeros de bper  - Dr. Gwen Pounds: [phone]  - Dra. Roseanne Reno: [phone]  - Dr. Katrinka Blazing: [phone]   En caso de inclemencias del tiempo, por favor llame a Lacy Duverney principal al [phone] para una actualizacin sobre el [place] de cualquier retraso o cierre.  Consejos para la medicacin en dermatologa: Por favor, guarde las cajas en las que vienen los medicamentos de uso tpico para ayudarle a seguir las instrucciones sobre dnde y cmo usarlos. Las farmacias generalmente imprimen las instrucciones del medicamento slo en las cajas y no directamente en los tubos del [place].   Si su medicamento es muy caro, por favor, pngase en contacto con Rolm Gala llamando al [phone] y presione la opcin 4 o envenos un mensaje a travs de Clinical cytogeneticist.   No podemos decirle cul ser su copago por los medicamentos por adelantado ya que esto es diferente dependiendo de la cobertura de su seguro. Sin embargo, es posible que podamos encontrar un medicamento sustituto a Advice worker un formulario para que el seguro cubra el medicamento que se considera necesario.   Si se requiere una autorizacin previa para que su compaa de seguros [place] su medicamento, por favor permtanos de 1 a 2 das hbiles para completar [address]  Los precios de los medicamentos varan con frecuencia dependiendo del Environmental consultant de dnde se surte la receta y alguna farmacias pueden ofrecer precios ms baratos.  El sitio web  www.goodrx.com tiene cupones para medicamentos de Health and safety inspector. Los precios aqu no tienen en cuenta lo que podra costar con la ayuda del seguro (puede ser ms barato con su seguro), pero el sitio web puede darle el precio si no utiliz Tourist information centre manager.  - Puede imprimir el cupn correspondiente y llevarlo con su receta a la farmacia.  - Tambin puede pasar por nuestra oficina durante el horario de atencin regular y Education officer, museum una tarjeta de cupones de GoodRx.  - Si necesita que su receta se enve electrnicamente a una farmacia diferente, informe a nuestra oficina a travs de MyChart de [place] o por telfono llamando al [phone] y presione la opcin 4.

## 2023-02-18 ENCOUNTER — Encounter: Payer: Self-pay | Admitting: Dermatology

## 2023-04-22 NOTE — Discharge Instructions (Signed)
 Instructions after Total Knee Replacement   Vernon Payne Vernon Payne., M.D.    Dept. of Orthopaedics & Sports Medicine [facility] [address] [place], [place]  [address]  Phone: [phone]   Fax: [phone]       [email or web address]       DIET: Drink plenty of non-alcoholic fluids. Resume your normal diet. Include foods high in fiber.  ACTIVITY:  You may use crutches or a walker with weight-bearing as tolerated, unless instructed otherwise. You may be weaned off of the walker or crutches by your Physical Therapist.  Do NOT place pillows under the knee. Anything placed under the knee could limit your ability to straighten the knee.   Use the Bone Foam 3 times a day for 30 minutes each session to help straighten the knee. Continue doing gentle exercises. Exercising will reduce the pain and swelling, increase motion, and prevent muscle weakness.   Please continue to use the TED compression stockings for 6 weeks. You may remove the stockings at night, but should reapply them in the morning. Do not drive or operate any equipment until instructed.  WOUND CARE:  The initial dressing (Aquacel) can remain in place for 7 days (see separate instructions). Continue to use the PolarCare or ice packs periodically to reduce pain and swelling. You may bathe or shower after the staples are removed at the first office visit following surgery.  MEDICATIONS: You may resume your regular medications. Please take the pain medication as prescribed on the medication. Do not take pain medication on an empty stomach. Unless instructed otherwise, you should take an enteric-coated aspirin 81 mg. TWICE a day. (This along with elevation will help reduce the possibility of blood clots/phlebitis in your operated leg.) Use a stool softener (such as Senokot-S or Colace) daily and a laxative (such as Miralax or Dulcolax) as needed to prevent constipation.  Do not drive or drink alcoholic beverages when  taking pain medications.  CALL THE OFFICE FOR: Temperature above 101 degrees Excessive bleeding or drainage on the dressing. Excessive swelling, coldness, or paleness of the toes. Persistent nausea and vomiting.  FOLLOW-UP:  You should have an appointment to return to the office in 10-14 days after surgery. Arrangements have been made for continuation of Physical Therapy (either home therapy or outpatient therapy).     [facility] Department Directory         [email or web address]       [email or web address]          Cardiology  Appointments: [place] - [phone]  Endocrinology  Appointments: [place] [phone] [place] - [phone]  Gastroenterology  Appointments: [employer] [phone] [place] - [phone]        General Surgery   Appointments: [facility]  Internal Medicine/Family Medicine  Appointments: [facility] [place] - [phone] [place] - [phone]  Metabolic and Weigh Loss Surgery  Appointments: [facility]        Neurology  Appointments: [place] [phone] [place] - [phone]  Neurosurgery  Appointments: [place]  Obstetrics & Gynecology  Appointments: [place] [phone] [place] - [phone]        Pediatrics  Appointments: [place] [phone] [place] - [phone]  Physiatry  Appointments: [place] [phone]  Physical Therapy  Appointments: [place] - [phone]        Podiatry  Appointments: [place] [phone] [place] - [phone]  Pulmonology  Appointments: [facility]  Rheumatology  Appointments: [place] [phone]  [place] Location: [facility]  [address] [place], [place]  [address]  [place] Location: [facility]. [address] [place], [place]  [address]  [place]  Location: [facility] [address] [place], [place]  [address]

## 2023-04-24 ENCOUNTER — Other Ambulatory Visit: Payer: Self-pay

## 2023-04-24 ENCOUNTER — Encounter: Payer: Self-pay | Admitting: Orthopedic Surgery

## 2023-04-24 ENCOUNTER — Encounter
Admission: RE | Admit: 2023-04-24 | Discharge: 2023-04-24 | Disposition: A | Discharge status: Home / Self Care | Payer: Medicare PPO | Source: Ambulatory Visit | Attending: Orthopedic Surgery | Admitting: Orthopedic Surgery

## 2023-04-24 VITALS — BP 122/81 | HR 78 | Temp 98.2°F | Resp 20 | Wt 218.0 lb

## 2023-04-24 DIAGNOSIS — M1711 Unilateral primary osteoarthritis, right knee: Secondary | ICD-10-CM | POA: Insufficient documentation

## 2023-04-24 DIAGNOSIS — Z01818 Encounter for other preprocedural examination: Secondary | ICD-10-CM | POA: Diagnosis present

## 2023-04-24 DIAGNOSIS — Z01812 Encounter for preprocedural laboratory examination: Secondary | ICD-10-CM

## 2023-04-24 DIAGNOSIS — E119 Type 2 diabetes mellitus without complications: Secondary | ICD-10-CM | POA: Insufficient documentation

## 2023-04-24 HISTORY — DX: Cerebral infarction, unspecified: I63.9

## 2023-04-24 LAB — CBC
HCT: 37.5 % — ABNORMAL LOW (ref 39.0–52.0)
Hemoglobin: 12.5 g/dL — ABNORMAL LOW (ref 13.0–17.0)
MCH: 28.9 pg (ref 26.0–34.0)
MCHC: 33.3 g/dL (ref 30.0–36.0)
MCV: 86.8 fL (ref 80.0–100.0)
Platelets: 285 10*3/uL (ref 150–400)
RBC: 4.32 MIL/uL (ref 4.22–5.81)
RDW: 12.4 % (ref 11.5–15.5)
WBC: 6.4 10*3/uL (ref 4.0–10.5)
nRBC: 0 % (ref 0.0–0.2)

## 2023-04-24 LAB — COMPREHENSIVE METABOLIC PANEL
ALT: 17 U/L (ref 0–44)
AST: 17 U/L (ref 15–41)
Albumin: 4.3 g/dL (ref 3.5–5.0)
Alkaline Phosphatase: 57 U/L (ref 38–126)
Anion gap: 9 (ref 5–15)
BUN: 16 mg/dL (ref 8–23)
CO2: 25 mmol/L (ref 22–32)
Calcium: 9.7 mg/dL (ref 8.9–10.3)
Chloride: 104 mmol/L (ref 98–111)
Creatinine, Ser: 1.13 mg/dL (ref 0.61–1.24)
GFR, Estimated: >60 mL/min (ref >60)
Glucose, Bld: 104 mg/dL — ABNORMAL HIGH (ref 70–99)
Potassium: 4.2 mmol/L (ref 3.5–5.1)
Sodium: 138 mmol/L (ref 135–145)
Total Bilirubin: 0.8 mg/dL (ref 0.0–1.2)
Total Protein: 7.1 g/dL (ref 6.5–8.1)

## 2023-04-24 LAB — URINALYSIS, ROUTINE W REFLEX MICROSCOPIC
Bilirubin Urine: NEGATIVE
Glucose, UA: 150 mg/dL — AB
Hgb urine dipstick: NEGATIVE
Ketones, ur: NEGATIVE mg/dL
Leukocytes,Ua: NEGATIVE
Nitrite: NEGATIVE
Protein, ur: NEGATIVE mg/dL
Specific Gravity, Urine: 1.024 (ref 1.005–1.030)
pH: 5 (ref 5.0–8.0)

## 2023-04-24 LAB — C-REACTIVE PROTEIN: CRP: <0.5 mg/dL (ref <1.0)

## 2023-04-24 LAB — HEMOGLOBIN A1C
Hgb A1c MFr Bld: 7 % — ABNORMAL HIGH (ref 4.8–5.6)
Mean Plasma Glucose: 154.2 mg/dL

## 2023-04-24 LAB — SURGICAL PCR SCREEN
MRSA, PCR: NEGATIVE
Staphylococcus aureus: NEGATIVE

## 2023-04-24 LAB — SEDIMENTATION RATE: Sed Rate: 13 mm/h (ref 0–20)

## 2023-04-24 NOTE — Pre-Procedure Instructions (Signed)
 As of today, 04/24/2023 -Patient has not received special instructions regarding plavix. Therefore, I have instructed patient to call the prescribing doctor Dr.Sha (neurologist) for a recommendation for surgery purposes. Patient and wife verbalized the importance of plavix recommendation from prescribing doctor. In addition, patient verbalized that he would like to get general anesthesia instead of spinal due to history back surgery .  Doctor Hooten and Pre-admit NP made aware.

## 2023-04-24 NOTE — Patient Instructions (Addendum)
 Your procedure is scheduled on: Monday- April 30, 2023  Report to the Registration Desk on the 1st floor of the [facility] To find out your arrival time, please call [phone] between 1PM - 3PM on: Friday, April 27, 2023 If your arrival time is 6:00 am, do not arrive before that time as the [facility] entrance doors do not open until 6:00 am.  REMEMBER: Instructions that are not followed completely may result in serious medical risk, up to and including death; or upon the discretion of your surgeon and anesthesiologist your surgery may need to be rescheduled.  Do not eat food after midnight the night before surgery.  No gum chewing or hard candies.  You may however, drink CLEAR liquids up to 2 hours before you are scheduled to arrive for your surgery. Do not drink anything within 2 hours of your scheduled arrival time.  Clear liquids include: - water    In addition, your doctor has ordered for you to drink the provided:   Gatorade G2 Drinking this carbohydrate drink up to two hours before surgery helps to reduce insulin resistance and improve patient outcomes. Please complete drinking 2 hours before scheduled arrival time.  One week prior to surgery: Stop Anti-inflammatories (NSAIDS) such as Advil, Aleve, Ibuprofen, Motrin, Naproxen, Naprosyn and Aspirin based products such as Excedrin, Goody's Powder, BC Powder. Stop ANY OVER THE COUNTER supplements until after surgery like  Calcium and B-12  You may however, continue to take Tylenol if needed for pain up until the day of surgery.   DO not take metformin two days prior to surgery. Last dose is March 7/ Friday  Do not take glipizide on day of surgery  Do not take TRULICITY 7 days prior to surgery. Last dose would be March 2, Sunday.    Ask your Prescribing Doctor  regarding plavix recommendation for surgery purposes due to possibility getting spinal anesthesia .  Your surgeon, did not give you special instructions  regarding aspirin so continue taking aspirin until the day before surgery. Last dose will  be March 10/2023 .   Continue taking all of your other prescription medications up until the day of surgery.  ON THE DAY OF SURGERY ONLY TAKE THESE MEDICATIONS WITH SIPS OF WATER:  pantoprazole (PROTONIX PARoxetine (PAXIL)   No Alcohol for 24 hours before or after surgery.  No Smoking including e-cigarettes for 24 hours before surgery.  No chewable tobacco products for at least 6 hours before surgery.  No nicotine patches on the day of surgery.  Do not use any "recreational" drugs for at least a week (preferably 2 weeks) before your surgery.  Please be advised that the combination of cocaine and anesthesia may have negative outcomes, up to and including death. If you test positive for cocaine, your surgery will be cancelled.  On the morning of surgery brush your teeth with toothpaste and water, you may rinse your mouth with mouthwash if you wish. Do not swallow any toothpaste or mouthwash.  Use CHG Soap or wipes as directed on instruction sheet.-  provided for you!  Do not wear jewelry, make-up, hairpins, clips or nail polish.  For welded (permanent) jewelry: bracelets, anklets, waist bands, etc.  Please have this removed prior to surgery.  If it is not removed, there is a chance that hospital personnel will need to cut it off on the day of surgery.  Do not wear lotions, powders, or perfumes.   Do not shave body hair from the neck  down 48 hours before surgery.  Contact lenses, hearing aids and dentures may not be worn into surgery.  Do not bring valuables to the hospital. [facility] is not responsible for any missing/lost belongings or valuables.   Notify your doctor if there is any change in your medical condition (cold, fever, infection).  Wear comfortable clothing (specific to your surgery type) to the hospital.  After surgery, you can help prevent lung complications by doing  breathing exercises.  Take deep breaths and cough every 1-2 hours. Your doctor may order a device called an Incentive Spirometer to help you take deep breaths.  If you are being admitted to the hospital overnight, leave your suitcase in the car. After surgery it may be brought to your room.  In case of increased patient census, it may be necessary for you, the patient, to continue your postoperative care in the Same Day Surgery department.  If you are being discharged the day of surgery, you will not be allowed to drive home. You will need a responsible individual to drive you home and stay with you for 24 hours after surgery.    Please call the Pre-admissions Testing Dept. at [phone] if you have any questions about these instructions.  Surgery Visitation Policy:  Patients having surgery or a procedure may have two visitors.  Children under the age of 82 must have an adult with them who is not the patient.  Temporary Visitor Restrictions Due to increasing cases of flu, RSV and COVID-19: Children ages 28 and under will not be able to visit patients in [facility] hospitals under most circumstances.  Inpatient Visitation:    Visiting hours are 7 a.m. to 8 p.m. Up to four visitors are allowed at one time in a patient room. The visitors may rotate out with other people during the day.  One visitor age 99 or older may stay with the patient overnight and must be in the room by 8 p.m.     Pre-operative 5 CHG Bath Instructions   You can play a key role in reducing the risk of infection after surgery. Your skin needs to be as free of germs as possible. You can reduce the number of germs on your skin by washing with CHG (chlorhexidine gluconate) soap before surgery. CHG is an antiseptic soap that kills germs and continues to kill germs even after washing.   DO NOT use if you have an allergy to chlorhexidine/CHG or antibacterial soaps. If your skin becomes reddened or irritated, stop  using the CHG and notify one of our RNs at [phone].   Please shower with the CHG soap starting 4 days before surgery using the following schedule:       Please keep in mind the following:  DO NOT shave, including legs and underarms, starting the day of your first shower.   You may shave your face at any point before/day of surgery.  Place clean sheets on your bed the day you start using CHG soap. Use a clean washcloth (not used since being washed) for each shower. DO NOT sleep with pets once you start using the CHG.   CHG Shower Instructions:  If you choose to wash your hair and private area, wash first with your normal shampoo/soap.  After you use shampoo/soap, rinse your hair and body thoroughly to remove shampoo/soap residue.  Turn the water OFF and apply about 3 tablespoons (45 ml) of CHG soap to a CLEAN washcloth.  Apply CHG soap ONLY FROM  YOUR NECK DOWN TO YOUR TOES (washing for 3-5 minutes)  DO NOT use CHG soap on face, private areas, open wounds, or sores.  Pay special attention to the area where your surgery is being performed.  If you are having back surgery, having someone wash your back for you may be helpful. Wait 2 minutes after CHG soap is applied, then you may rinse off the CHG soap.  Pat dry with a clean towel  Put on clean clothes/pajamas   If you choose to wear lotion, please use ONLY the CHG-compatible lotions on the back of this paper.     Additional instructions for the day of surgery: DO NOT APPLY any lotions, deodorants, cologne, or perfumes.   Put on clean/comfortable clothes.  Brush your teeth.  Ask your nurse before applying any prescription medications to the skin.      CHG Compatible Lotions   Aveeno Moisturizing lotion  Cetaphil Moisturizing Cream  Cetaphil Moisturizing Lotion  Clairol Herbal Essence Moisturizing Lotion, Dry Skin  Clairol Herbal Essence Moisturizing Lotion, Extra Dry Skin  Clairol Herbal Essence Moisturizing Lotion,  Normal Skin  Curel Age Defying Therapeutic Moisturizing Lotion with Alpha Hydroxy  Curel Extreme Care Body Lotion  Curel Soothing Hands Moisturizing Hand Lotion  Curel Therapeutic Moisturizing Cream, Fragrance-Free  Curel Therapeutic Moisturizing Lotion, Fragrance-Free  Curel Therapeutic Moisturizing Lotion, Original Formula  Eucerin Daily Replenishing Lotion  Eucerin Dry Skin Therapy Plus Alpha Hydroxy Crme  Eucerin Dry Skin Therapy Plus Alpha Hydroxy Lotion  Eucerin Original Crme  Eucerin Original Lotion  Eucerin Plus Crme Eucerin Plus Lotion  Eucerin TriLipid Replenishing Lotion  Keri Anti-Bacterial Hand Lotion  Keri Deep Conditioning Original Lotion Dry Skin Formula Softly Scented  Keri Deep Conditioning Original Lotion, Fragrance Free Sensitive Skin Formula  Keri Lotion Fast Absorbing Fragrance Free Sensitive Skin Formula  Keri Lotion Fast Absorbing Softly Scented Dry Skin Formula  Keri Original Lotion  Keri Skin Renewal Lotion Keri Silky Smooth Lotion  Keri Silky Smooth Sensitive Skin Lotion  Nivea Body Creamy Conditioning Oil  Nivea Body Extra Enriched Lotion  Nivea Body Original Lotion  Nivea Body Sheer Moisturizing Lotion Nivea Crme  Nivea Skin Firming Lotion  NutraDerm 30 Skin Lotion  NutraDerm Skin Lotion  NutraDerm Therapeutic Skin Cream  NutraDerm Therapeutic Skin Lotion  ProShield Protective Hand Cream  Provon moisturizing lotion   Preoperative Educational Videos for Total Hip, Knee and Shoulder Replacements  To better prepare for surgery, please view our videos that explain the physical activity and discharge planning required to have the best surgical recovery at [facility].  [email or web address]  Questions? Call [phone] or email [email or web address]     How to Use an Incentive Spirometer  An incentive spirometer is a tool that measures how well you are  filling your lungs with each breath. Learning to take long, deep breaths using this tool can help you keep your lungs clear and active. This may help to reverse or lessen your chance of developing breathing (pulmonary) problems, especially infection. You may be asked to use a spirometer: After a surgery. If you have a lung problem or a history of smoking. After a long period of time when you have been unable to move or be active. If the spirometer includes an indicator to show the highest number that you have reached, your health care provider or respiratory therapist will help you set a goal. Keep a log of your progress as told by your health care provider. What are the risks? Breathing  too quickly may cause dizziness or cause you to pass out. Take your time so you do not get dizzy or light-headed. If you are in pain, you may need to take pain medicine before doing incentive spirometry. It is harder to take a deep breath if you are having pain. How to use your incentive spirometer  Sit up on the edge of your bed or on a chair. Hold the incentive spirometer so that it is in an upright position. Before you use the spirometer, breathe out normally. Place the mouthpiece in your mouth. Make sure your lips are closed tightly around it. Breathe in slowly and as deeply as you can through your mouth, causing the piston or the ball to rise toward the top of the chamber. Hold your breath for 3-5 seconds, or for as long as possible. If the spirometer includes a coach indicator, use this to guide you in breathing. Slow down your breathing if the indicator goes above the marked areas. Remove the mouthpiece from your mouth and breathe out normally. The piston or ball will return to the bottom of the chamber. Rest for a few seconds, then repeat the steps 10 or more times. Take your time and take a few normal breaths between deep breaths so that you do not get dizzy or light-headed. Do this every 1-2 hours when  you are awake. If the spirometer includes a goal marker to show the highest number you have reached (best effort), use this as a goal to work toward during each repetition. After each set of 10 deep breaths, cough a few times. This will help to make sure that your lungs are clear. If you have an incision on your chest or abdomen from surgery, place a pillow or a rolled-up towel firmly against the incision when you cough. This can help to reduce pain while taking deep breaths and coughing. General tips When you are able to get out of bed: Walk around often. Continue to take deep breaths and cough in order to clear your lungs. Keep using the incentive spirometer until your health care provider says it is okay to stop using it. If you have been in the hospital, you may be told to keep using the spirometer at home. Contact a health care provider if: You are having difficulty using the spirometer. You have trouble using the spirometer as often as instructed. Your pain medicine is not giving enough relief for you to use the spirometer as told. You have a fever. Get help right away if: You develop shortness of breath. You develop a cough with bloody mucus from the lungs. You have fluid or blood coming from an incision site after you cough. Summary An incentive spirometer is a tool that can help you learn to take long, deep breaths to keep your lungs clear and active. You may be asked to use a spirometer after a surgery, if you have a lung problem or a history of smoking, or if you have been inactive for a long period of time. Use your incentive spirometer as instructed every 1-2 hours while you are awake. If you have an incision on your chest or abdomen, place a pillow or a rolled-up towel firmly against your incision when you cough. This will help to reduce pain. Get help right away if you have shortness of breath, you cough up bloody mucus, or blood comes from your incision when you cough. This  information is not intended to replace advice given to you by  your health care provider. Make sure you discuss any questions you have with your health care provider. Document Revised: 04/28/2019 Document Reviewed: 04/28/2019 Elsevier Patient Education  2023 [employer].

## 2023-04-29 ENCOUNTER — Encounter: Payer: Self-pay | Admitting: Orthopedic Surgery

## 2023-04-29 NOTE — H&P (Signed)
 ORTHOPAEDIC HISTORY & PHYSICAL Raenette Rover, [place] - 04/25/2023 3:45 PM EST Formatting of this note is different from the original. NAME: Vernon Payne. H&P Date: 04/25/2023 Procedure Date: 04/30/2023  Chief Complaint: right knee pain  HPI Vernon Payne. is a 73 y.o. male who has severe Right knee pain. He states that he has had a longstanding history of right knee pain that has consistently gotten worse over the last few years. He states that most of the pain localizes to the medial aspect of his knee, but intermittently along the lateral aspect as well. He does report having intermittent swelling and some giving way of the knee. Denies any locking symptoms. He states that the pain is made worse with any prolonged standing, weightbearing or active to but he. It has begun to affect his ability to ambulate long distances and perform his ADLs. He has failed conservative treatment including Tylenol and activity modification. He is unable to tolerate oral NSAIDs secondary to use of Plavix. He is not currently utilizing any ambulatory aids . He has requested operative intervention for relief of his DJD symptoms. He denies any known cardiac or pulmonary history. Denies any history of DVTs, but does have a history of an ischemic stroke in 2023. Patient denies having any previous surgeries on his right knee, but has a left knee hemiarthroplasty that was performed by Dr. Gavin Potters. Patient is a diabetic, his last A1c was at 7.0.  Social Hx: Patient lives at home with his wife. He is retired. States that he has about 1 standard alcohol drink a month. Denies any illicit drug use. No smoking or nicotine use.  Medications & Allergies Allergies: Allergies Allergen Reactions Codeine Nausea And Vomiting Morphine Vomiting Percocet [Oxycodone-Acetaminophen] Vomiting  Home Medicines: Current Outpatient Medications on File Prior to Visit Medication Sig Dispense Refill aspirin 81 MG EC tablet Take 81 mg by  mouth once daily. blood-glucose meter,continuous (FREESTYLE LIBRE 3 READER) Misc Use 1 each as directed Use reader for glucose monitoring 1 each 1 blood-glucose sensor (FREESTYLE LIBRE 3 SENSOR) Use 1 each every 14 (fourteen) days Use sensor for blood glucose monitoring 6 each 3 calcium carbonate 500 mg calcium (1,250 mg) tablet Take 500 mg of elemental by mouth 2 (two) times daily with meals One a day. clopidogreL (PLAVIX) 75 mg tablet Take 1 tablet (75 mg total) by mouth once daily 90 tablet 3 cyanocobalamin (VITAMIN B12) 1000 MCG tablet Take 1,000 mcg by mouth once daily cyclobenzaprine (FLEXERIL) 5 MG tablet Take 1 tablet (5 mg total) by mouth 3 (three) times daily as needed for Muscle spasms 50 tablet 1 diphenhydrAMINE-acetaminophen (TYLENOL PM) 25-500 mg per tablet Take 2 tablets by mouth at bedtime dorzolamide (TRUSOPT) 2 % ophthalmic solution Place 2 drops into both eyes once daily dorzolamide-timoloL (COSOPT) 22.3-6.8 mg/mL ophthalmic solution Place 1 drop into both eyes 2 (two) times daily dulaglutide (TRULICITY) 0.75 mg/0.5 mL subcutaneous pen injector Inject 0.5 mLs (0.75 mg total) subcutaneously once a week for 180 days 2 mL 5 glipiZIDE (GLUCOTROL XL) 10 MG XL tablet Take 1 tablet (10 mg total) by mouth once daily 90 tablet 3 latanoprost (XALATAN) 0.005 % ophthalmic solution Place 1 drop into both eyes nightly. metFORMIN (GLUCOPHAGE) 500 MG tablet Take 2 tablets (1,000 mg total) by mouth 2 (two) times daily with meals 360 tablet 3 pantoprazole (PROTONIX) 40 MG DR tablet Take 1 tablet (40 mg total) by mouth once daily 90 tablet 3 PARoxetine (PAXIL) 10 MG tablet Take 1 tablet (10  mg total) by mouth once daily 90 tablet 3 simvastatin (ZOCOR) 20 MG tablet Take 1 tablet (20 mg total) by mouth at bedtime 90 tablet 3  No current facility-administered medications on file prior to visit.  Medical / Surgical History  Past Medical History: Diagnosis Date Chickenpox Diabetes  (CMS/HHS-HCC) Diabetes mellitus type 2, controlled, with complications (CMS/HHS-HCC) 10/15/2013 Glaucoma Hemorrhoids Hyperlipidemia Hypertension OSA on CPAP 01/24/2017 12/18, AHI 6, RDI 28, no hypoxia Osteoarthritis   Past Surgical History: Procedure Laterality Date COLONOSCOPY 12/13/2012 Hyperplastic Polyp: CBF 11/2022 JOINT REPLACEMENT Left 05/07/2013 Left knee MAKOplasty. ARTHROSCOPIC REMOVAL OF LOOSE BODIES PLUS LIMITED SYNOVECTOMY Left 08/15/2013 KNEE ARTHROSCOPY Left 08/15/2013 Colon [facility] 01/26/2023 Tubular adenoma/Hyperplastic polyps/Repeat 16yr/SMR APPENDECTOMY KYPHOPLASTY OTHER SURGERY spine PHOTOREFRACTIVE KERATOTOMY/LASIK bilateral TONSILLECTOMY   Physical Exam  Ht: Wt: BMI: There is no height or weight on file to calculate BMI.  General/Constitutional: No apparent distress: well-nourished and well developed. Eyes: Pupils equal, round with synchronous movement. Lymphatic: No palpable adenopathy. Respiratory: Patient has good chest rise and fall with inspiration and expiration. All lung fields are clear to auscultation bilaterally. There is no Rales, rhonchi or wheezes appreciated. Cardiovascular: Upon auscultation there is a regular rate and rhythm without any murmurs, rubs, gallops or heaves appreciated. There does not appear to be any swelling down the lower extremities. Posterior tibial pulses appreciated bilaterally, 2+. Integumentary: No impressive skin lesions present, except as noted in detailed exam. Neuro/Psych: Normal mood and affect, oriented to person, place and time. Musculoskeletal: see exam below  Right knee exam Upon inspection of the patient's right knee there does not appear to be any skin changes, open abrasions, swelling or redness. There is a varus alignment. Upon palpation, the patient reports having pain along the medial and lateral aspect of their knee. Patient has approximately 6 degrees off of full extension actively with ROM, and able  to flex back greater than 120 degrees with mild pain. Varus and valgus stress testing shows positive pseudolaxity to valgus stressing. The patella tracks well within the femoral groove from flexion into extension with crepitus appreciated. Anterior and posterior drawer testing negative. Patient is neurovascularly intact down their lower extremity to all dermatomes. Posterior tibial pulses appreciated 2+.  Imaging right Knee Imaging: No new images were ordered during his visit today. His previous films from 01/23/2023 were reviewed. There is significant narrowing noted along the medial cartilage space with 100% loss of cartilage space with bone-on-bone articulation. There is an overall associated varus alignment. There is osteophyte formation present. Subchondral changes are also appreciated. No fractures, lytic lesions or gross deformities appreciated on films.  Assesment and Plan Knee DJD  I have recommended that Vernon Payne. undergo right total knee replacement. Consents has been signed. The risks, benefits, prognosis and alternatives including but not limited to DVT, PE, infection, neurovascular injury, failure of the procedure and death were explained to the patient and he is willing to proceed with surgery as described to him by myself. Plan will be for post operative admission of at least 1 midnight for pain control and PT. He will be managed with DVT prophylaxis, antibiotics preoperatively for 24 hours and aggressive in patient rehab.  Pre, intra and post op interventions were discussed. Patient has good understanding  Medication Reconciliation was performed. Discussed cessation of clopidogrel, diabetes medications, vitamins and supplements. Of note, patient did have clopidogrel within the 7 days prior to surgery, and will most likely require general anesthesia. Patient states that he wanted to have general anesthesia electively  prior to this issue.  A total of 45 minutes was spent reviewing  patient's charts, medical reconciliation, discussing/educating the patient about surgical interventions, and answering any questions provided by the patient.  JOSHUA Kendrick Fries, PA [facility] 04/25/2023  Electronically signed by Raenette Rover, PA at 04/25/2023 5:08 PM EST

## 2023-04-30 ENCOUNTER — Ambulatory Visit: Admitting: Certified Registered Nurse Anesthetist

## 2023-04-30 ENCOUNTER — Other Ambulatory Visit: Payer: Self-pay

## 2023-04-30 ENCOUNTER — Observation Stay
Admission: RE | Admit: 2023-04-30 | Discharge: 2023-05-01 | Disposition: A | Discharge status: Home / Self Care | Payer: Medicare PPO | Attending: Orthopedic Surgery | Admitting: Orthopedic Surgery

## 2023-04-30 ENCOUNTER — Observation Stay

## 2023-04-30 ENCOUNTER — Encounter
Admission: RE | Disposition: A | Discharge status: Home / Self Care | Payer: Self-pay | Source: Home / Self Care | Attending: Orthopedic Surgery

## 2023-04-30 ENCOUNTER — Encounter: Payer: Self-pay | Admitting: Orthopedic Surgery

## 2023-04-30 DIAGNOSIS — E1169 Type 2 diabetes mellitus with other specified complication: Secondary | ICD-10-CM

## 2023-04-30 DIAGNOSIS — Z8673 Personal history of transient ischemic attack (TIA), and cerebral infarction without residual deficits: Secondary | ICD-10-CM | POA: Diagnosis not present

## 2023-04-30 DIAGNOSIS — Z79899 Other long term (current) drug therapy: Secondary | ICD-10-CM | POA: Diagnosis not present

## 2023-04-30 DIAGNOSIS — E119 Type 2 diabetes mellitus without complications: Secondary | ICD-10-CM | POA: Diagnosis not present

## 2023-04-30 DIAGNOSIS — Z85828 Personal history of other malignant neoplasm of skin: Secondary | ICD-10-CM | POA: Diagnosis not present

## 2023-04-30 DIAGNOSIS — M1711 Unilateral primary osteoarthritis, right knee: Principal | ICD-10-CM | POA: Insufficient documentation

## 2023-04-30 DIAGNOSIS — Z7982 Long term (current) use of aspirin: Secondary | ICD-10-CM | POA: Insufficient documentation

## 2023-04-30 DIAGNOSIS — Z7984 Long term (current) use of oral hypoglycemic drugs: Secondary | ICD-10-CM | POA: Insufficient documentation

## 2023-04-30 DIAGNOSIS — Z01812 Encounter for preprocedural laboratory examination: Secondary | ICD-10-CM

## 2023-04-30 DIAGNOSIS — Z7902 Long term (current) use of antithrombotics/antiplatelets: Secondary | ICD-10-CM | POA: Insufficient documentation

## 2023-04-30 DIAGNOSIS — I10 Essential (primary) hypertension: Secondary | ICD-10-CM | POA: Insufficient documentation

## 2023-04-30 DIAGNOSIS — Z96651 Presence of right artificial knee joint: Secondary | ICD-10-CM

## 2023-04-30 HISTORY — DX: Unilateral primary osteoarthritis, right knee: M17.11

## 2023-04-30 HISTORY — PX: KNEE ARTHROPLASTY: SHX992

## 2023-04-30 LAB — GLUCOSE, CAPILLARY
Glucose-Capillary: 152 mg/dL — ABNORMAL HIGH (ref 70–99)
Glucose-Capillary: 219 mg/dL — ABNORMAL HIGH (ref 70–99)
Glucose-Capillary: 252 mg/dL — ABNORMAL HIGH (ref 70–99)

## 2023-04-30 SURGERY — ARTHROPLASTY, KNEE, TOTAL, USING IMAGELESS COMPUTER-ASSISTED NAVIGATION
Anesthesia: Spinal | Site: Knee | Laterality: Right

## 2023-04-30 MED ORDER — PROPOFOL 10 MG/ML IV BOLUS
INTRAVENOUS | Status: DC | PRN
  Administered 2023-04-30: 160 mg via INTRAVENOUS
  Administered 2023-04-30: 40 mg via INTRAVENOUS

## 2023-04-30 MED ORDER — FENTANYL CITRATE (PF) 100 MCG/2ML IJ SOLN
INTRAMUSCULAR | Status: AC
  Filled 2023-04-30: qty 2

## 2023-04-30 MED ORDER — PROPOFOL 1000 MG/100ML IV EMUL
INTRAVENOUS | Status: AC
  Filled 2023-04-30: qty 100

## 2023-04-30 MED ORDER — DIPHENHYDRAMINE HCL 12.5 MG/5ML PO ELIX: 12.5000 mg | ORAL_SOLUTION | ORAL | Status: DC | PRN

## 2023-04-30 MED ORDER — DORZOLAMIDE HCL 2 % OP SOLN
1.0000 [drp] | Freq: Two times a day (BID) | OPHTHALMIC | Status: DC
  Administered 2023-04-30 – 2023-05-01 (×2): 1 [drp] via OPHTHALMIC
  Filled 2023-04-30: qty 10

## 2023-04-30 MED ORDER — GABAPENTIN 300 MG PO CAPS
ORAL_CAPSULE | ORAL | Status: AC
  Filled 2023-04-30: qty 1

## 2023-04-30 MED ORDER — MIDAZOLAM HCL 5 MG/5ML IJ SOLN
INTRAMUSCULAR | Status: DC | PRN
Start: 2023-04-30 — End: 2023-04-30
  Administered 2023-04-30: 1 mg via INTRAVENOUS

## 2023-04-30 MED ORDER — BUPIVACAINE HCL (PF) 0.25 % IJ SOLN
INTRAMUSCULAR | Status: DC | PRN
  Administered 2023-04-30: 60 mL

## 2023-04-30 MED ORDER — ACETAMINOPHEN 10 MG/ML IV SOLN
INTRAVENOUS | Status: DC | PRN
  Administered 2023-04-30: 1000 mg via INTRAVENOUS

## 2023-04-30 MED ORDER — CLOPIDOGREL BISULFATE 75 MG PO TABS
75.0000 mg | ORAL_TABLET | Freq: Every day | ORAL | Status: DC
  Administered 2023-05-01: 75 mg via ORAL
  Filled 2023-04-30: qty 1

## 2023-04-30 MED ORDER — ORAL CARE MOUTH RINSE: 15.0000 mL | Freq: Once | OROMUCOSAL | Status: AC

## 2023-04-30 MED ORDER — PAROXETINE HCL 20 MG PO TABS
10.0000 mg | ORAL_TABLET | Freq: Every day | ORAL | Status: DC
  Administered 2023-05-01: 10 mg via ORAL
  Filled 2023-04-30: qty 0.5

## 2023-04-30 MED ORDER — ONDANSETRON HCL 4 MG/2ML IJ SOLN: 4.0000 mg | Freq: Four times a day (QID) | INTRAMUSCULAR | Status: DC | PRN

## 2023-04-30 MED ORDER — PHENOL 1.4 % MT LIQD: 1.0000 | OROMUCOSAL | Status: DC | PRN

## 2023-04-30 MED ORDER — ALUM & MAG HYDROXIDE-SIMETH 200-200-20 MG/5ML PO SUSP: 30.0000 mL | ORAL | Status: DC | PRN

## 2023-04-30 MED ORDER — MENTHOL 3 MG MT LOZG: 1.0000 | LOZENGE | OROMUCOSAL | Status: DC | PRN

## 2023-04-30 MED ORDER — DEXAMETHASONE SODIUM PHOSPHATE 10 MG/ML IJ SOLN
INTRAMUSCULAR | Status: AC
  Filled 2023-04-30: qty 1

## 2023-04-30 MED ORDER — CELECOXIB 200 MG PO CAPS
200.0000 mg | ORAL_CAPSULE | Freq: Two times a day (BID) | ORAL | Status: DC
  Administered 2023-04-30 – 2023-05-01 (×2): 200 mg via ORAL
  Filled 2023-04-30 (×2): qty 1

## 2023-04-30 MED ORDER — SODIUM CHLORIDE 0.9 % IV SOLN
INTRAVENOUS | Status: DC | PRN
  Administered 2023-04-30: 60 mL

## 2023-04-30 MED ORDER — ONDANSETRON HCL 4 MG/2ML IJ SOLN
INTRAMUSCULAR | Status: DC | PRN
  Administered 2023-04-30: 4 mg via INTRAVENOUS

## 2023-04-30 MED ORDER — CHLORHEXIDINE GLUCONATE 4 % EX SOLN: 60.0000 mL | Freq: Once | CUTANEOUS | Status: DC

## 2023-04-30 MED ORDER — CEFAZOLIN SODIUM-DEXTROSE 2-4 GM/100ML-% IV SOLN
INTRAVENOUS | Status: AC
  Filled 2023-04-30: qty 100

## 2023-04-30 MED ORDER — ONDANSETRON HCL 4 MG PO TABS: 4.0000 mg | ORAL_TABLET | Freq: Four times a day (QID) | ORAL | Status: DC | PRN

## 2023-04-30 MED ORDER — CEFAZOLIN SODIUM-DEXTROSE 2-4 GM/100ML-% IV SOLN
2.0000 g | INTRAVENOUS | Status: AC
  Administered 2023-04-30: 2 g via INTRAVENOUS

## 2023-04-30 MED ORDER — ACETAMINOPHEN 325 MG PO TABS: 325.0000 mg | ORAL_TABLET | Freq: Four times a day (QID) | ORAL | Status: DC | PRN

## 2023-04-30 MED ORDER — TRAMADOL HCL 50 MG PO TABS: 50.0000 mg | ORAL_TABLET | ORAL | Status: DC | PRN

## 2023-04-30 MED ORDER — CHLORHEXIDINE GLUCONATE 0.12 % MT SOLN
15.0000 mL | Freq: Once | OROMUCOSAL | Status: AC
  Administered 2023-04-30: 15 mL via OROMUCOSAL

## 2023-04-30 MED ORDER — GABAPENTIN 300 MG PO CAPS
300.0000 mg | ORAL_CAPSULE | Freq: Once | ORAL | Status: AC
Start: 2023-04-30 — End: 2023-04-30
  Administered 2023-04-30: 300 mg via ORAL

## 2023-04-30 MED ORDER — SURGIPHOR WOUND IRRIGATION SYSTEM - OPTIME
TOPICAL | Status: DC | PRN
  Administered 2023-04-30: 450 mL via TOPICAL

## 2023-04-30 MED ORDER — METFORMIN HCL 500 MG PO TABS
1000.0000 mg | ORAL_TABLET | Freq: Two times a day (BID) | ORAL | Status: DC
  Administered 2023-04-30 – 2023-05-01 (×2): 1000 mg via ORAL

## 2023-04-30 MED ORDER — CHLORHEXIDINE GLUCONATE 0.12 % MT SOLN
OROMUCOSAL | Status: AC
Start: 2023-04-30 — End: ?
  Filled 2023-04-30: qty 15

## 2023-04-30 MED ORDER — PANTOPRAZOLE SODIUM 40 MG PO TBEC
40.0000 mg | DELAYED_RELEASE_TABLET | Freq: Two times a day (BID) | ORAL | Status: DC
  Administered 2023-04-30 – 2023-05-01 (×2): 40 mg via ORAL
  Filled 2023-04-30 (×2): qty 1

## 2023-04-30 MED ORDER — TRANEXAMIC ACID-NACL 1000-0.7 MG/100ML-% IV SOLN: 1000.0000 mg | INTRAVENOUS | Status: DC

## 2023-04-30 MED ORDER — OXYCODONE HCL 5 MG PO TABS: 10.0000 mg | ORAL_TABLET | ORAL | Status: DC | PRN

## 2023-04-30 MED ORDER — FENTANYL CITRATE (PF) 100 MCG/2ML IJ SOLN
INTRAMUSCULAR | Status: DC | PRN
  Administered 2023-04-30 (×4): 50 ug via INTRAVENOUS

## 2023-04-30 MED ORDER — PHENYLEPHRINE HCL-NACL 20-0.9 MG/250ML-% IV SOLN
INTRAVENOUS | Status: AC
  Filled 2023-04-30: qty 250

## 2023-04-30 MED ORDER — ONDANSETRON HCL 4 MG/2ML IJ SOLN
INTRAMUSCULAR | Status: AC
  Filled 2023-04-30: qty 2

## 2023-04-30 MED ORDER — SODIUM CHLORIDE 0.9 % IR SOLN
Status: DC | PRN
  Administered 2023-04-30: 3000 mL

## 2023-04-30 MED ORDER — ROCURONIUM BROMIDE 100 MG/10ML IV SOLN
INTRAVENOUS | Status: DC | PRN
  Administered 2023-04-30 (×2): 20 mg via INTRAVENOUS
  Administered 2023-04-30: 50 mg via INTRAVENOUS
  Administered 2023-04-30: 30 mg via INTRAVENOUS
  Administered 2023-04-30: 10 mg via INTRAVENOUS

## 2023-04-30 MED ORDER — BUPIVACAINE HCL (PF) 0.5 % IJ SOLN
INTRAMUSCULAR | Status: AC
  Filled 2023-04-30: qty 10

## 2023-04-30 MED ORDER — OXYCODONE HCL 5 MG PO TABS
ORAL_TABLET | ORAL | Status: AC
  Filled 2023-04-30: qty 1

## 2023-04-30 MED ORDER — DEXMEDETOMIDINE HCL IN NACL 80 MCG/20ML IV SOLN
INTRAVENOUS | Status: DC | PRN
  Administered 2023-04-30: 8 ug via INTRAVENOUS
  Administered 2023-04-30: 20 ug via INTRAVENOUS
  Administered 2023-04-30: 4 ug via INTRAVENOUS

## 2023-04-30 MED ORDER — TRANEXAMIC ACID-NACL 1000-0.7 MG/100ML-% IV SOLN
INTRAVENOUS | Status: DC | PRN
  Administered 2023-04-30: 1000 mg via INTRAVENOUS

## 2023-04-30 MED ORDER — MIDAZOLAM HCL 2 MG/2ML IJ SOLN
INTRAMUSCULAR | Status: AC
  Filled 2023-04-30: qty 2

## 2023-04-30 MED ORDER — MAGNESIUM HYDROXIDE 400 MG/5ML PO SUSP
30.0000 mL | Freq: Every day | ORAL | Status: DC
  Filled 2023-04-30: qty 30

## 2023-04-30 MED ORDER — BISACODYL 10 MG RE SUPP: 10.0000 mg | Freq: Every day | RECTAL | Status: DC | PRN

## 2023-04-30 MED ORDER — OXYCODONE HCL 5 MG PO TABS: 5.0000 mg | ORAL_TABLET | ORAL | Status: DC | PRN

## 2023-04-30 MED ORDER — SUGAMMADEX SODIUM 200 MG/2ML IV SOLN
INTRAVENOUS | Status: AC
  Filled 2023-04-30: qty 2

## 2023-04-30 MED ORDER — SODIUM CHLORIDE 0.9 % IV SOLN: INTRAVENOUS | Status: DC | PRN

## 2023-04-30 MED ORDER — SENNOSIDES-DOCUSATE SODIUM 8.6-50 MG PO TABS
1.0000 | ORAL_TABLET | Freq: Two times a day (BID) | ORAL | Status: DC
  Administered 2023-04-30 – 2023-05-01 (×2): 1 via ORAL
  Filled 2023-04-30 (×2): qty 1

## 2023-04-30 MED ORDER — HYDROMORPHONE HCL 1 MG/ML IJ SOLN: 0.5000 mg | INTRAMUSCULAR | Status: DC | PRN

## 2023-04-30 MED ORDER — LATANOPROST 0.005 % OP SOLN
1.0000 [drp] | Freq: Every day | OPHTHALMIC | Status: DC
  Administered 2023-04-30: 1 [drp] via OPHTHALMIC
  Filled 2023-04-30: qty 2.5

## 2023-04-30 MED ORDER — SODIUM CHLORIDE 0.9 % IV SOLN: INTRAVENOUS | Status: DC

## 2023-04-30 MED ORDER — TRANEXAMIC ACID-NACL 1000-0.7 MG/100ML-% IV SOLN
INTRAVENOUS | Status: AC
  Filled 2023-04-30: qty 100

## 2023-04-30 MED ORDER — ACETAMINOPHEN 10 MG/ML IV SOLN: 1000.0000 mg | Freq: Once | INTRAVENOUS | Status: DC | PRN

## 2023-04-30 MED ORDER — ASPIRIN 81 MG PO CHEW
81.0000 mg | CHEWABLE_TABLET | Freq: Two times a day (BID) | ORAL | Status: DC
  Administered 2023-05-01: 81 mg via ORAL
  Filled 2023-04-30: qty 1

## 2023-04-30 MED ORDER — CELECOXIB 200 MG PO CAPS
ORAL_CAPSULE | ORAL | Status: AC
  Filled 2023-04-30: qty 2

## 2023-04-30 MED ORDER — FERROUS SULFATE 325 (65 FE) MG PO TABS
325.0000 mg | ORAL_TABLET | Freq: Two times a day (BID) | ORAL | Status: DC
  Administered 2023-04-30 – 2023-05-01 (×2): 325 mg via ORAL
  Filled 2023-04-30 (×2): qty 1

## 2023-04-30 MED ORDER — ACETAMINOPHEN 10 MG/ML IV SOLN
1000.0000 mg | Freq: Four times a day (QID) | INTRAVENOUS | Status: DC
Start: 2023-04-30 — End: 2023-05-01
  Administered 2023-04-30 – 2023-05-01 (×3): 1000 mg via INTRAVENOUS
  Filled 2023-04-30 (×2): qty 100

## 2023-04-30 MED ORDER — LABETALOL HCL 5 MG/ML IV SOLN
INTRAVENOUS | Status: DC | PRN
  Administered 2023-04-30: 5 mg via INTRAVENOUS

## 2023-04-30 MED ORDER — DORZOLAMIDE HCL-TIMOLOL MAL 2-0.5 % OP SOLN: 1.0000 [drp] | Freq: Two times a day (BID) | OPHTHALMIC | Status: DC

## 2023-04-30 MED ORDER — EPHEDRINE SULFATE-NACL 50-0.9 MG/10ML-% IV SOSY
PREFILLED_SYRINGE | INTRAVENOUS | Status: DC | PRN
  Administered 2023-04-30: 5 mg via INTRAVENOUS

## 2023-04-30 MED ORDER — OXYCODONE HCL 5 MG/5ML PO SOLN: 5.0000 mg | Freq: Once | ORAL | Status: AC | PRN

## 2023-04-30 MED ORDER — INSULIN ASPART 100 UNIT/ML IJ SOLN
0.0000 [IU] | Freq: Every day | INTRAMUSCULAR | Status: DC
  Administered 2023-04-30: 5 [IU] via SUBCUTANEOUS
  Filled 2023-04-30: qty 1

## 2023-04-30 MED ORDER — METFORMIN HCL 500 MG PO TABS
ORAL_TABLET | ORAL | Status: AC
  Filled 2023-04-30: qty 2

## 2023-04-30 MED ORDER — FENTANYL CITRATE (PF) 100 MCG/2ML IJ SOLN
25.0000 ug | INTRAMUSCULAR | Status: DC | PRN
  Administered 2023-04-30: 25 ug via INTRAVENOUS

## 2023-04-30 MED ORDER — DEXMEDETOMIDINE HCL IN NACL 80 MCG/20ML IV SOLN
INTRAVENOUS | Status: AC
  Filled 2023-04-30: qty 20

## 2023-04-30 MED ORDER — DROPERIDOL 2.5 MG/ML IJ SOLN: 0.6250 mg | Freq: Once | INTRAMUSCULAR | Status: DC | PRN

## 2023-04-30 MED ORDER — CEFAZOLIN SODIUM-DEXTROSE 2-4 GM/100ML-% IV SOLN
2.0000 g | Freq: Four times a day (QID) | INTRAVENOUS | Status: AC
  Administered 2023-04-30 (×2): 2 g via INTRAVENOUS
  Filled 2023-04-30 (×2): qty 100

## 2023-04-30 MED ORDER — FLEET ENEMA RE ENEM: 1.0000 | ENEMA | Freq: Once | RECTAL | Status: DC | PRN

## 2023-04-30 MED ORDER — INSULIN ASPART 100 UNIT/ML IJ SOLN
0.0000 [IU] | Freq: Three times a day (TID) | INTRAMUSCULAR | Status: DC
  Administered 2023-05-01: 3 [IU] via SUBCUTANEOUS
  Administered 2023-05-01: 5 [IU] via SUBCUTANEOUS
  Filled 2023-04-30 (×2): qty 1

## 2023-04-30 MED ORDER — TRANEXAMIC ACID-NACL 1000-0.7 MG/100ML-% IV SOLN
1000.0000 mg | Freq: Once | INTRAVENOUS | Status: AC
  Administered 2023-04-30: 1000 mg via INTRAVENOUS

## 2023-04-30 MED ORDER — TIMOLOL MALEATE 0.5 % OP SOLN
1.0000 [drp] | Freq: Two times a day (BID) | OPHTHALMIC | Status: DC
  Filled 2023-04-30: qty 5

## 2023-04-30 MED ORDER — DEXAMETHASONE SODIUM PHOSPHATE 10 MG/ML IJ SOLN
8.0000 mg | Freq: Once | INTRAMUSCULAR | Status: AC
Start: 2023-04-30 — End: 2023-04-30
  Administered 2023-04-30: 8 mg via INTRAVENOUS

## 2023-04-30 MED ORDER — CELECOXIB 200 MG PO CAPS
400.0000 mg | ORAL_CAPSULE | Freq: Once | ORAL | Status: AC
  Administered 2023-04-30: 400 mg via ORAL

## 2023-04-30 MED ORDER — OXYCODONE HCL 5 MG PO TABS
5.0000 mg | ORAL_TABLET | Freq: Once | ORAL | Status: AC | PRN
  Administered 2023-04-30: 5 mg via ORAL

## 2023-04-30 MED ORDER — METOCLOPRAMIDE HCL 10 MG PO TABS
10.0000 mg | ORAL_TABLET | Freq: Three times a day (TID) | ORAL | Status: DC
  Administered 2023-04-30 – 2023-05-01 (×3): 10 mg via ORAL
  Filled 2023-04-30 (×3): qty 1

## 2023-04-30 MED ORDER — GLIPIZIDE ER 10 MG PO TB24
10.0000 mg | ORAL_TABLET | Freq: Every day | ORAL | Status: DC
  Administered 2023-04-30 – 2023-05-01 (×2): 10 mg via ORAL
  Filled 2023-04-30 (×2): qty 1

## 2023-04-30 MED ORDER — SIMVASTATIN 20 MG PO TABS
20.0000 mg | ORAL_TABLET | Freq: Every day | ORAL | Status: DC
  Administered 2023-04-30: 20 mg via ORAL
  Filled 2023-04-30: qty 1

## 2023-04-30 MED ORDER — PHENYLEPHRINE HCL-NACL 20-0.9 MG/250ML-% IV SOLN
INTRAVENOUS | Status: DC | PRN
  Administered 2023-04-30: 35 ug/min via INTRAVENOUS

## 2023-04-30 MED ORDER — ACETAMINOPHEN 10 MG/ML IV SOLN
INTRAVENOUS | Status: AC
  Filled 2023-04-30: qty 100

## 2023-04-30 MED ORDER — GLYCOPYRROLATE 0.2 MG/ML IJ SOLN
INTRAMUSCULAR | Status: DC | PRN
  Administered 2023-04-30: .2 mg via INTRAVENOUS

## 2023-04-30 MED ORDER — SUGAMMADEX SODIUM 200 MG/2ML IV SOLN
INTRAVENOUS | Status: DC | PRN
  Administered 2023-04-30: 100 mg via INTRAVENOUS

## 2023-04-30 MED ORDER — LIDOCAINE HCL (CARDIAC) PF 100 MG/5ML IV SOSY
PREFILLED_SYRINGE | INTRAVENOUS | Status: DC | PRN
  Administered 2023-04-30: 100 mg via INTRAVENOUS

## 2023-04-30 SURGICAL SUPPLY — 67 items
ATTUNE MED DOME PAT 41 KNEE (Knees) IMPLANT
ATTUNE PS FEM RT SZ9 CEM KNEE (Femur) IMPLANT
BASE TIBIAL ROT PLAT SZ 10 KNE (Miscellaneous) IMPLANT
BATTERY INSTRU NAVIGATION (MISCELLANEOUS) ×4 IMPLANT
BIT DRILL QUICK REL 1/8 2PK SL (BIT) ×1 IMPLANT
BLADE CLIPPER SURG (BLADE) IMPLANT
BLADE SAW 70X12.5 (BLADE) ×1 IMPLANT
BLADE SAW 90X13X1.19 OSCILLAT (BLADE) ×1 IMPLANT
BLADE SAW 90X25X1.19 OSCILLAT (BLADE) ×1 IMPLANT
BONE CEMENT GENTAMICIN (Cement) ×2 IMPLANT
BRUSH SCRUB EZ PLAIN DRY (MISCELLANEOUS) ×1 IMPLANT
CEMENT BONE GENTAMICIN 40 (Cement) IMPLANT
COOLER POLAR GLACIER W/PUMP (MISCELLANEOUS) ×1 IMPLANT
CUFF TRNQT CYL 24X4X16.5-23 (TOURNIQUET CUFF) IMPLANT
CUFF TRNQT CYL 30X4X21-28X (TOURNIQUET CUFF) IMPLANT
DRAPE SHEET LG 3/4 BI-LAMINATE (DRAPES) ×1 IMPLANT
DRSG AQUACEL AG ADV 3.5X14 (GAUZE/BANDAGES/DRESSINGS) ×1 IMPLANT
DRSG MEPILEX SACRM 8.7X9.8 (GAUZE/BANDAGES/DRESSINGS) ×1 IMPLANT
DRSG TEGADERM 4X4.75 (GAUZE/BANDAGES/DRESSINGS) ×1 IMPLANT
DRSG XEROFORM 1X8 (GAUZE/BANDAGES/DRESSINGS) IMPLANT
DURAPREP 26ML APPLICATOR (WOUND CARE) ×2 IMPLANT
ELECT CAUTERY BLADE 6.4 (BLADE) ×1 IMPLANT
ELECT REM PT RETURN 9FT ADLT (ELECTROSURGICAL) ×1 IMPLANT
ELECTRODE REM PT RTRN 9FT ADLT (ELECTROSURGICAL) ×1 IMPLANT
EVACUATOR 1/8 PVC DRAIN (DRAIN) ×1 IMPLANT
EX-PIN ORTHOLOCK NAV 4X150 (PIN) ×2 IMPLANT
GAUZE XEROFORM 1X8 LF (GAUZE/BANDAGES/DRESSINGS) ×1 IMPLANT
GLOVE BIOGEL M STRL SZ7.5 (GLOVE) ×6 IMPLANT
GLOVE SURG UNDER POLY LF SZ8 (GLOVE) ×2 IMPLANT
GOWN STRL REUS W/ TWL LRG LVL3 (GOWN DISPOSABLE) ×1 IMPLANT
GOWN STRL REUS W/ TWL XL LVL3 (GOWN DISPOSABLE) ×1 IMPLANT
GOWN TOGA ZIPPER T7+ PEEL AWAY (MISCELLANEOUS) ×1 IMPLANT
HOLDER FOLEY CATH W/STRAP (MISCELLANEOUS) ×1 IMPLANT
HOOD PEEL AWAY T7 (MISCELLANEOUS) ×1 IMPLANT
INSERT TIBIAL ATTUNE SZ9 5MM (Insert) IMPLANT
IV NS IRRIG 3000ML ARTHROMATIC (IV SOLUTION) ×1 IMPLANT
KIT TURNOVER KIT A (KITS) ×1 IMPLANT
KNIFE SCULPS 14X20 (INSTRUMENTS) ×1 IMPLANT
MANIFOLD NEPTUNE II (INSTRUMENTS) ×2 IMPLANT
NDL SPNL 20GX3.5 QUINCKE YW (NEEDLE) ×2 IMPLANT
NEEDLE SPNL 20GX3.5 QUINCKE YW (NEEDLE) ×2 IMPLANT
PACK TOTAL KNEE (MISCELLANEOUS) ×1 IMPLANT
PAD ABD DERMACEA PRESS 5X9 (GAUZE/BANDAGES/DRESSINGS) ×2 IMPLANT
PAD ARMBOARD 7.5X6 YLW CONV (MISCELLANEOUS) ×3 IMPLANT
PAD WRAPON POLAR KNEE (MISCELLANEOUS) ×1 IMPLANT
PENCIL SMOKE EVACUATOR COATED (MISCELLANEOUS) ×1 IMPLANT
PIN DRILL FIX HALF THREAD (BIT) ×2 IMPLANT
PIN FIXATION 1/8DIA X 3INL (PIN) ×1 IMPLANT
PULSAVAC PLUS IRRIG FAN TIP (DISPOSABLE) ×1 IMPLANT
SOLUTION IRRIG SURGIPHOR (IV SOLUTION) ×1 IMPLANT
SPONGE DRAIN TRACH 4X4 STRL 2S (GAUZE/BANDAGES/DRESSINGS) ×1 IMPLANT
STAPLER SKIN PROX 35W (STAPLE) ×1 IMPLANT
STOCKINETTE STRL BIAS CUT 8X4 (MISCELLANEOUS) ×1 IMPLANT
STRAP TIBIA SHORT (MISCELLANEOUS) ×1 IMPLANT
SUCTION TUBE FRAZIER 10FR DISP (SUCTIONS) ×1 IMPLANT
SUT VIC AB 0 CT1 36 (SUTURE) ×1 IMPLANT
SUT VIC AB 1 CT1 36 (SUTURE) ×2 IMPLANT
SUT VIC AB 2-0 CT2 27 (SUTURE) ×1 IMPLANT
SYR 30ML LL (SYRINGE) ×2 IMPLANT
TIBIAL BASE ROT PLAT SZ 10 KNE (Miscellaneous) ×1 IMPLANT
TIP FAN IRRIG PULSAVAC PLUS (DISPOSABLE) ×1 IMPLANT
TOWEL OR 17X26 4PK STRL BLUE (TOWEL DISPOSABLE) ×1 IMPLANT
TOWER CARTRIDGE SMART MIX (DISPOSABLE) ×1 IMPLANT
TRAP FLUID SMOKE EVACUATOR (MISCELLANEOUS) ×1 IMPLANT
TRAY FOLEY MTR SLVR 16FR STAT (SET/KITS/TRAYS/PACK) ×1 IMPLANT
WATER STERILE IRR 1000ML POUR (IV SOLUTION) ×1 IMPLANT
WRAPON POLAR PAD KNEE (MISCELLANEOUS) ×1 IMPLANT

## 2023-04-30 NOTE — Interval H&P Note (Signed)
 History and Physical Interval Note:  04/30/2023 10:31 AM  Vernon Payne.  has presented today for surgery, with the diagnosis of Primary osteoarthritis of right knee M17.11.  The various methods of treatment have been discussed with the patient and family. After consideration of risks, benefits and other options for treatment, the patient has consented to  Procedure(s): ARTHROPLASTY, KNEE, TOTAL, USING IMAGELESS COMPUTER-ASSISTED NAVIGATION (Right) as a surgical intervention.  The patient's history has been reviewed, patient examined, no change in status, stable for surgery.  I have reviewed the patient's chart and labs.  Questions were answered to the patient's satisfaction.     Mosetta Ferdinand P Yasmin Bronaugh

## 2023-04-30 NOTE — Anesthesia Preprocedure Evaluation (Signed)
 Anesthesia Evaluation  Patient identified by MRN, date of birth, ID band Patient awake    Reviewed: Allergy & Precautions, H&P , NPO status , Patient's Chart, lab work & pertinent test results  Airway Mallampati: II  TM Distance: >3 FB Neck ROM: full    Dental   Pulmonary sleep apnea  Right hemiparesis    Pulmonary exam normal        Cardiovascular hypertension, Normal cardiovascular exam  EKG 04/2023: Sinus rhythm with 1st degree A-V block  ECHO 07/2021:  1. Left ventricular ejection fraction, by estimation, is 55 to 60%. The  left ventricle has normal function. The left ventricle has no regional  wall motion abnormalities. Left ventricular diastolic parameters are  consistent with Grade I diastolic  dysfunction (impaired relaxation).   2. Right ventricular systolic function is normal. The right ventricular  size is normal.   3. The mitral valve is normal in structure. Mild mitral valve  regurgitation. No evidence of mitral stenosis.   4. The aortic valve is normal in structure. Aortic valve regurgitation is  not visualized. No aortic stenosis is present.   5. The inferior vena cava is normal in size with greater than 50%  respiratory variability, suggesting right atrial pressure of 3 mmHg.     Neuro/Psych CVA (07/2021)  negative psych ROS   GI/Hepatic Neg liver ROS,GERD  ,,  Endo/Other  diabetes    Renal/GU      Musculoskeletal  (+) Arthritis ,    Abdominal   Peds  Hematology negative hematology ROS (+)   Anesthesia Other Findings Past Medical History: No date: Actinic keratosis No date: Arthritis No date: Diabetes mellitus without complication (HCC) No date: GERD (gastroesophageal reflux disease) No date: Hard of hearing     Comment:  left hearing aid No date: HLD (hyperlipidemia) No date: HTN (hypertension) No date: Hypotension, postural No date: IBS (irritable bowel syndrome) No date: Primary  osteoarthritis of right knee No date: Sleep apnea 01/20/2010: Squamous cell carcinoma of skin     Comment:  Right chest. Well differentiated. Excised: 01/27/2010 07/15/2019: Squamous cell carcinoma of skin     Comment:  R preauricular ant to lobe No date: Stroke [facility])     Comment:  May 31/ 2023 No date: Wears dentures     Comment:  partial upper, implants on bottom  Past Surgical History: No date: APPENDECTOMY No date: BACK SURGERY     Comment:  lumbar 08/29/2017: CATARACT EXTRACTION W/PHACO; Right     Comment:  Procedure: CATARACT EXTRACTION PHACO AND INTRAOCULAR               LENS PLACEMENT (IOC) RIGHT DIABETIC;  Surgeon:               Lockie Mola, MD;  Location: [facility] SUR[facility]ERY [facility];              Service: Ophthalmology;  Laterality: Right;  ISTENT               INJECT 11/21/2017: CATARACT EXTRACTION W/PHACO; Left     Comment:  Procedure: CATARACT EXTRACTION PHACO AND INTRAOCULAR               LENS PLACEMENT (IOC);  Surgeon: Lockie Mola, MD;              Location: [facility] SUR[facility]ERY [facility];  Service: Ophthalmology;                Laterality: Left; 01/26/2023: COLONOSCOPY WITH PROPOFOL; N/A     Comment:  Procedure: COLONOSCOPY WITH PROPOFOL;  Surgeon: Jaynie Collins, DO;  Location: [facility] ENDOSCOPY;  Service:               Gastroenterology;  Laterality: N/A; 11/21/2017: EYE SURGERY; Left     Comment:  cataract excision 08/29/2017: INSERTION OF ANTERIOR SEGMENT AQUEOUS DRAINAGE DEVICE  (ISTENT); Right     Comment:  Procedure: (ISTENT);  Surgeon: Lockie Mola, MD;              Location: [facility];  Service: Ophthalmology;                Laterality: Right;  Diabetic - oral meds 11/21/2017: INSERTION OF ANTERIOR SEGMENT AQUEOUS DRAINAGE DEVICE  (ISTENT); Left     Comment:  Procedure: INSERTION OF ANTERIOR SEGMENT AQUEOUS               DRAINAGE DEVICE (ISTENT)  INJECT DIABETES ISTENT INJECT;               Surgeon: Lockie Mola,  MD;  Location: [facility]               SURGERY [facility]TR;  Service: Ophthalmology;  Laterality: Left;              Diabetic - oral meds No date: JOINT REPLACEMENT     Comment:  left knee replacement 03/28/2019: KYPHOPLASTY; N/A     Comment:  Procedure: L1 KYPHOPLASTY;  Surgeon: Kennedy Bucker, MD;               Location: [facility] ORS;  Service: Orthopedics;  Laterality:               N/A; No date: LASIK 01/26/2023: POLYPECTOMY     Comment:  Procedure: POLYPECTOMY;  Surgeon: Jaynie Collins,              DO;  Location: [facility] ENDOSCOPY;  Service:               Gastroenterology;; No date: ROTATOR CUFF REPAIR No date: TONSILLECTOMY     Reproductive/Obstetrics negative OB ROS                              Anesthesia Physical Anesthesia Plan  ASA: 3  Anesthesia Plan: Spinal   Post-op Pain Management:    Induction: Intravenous  PONV Risk Score and Plan: Propofol infusion  Airway Management Planned:   Additional Equipment:   Intra-op Plan:   Post-operative Plan:   Informed Consent:      Dental Advisory Given  Plan Discussed with: CRNA and Surgeon  Anesthesia Plan Comments:          Anesthesia Quick Evaluation

## 2023-04-30 NOTE — Op Note (Signed)
 OPERATIVE NOTE  DATE OF SURGERY:  04/30/2023  PATIENT NAME:  Vernon Payne.   DOB: [birth year]  MRN: [id number]  PRE-OPERATIVE DIAGNOSIS: Degenerative arthrosis of the right knee, primary  POST-OPERATIVE DIAGNOSIS:  Same  PROCEDURE:  Right total knee arthroplasty using computer-assisted navigation  SURGEON:  Jena Gauss. M.D.  ASSISTANT:  Gean Birchwood, PA-C (present and scrubbed throughout the case, critical for assistance with exposure, retraction, instrumentation, and closure)  ANESTHESIA: general  ESTIMATED BLOOD LOSS: 50 mL  FLUIDS REPLACED: 800 mL of crystalloid  TOURNIQUET TIME: 88 minutes  DRAINS: 2 medium Hemovac drains  SOFT TISSUE RELEASES: Anterior cruciate ligament, posterior cruciate ligament, deep medial collateral ligament, patellofemoral ligament  IMPLANTS UTILIZED: DePuy Attune size 9 posterior stabilized femoral component (cemented), size 10 rotating platform tibial component (cemented), 41 mm medialized dome patella (cemented), and a 5 mm stabilized rotating platform polyethylene insert.  INDICATIONS FOR SURGERY: Vernon Payne. is a 73 y.o. year old adult with a long history of progressive knee pain. X-rays demonstrated severe degenerative changes in tricompartmental fashion. The patient had not seen any significant improvement despite conservative nonsurgical intervention. After discussion of the risks and benefits of surgical intervention, the patient expressed understanding of the risks benefits and agree with plans for total knee arthroplasty.   The risks, benefits, and alternatives were discussed at length including but not limited to the risks of infection, bleeding, nerve injury, stiffness, blood clots, the need for revision surgery, cardiopulmonary complications, among others, and they were willing to proceed.  PROCEDURE IN DETAIL: The patient was brought into the operating room and, after adequate general anesthesia was achieved, a tourniquet  was placed on the patient's upper thigh. The patient's knee and leg were cleaned and prepped with alcohol and DuraPrep and draped in the usual sterile fashion. A "timeout" was performed as per usual protocol. The lower extremity was exsanguinated using an Esmarch, and the tourniquet was inflated to 300 mmHg. An anterior longitudinal incision was made followed by a standard mid vastus approach. The deep fibers of the medial collateral ligament were elevated in a subperiosteal fashion off of the medial flare of the tibia so as to maintain a continuous soft tissue sleeve. The patella was subluxed laterally and the patellofemoral ligament was incised. Inspection of the knee demonstrated severe degenerative changes with full-thickness loss of articular cartilage. Osteophytes were debrided using a rongeur. Anterior and posterior cruciate ligaments were excised. Two 4.0 mm Schanz pins were inserted in the femur and into the tibia for attachment of the array of trackers used for computer-assisted navigation. Hip center was identified using a circumduction technique. Distal landmarks were mapped using the computer. The distal femur and proximal tibia were mapped using the computer. The distal femoral cutting guide was positioned using computer-assisted navigation so as to achieve a 5 distal valgus cut. The femur was sized and it was felt that a size 9 femoral component was appropriate. A size 9 femoral cutting guide was positioned and the anterior cut was performed and verified using the computer. This was followed by completion of the posterior and chamfer cuts. Femoral cutting guide for the central box was then positioned in the center box cut was performed.  Attention was then directed to the proximal tibia. Medial and lateral menisci were excised. The extramedullary tibial cutting guide was positioned using computer-assisted navigation so as to achieve a 0 varus-valgus alignment and 3 posterior slope. The cut was  performed and verified using the computer. The  proximal tibia was sized and it was felt that a size 10 tibial tray was appropriate. Tibial and femoral trials were inserted followed by insertion of a 5 mm polyethylene insert. This allowed for excellent mediolateral soft tissue balancing both in flexion and in full extension. Finally, the patella was cut and prepared so as to accommodate a 41 mm medialized dome patella. A patella trial was placed and the knee was placed through a range of motion with excellent patellar tracking appreciated. The femoral trial was removed after debridement of posterior osteophytes. The central post-hole for the tibial component was reamed followed by insertion of a keel punch. Tibial trials were then removed. Cut surfaces of bone were irrigated with copious amounts of normal saline using pulsatile lavage and then suctioned dry. Polymethylmethacrylate cement with gentamicin was prepared in the usual fashion using a vacuum mixer. Cement was applied to the cut surface of the proximal tibia as well as along the undersurface of a size 10 rotating platform tibial component. Tibial component was positioned and impacted into place. Excess cement was removed using Personal assistant. Cement was then applied to the cut surfaces of the femur as well as along the posterior flanges of the size 9 femoral component. The femoral component was positioned and impacted into place. Excess cement was removed using Personal assistant. A 5 mm polyethylene trial was inserted and the knee was brought into full extension with steady axial compression applied. Finally, cement was applied to the backside of a 41 mm medialized dome patella and the patellar component was positioned and patellar clamp applied. Excess cement was removed using Personal assistant. After adequate curing of the cement, the tourniquet was deflated after a total tourniquet time of 88 minutes. Hemostasis was achieved using electrocautery. The knee  was irrigated with copious amounts of normal saline using pulsatile lavage followed by 450 ml of Surgiphor and then suctioned dry. 20 mL of 1.3% Exparel and 60 mL of 0.25% Marcaine in 40 mL of normal saline was injected along the posterior capsule, medial and lateral gutters, and along the arthrotomy site. A 5 mm stabilized rotating platform polyethylene insert was inserted and the knee was placed through a range of motion with excellent mediolateral soft tissue balancing appreciated and excellent patellar tracking noted. 2 medium drains were placed in the wound bed and brought out through separate stab incisions. The medial parapatellar portion of the incision was reapproximated using interrupted sutures of #1 Vicryl. Subcutaneous tissue was approximated in layers using first #0 Vicryl followed #2-0 Vicryl. The skin was approximated with skin staples. A sterile dressing was applied.  The patient tolerated the procedure well and was transported to the recovery room in stable condition.    Vernon Payne P. Angie Fava., M.D.

## 2023-04-30 NOTE — Discharge Summary (Signed)
 Physician Discharge Summary  Subjective: 1 Day Post-Op Procedure(s) (LRB): ARTHROPLASTY, KNEE, TOTAL, USING IMAGELESS COMPUTER-ASSISTED NAVIGATION (Right) Patient reports pain as mild.   Patient seen in rounds with Dr. Ernest Pine. Patient is well, and has had no acute complaints or problems. Denies any CP, SOB, N/V, fevers or chills We will start therapy today.  Patient is ready to go home  Physician Discharge Summary  Patient ID: Vernon Payne. MRN: [id number] DOB/AGE: 07/02/1950 73 y.o.  Admit date: 04/30/2023 Discharge date: 05/01/2023  Admission Diagnoses:  Discharge Diagnoses:  Principal Problem:   History of total knee arthroplasty, right   Discharged Condition: good  Hospital Course: Patient presented to the hospital on 04/30/2023 for an elective right total knee arthroplasty performed by Dr. Ernest Pine. Patient was given 1g of TXA and 2g of Ancef prior to the procedure. he  tolerated the procedure well without any complications. See procedural note below for details. Postoperatively, the patient did very well. he  was able to pass PT protocols on post-op day one without any issues. JP drain was removed without any difficulty and was intact. he  was able to void his bladder without any difficulty. Physical exam was unremarkable. he  denies any SOB, CP, N/V, fevers or chills. Vital signs are stable. Patient is stable to discharge home.  PROCEDURE:  Right total knee arthroplasty using computer-assisted navigation   SURGEON:  Jena Gauss. M.D.   ASSISTANT:  Gean Birchwood, PA-C (present and scrubbed throughout the case, critical for assistance with exposure, retraction, instrumentation, and closure)   ANESTHESIA: general   ESTIMATED BLOOD LOSS: 50 mL   FLUIDS REPLACED: 800 mL of crystalloid   TOURNIQUET TIME: 88 minutes   DRAINS: 2 medium Hemovac drains   SOFT TISSUE RELEASES: Anterior cruciate ligament, posterior cruciate ligament, deep medial collateral ligament,  patellofemoral ligament   IMPLANTS UTILIZED: DePuy Attune size 9 posterior stabilized femoral component (cemented), size 10 rotating platform tibial component (cemented), 41 mm medialized dome patella (cemented), and a 5 mm stabilized rotating platform polyethylene insert.  Treatments: none  Discharge Exam: Blood pressure (!) 154/79, pulse 87, temperature 98.2 F (36.8 C), temperature source Temporal, resp. rate 16, height 6\' 4"  (1.93 m), weight 93 kg, SpO2 97%.   Disposition: home   Allergies as of 05/01/2023       Reactions   Morphine And Codeine Nausea And Vomiting   Got very confused" pt stated        Medication List     STOP taking these medications    aspirin EC 81 MG tablet Replaced by: aspirin 81 MG chewable tablet       TAKE these medications    aspirin 81 MG chewable tablet Chew 1 tablet (81 mg total) by mouth 2 (two) times daily. Replaces: aspirin EC 81 MG tablet   calcium elemental as carbonate 400 MG chewable tablet Commonly known as: BARIATRIC TUMS ULTRA Chew 1,200 mg by mouth daily.   celecoxib 200 MG capsule Commonly known as: CELEBREX Take 1 capsule (200 mg total) by mouth 2 (two) times daily.   clopidogrel 75 MG tablet Commonly known as: PLAVIX Take 1 tablet (75 mg total) by mouth daily.   cyanocobalamin 1000 MCG tablet Commonly known as: VITAMIN B12 Take 1,000 mcg by mouth daily.   diphenhydramine-acetaminophen 25-500 MG Tabs tablet Commonly known as: TYLENOL PM Take 2 tablets by mouth at bedtime.   dorzolamide-timolol 2-0.5 % ophthalmic solution Commonly known as: COSOPT Place 1 drop into both eyes 2 (  two) times daily.   fluorouracil 5 % cream Commonly known as: EFUDEX Apply topically at bedtime. qhs to aa groin until area starts to get red and irritated then d/c for a week and after a week restart qhs   glipiZIDE 10 MG 24 hr tablet Commonly known as: GLUCOTROL XL Take 10 mg by mouth daily.   latanoprost 0.005 % ophthalmic  solution Commonly known as: XALATAN Place 1 drop into both eyes at bedtime.   metFORMIN 500 MG tablet Commonly known as: GLUCOPHAGE Take 1,000 mg by mouth 2 (two) times daily with a meal.   oxyCODONE 5 MG immediate release tablet Commonly known as: Oxy IR/ROXICODONE Take 1 tablet (5 mg total) by mouth every 4 (four) hours as needed for moderate pain (pain score 4-6) (pain score 4-6).   pantoprazole 40 MG tablet Commonly known as: PROTONIX Take 40 mg by mouth daily.   PARoxetine 10 MG tablet Commonly known as: PAXIL Take 10 mg by mouth daily.   simvastatin 20 MG tablet Commonly known as: ZOCOR Take 20 mg by mouth at bedtime.   traMADol 50 MG tablet Commonly known as: ULTRAM Take 1-2 tablets (50-100 mg total) by mouth every 4 (four) hours as needed for moderate pain (pain score 4-6).   Trulicity 0.75 MG/0.5ML Soaj Generic drug: Dulaglutide Inject 0.75 mg into the skin every Sunday.               Durable Medical Equipment  (From admission, onward)           Start     Ordered   04/30/23 1718  DME Walker rolling  Once       Question:  Patient needs a walker to treat with the following condition  Answer:  Total knee replacement status   04/30/23 1717   04/30/23 1718  DME Bedside commode  Once       Comments: Patient is not able to walk the distance required to go the bathroom, or he/she is unable to safely negotiate stairs required to access the bathroom.  A 3in1 BSC will alleviate this problem  Question:  Patient needs a bedside commode to treat with the following condition  Answer:  Total knee replacement status   04/30/23 1717            Follow-up Information     Rayburn Go, PA-C Follow up on 05/15/2023.   Specialty: Orthopedic Surgery Why: at 9:45am Contact information: [address] [place] [address] [phone]         Donato Heinz, MD Follow up on 06/12/2023.   Specialty: Orthopedic Surgery Why: at 3:00pm Contact  information: [address] [facility] [place] [address] [phone]                 Signed: Gean Birchwood 05/01/2023, 8:34 AM   Objective: Vital signs in last 24 hours: Temp:  [97.3 F (36.3 C)-98.8 F (37.1 C)] 98.2 F (36.8 C) (03/11 0753) Pulse Rate:  [74-95] 87 (03/11 0753) Resp:  [10-20] 16 (03/11 0753) BP: (106-154)/(61-95) 154/79 (03/11 0753) SpO2:  [89 %-100 %] 97 % (03/11 0753) Weight:  [93 kg] 93 kg (03/10 1049)  Intake/Output from previous day:  Intake/Output Summary (Last 24 hours) at 05/01/2023 0834 Last data filed at 05/01/2023 0500 Gross per 24 hour  Intake 1776.43 ml  Output 410 ml  Net 1366.43 ml    Intake/Output this shift: No intake/output data recorded.  Labs: No results for input(s): "HGB" in  the last 72 hours. No results for input(s): "WBC", "RBC", "HCT", "PLT" in the last 72 hours. No results for input(s): "NA", "K", "CL", "CO2", "BUN", "CREATININE", "GLUCOSE", "CALCIUM" in the last 72 hours. No results for input(s): "LABPT", "INR" in the last 72 hours.  EXAM: General - Patient is Alert, Appropriate, and Oriented Extremity - Neurologically intact Neurovascular intact Sensation intact distally Intact pulses distally Dorsiflexion/Plantar flexion intact No cellulitis present Compartment soft Dressing - dressing C/D/I and no drainage Motor Function - intact, moving foot and toes well on exam. JP Drain pulled without difficulty. Intact  Assessment/Plan: 1 Day Post-Op Procedure(s) (LRB): ARTHROPLASTY, KNEE, TOTAL, USING IMAGELESS COMPUTER-ASSISTED NAVIGATION (Right) Procedure(s) (LRB): ARTHROPLASTY, KNEE, TOTAL, USING IMAGELESS COMPUTER-ASSISTED NAVIGATION (Right) Past Medical History:  Diagnosis Date   Actinic keratosis    Arthritis    Diabetes mellitus without complication (HCC)    GERD (gastroesophageal reflux disease)    Hard of hearing    left hearing aid   HLD (hyperlipidemia)    HTN (hypertension)     Hypotension, postural    IBS (irritable bowel syndrome)    Primary osteoarthritis of right knee    Sleep apnea    Squamous cell carcinoma of skin 01/20/2010   Right chest. Well differentiated. Excised: 01/27/2010   Squamous cell carcinoma of skin 07/15/2019   R preauricular ant to lobe   Stroke [facility])    May 31/ 2023   Wears dentures    partial upper, implants on bottom   Principal Problem:   History of total knee arthroplasty, right  Estimated body mass index is 24.95 kg/m as calculated from the following:   Height as of this encounter: 6\' 4"  (1.93 m).   Weight as of this encounter: 93 kg.  Patient will continue to work with physical therapy on gait, ROM and strengthening   Discussed with the patient continuing to utilize Polar Care   Patient will use bone foam in 20-30 minute intervals   Patient will wear TED hose bilaterally to help prevent DVT and clot formation   Discussed the Aquacel bandage.  This bandage will stay in place 7 days postoperatively.  Can be replaced with honeycomb bandages that will be sent home with the patient   Discussed sending the patient home with tramadol and oxycodone for as needed pain management.  Patient will also be sent home with Celebrex to help with swelling and inflammation.  Patient will take at home plavix and ASA BID for DVT prophylaxis   JP drain removed without difficulty, intact   Weight-Bearing as tolerated to right leg   Patient will follow-up with [facility] clinic orthopedics in 2 weeks for staple removal and reevaluation  Diet - Regular diet Follow up - in 2 weeks Activity - WBAT Disposition - Home Condition Upon Discharge - Good DVT Prophylaxis - None and TED hose  Danise Edge, PA-C Orthopaedic Surgery 05/01/2023, 8:34 AM

## 2023-04-30 NOTE — Transfer of Care (Signed)
 Immediate Anesthesia Transfer of Care Note  Patient: Vernon Payne.  Procedure(s) Performed: ARTHROPLASTY, KNEE, TOTAL, USING IMAGELESS COMPUTER-ASSISTED NAVIGATION (Right: Knee)  Patient Location: PACU  Anesthesia Type:General  Level of Consciousness: drowsy  Airway & Oxygen Therapy: Patient Spontanous Breathing and Patient connected to face mask oxygen  Post-op Assessment: Report given to RN and Post -op Vital signs reviewed and stable  Post vital signs: Reviewed and stable  Last Vitals:  Vitals Value Taken Time  BP    Temp    Pulse 86 04/30/23 1457  Resp 15 04/30/23 1457  SpO2 99 % 04/30/23 1457  Vitals shown include unfiled device data.  Last Pain:  Vitals:   04/30/23 1049  TempSrc: Temporal  PainSc: 0-No pain         Complications: There were no known notable events for this encounter.

## 2023-04-30 NOTE — Anesthesia Procedure Notes (Signed)
 Procedure Name: Intubation Date/Time: 04/30/2023 11:16 AM  Performed by: Malva Cogan, CRNAPre-anesthesia Checklist: Patient identified, Patient being monitored, Timeout performed, Emergency Drugs available and Suction available Patient Re-evaluated:Patient Re-evaluated prior to induction Oxygen Delivery Method: Circle system utilized Preoxygenation: Pre-oxygenation with 100% oxygen Induction Type: IV induction Ventilation: Mask ventilation without difficulty and Oral airway inserted - appropriate to patient size Laryngoscope Size: McGrath and 4 Grade View: Grade I Tube type: Oral Tube size: 7.0 mm Number of attempts: 1 Airway Equipment and Method: Stylet Placement Confirmation: ETT inserted through vocal cords under direct vision, positive ETCO2 and breath sounds checked- equal and bilateral Secured at: 23 cm Tube secured with: Tape Dental Injury: Teeth and Oropharynx as per pre-operative assessment

## 2023-04-30 NOTE — Plan of Care (Signed)
  Problem: Skin Integrity: Goal: Risk for impaired skin integrity will decrease Outcome: Progressing   Problem: Activity: Goal: Ability to avoid complications of mobility impairment will improve Outcome: Progressing Goal: Range of joint motion will improve Outcome: Progressing   Problem: Pain Management: Goal: Pain level will decrease with appropriate interventions Outcome: Progressing   Problem: Skin Integrity: Goal: Will show signs of wound healing Outcome: Progressing

## 2023-04-30 NOTE — Progress Notes (Signed)
 Patient is not able to walk the distance required to go the bathroom, or he/she is unable to safely negotiate stairs required to access the bathroom.  A 3in1 BSC will alleviate this problem   Amenda Duclos P. Angie Fava M.D.

## 2023-04-30 NOTE — Progress Notes (Signed)
 Subjective: 1 Day Post-Op Procedure(s) (LRB): ARTHROPLASTY, KNEE, TOTAL, USING IMAGELESS COMPUTER-ASSISTED NAVIGATION (Right) Patient reports pain as mild.   Patient seen in rounds with Dr. Ernest Pine. Patient is well, and has had no acute complaints or problems. Denies any CP, SOB, N/V, fevers or chills We will start therapy today.  Plan is to go Home after hospital stay.  Objective: Vital signs in last 24 hours: Temp:  [97.3 F (36.3 C)-98.8 F (37.1 C)] 98.2 F (36.8 C) (03/11 0753) Pulse Rate:  [74-95] 87 (03/11 0753) Resp:  [10-20] 16 (03/11 0753) BP: (106-154)/(61-95) 154/79 (03/11 0753) SpO2:  [89 %-100 %] 97 % (03/11 0753) Weight:  [93 kg] 93 kg (03/10 1049)  Intake/Output from previous day:  Intake/Output Summary (Last 24 hours) at 05/01/2023 0834 Last data filed at 05/01/2023 0500 Gross per 24 hour  Intake 1776.43 ml  Output 410 ml  Net 1366.43 ml    Intake/Output this shift: No intake/output data recorded.  Labs: No results for input(s): "HGB" in the last 72 hours. No results for input(s): "WBC", "RBC", "HCT", "PLT" in the last 72 hours. No results for input(s): "NA", "K", "CL", "CO2", "BUN", "CREATININE", "GLUCOSE", "CALCIUM" in the last 72 hours. No results for input(s): "LABPT", "INR" in the last 72 hours.  EXAM General - Patient is Alert, Appropriate, and Oriented Extremity - Neurologically intact Neurovascular intact Sensation intact distally Intact pulses distally Dorsiflexion/Plantar flexion intact No cellulitis present Compartment soft Dressing - dressing C/D/I and no drainage Motor Function - intact, moving foot and toes well on exam. JP Drain pulled without difficulty. Intact  Past Medical History:  Diagnosis Date   Actinic keratosis    Arthritis    Diabetes mellitus without complication (HCC)    GERD (gastroesophageal reflux disease)    Hard of hearing    left hearing aid   HLD (hyperlipidemia)    HTN (hypertension)    Hypotension,  postural    IBS (irritable bowel syndrome)    Primary osteoarthritis of right knee    Sleep apnea    Squamous cell carcinoma of skin 01/20/2010   Right chest. Well differentiated. Excised: 01/27/2010   Squamous cell carcinoma of skin 07/15/2019   R preauricular ant to lobe   Stroke [facility])    May 31/ 2023   Wears dentures    partial upper, implants on bottom    Assessment/Plan: 1 Day Post-Op Procedure(s) (LRB): ARTHROPLASTY, KNEE, TOTAL, USING IMAGELESS COMPUTER-ASSISTED NAVIGATION (Right) Principal Problem:   History of total knee arthroplasty, right  Estimated body mass index is 24.95 kg/m as calculated from the following:   Height as of this encounter: 6\' 4"  (1.93 m).   Weight as of this encounter: 93 kg. Advance diet Up with therapy  Patient will continue to work with physical therapy to pass postoperative PT protocols, ROM and strengthening  Discussed with the patient continuing to utilize Polar Care  Patient will use bone foam in 20-30 minute intervals  Patient will wear TED hose bilaterally to help prevent DVT and clot formation  Discussed the Aquacel bandage.  This bandage will stay in place 7 days postoperatively.  Can be replaced with honeycomb bandages that will be sent home with the patient  Discussed sending the patient home with tramadol and oxycodone for as needed pain management.  Patient will also be sent home with Celebrex to help with swelling and inflammation.  Patient will take at home plavix and ASA BID for DVT prophylaxis  JP drain removed without difficulty, intact  Weight-Bearing as tolerated to right leg  Patient will follow-up with [facility] clinic orthopedics in 2 weeks for staple removal and reevaluation  Rayburn Go, PA-C [facility] 05/01/2023, 8:34 AM

## 2023-05-01 ENCOUNTER — Encounter: Payer: Self-pay | Admitting: Orthopedic Surgery

## 2023-05-01 DIAGNOSIS — M1711 Unilateral primary osteoarthritis, right knee: Secondary | ICD-10-CM | POA: Diagnosis not present

## 2023-05-01 LAB — GLUCOSE, CAPILLARY
Glucose-Capillary: 353 mg/dL — ABNORMAL HIGH (ref 70–99)
Glucose-Capillary: 167 mg/dL — ABNORMAL HIGH (ref 70–99)
Glucose-Capillary: 221 mg/dL — ABNORMAL HIGH (ref 70–99)

## 2023-05-01 MED ORDER — METFORMIN HCL 500 MG PO TABS
ORAL_TABLET | ORAL | Status: AC
  Filled 2023-05-01: qty 2

## 2023-05-01 MED ORDER — ASPIRIN 81 MG PO CHEW: 81.0000 mg | CHEWABLE_TABLET | Freq: Two times a day (BID) | ORAL | Status: DC

## 2023-05-01 MED ORDER — ACETAMINOPHEN 10 MG/ML IV SOLN
INTRAVENOUS | Status: AC
  Filled 2023-05-01: qty 100

## 2023-05-01 MED ORDER — CELECOXIB 200 MG PO CAPS: 200.0000 mg | ORAL_CAPSULE | Freq: Two times a day (BID) | ORAL | 1 refills | Status: DC

## 2023-05-01 MED ORDER — TRAMADOL HCL 50 MG PO TABS
50.0000 mg | ORAL_TABLET | ORAL | 0 refills | Status: DC | PRN
Start: 2023-05-01 — End: 2023-09-15

## 2023-05-01 MED ORDER — OXYCODONE HCL 5 MG PO TABS: 5.0000 mg | ORAL_TABLET | ORAL | 0 refills | Status: DC | PRN

## 2023-05-01 NOTE — Plan of Care (Signed)
 Patient progressing well towards discharge today

## 2023-05-01 NOTE — Progress Notes (Signed)
 DISCHARGE NOTE:  Pt given discharge instructions, scripts and 2 honeycomb dressings. Pt verbalized understanding. [facility] sent with pt. Pt wheeled to car by staff, wife providing transportation home.

## 2023-05-01 NOTE — Care Management Obs Status (Signed)
 MEDICARE OBSERVATION STATUS NOTIFICATION   Patient Details  Name: Vernon Payne. MRN: [id number] Date of Birth: 08-17-1950   Medicare Observation Status Notification Given:  Orland Dec, CMA 05/01/2023, 2:56 PM

## 2023-05-01 NOTE — Evaluation (Signed)
 Physical Therapy Evaluation Patient Details Name: Vernon Payne. MRN: [id number] DOB: Jul 20, 1950 Today's Date: 05/01/2023  History of Present Illness  Pt is a 73 y/o M admitted on 04/30/23 for R TKA. PMH: chicken pox, DM, glaucoma, HLD, HTN, OSA on CPAP, OA, ischemic stroke 2023  Clinical Impression  Pt seen for PT evaluation with pt agreeable to tx. Pt reports prior to admission he was independent without AD, driving, denies falls. PT provides pt with HEP handout & pt performed RLE strengthening, ROM exercises with cuing for technique & AAROM PRN. PT educates pt on use of bone foam, polar care, & need to promote R knee extension.  Pt is able to ambulate with RW & CGA with some R knee A/P instability with cuing to correct. Pt negotiates 4 steps with B rails & CGA. Will continue to follow pt acutely to address RLE strengthening & ROM, balance, gait with LRAD.      If plan is discharge home, recommend the following: A little help with walking and/or transfers;A little help with bathing/dressing/bathroom;Assistance with cooking/housework;Assist for transportation;Help with stairs or ramp for entrance   Can travel by private vehicle        Equipment Recommendations BSC/3in1  Recommendations for Other Services       Functional Status Assessment Patient has had a recent decline in their functional status and demonstrates the ability to make significant improvements in function in a reasonable and predictable amount of time.     Precautions / Restrictions Precautions Precautions: Fall Recall of Precautions/Restrictions: Intact Restrictions Weight Bearing Restrictions Per Provider Order: Yes RLE Weight Bearing Per Provider Order: Weight bearing as tolerated      Mobility  Bed Mobility Overal bed mobility: Needs Assistance Bed Mobility: Sidelying to Sit   Sidelying to sit: Supervision, HOB elevated, Used rails            Transfers Overall transfer level: Needs  assistance Equipment used: Rolling walker (2 wheels) Transfers: Sit to/from Stand Sit to Stand: Contact guard assist           General transfer comment: STS from EOB, education re: hand placement    Ambulation/Gait Ambulation/Gait assistance: Contact guard assist Gait Distance (Feet): 150 Feet Assistive device: Rolling walker (2 wheels) Gait Pattern/deviations: Decreased step length - right, Decreased stride length, Decreased step length - left Gait velocity: decreased     General Gait Details: Education re: need to push RW vs picking it up with slow improvement as gait progressed. Pt with slight R knee A/P instability with PT providing cuing re: focusing on quad activation/knee extension in RLE stance phase & use BUE on RW to offload weight bearing on RLE PRN with good improvement. Cuing for forward vs downward gaze & decreased shoulder elevation.  Stairs Stairs: Yes Stairs assistance: Contact guard assist Stair Management: Two rails, Step to pattern Number of Stairs: 4 (6") General stair comments: Pt negotiates 4 steps with B rails & CGA after PT provides education/demo re: compensatory pattern with pt requiring cuing 1x during task.  Wheelchair Mobility     Tilt Bed    Modified Rankin (Stroke Patients Only)       Balance Overall balance assessment: Needs assistance Sitting-balance support: Feet supported Sitting balance-Leahy Scale: Good     Standing balance support: During functional activity, Bilateral upper extremity supported, Reliant on assistive device for balance Standing balance-Leahy Scale: Fair  Pertinent Vitals/Pain Pain Assessment Pain Assessment: 0-10 Pain Score: 3  Pain Location: R knee with mobility Pain Descriptors / Indicators: Discomfort, Grimacing, Guarding Pain Intervention(s): Monitored during session, Limited activity within patient's tolerance    Home Living Family/patient expects to be  discharged to:: Private residence Living Arrangements: Spouse/significant other Available Help at Discharge: Family;Available PRN/intermittently (wife works during the day) Type of Home: House Home Access: Stairs to enter;Ramped entrance Entrance Stairs-Rails: None Entrance Stairs-Number of Steps: 6   Home Layout: Two level;Able to live on main level with bedroom/bathroom Home Equipment: Rolling Walker (2 wheels);Cane - single point      Prior Function               Mobility Comments: Ambulatory without AD but will "wall walk" on occasion, denies falls, still driving.       Extremity/Trunk Assessment   Upper Extremity Assessment Upper Extremity Assessment: Overall WFL for tasks assessed    Lower Extremity Assessment Lower Extremity Assessment: RLE deficits/detail RLE Deficits / Details: 3-/5 knee extension in sitting       Communication   Communication Communication: Impaired Factors Affecting Communication: Hearing impaired    Cognition Arousal: Alert Behavior During Therapy: WFL for tasks assessed/performed   PT - Cognitive impairments: No apparent impairments                         Following commands: Intact       Cueing       General Comments      Exercises Total Joint Exercises Ankle Circles/Pumps: Supine, AROM, Right, 10 reps Quad Sets: AROM, Supine, Strengthening, Right, 10 reps Short Arc Quad: AROM, Supine, Strengthening, Right, 10 reps Heel Slides: AAROM, Supine, Strengthening, Right, 10 reps Hip ABduction/ADduction: Supine, AAROM, Strengthening, Right, 10 reps Straight Leg Raises: AROM, Supine, AAROM, Right, 10 reps, Strengthening (2 reps AAROM, 8 reps AROM) Long Arc Quad: AROM, Seated, Strengthening, Right, 10 reps Knee Flexion: AROM, Strengthening, Seated, Right, 10 reps   Assessment/Plan    PT Assessment Patient needs continued PT services  PT Problem List Decreased strength;Pain;Decreased range of motion;Decreased activity  tolerance;Decreased balance;Decreased mobility;Decreased safety awareness;Decreased knowledge of use of DME       PT Treatment Interventions DME instruction;Balance training;Gait training;Neuromuscular re-education;Stair training;Functional mobility training;Therapeutic activities;Therapeutic exercise;Patient/family education;Manual techniques;Modalities    PT Goals (Current goals can be found in the Care Plan section)  Acute Rehab PT Goals Patient Stated Goal: get better PT Goal Formulation: With patient Time For Goal Achievement: 05/15/23 Potential to Achieve Goals: Good    Frequency BID     Co-evaluation               AM-PAC PT "6 Clicks" Mobility  Outcome Measure Help needed turning from your back to your side while in a flat bed without using bedrails?: None Help needed moving from lying on your back to sitting on the side of a flat bed without using bedrails?: A Little Help needed moving to and from a bed to a chair (including a wheelchair)?: A Little Help needed standing up from a chair using your arms (e.g., wheelchair or bedside chair)?: A Little Help needed to walk in hospital room?: A Little Help needed climbing 3-5 steps with a railing? : A Little 6 Click Score: 19    End of Session Equipment Utilized During Treatment: Gait belt Activity Tolerance: Patient tolerated treatment well Patient left:  (in handoff to OT) Nurse Communication: Mobility status PT Visit Diagnosis: Pain;Other  abnormalities of gait and mobility (R26.89);Muscle weakness (generalized) (M62.81) Pain - Right/Left: Right Pain - part of body: Knee    Time: 0928-1000 PT Time Calculation (min) (ACUTE ONLY): 32 min   Charges:   PT Evaluation $PT Eval Low Complexity: 1 Low PT Treatments $Therapeutic Exercise: 8-22 mins PT General Charges $$ ACUTE PT VISIT: 1 Visit         Aleda Grana, PT, DPT 05/01/23, 10:18 AM   Sandi Mariscal 05/01/2023, 10:16 AM

## 2023-05-01 NOTE — Progress Notes (Signed)
 Occupational Therapy Evaluation Patient Details Name: Vernon Payne. MRN: [id number] DOB: October 10, 1950 Today's Date: 05/01/2023   History of Present Illness   Pt is a 73 y/o M admitted on 04/30/23 for R TKA. PMH: chicken pox, DM, glaucoma, HLD, HTN, OSA on CPAP, OA, ischemic stroke 2023     Clinical Impressions Pt seen for OT evaluation this date, POD#1 from above surgery. Pt was independent in all ADLs prior to surgery. No DME use PTA however pt reports occasionally "wall walking," denies falls recent falls and still driving.Pt is eager to return to PLOF with less pain and improved safety and independence. Pt completed sink level grooming task with CGA as precaution, MIN verbal cues for RW managment. MINA required for donning R socks while sitting in recliner, CGA for donning pants and shoes sit/stand due to pain and limited AROM of R knee. Pt instructed in polar care mgt, falls prevention strategies, home/routines modifications, DME/AE for LB bathing and dressing tasks, and compression stocking mgt. Pt had no further questions. Pt operative site d/c/I pre/post session. Pt would benefit from skilled OT services including additional instruction in dressing techniques with or without assistive devices for dressing and bathing skills to support recall and carryover prior to discharge and ultimately to maximize safety, independence, and minimize falls risk and caregiver burden. OT will follow acutely.     If plan is discharge home, recommend the following:   A little help with walking and/or transfers;A little help with bathing/dressing/bathroom;Assist for transportation;Help with stairs or ramp for entrance;Assistance with cooking/housework     Functional Status Assessment   Patient has had a recent decline in their functional status and demonstrates the ability to make significant improvements in function in a reasonable and predictable amount of time.     Equipment Recommendations    None recommended by OT     Recommendations for Other Services         Precautions/Restrictions   Precautions Precautions: Fall Recall of Precautions/Restrictions: Intact Restrictions Weight Bearing Restrictions Per Provider Order: Yes RLE Weight Bearing Per Provider Order: Weight bearing as tolerated     Mobility Bed Mobility Overal bed mobility:  (NT pt handed off to OT in hallway, pt retired in Public relations account executive session)                  Transfers Overall transfer level: Needs assistance Equipment used: Rolling walker (2 wheels) Transfers: Sit to/from Stand Sit to Stand: Contact guard assist                  Balance Overall balance assessment: Needs assistance Sitting-balance support: Feet supported Sitting balance-Leahy Scale: Good     Standing balance support: During functional activity, Bilateral upper extremity supported, Reliant on assistive device for balance Standing balance-Leahy Scale: Fair                             ADL either performed or assessed with clinical judgement   ADL Overall ADL's : Needs assistance/impaired Eating/Feeding: Independent   Grooming: Wash/dry hands;Wash/dry face;Brushing hair;Applying deodorant;Standing;Contact guard assist           Upper Body Dressing : Sitting;Independent   Lower Body Dressing: Contact guard assist;Minimal assistance;Sit to/from stand [facility] for donning R sock only)   Toilet Transfer: Ambulation;Cueing for safety;Contact guard assist;Rolling walker (2 wheels)   Toileting- Clothing Manipulation and Hygiene: Modified independent       Functional mobility during ADLs: Rolling walker (  2 wheels);Contact guard assist General ADL Comments: Pt completed sink level grooming task with CGA as precaution, MIN verbal cues for RW managment. MINA required for donning R socks while sitting in recliner, CGA for donning pants and shoes sit/stand.      Pertinent Vitals/Pain Pain  Assessment Pain Assessment: 0-10 Pain Score: 3  Pain Location: R knee with mobility Pain Descriptors / Indicators: Discomfort, Grimacing, Guarding Pain Intervention(s): Limited activity within patient's tolerance, Repositioned, Monitored during session     Extremity/Trunk Assessment Upper Extremity Assessment Upper Extremity Assessment: Overall WFL for tasks assessed   Lower Extremity Assessment Lower Extremity Assessment: Defer to PT evaluation RLE Deficits / Details: 3-/5 knee extension in sitting       Communication Communication Communication: Impaired Factors Affecting Communication: Hearing impaired   Cognition Arousal: Alert Behavior During Therapy: WFL for tasks assessed/performed Cognition: No apparent impairments             OT - Cognition Comments: A/O x4                 Following commands: Intact       Cueing  General Comments   Cueing Techniques: Verbal cues  Post op dressing d/c/I pre/post session   Exercises Exercises: Other exercises Total Joint Exercises Ankle Circles/Pumps: Other (comment) Other Exercises Other Exercises: Edu: Role of OT, handout provided, reviewed with pt (AD for ADL use at d/c, compression sock mang., safe ADL completion, LB dressing techniques)   Shoulder Instructions      Home Living Family/patient expects to be discharged to:: Private residence Living Arrangements: Spouse/significant other Available Help at Discharge: Family;Available PRN/intermittently Type of Home: House Home Access: Stairs to enter;Ramped entrance Entrance Stairs-Number of Steps: 6 Entrance Stairs-Rails: None Home Layout: Two level;Able to live on main level with bedroom/bathroom     Bathroom Shower/Tub: Walk-in shower         Home Equipment: Agricultural consultant (2 wheels);Cane - single point          Prior Functioning/Environment Prior Level of Function : Independent/Modified Independent;Driving             Mobility  Comments: Ambulatory without AD but will "wall walk" on occasion, denies falls, still driving. ADLs Comments: Indep IADL/ADL    OT Problem List: Decreased range of motion;Decreased coordination;Decreased knowledge of use of DME or AE   OT Treatment/Interventions: Self-care/ADL training;DME and/or AE instruction;Therapeutic activities      OT Goals(Current goals can be found in the care plan section)   Acute Rehab OT Goals Patient Stated Goal: to return home OT Goal Formulation: With patient Time For Goal Achievement: 05/15/23 Potential to Achieve Goals: Good   OT Frequency:  Min 1X/week    AM-PAC OT "6 Clicks" Daily Activity     Outcome Measure Help from another person eating meals?: None Help from another person taking care of personal grooming?: A Little Help from another person toileting, which includes using toliet, bedpan, or urinal?: None Help from another person bathing (including washing, rinsing, drying)?: A Little Help from another person to put on and taking off regular upper body clothing?: None Help from another person to put on and taking off regular lower body clothing?: A Little 6 Click Score: 21   End of Session Equipment Utilized During Treatment: Gait belt;Rolling walker (2 wheels) Nurse Communication: Mobility status  Activity Tolerance: Patient tolerated treatment well Patient left: in chair;with call bell/phone within reach  OT Visit Diagnosis: Unsteadiness on feet (R26.81);Other abnormalities of gait and mobility (  R26.89);Muscle weakness (generalized) (M62.81)                Time: 5621-3086 OT Time Calculation (min): 22 min Charges:  OT General Charges $OT Visit: 1 Visit OT Evaluation $OT Eval Low Complexity: 1 Low  Glenard Haring M.S. OTR/L  05/01/23, 11:17 AM

## 2023-05-01 NOTE — Anesthesia Postprocedure Evaluation (Signed)
 Anesthesia Post Note  Patient: Salvadore Valvano.  Procedure(s) Performed: ARTHROPLASTY, KNEE, TOTAL, USING IMAGELESS COMPUTER-ASSISTED NAVIGATION (Right: Knee)  Patient location during evaluation: Nursing Unit Anesthesia Type: Spinal Level of consciousness: awake and alert and oriented Pain management: pain level controlled Vital Signs Assessment: post-procedure vital signs reviewed and stable Respiratory status: respiratory function stable Cardiovascular status: stable Postop Assessment: no headache, no backache, patient able to bend at knees, adequate PO intake, able to ambulate and no apparent nausea or vomiting Anesthetic complications: no   There were no known notable events for this encounter.   Last Vitals:  Vitals:   05/01/23 0431 05/01/23 0753  BP: (!) 149/88 (!) 154/79  Pulse: 79 87  Resp: 18 16  Temp: 36.4 C 36.8 C  SpO2: 97% 97%    Last Pain:  Vitals:   05/01/23 0753  TempSrc: Temporal  PainSc:                  Zachary George

## 2023-05-01 NOTE — TOC Progression Note (Signed)
 Transition of Care (TOC) - Progression Note    Patient Details  Name: Vernon Payne. MRN: [id number] Date of Birth: 11-07-1950  Transition of [facility] CM/SW Contact  Marlowe Sax, RN Phone Number: 05/01/2023, 9:55 AM  Clinical Narrative:     [facility] set up prior to surgery by surgeons office for [facility], 3 in 1 to be delivered to the bedside by [employer]       Expected Discharge Plan and Services         Expected Discharge Date: 05/01/23                                     Social Determinants of Health (SDOH) Interventions SDOH Screenings   Food Insecurity: No Food Insecurity (04/30/2023)  Housing: Low Risk  (04/30/2023)  Transportation Needs: No Transportation Needs (04/30/2023)  Utilities: Not At Risk (04/30/2023)  Depression (PHQ2-9): Low Risk  (12/16/2018)  Financial Resource Strain: Low Risk  (01/23/2023)   Received from [facility]  Social Connections: Socially Isolated (04/30/2023)  Tobacco Use: Low Risk  (04/30/2023)    Readmission Risk Interventions     No data to display

## 2023-06-14 ENCOUNTER — Telehealth: Payer: Self-pay

## 2023-06-14 NOTE — Telephone Encounter (Signed)
 I called patient to see if he could come in today to be evaluated before we close, but he didn't answer and the voicemail box was full.

## 2023-06-14 NOTE — Telephone Encounter (Signed)
 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 Pt states that testicles are inflamed, purple appearing, and very painful when he moves around. He spoke with the pharmacist who said it sounds like he is having an allergic reaction, and they recommend prescribing topical lidocaine  for the pain. Pt states that he applied medication to condylomas and that medication transferred to his testicles while moving around in his sleep. Please advise.

## 2023-06-14 NOTE — Telephone Encounter (Signed)
 I called patient again to see if he could come into office today before we close. No answer and voicemail box full.

## 2023-06-15 ENCOUNTER — Telehealth: Payer: Self-pay | Admitting: Dermatology

## 2023-06-15 DIAGNOSIS — R21 Rash and other nonspecific skin eruption: Secondary | ICD-10-CM

## 2023-06-15 MED ORDER — CLOBETASOL PROPIONATE 0.05 % EX OINT
1.0000 | TOPICAL_OINTMENT | Freq: Two times a day (BID) | CUTANEOUS | 0 refills | Status: AC
Start: 2023-06-15 — End: ?

## 2023-06-15 MED ORDER — URIDINE TRIACETATE 10 G PO PACK: 10.0000 g | PACK | Freq: Four times a day (QID) | ORAL | 0 refills | Status: AC

## 2023-06-15 MED ORDER — DOXYCYCLINE HYCLATE 100 MG PO TABS
100.0000 mg | ORAL_TABLET | Freq: Two times a day (BID) | ORAL | 0 refills | Status: AC
Start: 2023-06-15 — End: 2023-06-25

## 2023-06-15 NOTE — Telephone Encounter (Signed)
 Spoke to patient by phone. Patient decided to try the clobetasol and doxycycline first before going to the ED. Has seen some improvement with treatment so far.

## 2023-06-15 NOTE — Telephone Encounter (Signed)
 Also discussed fournier gangrene can be fatal if untreated

## 2023-06-15 NOTE — Telephone Encounter (Signed)
 Spoke to wife. Wife described the blistering and drainage noted. I shared concern about fournier gangrene and need for urgent ED evaluation

## 2023-06-15 NOTE — Telephone Encounter (Signed)
 Called [phone] number and spoke to wife who recommended calling [phone] number  Called [phone] and spoke to patient. Scrotum is inflamed, seems infected infected, has drainage, painful. Developed after applying 5FU once or twice last week to lesions on scrotum and penis. Hard to move or drive. Discharge on bed sheets. Recommended that patient stop 5FU (patient already stopped after reaction developed) Recommended: If worsening or develops systemic symptoms or not improving, go to ED. Patient lost phone and could not respond to [facility] yesterday Will schedule visit Monday  Start clobetasol BID, doxy 100 mg BID x 10 days, uridine triacetate 10 g q6h x 20.  Called [facility] pharmacy, they have clobetasol doxy but not uridine triacetate  Called patient again. Asked for more detail. Patient had blisters that burst and had drainage. slightly got better with hydrocortisone cream. Still has scaling. Changed my recommendation to immediate evaluation at the ED for fournier gangrene given hx of diabetes. Typical evaluation might include physical exam by a surgeon, CT scan to check for deep inflammation/gas in deep tissues/ surgical debridement and antibiotics. If none are found then patient may be sent home and could try the clobetasol/doxycycline that I sent to [facility]. Patient will call wife to update her. Patient stated twice that patient is able to drive self to nearest ED ([facility])

## 2023-06-16 ENCOUNTER — Telehealth: Payer: Self-pay | Admitting: Dermatology

## 2023-06-16 NOTE — Telephone Encounter (Signed)
 Spoke to patient. Feels better with clobetasol. Redness gone. Blisters better. Less pain. Warts are still persistent; patient asked whether to use a smaller amount of topical 5FU. Recommended NOT using any 5FU given the severity of the reaction. Patient will call when desiring follow up for warts and may consider cryotherapy.

## 2023-06-21 ENCOUNTER — Ambulatory Visit: Admitting: Dermatology

## 2023-06-21 DIAGNOSIS — A630 Anogenital (venereal) warts: Secondary | ICD-10-CM | POA: Diagnosis not present

## 2023-06-21 DIAGNOSIS — L2489 Irritant contact dermatitis due to other agents: Secondary | ICD-10-CM

## 2023-06-21 DIAGNOSIS — R21 Rash and other nonspecific skin eruption: Secondary | ICD-10-CM

## 2023-06-21 NOTE — Patient Instructions (Signed)

## 2023-06-21 NOTE — Progress Notes (Signed)
   Follow-Up Visit   Subjective  Vernon Payne. is a 73 y.o. adult who presents for the following: patient here for condyloma f/u, patient states when he is at home he only wears bath robes due to underwear rubbing and being painful. Patient's wife applies clobetasol  ointment, but reports testicles still inflamed and some blisters popped the other night, patient also taking Doxycyline 100mg  BID. Patient denies any side effects from Doxycycline .   The patient has spots, moles and lesions to be evaluated, some may be new or changing and the patient may have concern these could be cancer.   The following portions of the chart were reviewed this encounter and updated as appropriate: medications, allergies, medical history  Review of Systems:  No other skin or systemic complaints except as noted in HPI or Assessment and Plan.  Objective  Well appearing patient in no apparent distress; mood and affect are within normal limits.  A focused examination was performed of the following areas: Pubic  Relevant exam findings are noted in the Assessment and Plan.  Pubic Skin-coloured cerebriform papules on penile shaft  Assessment & Plan     CONDYLOMA Pubic Discussed option of cryotherapy. Jointly decided to wait until irritation clears. Recommended that patient NOT retry 5FU given severity of reaction RASH AND OTHER NONSPECIFIC SKIN ERUPTION   Related Procedures HSV and VZV PCR Panel Related Medications clobetasol  ointment (TEMOVATE ) 0.05 % Apply 1 Application topically 2 (two) times daily. Apply to affected area until inflammation resolves doxycycline  (VIBRA -TABS) 100 MG tablet Take 1 tablet (100 mg total) by mouth 2 (two) times daily for 10 days. Take with food. Avoid taking with dairy. Protect skin from sun if outside during the day IRRITANT CONTACT DERMATITIS DUE TO OTHER AGENTS  Severe irritant contact dermatitis due to sensitivity to 5FU  Exam: angular reticulated pink  erosions on scrotum, some with re-epithelialization. No blisters on exam  Plan: Swabbed to rule out HSV Continue clobetasol  BID until healed, use up to 2 weeks  Return in about 2 weeks (around 07/05/2023).  I, Jacquelynn Vera, CMA, am acting as scribe for Harris Liming, MD .   Documentation: I have reviewed the above documentation for accuracy and completeness, and I agree with the above.  Harris Liming, MD

## 2023-06-22 ENCOUNTER — Encounter: Payer: Self-pay | Admitting: Dermatology

## 2023-06-25 LAB — HSV AND VZV PCR PANEL
HSV 2 DNA: NEGATIVE
HSV-1 DNA: NEGATIVE
Varicella-Zoster, PCR: NEGATIVE

## 2023-06-26 ENCOUNTER — Telehealth: Payer: Self-pay

## 2023-06-26 NOTE — Telephone Encounter (Signed)
 Discussed culture results. Area is improving, has smaller area to treat. Thinks may need to use the Clobetasol  2-3 more days. Will keep appointment for follow up 07/05/2023

## 2023-06-26 NOTE — Telephone Encounter (Signed)
-----   Message from [place] sent at 06/25/2023  9:46 PM EDT ----- Please call to share that swab from scrotum that tested for three viruses (VZV HSV1 HSV2) was negative meaning no viruses were detected. Please get update on scrotum irritation. if it has resolved then patient should stop applying clobetasol . Thank you

## 2023-07-05 ENCOUNTER — Ambulatory Visit: Admitting: Dermatology

## 2023-07-21 ENCOUNTER — Emergency Department

## 2023-07-21 ENCOUNTER — Emergency Department
Admission: EM | Admit: 2023-07-21 | Discharge: 2023-07-21 | Disposition: A | Discharge status: Home / Self Care | Attending: Emergency Medicine | Admitting: Emergency Medicine

## 2023-07-21 ENCOUNTER — Other Ambulatory Visit: Payer: Self-pay

## 2023-07-21 DIAGNOSIS — Z8673 Personal history of transient ischemic attack (TIA), and cerebral infarction without residual deficits: Secondary | ICD-10-CM | POA: Insufficient documentation

## 2023-07-21 DIAGNOSIS — W01198A Fall on same level from slipping, tripping and stumbling with subsequent striking against other object, initial encounter: Secondary | ICD-10-CM | POA: Insufficient documentation

## 2023-07-21 DIAGNOSIS — I10 Essential (primary) hypertension: Secondary | ICD-10-CM | POA: Insufficient documentation

## 2023-07-21 DIAGNOSIS — Y92009 Unspecified place in unspecified non-institutional (private) residence as the place of occurrence of the external cause: Secondary | ICD-10-CM

## 2023-07-21 DIAGNOSIS — Y93G1 Activity, food preparation and clean up: Secondary | ICD-10-CM | POA: Diagnosis not present

## 2023-07-21 DIAGNOSIS — E119 Type 2 diabetes mellitus without complications: Secondary | ICD-10-CM | POA: Diagnosis not present

## 2023-07-21 DIAGNOSIS — S0003XA Contusion of scalp, initial encounter: Secondary | ICD-10-CM | POA: Insufficient documentation

## 2023-07-21 DIAGNOSIS — M25461 Effusion, right knee: Secondary | ICD-10-CM | POA: Insufficient documentation

## 2023-07-21 DIAGNOSIS — Y92000 Kitchen of unspecified non-institutional (private) residence as  the place of occurrence of the external cause: Secondary | ICD-10-CM | POA: Diagnosis not present

## 2023-07-21 DIAGNOSIS — S0990XA Unspecified injury of head, initial encounter: Secondary | ICD-10-CM | POA: Diagnosis present

## 2023-07-21 DIAGNOSIS — Z96652 Presence of left artificial knee joint: Secondary | ICD-10-CM | POA: Diagnosis not present

## 2023-07-21 LAB — COMPREHENSIVE METABOLIC PANEL WITH GFR
ALT: 18 U/L (ref 0–44)
AST: 18 U/L (ref 15–41)
Albumin: 4.3 g/dL (ref 3.5–5.0)
Alkaline Phosphatase: 91 U/L (ref 38–126)
Anion gap: 11 (ref 5–15)
BUN: 32 mg/dL — ABNORMAL HIGH (ref 8–23)
CO2: 21 mmol/L — ABNORMAL LOW (ref 22–32)
Calcium: 9.7 mg/dL (ref 8.9–10.3)
Chloride: 102 mmol/L (ref 98–111)
Creatinine, Ser: 1.26 mg/dL — ABNORMAL HIGH (ref 0.61–1.24)
GFR, Estimated: 60 mL/min — ABNORMAL LOW (ref >60)
Glucose, Bld: 356 mg/dL — ABNORMAL HIGH (ref 70–99)
Potassium: 4.8 mmol/L (ref 3.5–5.1)
Sodium: 134 mmol/L — ABNORMAL LOW (ref 135–145)
Total Bilirubin: 1.2 mg/dL (ref 0.0–1.2)
Total Protein: 7.2 g/dL (ref 6.5–8.1)

## 2023-07-21 LAB — CBC WITH DIFFERENTIAL/PLATELET
Abs Immature Granulocytes: 0.1 10*3/uL — ABNORMAL HIGH (ref 0.00–0.07)
Basophils Absolute: 0.1 10*3/uL (ref 0.0–0.1)
Basophils Relative: 0 %
Eosinophils Absolute: 0.3 10*3/uL (ref 0.0–0.5)
Eosinophils Relative: 2 %
HCT: 38.5 % — ABNORMAL LOW (ref 39.0–52.0)
Hemoglobin: 12.7 g/dL — ABNORMAL LOW (ref 13.0–17.0)
Immature Granulocytes: 1 %
Lymphocytes Relative: 12 %
Lymphs Abs: 1.7 10*3/uL (ref 0.7–4.0)
MCH: 28.3 pg (ref 26.0–34.0)
MCHC: 33 g/dL (ref 30.0–36.0)
MCV: 85.9 fL (ref 80.0–100.0)
Monocytes Absolute: 1.1 10*3/uL — ABNORMAL HIGH (ref 0.1–1.0)
Monocytes Relative: 8 %
Neutro Abs: 10.9 10*3/uL — ABNORMAL HIGH (ref 1.7–7.7)
Neutrophils Relative %: 77 %
Platelets: 378 10*3/uL (ref 150–400)
RBC: 4.48 MIL/uL (ref 4.22–5.81)
RDW: 13.1 % (ref 11.5–15.5)
WBC: 14.1 10*3/uL — ABNORMAL HIGH (ref 4.0–10.5)
nRBC: 0 % (ref 0.0–0.2)

## 2023-07-21 LAB — PROTIME-INR
INR: 1 (ref 0.8–1.2)
Prothrombin Time: 13.6 s (ref 11.4–15.2)

## 2023-07-21 MED ORDER — OXYCODONE-ACETAMINOPHEN 5-325 MG PO TABS
2.0000 | ORAL_TABLET | Freq: Once | ORAL | Status: AC
  Administered 2023-07-21: 2 via ORAL
  Filled 2023-07-21: qty 2

## 2023-07-21 NOTE — ED Triage Notes (Signed)
 Pt comes in via pov after having a mechanical fall last night. When pt fell, he hit his head on an oven. Pt reports no LOC, and is on blood thinners. Pt complains of head pain, right knee pain, and back pain. Pt had back surgery in the area that he is complaining of pain. Pt is alert and oriented x4 with no signs of acute distress at this time.

## 2023-07-21 NOTE — ED Provider Notes (Signed)
 [facility] Provider Note    Event Date/Time   First MD Initiated Contact with Patient 07/21/23 1229     (approximate)   History   Chief Complaint: Fall   HPI  Vernon Payne. is a 73 y.o. adult with a history of GERD hypertension prior stroke diabetes who comes ED complaining of fall last night.  He was trying to wash his food in the kitchen sink when he lost his balance and fell over, hitting his head on the oven door and landing on his right knee.  He also complains of pain in the lower back particularly on the left.  Has some headache on the top of the head.  No neck pain.  No paresthesias or motor weakness.  He is on blood thinners.        Past Medical History:  Diagnosis Date   Actinic keratosis    Arthritis    Diabetes mellitus without complication (HCC)    GERD (gastroesophageal reflux disease)    Hard of hearing    left hearing aid   HLD (hyperlipidemia)    HTN (hypertension)    Hypotension, postural    IBS (irritable bowel syndrome)    Primary osteoarthritis of right knee    Sleep apnea    Squamous cell carcinoma of skin 01/20/2010   Right chest. Well differentiated. Excised: 01/27/2010   Squamous cell carcinoma of skin 07/15/2019   R preauricular ant to lobe   Stroke [facility])    May 31/ 2023   Wears dentures    partial upper, implants on bottom    Current Outpatient Rx   Order #: [id number] Class: No Print   Order #: [id number] Class: Historical Med   Order #: [id number] Class: Print   Order #: [id number] Class: Normal   Order #: [id number] Class: Normal   Order #: [id number] Class: Historical Med   Order #: [id number] Class: Historical Med   Order #: [id number] Class: Historical Med   Order #: [id number] Class: Historical Med   Order #: [id number] Class: Historical Med   Order #: [id number] Class: Historical Med   Order #: [id number] Class: Print   Order #: [id number] Class: Historical Med   Order #: [id number] Class: Historical Med   Order #:  [id number] Class: Historical Med   Order #: [id number] Class: Print   Order #: [id number] Class: Historical Med    Past Surgical History:  Procedure Laterality Date   APPENDECTOMY     BACK SURGERY     lumbar   CATARACT EXTRACTION W/PHACO Right 08/29/2017   Procedure: CATARACT EXTRACTION PHACO AND INTRAOCULAR LENS PLACEMENT (IOC) RIGHT DIABETIC;  Surgeon: Annell Kidney, MD;  Location: [facility];  Service: Ophthalmology;  Laterality: Right;  ISTENT INJECT   CATARACT EXTRACTION W/PHACO Left 11/21/2017   Procedure: CATARACT EXTRACTION PHACO AND INTRAOCULAR LENS PLACEMENT (IOC);  Surgeon: Annell Kidney, MD;  Location: [facility];  Service: Ophthalmology;  Laterality: Left;   COLONOSCOPY WITH PROPOFOL  N/A 01/26/2023   Procedure: COLONOSCOPY WITH PROPOFOL ;  Surgeon: Quintin Buckle, DO;  Location: [facility];  Service: Gastroenterology;  Laterality: N/A;   EYE SURGERY Left 11/21/2017   cataract excision   INSERTION OF ANTERIOR SEGMENT AQUEOUS DRAINAGE DEVICE (ISTENT) Right 08/29/2017   Procedure: (ISTENT);  Surgeon: Annell Kidney, MD;  Location: [facility];  Service: Ophthalmology;  Laterality: Right;  Diabetic - oral meds   INSERTION OF ANTERIOR SEGMENT AQUEOUS DRAINAGE DEVICE (ISTENT) Left 11/21/2017   Procedure: INSERTION OF ANTERIOR SEGMENT AQUEOUS DRAINAGE DEVICE (ISTENT)  INJECT DIABETES ISTENT INJECT;  Surgeon: Annell Kidney,  MD;  Location: [facility];  Service: Ophthalmology;  Laterality: Left;  Diabetic - oral meds   JOINT REPLACEMENT     left knee replacement   KNEE ARTHROPLASTY Right 04/30/2023   Procedure: ARTHROPLASTY, KNEE, TOTAL, USING IMAGELESS COMPUTER-ASSISTED NAVIGATION;  Surgeon: Arlyne Lame, MD;  Location: [facility] OR[facility];  Service: Orthopedics;  Laterality: Right;   KYPHOPLASTY N/A 03/28/2019   Procedure: L1 KYPHOPLASTY;  Surgeon: Molli Angelucci, MD;  Location: [facility];  Service: Orthopedics;  Laterality: N/A;    LASIK     POLYPECTOMY  01/26/2023   Procedure: POLYPECTOMY;  Surgeon: Quintin Buckle, DO;  Location: [facility] END[facility];  Service: Gastroenterology;;   ROTATOR CUFF REPAIR     TONSILLECTOMY      Physical Exam   Triage Vital Signs: ED Triage Vitals  Encounter Vitals Group     BP 07/21/23 1207 127/67     Systolic BP Percentile --      Diastolic BP Percentile --      Pulse Rate 07/21/23 1207 86     Resp 07/21/23 1207 20     Temp 07/21/23 1207 (!) 97.5 F (36.4 C)     Temp src --      SpO2 07/21/23 1207 100 %     Weight 07/21/23 1213 205 lb 0.4 oz (93 kg)     Height 07/21/23 1213 6\' 4"  (1.93 m)     Head Circumference --      Peak Flow --      Pain Score 07/21/23 1212 8     Pain Loc --      Pain Education --      Exclude from Growth Chart --     Most recent vital signs: Vitals:   07/21/23 1207 07/21/23 1450  BP: 127/67 (!) 157/88  Pulse: 86 82  Resp: 20 18  Temp: (!) 97.5 F (36.4 C)   SpO2: 100% 100%    General: Awake, no distress.  CV:  Good peripheral perfusion.  Regular rate and rhythm Resp:  Normal effort.  Clear to auscultation Abd:  No distention.  Soft nontender Other:  Head is atraumatic.  No C-spine tenderness.  There is lumbar tenderness in the area of L1.  There is also tenderness in the left lower back just superior to the posterior iliac wing.  Lower extremities are stable with intact range of motion and no deformity.  There is effusion at the right knee.   ED Results / Procedures / Treatments   Labs (all labs ordered are listed, but only abnormal results are displayed) Labs Reviewed  COMPREHENSIVE METABOLIC PANEL WITH GFR - Abnormal; Notable for the following components:      Result Value   Sodium 134 (*)    CO2 21 (*)    Glucose, Bld 356 (*)    BUN 32 (*)    Creatinine, Ser 1.26 (*)    GFR, Estimated 60 (*)    All other components within normal limits  CBC WITH DIFFERENTIAL/PLATELET - Abnormal; Notable for the following components:   WBC  14.1 (*)    Hemoglobin 12.7 (*)    HCT 38.5 (*)    Neutro Abs 10.9 (*)    Monocytes Absolute 1.1 (*)    Abs Immature Granulocytes 0.10 (*)    All other components within normal limits  PROTIME-INR     EKG    RADIOLOGY X-ray right knee interpreted by me, negative for fracture.  Radiology report reviewed  X-ray lumbar spine unremarkable CT head  unremarkable   PROCEDURES:  Procedures   MEDICATIONS ORDERED IN ED: Medications  oxyCODONE -acetaminophen  (PERCOCET/ROXICET) 5-325 MG per tablet 2 tablet (2 tablets Oral Given 07/21/23 1307)     IMPRESSION / MDM / ASSESSMENT AND PLAN / ED COURSE  I reviewed the triage vital signs and the nursing notes.  DDx: Intracranial hemorrhage, distal femur fracture, compression fracture, muscle strain, contusion  Patient's presentation is most consistent with acute presentation with potential threat to life or bodily function.  Patient presents after mechanical fall at home, has pain in the left lower back, right knee and in the head.  Patient presents with mechanical fall at home.  Has some mild head pain, along with left lower back and right knee pain.  Clinically he does have a right knee effusion.  X-rays and CT head are reassuring.  Labs unremarkable.  Stable for discharge       FINAL CLINICAL IMPRESSION(S) / ED DIAGNOSES   Final diagnoses:  Fall in home, initial encounter  Effusion of right knee  Contusion of scalp, initial encounter     Rx / DC Orders   ED Discharge Orders     None        Note:  This document was prepared using Dragon voice recognition software and may include unintentional dictation errors.   Jacquie Maudlin, MD 07/21/23 [phone]

## 2023-07-31 ENCOUNTER — Other Ambulatory Visit: Payer: Self-pay | Admitting: Internal Medicine

## 2023-07-31 ENCOUNTER — Ambulatory Visit
Admission: RE | Admit: 2023-07-31 | Discharge: 2023-07-31 | Disposition: A | Discharge status: Home / Self Care | Source: Ambulatory Visit | Attending: Internal Medicine | Admitting: Internal Medicine

## 2023-07-31 DIAGNOSIS — I639 Cerebral infarction, unspecified: Secondary | ICD-10-CM

## 2023-09-12 ENCOUNTER — Ambulatory Visit: Attending: Neurology | Admitting: Physical Therapy

## 2023-09-12 DIAGNOSIS — R269 Unspecified abnormalities of gait and mobility: Secondary | ICD-10-CM | POA: Diagnosis present

## 2023-09-12 DIAGNOSIS — R262 Difficulty in walking, not elsewhere classified: Secondary | ICD-10-CM | POA: Diagnosis present

## 2023-09-12 DIAGNOSIS — R2681 Unsteadiness on feet: Secondary | ICD-10-CM | POA: Diagnosis present

## 2023-09-12 DIAGNOSIS — M6281 Muscle weakness (generalized): Secondary | ICD-10-CM | POA: Insufficient documentation

## 2023-09-12 DIAGNOSIS — R2689 Other abnormalities of gait and mobility: Secondary | ICD-10-CM | POA: Insufficient documentation

## 2023-09-12 NOTE — Therapy (Signed)
 OUTPATIENT PHYSICAL THERAPY NEURO EVALUATION   Patient Name: Vernon Payne. MRN: [id number] DOB:Dec 15, 1950, 73 y.o., adult Today's Date: 09/12/2023   PCP: Cleotilde Oneil FALCON, MD  REFERRING PROVIDER: Maree Jannett POUR, MD  END OF SESSION:   Past Medical History:  Diagnosis Date   Actinic keratosis    Arthritis    Diabetes mellitus without complication (HCC)    GERD (gastroesophageal reflux disease)    Hard of hearing    left hearing aid   HLD (hyperlipidemia)    HTN (hypertension)    Hypotension, postural    IBS (irritable bowel syndrome)    Primary osteoarthritis of right knee    Sleep apnea    Squamous cell carcinoma of skin 01/20/2010   Right chest. Well differentiated. Excised: 01/27/2010   Squamous cell carcinoma of skin 07/15/2019   R preauricular ant to lobe   Stroke [facility])    May 31/ 2023   Wears dentures    partial upper, implants on bottom   Past Surgical History:  Procedure Laterality Date   APPENDECTOMY     BACK SURGERY     lumbar   CATARACT EXTRACTION W/PHACO Right 08/29/2017   Procedure: CATARACT EXTRACTION PHACO AND INTRAOCULAR LENS PLACEMENT (IOC) RIGHT DIABETIC;  Surgeon: Mittie Gaskin, MD;  Location: [facility];  Service: Ophthalmology;  Laterality: Right;  ISTENT INJECT   CATARACT EXTRACTION W/PHACO Left 11/21/2017   Procedure: CATARACT EXTRACTION PHACO AND INTRAOCULAR LENS PLACEMENT (IOC);  Surgeon: Mittie Gaskin, MD;  Location: [facility];  Service: Ophthalmology;  Laterality: Left;   COLONOSCOPY WITH PROPOFOL  N/A 01/26/2023   Procedure: COLONOSCOPY WITH PROPOFOL ;  Surgeon: Onita Elspeth Sharper, DO;  Location: [facility] ENDOSCOPY;  Service: Gastroenterology;  Laterality: N/A;   EYE SURGERY Left 11/21/2017   cataract excision   INSERTION OF ANTERIOR SEGMENT AQUEOUS DRAINAGE DEVICE (ISTENT) Right 08/29/2017   Procedure: (ISTENT);  Surgeon: Mittie Gaskin, MD;  Location: [facility];  Service: Ophthalmology;   Laterality: Right;  Diabetic - oral meds   INSERTION OF ANTERIOR SEGMENT AQUEOUS DRAINAGE DEVICE (ISTENT) Left 11/21/2017   Procedure: INSERTION OF ANTERIOR SEGMENT AQUEOUS DRAINAGE DEVICE (ISTENT)  INJECT DIABETES ISTENT INJECT;  Surgeon: Mittie Gaskin, MD;  Location: [facility] SURGERY [facility];  Service: Ophthalmology;  Laterality: Left;  Diabetic - oral meds   JOINT REPLACEMENT     left knee replacement   KNEE ARTHROPLASTY Right 04/30/2023   Procedure: ARTHROPLASTY, KNEE, TOTAL, USING IMAGELESS COMPUTER-ASSISTED NAVIGATION;  Surgeon: Mardee Lynwood SQUIBB, MD;  Location: [facility] ORS;  Service: Orthopedics;  Laterality: Right;   KYPHOPLASTY N/A 03/28/2019   Procedure: L1 KYPHOPLASTY;  Surgeon: Kathlynn Sharper, MD;  Location: [facility] ORS;  Service: Orthopedics;  Laterality: N/A;   LASIK     POLYPECTOMY  01/26/2023   Procedure: POLYPECTOMY;  Surgeon: Onita Elspeth Sharper, DO;  Location: [facility] ENDOSCOPY;  Service: Gastroenterology;;   ROTATOR CUFF REPAIR     TONSILLECTOMY     Patient Active Problem List   Diagnosis Date Noted   History of total knee arthroplasty, right 04/30/2023   Acute CVA (cerebrovascular accident) (HCC)    Right hemiparesis (HCC) 07/20/2021   Closed compression fracture of body of L1 vertebra (HCC) 04/28/2019   Family history of colon cancer 03/26/2018   Primary osteoarthritis of right knee 09/04/2017   Primary osteoarthritis of left knee 09/04/2017   Status post left partial knee replacement 09/04/2017   OSA on CPAP 01/24/2017   Yellow jacket sting 10/31/2016   HTN (hypertension) 10/31/2016   Type 2  diabetes mellitus with hyperlipidemia (HCC) 10/31/2016   HLD (hyperlipidemia) 10/31/2016   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  Rationale for Evaluation and Treatment: Rehabilitation  SUBJECTIVE:                                                                                                                                                                                              SUBJECTIVE STATEMENT: Pt had knee replacement in the R side on April 30, 2023. Pt had first fall on 06/2023. Pt had surgery with Dr. Hooten. Pt reports he still has clicking in the right knee and has pain in the right knee that he reports as being worse since before the surgery. Pt reports he is going to see the doctor in a few months again regarding the right knee. Pt reports not walking as well as he used to and notices he moves to the left when he is ambulating. Pt also notes recent unintentional weight loss, has discussed it with MD per his report.   Pt accompanied by: significant other  PERTINENT HISTORY: Pt familiar to clinic previously for post stroke rehab in later half of 2023. Pt has hx of  OA, Diabetes mellitus type 2, controlled, with complications, GERD, Glaucoma   Hemorrhoids   Hyperlipidemia,Hypertension, OSA on CPAP   PAIN:  Are you having pain? Yes: NPRS scale: 7 Pain location: R knee Pain description: constant Aggravating factors: kneeling Relieving factors: n/a  PRECAUTIONS: Fall  RED FLAGS: None   WEIGHT BEARING RESTRICTIONS: No  FALLS: Has patient fallen in last 6 months? Yes. Number of falls 6-7  LIVING ENVIRONMENT: Lives with: lives with their family Lives in: House/apartment Stairs: Yes: External: 7 steps; none Has following equipment at home: Single point cane and Walker - 2 wheeled  PLOF: Independent with basic ADLs and has lessened his activity and uses his wife to assist with things  PATIENT GOALS: Improve balance, gait and mobility   OBJECTIVE:  Note: Objective measures were completed at Evaluation unless otherwise noted.  DIAGNOSTIC FINDINGS: n/a  COGNITION: Overall cognitive status: Within functional limits for tasks assessed   SENSATION: Not tested  LOWER EXTREMITY ROM:     Active   Right Eval Left Eval  Hip flexion    Hip extension    Hip abduction    Hip adduction    Hip internal rotation    Hip external rotation    Knee flexion    Knee extension Clearly limited,  formally assess in future visits   Ankle dorsiflexion    Ankle plantarflexion    Ankle inversion    Ankle eversion     (Blank rows = not tested)  LOWER EXTREMITY MMT:    MMT Right Eval Left Eval  Hip flexion 3+ 4  Hip extension    Hip abduction 4 4+  Hip adduction 4+ 4+  Hip internal rotation    Hip external rotation    Knee flexion 4 4+  Knee extension 4 4+  Ankle dorsiflexion 4+ 4+  Ankle plantarflexion    Ankle inversion    Ankle eversion    (Blank rows = not tested)  BED MOBILITY:  No reported issues with this  TRANSFERS: Sit to stand: Modified independence and CGA  Assistive device utilized: hands needed to complete     Stand to sit: Modified independence  Assistive device utilized: constols descent with hands        STAIRS: Pt reports using step to pattern with at least one fall on steps in past 6 months.  GAIT: Findings: Gait Characteristics: R knee does get to full extension, decreased step length- Right, decreased step length- Left, decreased stride length, decreased hip/knee flexion- Right, and decreased hip/knee flexion- Left, Distance walked: 30 ft, Level of assistance: Modified independence, and Comments: with turning around pt shows visible instability and mutiple small steps to keep balance, reaches for objects when near them in counter/furniture surfing style walking   FUNCTIONAL TESTS:   Orthostatic assessment: seated 142/76 HR 79 standing 136/73 HR 83  5 times sit to stand: 35.22 sec with heavyUE A Timed up and go (TUG): 19.88 sec  6 minute walk test: : test visit 2  10 meter walk test: .81 m/s BERG test visit 2   PATIENT SURVEYS:  ABC scale: The Activities-Specific Balance Confidence (ABC) Scale 0% 10 20 30  40 50 60 70 80 90 100% No  confidence<->completely confident  "How confident are you that you will not lose your balance or become unsteady when you . . .   Date tested 09/12/23  Walk around the house 70%  2. Walk up or down stairs 60%  3. Bend over and pick up a slipper from in front of a closet floor 30%  4. Reach for a small can off a shelf at eye level 100%  5. Stand on tip toes and reach for something above your head 80%  6. Stand on a chair and reach for something 0%  7. Sweep the floor 40%  8. Walk outside the house to a car parked in the driveway 80%  9. Get into or out of a car 90%  10. Walk across a parking lot to the mall 80%  11. Walk up or down a ramp 80%  12. Walk in a crowded mall where people rapidly walk past you 70%  13. Are bumped into by people as you walk through the mall 70%  14. Step onto or off of an escalator while you are holding onto the railing 60%  15. Step onto or off an escalator while holding onto parcels such that you cannot hold onto the railing 60%  16. Walk outside on icy sidewalks 10%  Total: #/16 61.9%  TREATMENT DATE: 09/12/23   SELF CARE Patient instructed in plan of care, findings for evaluation, and ways of physical therapy may improve their function and quality of life.     PATIENT EDUCATION: Education details: POC Person educated: Patient Education method: Explanation Education comprehension: verbalized understanding   HOME EXERCISE PROGRAM: Establish visit 2    GOALS: Goals reviewed with patient? Yes  SHORT TERM GOALS: Target date: 10/10/2023       Patient will be independent in home exercise program to improve strength/mobility for better functional independence with ADLs. Baseline: No HEP currently  Goal status: INITIAL   LONG TERM GOALS: Target date: 12/05/2023    1.  Patient will complete five times sit to stand  test in < 15 seconds indicating an increased LE strength and improved balance. Baseline: 35.22 sec  with heavy UE assist Goal status: INITIAL  2.  Patient will improve ABC score to 71.5%   to demonstrate statistically significant improvement in mobility and quality of life as it relates to their balance and mobility.  Baseline: 61.9% Goal status: INITIAL   3.  Patient will increase Berg Balance score by > 6 points to demonstrate decreased fall risk during functional activities. Baseline: test visit 2  Goal status: INITIAL   4.   Patient will reduce timed up and go to <11 seconds to reduce fall risk and demonstrate improved transfer/gait ability. Baseline: 19.88 sec Goal status: INITIAL  5.   Patient will increase 10 meter walk test to >1.70m/s as to improve gait speed for better community ambulation and to reduce fall risk. Baseline: .2m/s Goal status: INITIAL  6.   Patient will increase six minute walk test distance by greater than 150 ft for progression to community ambulator and improve gait ability Baseline: test visit 2  Goal status: INITIAL    ASSESSMENT:  CLINICAL IMPRESSION: Patient is a 73 y.o. M who was seen today for physical therapy evaluation and treatment for imbalance. Pt and wife report at least 6 falls in the last 6 months occurring in a variety of environments and one involving a head laceration. Pt tested with several functional tests and his results also indicate an increased risk of falls and significant impairment of lower extremity strength. In addition pt has inadequate R knee extension motion for normal gait pattern. Based on evaluation findings, pt recent increase in fall frequency and the risks these pose for his safety pt will benefit from skilled PT intervention in order to address his balance and strength related impairments and to improve his QOL.   OBJECTIVE IMPAIRMENTS: Abnormal gait, decreased activity tolerance, decreased balance, decreased  endurance, decreased mobility, difficulty walking, decreased strength, and pain.   ACTIVITY LIMITATIONS: standing, squatting, stairs, transfers, and locomotion level  PARTICIPATION LIMITATIONS: shopping, community activity, and yard work  PERSONAL FACTORS: Age, Behavior pattern, Time since onset of injury/illness/exacerbation, and 3+ comorbidities: DM, HTN, OA, HLD, OSA, R knee pain are also affecting patient's functional outcome.   REHAB POTENTIAL: Good  CLINICAL DECISION MAKING: Evolving/moderate complexity  EVALUATION COMPLEXITY: Moderate  PLAN:  PT FREQUENCY: 2x/week  PT DURATION: 12 weeks  PLANNED INTERVENTIONS: 97750- Physical Performance Testing, 97110-Therapeutic exercises, 97530- Therapeutic activity, 97112- Neuromuscular re-education, 97535- Self Care, [id number]- Manual therapy, [phone]- Gait training, Balance training, Stair training, Joint mobilization, Cryotherapy, and Moist heat  PLAN FOR NEXT SESSION: BERG, , R knee goniometric measurement    Lonni KATHEE Gainer, PT 09/12/2023, 10:10 AM

## 2023-09-14 ENCOUNTER — Other Ambulatory Visit: Payer: Self-pay

## 2023-09-14 ENCOUNTER — Emergency Department

## 2023-09-14 ENCOUNTER — Encounter: Payer: Self-pay | Admitting: Emergency Medicine

## 2023-09-14 ENCOUNTER — Observation Stay
Admission: EM | Admit: 2023-09-14 | Discharge: 2023-09-15 | Disposition: A | Discharge status: Home / Self Care | Attending: Internal Medicine | Admitting: Internal Medicine

## 2023-09-14 DIAGNOSIS — F32A Depression, unspecified: Secondary | ICD-10-CM | POA: Diagnosis present

## 2023-09-14 DIAGNOSIS — I10 Essential (primary) hypertension: Secondary | ICD-10-CM | POA: Diagnosis present

## 2023-09-14 DIAGNOSIS — R202 Paresthesia of skin: Secondary | ICD-10-CM | POA: Diagnosis present

## 2023-09-14 DIAGNOSIS — S065X0A Traumatic subdural hemorrhage without loss of consciousness, initial encounter: Principal | ICD-10-CM | POA: Insufficient documentation

## 2023-09-14 DIAGNOSIS — S065XAA Traumatic subdural hemorrhage with loss of consciousness status unknown, initial encounter: Principal | ICD-10-CM | POA: Diagnosis present

## 2023-09-14 DIAGNOSIS — G4733 Obstructive sleep apnea (adult) (pediatric): Secondary | ICD-10-CM

## 2023-09-14 DIAGNOSIS — I609 Nontraumatic subarachnoid hemorrhage, unspecified: Secondary | ICD-10-CM | POA: Insufficient documentation

## 2023-09-14 DIAGNOSIS — W19XXXA Unspecified fall, initial encounter: Secondary | ICD-10-CM | POA: Diagnosis present

## 2023-09-14 DIAGNOSIS — E785 Hyperlipidemia, unspecified: Secondary | ICD-10-CM | POA: Diagnosis present

## 2023-09-14 DIAGNOSIS — E1142 Type 2 diabetes mellitus with diabetic polyneuropathy: Secondary | ICD-10-CM | POA: Diagnosis present

## 2023-09-14 DIAGNOSIS — S066XAA Traumatic subarachnoid hemorrhage with loss of consciousness status unknown, initial encounter: Secondary | ICD-10-CM | POA: Diagnosis not present

## 2023-09-14 DIAGNOSIS — R569 Unspecified convulsions: Secondary | ICD-10-CM | POA: Diagnosis not present

## 2023-09-14 DIAGNOSIS — E119 Type 2 diabetes mellitus without complications: Secondary | ICD-10-CM

## 2023-09-14 LAB — CBC
HCT: 31.9 % — ABNORMAL LOW (ref 39.0–52.0)
Hemoglobin: 10.6 g/dL — ABNORMAL LOW (ref 13.0–17.0)
MCH: 29.2 pg (ref 26.0–34.0)
MCHC: 33.2 g/dL (ref 30.0–36.0)
MCV: 87.9 fL (ref 80.0–100.0)
Platelets: 319 K/uL (ref 150–400)
RBC: 3.63 MIL/uL — ABNORMAL LOW (ref 4.22–5.81)
RDW: 14.5 % (ref 11.5–15.5)
WBC: 8.1 K/uL (ref 4.0–10.5)
nRBC: 0 % (ref 0.0–0.2)

## 2023-09-14 LAB — COMPREHENSIVE METABOLIC PANEL WITH GFR
ALT: 11 U/L (ref 0–44)
AST: 16 U/L (ref 15–41)
Albumin: 3.4 g/dL — ABNORMAL LOW (ref 3.5–5.0)
Alkaline Phosphatase: 78 U/L (ref 38–126)
Anion gap: 10 (ref 5–15)
BUN: 13 mg/dL (ref 8–23)
CO2: 25 mmol/L (ref 22–32)
Calcium: 8.7 mg/dL — ABNORMAL LOW (ref 8.9–10.3)
Chloride: 104 mmol/L (ref 98–111)
Creatinine, Ser: 1 mg/dL (ref 0.61–1.24)
GFR, Estimated: >60 mL/min (ref >60)
Glucose, Bld: 318 mg/dL — ABNORMAL HIGH (ref 70–99)
Potassium: 4.3 mmol/L (ref 3.5–5.1)
Sodium: 139 mmol/L (ref 135–145)
Total Bilirubin: 0.6 mg/dL (ref 0.0–1.2)
Total Protein: 6 g/dL — ABNORMAL LOW (ref 6.5–8.1)

## 2023-09-14 LAB — DIFFERENTIAL
Abs Immature Granulocytes: 0.08 K/uL — ABNORMAL HIGH (ref 0.00–0.07)
Basophils Absolute: 0.1 K/uL (ref 0.0–0.1)
Basophils Relative: 1 %
Eosinophils Absolute: 0.2 K/uL (ref 0.0–0.5)
Eosinophils Relative: 3 %
Immature Granulocytes: 1 %
Lymphocytes Relative: 21 %
Lymphs Abs: 1.7 K/uL (ref 0.7–4.0)
Monocytes Absolute: 0.6 K/uL (ref 0.1–1.0)
Monocytes Relative: 7 %
Neutro Abs: 5.5 K/uL (ref 1.7–7.7)
Neutrophils Relative %: 67 %

## 2023-09-14 LAB — GLUCOSE, CAPILLARY: Glucose-Capillary: 293 mg/dL — ABNORMAL HIGH (ref 70–99)

## 2023-09-14 LAB — PROTIME-INR
INR: 1 (ref 0.8–1.2)
Prothrombin Time: 13.3 s (ref 11.4–15.2)

## 2023-09-14 LAB — CBG MONITORING, ED: Glucose-Capillary: 303 mg/dL — ABNORMAL HIGH (ref 70–99)

## 2023-09-14 LAB — APTT: aPTT: 30 s (ref 24–36)

## 2023-09-14 LAB — ETHANOL: Alcohol, Ethyl (B): <15 mg/dL (ref <15)

## 2023-09-14 MED ORDER — ACETAMINOPHEN 325 MG PO TABS: 650.0000 mg | ORAL_TABLET | ORAL | Status: DC | PRN

## 2023-09-14 MED ORDER — CHLORHEXIDINE GLUCONATE CLOTH 2 % EX PADS: 6.0000 | MEDICATED_PAD | Freq: Every day | CUTANEOUS | Status: DC

## 2023-09-14 MED ORDER — LABETALOL HCL 5 MG/ML IV SOLN
10.0000 mg | Freq: Once | INTRAVENOUS | Status: AC
  Administered 2023-09-14: 10 mg via INTRAVENOUS
  Filled 2023-09-14: qty 4

## 2023-09-14 MED ORDER — ACETAMINOPHEN 650 MG RE SUPP: 650.0000 mg | RECTAL | Status: DC | PRN

## 2023-09-14 MED ORDER — VITAMIN B-12 1000 MCG PO TABS
1000.0000 ug | ORAL_TABLET | Freq: Every day | ORAL | Status: DC
Start: 2023-09-15 — End: 2023-09-15
  Administered 2023-09-15: 1000 ug via ORAL
  Filled 2023-09-14: qty 1

## 2023-09-14 MED ORDER — IOHEXOL 350 MG/ML SOLN
75.0000 mL | Freq: Once | INTRAVENOUS | Status: AC | PRN
  Administered 2023-09-14: 75 mL via INTRAVENOUS

## 2023-09-14 MED ORDER — SODIUM CHLORIDE 0.9% FLUSH: 3.0000 mL | Freq: Once | INTRAVENOUS | Status: DC

## 2023-09-14 MED ORDER — SIMVASTATIN 20 MG PO TABS
20.0000 mg | ORAL_TABLET | Freq: Every day | ORAL | Status: DC
  Administered 2023-09-14: 20 mg via ORAL
  Filled 2023-09-14: qty 1

## 2023-09-14 MED ORDER — CALCIUM CARBONATE ANTACID 500 MG PO CHEW
1000.0000 mg | CHEWABLE_TABLET | Freq: Every day | ORAL | Status: DC
  Filled 2023-09-14: qty 2

## 2023-09-14 MED ORDER — PANTOPRAZOLE SODIUM 40 MG IV SOLR
40.0000 mg | Freq: Every day | INTRAVENOUS | Status: DC
  Administered 2023-09-14: 40 mg via INTRAVENOUS
  Filled 2023-09-14: qty 10

## 2023-09-14 MED ORDER — STROKE: EARLY STAGES OF RECOVERY BOOK: Freq: Once | Status: DC

## 2023-09-14 MED ORDER — PAROXETINE HCL 20 MG PO TABS
20.0000 mg | ORAL_TABLET | Freq: Every day | ORAL | Status: DC
  Administered 2023-09-15: 20 mg via ORAL
  Filled 2023-09-14: qty 1

## 2023-09-14 MED ORDER — LATANOPROST 0.005 % OP SOLN
1.0000 [drp] | Freq: Every day | OPHTHALMIC | Status: DC
  Filled 2023-09-14: qty 2.5

## 2023-09-14 MED ORDER — NICARDIPINE HCL IN NACL 20-0.86 MG/200ML-% IV SOLN
0.0000 mg/h | INTRAVENOUS | Status: DC
  Administered 2023-09-14: 5 mg/h via INTRAVENOUS
  Filled 2023-09-14 (×2): qty 200

## 2023-09-14 MED ORDER — INSULIN ASPART 100 UNIT/ML IJ SOLN
0.0000 [IU] | Freq: Every day | INTRAMUSCULAR | Status: DC
  Administered 2023-09-14: 3 [IU] via SUBCUTANEOUS
  Filled 2023-09-14: qty 1

## 2023-09-14 MED ORDER — LORAZEPAM 2 MG/ML IJ SOLN: 2.0000 mg | INTRAMUSCULAR | Status: DC | PRN

## 2023-09-14 MED ORDER — SENNOSIDES-DOCUSATE SODIUM 8.6-50 MG PO TABS
1.0000 | ORAL_TABLET | Freq: Two times a day (BID) | ORAL | Status: DC
  Administered 2023-09-14 – 2023-09-15 (×2): 1 via ORAL
  Filled 2023-09-14 (×2): qty 1

## 2023-09-14 MED ORDER — CHLORHEXIDINE GLUCONATE CLOTH 2 % EX PADS
6.0000 | MEDICATED_PAD | Freq: Every day | CUTANEOUS | Status: DC
  Administered 2023-09-14: 6 via TOPICAL

## 2023-09-14 MED ORDER — LEVETIRACETAM (KEPPRA) 500 MG/5 ML ADULT IV PUSH
500.0000 mg | Freq: Once | INTRAVENOUS | Status: AC
  Administered 2023-09-14: 500 mg via INTRAVENOUS
  Filled 2023-09-14: qty 5

## 2023-09-14 MED ORDER — ACETAMINOPHEN 160 MG/5ML PO SOLN
650.0000 mg | ORAL | Status: DC | PRN
  Filled 2023-09-14: qty 20.3

## 2023-09-14 MED ORDER — LEVETIRACETAM 500 MG PO TABS
500.0000 mg | ORAL_TABLET | Freq: Two times a day (BID) | ORAL | Status: DC
  Administered 2023-09-14 – 2023-09-15 (×2): 500 mg via ORAL
  Filled 2023-09-14 (×2): qty 1

## 2023-09-14 MED ORDER — INSULIN ASPART 100 UNIT/ML IJ SOLN
0.0000 [IU] | Freq: Three times a day (TID) | INTRAMUSCULAR | Status: DC
  Administered 2023-09-15: 3 [IU] via SUBCUTANEOUS
  Administered 2023-09-15: 2 [IU] via SUBCUTANEOUS
  Filled 2023-09-14 (×3): qty 1

## 2023-09-14 NOTE — ED Notes (Addendum)
 Code stroke called by triage RN. Administrator made aware. Pt will be taken to CT 1. LKW 1425

## 2023-09-14 NOTE — Code Documentation (Signed)
 Stroke Response Nurse Documentation Code Documentation  Vernon Payne. is a 73 y.o. adult arriving to [facility] via [employer] on 09/14/2023 with past medical hx of DM, HTN, HLD, hard of hearing, previous stroke, sleep apnea. On aspirin  81 mg daily and clopidogrel  75 mg daily. Code stroke was activated by ED.   Patient from store where she was LKW at 1430 and now complaining of right leg numbness. Per wife, she was walking with patient in the store when he asked if he had stepped in something. He was noted to have a difficult time walking and right leg numb with his foot pointed inward, different than his normal. Concern for stroke, pt's wife brought him to the ED.  Pt noted to have a difficult time walking from CT scanner to wheelchair. He endorses that sensation feels back to baseline at time of evaluation. Pt reports he had a fall last week in which he fell face down hitting head on driveway.  Stroke team at the bedside on patient arrival. Labs drawn and patient cleared for CT by Dr. Willo. Patient to CT with team. NIHSS 1, see documentation for details and code stroke times. Patient with right limb ataxia on exam. The following imaging was completed:  CT Head. Patient is not a candidate for IV Thrombolytic due to blood seen on CT head, per MD.   Care Plan: Additional imaging ordered by provider. every hour NIHSS. SBP <150. Swallow screen per protocol.  Process Delays Noted: None  Bedside handoff with ED RN Leontine FABIENE Burnard KANDICE Hershel  Stroke Response RN

## 2023-09-14 NOTE — ED Notes (Signed)
 RN attempted to call report to CCU at this time. At this time ICU cannot take report. Report given to oncoming ED RN.

## 2023-09-14 NOTE — H&P (Signed)
 History and Physical    Vernon Payne. FMW:[id number] DOB: 04-Nov-1950 DOA: 09/14/2023  Referring MD/NP/PA:   PCP: Cleotilde Oneil FALCON, MD   Patient coming from:  The patient is coming from home.     Chief Complaint: Right lower leg numbness and difficulty walking  HPI: Vernon Payne. is a 73 y.o. adult with medical history significant of stroke 2023 with residual mild imbalance, HTN, HLD, DM, GERD, depression, hard of hearing, skin cancer, IBS, peripheral neuropathy, OSA not using CPAP, who presents with right lower leg numbness and difficulty walking.  Per pt and his wife at the bedside, they were walking in the store when he suddenly started having difficulty walking, feeling numb in his right lower leg and foot around 14:30 today. Per report, pt was noted to have a difficult time walking from CT scanner to wheelchair in ED. When I saw pt in ED, his symptoms have resolved.  Currently no unilateral numbness or weakness in extremities.  No facial droop or slurred speech.  Patient has a chronic hearing loss which has not changed.  No difficulty swallowing or vision loss.  Patient denies chest pain, cough, SOB.  No nausea, vomiting, diarrhea or abdominal pain.  No symptoms of UTI.   Of note, patient states that he had a fall last week in which he fell face down hitting head on driveway. He had headache which has resolved.  He did not pay attention.   Data reviewed independently and ED Course: pt was found to have WBC 8.1, GFR> 60, INR 1.0, PTT 30, alcohol level less than 15, temperature normal, blood pressure 171/91, heart rate 98 --> 71, RR 17, oxygen saturation 99% on room air.  Patient is admitted to stepdown as inpatient.  Dr. Jerrie of neurology and Dr. Katrina of neurosurgery were consulted by EDP.  CT of head at 15:25  1. Acute left parafalcine subdural hematoma and trace subarachnoid hemorrhage.  2.  Traumatic Brain Injury Risk Stratification   Skull Fracture: No - Low/mBIG 1    Subdural Hematoma (SDH): Yes   Subarachnoid Hemorrhage [facility]): Yes   Epidural Hematoma (EDH): No - Low/mBIG 1   Cerebral contusion, intra-axial, intraparenchymal Hemorrhage (IPH): No   Intraventricular Hemorrhage (IVH): No - Low/mBIG 1   Midline Shift > 1mm or Edema/effacement of sulci/vents: No - Low/mBIG 1   3. ASPECTS is 10.   CTA of head and neck: 1. 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 2. He centrally negative CT angiogram of the neck.    CT venogram: 1. Normal CT venogram. 2. Stable left parafalcine subdural hematoma.    MRI of the brain: pending    EKG: I have personally reviewed.  Sinus rhythm, QTc 451, borderline LAD, poor R wave progression.   Review of Systems:   General: no fevers, chills, no body weight gain, fatigue HEENT: no blurry vision, sore throat Respiratory: no dyspnea, coughing, wheezing CV: no chest pain, no palpitations GI: no nausea, vomiting, abdominal pain, diarrhea, constipation GU: no dysuria, burning on urination, increased urinary frequency, hematuria  Ext: no leg edema Neuro: has right lower leg numbness, difficulty walking, chronic hearing loss, recent fall Skin: no rash, no skin tear. MSK: No muscle spasm, no deformity, no limitation of range of movement in spin Heme: No easy bruising.  Travel history: No recent long distant travel.   Allergy:  Allergies  Allergen Reactions   Fluorouracil  Rash   Morphine  And Codeine Nausea And Vomiting and Other (See Comments)    Confusion     Past Medical History:  Diagnosis Date   Actinic keratosis    Arthritis    Diabetes mellitus without complication (HCC)    GERD (gastroesophageal reflux disease)    Hard of hearing    left hearing aid   HLD (hyperlipidemia)    HTN (hypertension)    Hypotension, postural    IBS (irritable bowel syndrome)    Primary osteoarthritis of right knee    Sleep apnea    Squamous cell  carcinoma of skin 01/20/2010   Right chest. Well differentiated. Excised: 01/27/2010   Squamous cell carcinoma of skin 07/15/2019   R preauricular ant to lobe   Stroke [facility])    May 31/ 2023   Wears dentures    partial upper, implants on bottom    Past Surgical History:  Procedure Laterality Date   APPENDECTOMY     BACK SURGERY     lumbar   CATARACT EXTRACTION W/PHACO Right 08/29/2017   Procedure: CATARACT EXTRACTION PHACO AND INTRAOCULAR LENS PLACEMENT (IOC) RIGHT DIABETIC;  Surgeon: Mittie Gaskin, MD;  Location: [facility];  Service: Ophthalmology;  Laterality: Right;  ISTENT INJECT   CATARACT EXTRACTION W/PHACO Left 11/21/2017   Procedure: CATARACT EXTRACTION PHACO AND INTRAOCULAR LENS PLACEMENT (IOC);  Surgeon: Mittie Gaskin, MD;  Location: [facility];  Service: Ophthalmology;  Laterality: Left;   COLONOSCOPY WITH PROPOFOL  N/A 01/26/2023   Procedure: COLONOSCOPY WITH PROPOFOL ;  Surgeon: Onita Elspeth Sharper, DO;  Location: [facility] END[facility]COPY;  Service: Gastroenterology;  Laterality: N/A;   EYE SURGERY Left 11/21/2017   cataract excision   INSERTION OF ANTERIOR SEGMENT AQUEOUS DRAINAGE DEVICE (ISTENT) Right 08/29/2017   Procedure: (ISTENT);  Surgeon: Mittie Gaskin, MD;  Location: [facility];  Service: Ophthalmology;  Laterality: Right;  Diabetic - oral meds   INSERTION OF ANTERIOR SEGMENT AQUEOUS DRAINAGE DEVICE (ISTENT) Left 11/21/2017   Procedure: INSERTION OF ANTERIOR SEGMENT AQUEOUS DRAINAGE DEVICE (ISTENT)  INJECT DIABETES ISTENT INJECT;  Surgeon: Mittie Gaskin, MD;  Location: [facility];  Service: Ophthalmology;  Laterality: Left;  Diabetic - oral meds   JOINT REPLACEMENT     left knee replacement   KNEE ARTHROPLASTY Right 04/30/2023   Procedure: ARTHROPLASTY, KNEE, TOTAL, USING IMAGELESS COMPUTER-ASSISTED NAVIGATION;  Surgeon: Mardee Lynwood SQUIBB, MD;  Location: [facility] ORS;  Service: Orthopedics;  Laterality: Right;   KYPHOPLASTY  N/A 03/28/2019   Procedure: L1 KYPHOPLASTY;  Surgeon: Kathlynn Sharper, MD;  Location: [facility] ORS;  Service: Orthopedics;  Laterality: N/A;   LASIK     POLYPECTOMY  01/26/2023   Procedure: POLYPECTOMY;  Surgeon: Onita Elspeth Sharper, DO;  Location: [facility] END[facility];  Service: Gastroenterology;;   ROTATOR CUFF REPAIR     TONSILLECTOMY      Social History:  reports that she has never smoked. She has never used smokeless tobacco. She reports that she does not drink alcohol and does not use drugs.  Family History:  Family History  Problem Relation Age of Onset   Diabetes Father      Prior to Admission medications   Medication Sig Start Date End Date Taking? Authorizing Provider  aspirin  81 MG chewable tablet Chew 1 tablet (81 mg total) by mouth 2 (two) times daily. 05/01/23   Drake Chew, PA-C  calcium elemental as carbonate (BARIATRIC TUMS ULTRA) 400 MG chewable tablet Chew 1,200 mg by mouth daily.    [provider]  celecoxib  (CELEBREX ) 200 MG capsule Take 1  capsule (200 mg total) by mouth 2 (two) times daily. 05/01/23   Drake Chew, PA-C  clobetasol  ointment (TEMOVATE ) 0.05 % Apply 1 Application topically 2 (two) times daily. Apply to affected area until inflammation resolves 06/15/23   Claudene Lehmann, MD  clopidogrel  (PLAVIX ) 75 MG tablet Take 1 tablet (75 mg total) by mouth daily. 07/21/21   Patel, Sona, MD  cyanocobalamin (VITAMIN B12) 1000 MCG tablet Take 1,000 mcg by mouth daily.    [provider]  diphenhydramine -acetaminophen  (TYLENOL  PM) 25-500 MG TABS tablet Take 2 tablets by mouth at bedtime.     [provider]  dorzolamide -timolol  (COSOPT ) 22.3-6.8 MG/ML ophthalmic solution Place 1 drop into both eyes 2 (two) times daily. 01/31/19   [provider]  glipiZIDE  (GLUCOTROL  XL) 10 MG 24 hr tablet Take 10 mg by mouth daily.    [provider]  latanoprost  (XALATAN ) 0.005 % ophthalmic solution Place 1 drop into both eyes at bedtime.     [provider]  metFORMIN  (GLUCOPHAGE ) 500 MG tablet Take 1,000 mg by mouth 2 (two) times daily with a meal.     [provider]  oxyCODONE  (OXY IR/ROXICODONE ) 5 MG immediate release tablet Take 1 tablet (5 mg total) by mouth every 4 (four) hours as needed for moderate pain (pain score 4-6) (pain score 4-6). 05/01/23   Drake Chew, PA-C  pantoprazole  (PROTONIX ) 40 MG tablet Take 40 mg by mouth daily.    [provider]  PARoxetine  (PAXIL ) 10 MG tablet Take 10 mg by mouth daily. 06/20/22 06/20/23  [provider]  simvastatin  (ZOCOR ) 20 MG tablet Take 20 mg by mouth at bedtime.    [provider]  traMADol  (ULTRAM ) 50 MG tablet Take 1-2 tablets (50-100 mg total) by mouth every 4 (four) hours as needed for moderate pain (pain score 4-6). 05/01/23   Drake Chew, PA-C  TRULICITY 0.75 MG/0.5ML SOPN Inject 0.75 mg into the skin every Sunday. 12/31/18   [provider]    Physical Exam: Vitals:   09/14/23 2000 09/14/23 2015 09/14/23 2038 09/14/23 2045  BP: (!) 152/69 (!) 150/70 (!) 151/80 (!) 145/77  Pulse: 80 82 88 84  Resp: 15 14 (!) 22 15  Temp:   97.9 F (36.6 C)   TempSrc:   Oral   SpO2: 98% 98% 98% 96%  Weight:   88.9 kg   Height:   6' 3.98 (1.93 m)    General: Not in acute distress HEENT:       Eyes: PERRL, EOMI, no jaundice       ENT: No discharge from the ears and nose, no pharynx injection, no tonsillar enlargement.        Neck: No JVD, no bruit, no mass felt. Heme: No neck lymph node enlargement. Cardiac: S1/S2, RRR, No murmurs, No gallops or rubs. Respiratory: No rales, wheezing, rhonchi or rubs. GI: Soft, nondistended, nontender, no rebound pain, no organomegaly, BS present. GU: No hematuria Ext: No pitting leg edema bilaterally. 1+DP/PT pulse bilaterally. Musculoskeletal: No joint deformities, No joint redness or warmth, no limitation of ROM in spin. Skin: No rashes.  Neuro: Alert, oriented X3, cranial nerves  II-XII grossly intact except for hearing loss, moves all extremities normally. Muscle strength 5/5 in all extremities, sensation to light touch intact.  Psych: Patient is not psychotic, no suicidal or hemocidal ideation.  Labs on Admission: I have personally reviewed following labs and imaging studies  CBC: Recent Labs  Lab 09/14/23 1531  WBC 8.1  NEUTROABS  5.5  HGB 10.6*  HCT 31.9*  MCV 87.9  PLT 319   Basic Metabolic Panel: Recent Labs  Lab 09/14/23 1531  NA 139  K 4.3  CL 104  CO2 25  GLUCOSE 318*  BUN 13  CREATININE 1.00  CALCIUM 8.7*   GFR: Estimated Creatinine Clearance (by C-G formula based on SCr of 1 mg/dL) Male: 35.8 mL/min Male: 79.6 mL/min Liver Function Tests: Recent Labs  Lab 09/14/23 1531  AST 16  ALT 11  ALKPHOS 78  BILITOT 0.6  PROT 6.0*  ALBUMIN 3.4*   No results for input(s): LIPASE, AMYLASE in the last 168 hours. No results for input(s): AMMONIA in the last 168 hours. Coagulation Profile: Recent Labs  Lab 09/14/23 1531  INR 1.0   Cardiac Enzymes: No results for input(s): CKTOTAL, CKMB, CKMBINDEX, TROPONINI in the last 168 hours. BNP (last 3 results) No results for input(s): PROBNP in the last 8760 hours. HbA1C: No results for input(s): HGBA1C in the last 72 hours. CBG: Recent Labs  Lab 09/14/23 1522 09/14/23 2037  GLUCAP 303* 293*   Lipid Profile: No results for input(s): CHOL, HDL, LDLCALC, TRIG, CHOLHDL, LDLDIRECT in the last 72 hours. Thyroid Function Tests: No results for input(s): TSH, T4TOTAL, FREET4, T3FREE, THYROIDAB in the last 72 hours. Anemia Panel: No results for input(s): VITAMINB12, FOLATE, FERRITIN, TIBC, IRON, RETICCTPCT in the last 72 hours. Urine analysis:    Component Value Date/Time   COLORURINE YELLOW (A) 04/24/2023 1431   APPEARANCEUR CLEAR (A) 04/24/2023 1431   LABSPEC 1.024 04/24/2023 1431   PHURINE 5.0 04/24/2023 1431   GLUCOSEU 150 (A)  04/24/2023 1431   HGBUR NEGATIVE 04/24/2023 1431   BILIRUBINUR NEGATIVE 04/24/2023 1431   KETONESUR NEGATIVE 04/24/2023 1431   PROTEINUR NEGATIVE 04/24/2023 1431   NITRITE NEGATIVE 04/24/2023 1431   LEUKOCYTESUR NEGATIVE 04/24/2023 1431   Sepsis Labs: @LABRCNTIP (procalcitonin:4,lacticidven:4) )No results found for this or any previous visit (from the past 240 hours).   Radiological Exams on Admission:   Assessment/Plan Principal Problem:   SDH (subdural hematoma) (HCC) Active Problems:   SAH (subarachnoid hemorrhage) (HCC)   Fall   Focal seizure (HCC)   HTN (hypertension)   HLD (hyperlipidemia)   Type 2 diabetes mellitus with peripheral neuropathy (HCC)   Depression   Assessment and Plan:  SDH (subdural hematoma) (HCC) and SAH (subarachnoid hemorrhage) (HCC): CT scan showed acute left parafalcine subdural hematoma and trace subarachnoid hemorrhage, without midline shift.  Patient symptoms have resolved. Dr. Jerrie of neurology and Dr. Katrina of neurosurgery were consulted. Per Dr. Jerrie, pt may have focal seizure, started Keppra, need EEG which can be done as outpatient, targeting blood pressure at SBP less than 150.  -will admit to SDU  --> changed to ICU level of care - Frequent neurocheck - N.p.o. pending swallowing screen.  If fail swallowing screen, will need SLP - Fall precaution - PT/OT - Hold aspirin  and Plavix  (patient took both this morning) - IV Cardene drip to target SBP 130-150 - f/u MRI of brain w and wo contrast per Dr. Jerrie - will repeat CT-head in AM  Possible focal seizure:  - per Dr. Jerrie, Routine EEG, okay to complete on outpatient basis as it would not change management at this time (and is not available after hours  / weekends at [facility]) - Keppra 500 mg BID indefinitely - Seizure precaution - As needed Ativan  for seizure - No driving for at least 6 months or until cleared by outpatient neurologist   Fall -  PT/ OT  HTN  (hypertension) - on Cardene gtt as above  HLD (hyperlipidemia) -Zocor   Type 2 diabetes mellitus with peripheral neuropathy (HCC): Recent A1c 7.0.  Patient is taking glipizide  and metformin  at home -SSI  Depression - Paxil          DVT ppx: SCD  Code Status: Full code    Family Communication:  Yes, patient's wife at bed side.      Disposition Plan:  Anticipate discharge back to previous environment  Consults called:  Dr. Jerrie of neurology and Dr. Katrina of neurosurgery were consulted by EDP.  Admission status and Level of care: ICU: as inpt        Dispo: The patient is from: Home              Anticipated d/c is to: Home              Anticipated d/c date is: 2 days              Patient currently is not medically stable to d/c.    Severity of Illness:  The appropriate patient status for this patient is INPATIENT. Inpatient status is judged to be reasonable and necessary in order to provide the required intensity of service to ensure the patient's safety. The patient's presenting symptoms, physical exam findings, and initial radiographic and laboratory data in the context of their chronic comorbidities is felt to place them at high risk for further clinical deterioration. Furthermore, it is not anticipated that the patient will be medically stable for discharge from the hospital within 2 midnights of admission.   * I certify that at the point of admission it is my clinical judgment that the patient will require inpatient hospital care spanning beyond 2 midnights from the point of admission due to high intensity of service, high risk for further deterioration and high frequency of surveillance required.*       Date of Service 09/14/2023    Caleb Exon [facility]   If 7PM-7AM, please contact night-coverage [email or web address] 09/14/2023, 9:06 PM

## 2023-09-14 NOTE — ED Notes (Signed)
 EDP, [place] to triage room.  Patient transported to CT at this time.

## 2023-09-14 NOTE — Progress Notes (Signed)
 CODE STROKE- PHARMACY COMMUNICATION   Time CODE STROKE called/page received: 1527  Time response to CODE STROKE was made (in person or via phone): Immediately  Time Stroke Kit retrieved from Pyxis (only if needed): N/A, too mild to treat  Name of Provider/Nurse contacted: Dr. Jerrie Marolyn KATHEE Clair 09/14/2023  3:42 PM

## 2023-09-14 NOTE — Consult Note (Signed)
 NEUROLOGY CONSULT NOTE   Date of service: September 14, 2023 Patient Name: Vernon Payne. MRN:  [id number] DOB:  07/28/1950 Chief Complaint: Right leg numbness / difficulty walking, transient Requesting Provider: Willo Dunnings, MD  History of Present Illness  Vernon Payne. is a 73 y.o. adult with hx of prior left corona radiata stroke with residual mild imbalance (06/2021, likely due to small vessel disease), hypertension, hyperlipidemia, diabetes, OSA on CPAP, bilateral knee replacements (right knee most recently in 06/30/2023).   Today he was ambulating at his baseline with his wife at a store when suddenly he felt as if he had stepped in something with his right foot and that it was difficult to walk.  He was brought to the ED for evaluation and code stroke was activated -- of note symptoms were rapidly improving during the course of my evaluation.  Head CT was notable for a parafalcine hemorrhage in a classic posttraumatic location and he did endorse a fall on 7/16 which was attributed to him perhaps feeling hot as he was outside pulling weeds.  He apparently fell face first and had a quite impressive orbital hematoma  Majority of the history is provided by wife, patient is somewhat inpatient historian and repeatedly states how he is eager to go home, although he does corroborate the history she provides.  Of note he has had a complicated recent history with knee replacement on 5/10 that was fairly uncomplicated.  However on 5/26 he suffered a tick bite and subsequently began to fall frequently 5/30, 5/31, this 6/3.  He did have a head CT on 5/31 which was negative for acute intracranial process.   He was evaluated for this by his PCP on 6/4, instructed to stop Tylenol  PM and start Augmentin for 7 days due to nonspecific leukocytosis.  Also noted to have some left-sided sciatica.  He was excessively fatigued with some chills although temperature was never checked at home.  No nausea,  vomiting, diarrhea beyond baseline intermittent IBS type symptoms.  However he did have poor appetite and in this setting wife reports he was losing almost 2 pounds per day at one point with a total weight loss of about 40 pounds since his knee surgery.  No rashes noted although he was having diffuse pain especially of his knees and back  Due to no improvement in his symptoms to return to his PCP on 6/10 at which time MRI brain was obtained which was negative for acute intracranial process, Lyme testing was also performed which was negative, but he was started on doxycycline  due to recent tick bite.  Wife reports he was initially improving on doxycycline  but after he completed his 10 days started to feel off again with vertiginous symptoms most noted on waking but sometimes brief episodes throughout the day.  These symptoms were becoming more frequent.  Due to concern for possible Rocky mounted spotted fever meningitis doxycycline  was extended for 5 more days at that point, and he seemed to fully recover, without any further falls until 7/16 as described above  He was also evaluated by neurologist on 6/12 for falls, felt to have neuropathy contributing - NCS/EMG of bilateral lower extremities showed neuropathy  LKW: 14:25 PM Modified rankin score: 1-2 IV Thrombolysis: No, ICH EVT: No, exam not consistent with LVO Hunt and Hess score: 1  NIHSS: 0    ROS  Comprehensive ROS performed and pertinent positives documented in HPI    Past History   Past Medical  History:  Diagnosis Date   Actinic keratosis    Arthritis    Diabetes mellitus without complication (HCC)    GERD (gastroesophageal reflux disease)    Hard of hearing    left hearing aid   HLD (hyperlipidemia)    HTN (hypertension)    Hypotension, postural    IBS (irritable bowel syndrome)    Primary osteoarthritis of right knee    Sleep apnea    Squamous cell carcinoma of skin 01/20/2010   Right chest. Well differentiated.  Excised: 01/27/2010   Squamous cell carcinoma of skin 07/15/2019   R preauricular ant to lobe   Stroke [facility])    May 31/ 2023   Wears dentures    partial upper, implants on bottom    Past Surgical History:  Procedure Laterality Date   APPENDECTOMY     BACK SURGERY     lumbar   CATARACT EXTRACTION W/PHACO Right 08/29/2017   Procedure: CATARACT EXTRACTION PHACO AND INTRAOCULAR LENS PLACEMENT (IOC) RIGHT DIABETIC;  Surgeon: Mittie Gaskin, MD;  Location: [facility];  Service: Ophthalmology;  Laterality: Right;  ISTENT INJECT   CATARACT EXTRACTION W/PHACO Left 11/21/2017   Procedure: CATARACT EXTRACTION PHACO AND INTRAOCULAR LENS PLACEMENT (IOC);  Surgeon: Mittie Gaskin, MD;  Location: [facility];  Service: Ophthalmology;  Laterality: Left;   COLONOSCOPY WITH PROPOFOL  N/A 01/26/2023   Procedure: COLONOSCOPY WITH PROPOFOL ;  Surgeon: Onita Elspeth Sharper, DO;  Location: [facility];  Service: Gastroenterology;  Laterality: N/A;   EYE SURGERY Left 11/21/2017   cataract excision   INSERTION OF ANTERIOR SEGMENT AQUEOUS DRAINAGE DEVICE (ISTENT) Right 08/29/2017   Procedure: (ISTENT);  Surgeon: Mittie Gaskin, MD;  Location: [facility];  Service: Ophthalmology;  Laterality: Right;  Diabetic - oral meds   INSERTION OF ANTERIOR SEGMENT AQUEOUS DRAINAGE DEVICE (ISTENT) Left 11/21/2017   Procedure: INSERTION OF ANTERIOR SEGMENT AQUEOUS DRAINAGE DEVICE (ISTENT)  INJECT DIABETES ISTENT INJECT;  Surgeon: Mittie Gaskin, MD;  Location: [facility];  Service: Ophthalmology;  Laterality: Left;  Diabetic - oral meds   JOINT REPLACEMENT     left knee replacement   KNEE ARTHROPLASTY Right 04/30/2023   Procedure: ARTHROPLASTY, KNEE, TOTAL, USING IMAGELESS COMPUTER-ASSISTED NAVIGATION;  Surgeon: Mardee Lynwood SQUIBB, MD;  Location: [facility] ORS;  Service: Orthopedics;  Laterality: Right;   KYPHOPLASTY N/A 03/28/2019   Procedure: L1 KYPHOPLASTY;  Surgeon: Kathlynn Sharper, MD;  Location: [facility] ORS;  Service: Orthopedics;  Laterality: N/A;   LASIK     POLYPECTOMY  01/26/2023   Procedure: POLYPECTOMY;  Surgeon: Onita Elspeth Sharper, DO;  Location: [facility];  Service: Gastroenterology;;   MARCELINE CUFF REPAIR     TONSILLECTOMY      Family History: Family History  Problem Relation Age of Onset   Diabetes Father     Social History  reports that she has never smoked. She has never used smokeless tobacco. She reports that she does not drink alcohol and does not use drugs.  Allergies  Allergen Reactions   Fluorouracil  Rash   Morphine And Codeine Nausea And Vomiting and Other (See Comments)    Confusion     Medications   Current Facility-Administered Medications:    sodium chloride  flush (NS) 0.9 % injection 3 mL, 3 mL, Intravenous, Once, Willo Dunnings, MD  Current Outpatient Medications:    aspirin  81 MG chewable tablet, Chew 1 tablet (81 mg total) by mouth 2 (two) times daily., Disp: , Rfl:    calcium elemental as carbonate (BARIATRIC TUMS ULTRA) 400 MG  chewable tablet, Chew 1,200 mg by mouth daily., Disp: , Rfl:    celecoxib  (CELEBREX ) 200 MG capsule, Take 1 capsule (200 mg total) by mouth 2 (two) times daily., Disp: 60 capsule, Rfl: 1   clobetasol  ointment (TEMOVATE ) 0.05 %, Apply 1 Application topically 2 (two) times daily. Apply to affected area until inflammation resolves, Disp: 60 g, Rfl: 0   clopidogrel  (PLAVIX ) 75 MG tablet, Take 1 tablet (75 mg total) by mouth daily., Disp: 30 tablet, Rfl: 3   cyanocobalamin (VITAMIN B12) 1000 MCG tablet, Take 1,000 mcg by mouth daily., Disp: , Rfl:    diphenhydramine -acetaminophen  (TYLENOL  PM) 25-500 MG TABS tablet, Take 2 tablets by mouth at bedtime. , Disp: , Rfl:    dorzolamide -timolol  (COSOPT ) 22.3-6.8 MG/ML ophthalmic solution, Place 1 drop into both eyes 2 (two) times daily., Disp: , Rfl:    glipiZIDE  (GLUCOTROL  XL) 10 MG 24 hr tablet, Take 10 mg by mouth daily., Disp: , Rfl:     latanoprost  (XALATAN ) 0.005 % ophthalmic solution, Place 1 drop into both eyes at bedtime., Disp: , Rfl:    metFORMIN  (GLUCOPHAGE ) 500 MG tablet, Take 1,000 mg by mouth 2 (two) times daily with a meal. , Disp: , Rfl:    oxyCODONE  (OXY IR/ROXICODONE ) 5 MG immediate release tablet, Take 1 tablet (5 mg total) by mouth every 4 (four) hours as needed for moderate pain (pain score 4-6) (pain score 4-6)., Disp: 30 tablet, Rfl: 0   pantoprazole  (PROTONIX ) 40 MG tablet, Take 40 mg by mouth daily., Disp: , Rfl:    PARoxetine  (PAXIL ) 10 MG tablet, Take 10 mg by mouth daily., Disp: , Rfl:    simvastatin  (ZOCOR ) 20 MG tablet, Take 20 mg by mouth at bedtime., Disp: , Rfl:    traMADol  (ULTRAM ) 50 MG tablet, Take 1-2 tablets (50-100 mg total) by mouth every 4 (four) hours as needed for moderate pain (pain score 4-6)., Disp: 30 tablet, Rfl: 0   TRULICITY 0.75 MG/0.5ML SOPN, Inject 0.75 mg into the skin every Sunday., Disp: , Rfl:   Vitals   Vitals:   09/14/23 1522 09/14/23 1545 09/14/23 1600 09/14/23 1615  BP:  131/77 137/76   Pulse:  79 76 85  Resp:  15 15 14   Temp:      TempSrc:      SpO2:  97% 98% 98%  Weight: 86.6 kg     Height: 6' 4 (1.93 m)       Body mass index is 23.25 kg/m.   Physical Exam   Constitutional: Appears well-developed and well-nourished.  Psych: Affect appropriate to situation.  Eyes: No scleral injection.  HENT: No OP obstruction.  Head: Normocephalic.  Cardiovascular: Normal rate and regular rhythm.  Respiratory: Effort normal, non-labored breathing.  GI: Soft.  No distension. There is no tenderness.  Skin: No rashes, varied brusing and healing dog scratches and bilateral knee abrasions.    Neurologic Examination   Mental Status: Patient is awake, alert, oriented to person, place, month, year, and situation. Patient is able to give a clear and coherent history. No signs of aphasia or neglect Cranial Nerves: II: Visual Fields are full. Pupils are equal, round, and  reactive to light.   III,IV, VI: EOMI without ptosis or diploplia.  V: Facial sensation is symmetric to temperature VII: Facial movement is symmetric.  VIII: hearing is hard of hearing L worse than R Motor: Tone is normal. Bulk is normal. 5/5 strength was present in all four extremities.  Sensory: Sensation is symmetric to  light touch and temperature in the arms and legs. He reports sensory abnormalities have essentially resolved  Plantars: Toes are downgoing bilaterally.  Cerebellar: FNF and HKS are intact bilaterally Gait:  Slow, imbalanced, somewhat dragging right foot when ambulating from CT scanner to wheelchair    Labs/Imaging/Neurodiagnostic studies   CBC:  Recent Labs  Lab 2023/09/28 1531  WBC 8.1  NEUTROABS 5.5  HGB 10.6*  HCT 31.9*  MCV 87.9  PLT 319   Basic Metabolic Panel:  Lab Results  Component Value Date   NA 139 09-28-2023   K 4.3 September 28, 2023   CO2 25 09-28-23   GLUCOSE 318 (H) 09-28-23   BUN 13 September 28, 2023   CREATININE 1.00 09-28-23   CALCIUM 8.7 (L) September 28, 2023   GFRNONAA >60 09-28-23   GFRAA >60 03/27/2019   Lipid Panel:  Lab Results  Component Value Date   CHOL 145 07/20/2021   HDL 45 07/20/2021   LDLCALC 53 07/20/2021   TRIG 233 (H) 07/20/2021   CHOLHDL 3.2 07/20/2021   HgbA1c:  Lab Results  Component Value Date   HGBA1C 7.0 (H) 04/24/2023   Alcohol Level     Component Value Date/Time   [facility] <15 28-Sep-2023 1531   INR  Lab Results  Component Value Date   INR 1.0 09/28/23   APTT  Lab Results  Component Value Date   APTT 30 September 28, 2023   CT Head without contrast(Personally reviewed): 1. Acute left parafalcine subdural hematoma and trace subarachnoid hemorrhage.  CT angio Head and Neck with contrast(Personally reviewed): 1. 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 2. [Essentially] negative CT angiogram of the neck.  CT Venogram 1. Normal CT venogram. 2. Stable  left parafalcine subdural hematoma.  ASSESSMENT   Vernon Payne. is a 73 y.o. adult presenting with transient right leg weakness.  He had a recent fall 7/16 and his imaging is most consistent with a traumatic subarachnoid hemorrhage.  However he did not have symptoms of leg weakness until today while walking, favor this to represent a small focal seizure given transient rapidly improving symptoms.  I do not feel the recent concern for tick borne illness is playing a significant role at this time given his improvement prior to most recent fall   RECOMMENDATIONS   Emergent recommendations: - CTA, CT venogram to rule out vascular contribution to bleed  Additional recommendations: - Routine EEG, okay to complete on outpatient basis as it would not change management at this time (and is not available after hours  / weekends at [facility]) - Keppra 500 mg BID indefinitely - Hold antiplatelets until cleared by neurosurgery to resume - Strict blood pressure control systolic less than 150, unless other recommendations by neurosurgery - Seizure precautions, no driving for at least 6 months or until cleared by outpatient neurologist  - Neurosurgical consultation for further recommendations and management of his ICH - Inpatient neurology will sign off at this time, please do not hesitate to reach out with any questions or concerns, including any further episodes of RLE weakness or seizure activity as would then adjust antiseizure medications ______________________________________________________________________   Lola Jernigan MD-PhD [facility] [phone] [facility] coverage for [facility] is from 8 AM to 4 AM in-house and 4 PM to 8 PM by telephone/video. 8 PM to 8 AM emergent questions or overnight urgent questions should be addressed to Teleneurology On-call or Jolynn Pack neurohospitalist; contact information can be found on AMION  CRITICAL CARE Performed  by: Lola LITTIE Jernigan   Total critical care time: 40 minutes  Critical care time was exclusive of separately billable procedures and treating other patients.  Critical care was necessary to treat or prevent imminent or life-threatening deterioration.  Critical care was time spent personally by me on the following activities: development of treatment plan with patient and/or surrogate as well as nursing, discussions with consultants, evaluation of patient's response to treatment, examination of patient, obtaining history from patient or surrogate, ordering and performing treatments and interventions, ordering and review of laboratory studies, ordering and review of radiographic studies, pulse oximetry and re-evaluation of patient's condition.

## 2023-09-14 NOTE — Progress Notes (Signed)
   09/14/23 1530  Spiritual Encounters  Type of Visit Initial  Care provided to: Pt and family  Conversation partners present during encounter Nurse  Reason for visit Code  OnCall Visit No   Chaplain visited patient and spouse for a CODE Stroke call to the [place] phone.  Chaplain offered a compassionate presence and reflective listening.  Spouse shared the patient had been here recently for the same concerns.  Chaplain asked if spouse needed anything and spouse asked for water, which the Chaplain provided.  Chaplain let patient and spouse know they can have the Nurse page the Chaplain, if needed.     Rev. Rana M. Nicholaus, M.Div. Chaplain Resident [facility]

## 2023-09-14 NOTE — Progress Notes (Signed)
 eLink Physician-Brief Progress Note Patient Name: Vernon Payne. DOB: May 26, 1950 MRN: [id number]   Date of Service  09/14/2023  HPI/Events of Note  Patient admitted with a sub-dural hematoma and a small subarachnoid hemorrhage s/p a fall nine days ago.  eICU Interventions  New Patient Evaluation.        [name] 09/14/2023, 9:28 PM

## 2023-09-14 NOTE — ED Provider Notes (Signed)
 [facility] Provider Note    Event Date/Time   First MD Initiated Contact with Patient 09/14/23 1529     (approximate)   History   Chief Complaint Numbness and Code Stroke   HPI  Vernon Payne. is a 73 y.o. adult with past medical history of hypertension, hyperlipidemia, diabetes, and stroke who presents to the ED complaining of code stroke.  Patient reports that approximately 45 minutes prior to arrival he developed numbness and heaviness in his right foot.  He states that his foot felt like it was dead weight, which was causing him to have difficulty walking.  He reports some mild headache, but denies vision changes, speech changes, facial droop, or symptoms in his other extremities.  He reports taking Plavix  for prior stroke.     Physical Exam   Triage Vital Signs: ED Triage Vitals  Encounter Vitals Group     BP 09/14/23 1518 125/69     Girls Systolic BP Percentile --      Girls Diastolic BP Percentile --      Boys Systolic BP Percentile --      Boys Diastolic BP Percentile --      Pulse Rate 09/14/23 1518 98     Resp --      Temp 09/14/23 1518 98.5 F (36.9 C)     Temp Source 09/14/23 1518 Oral     SpO2 09/14/23 1518 98 %     Weight 09/14/23 1522 191 lb (86.6 kg)     Height 09/14/23 1522 6' 4 (1.93 m)     Head Circumference --      Peak Flow --      Pain Score 09/14/23 1522 3     Pain Loc --      Pain Education --      Exclude from Growth Chart --     Most recent vital signs: Vitals:   09/14/23 1820 09/14/23 1834  BP: (!) 171/91 (!) 162/93  Pulse: 70 68  Resp: 17 12  Temp:    SpO2: 99% 100%    Constitutional: Alert and oriented. Eyes: Conjunctivae are normal. Head: Atraumatic. Nose: No congestion/rhinnorhea. Mouth/Throat: Mucous membranes are moist.  Cardiovascular: Normal rate, regular rhythm. Grossly normal heart sounds.  2+ radial pulses bilaterally. Respiratory: Normal respiratory effort.  No retractions. Lungs  CTAB. Gastrointestinal: Soft and nontender. No distention. Musculoskeletal: No lower extremity tenderness nor edema.  Neurologic:  Normal speech and language. No gross focal neurologic deficits are appreciated.    ED Results / Procedures / Treatments   Labs (all labs ordered are listed, but only abnormal results are displayed) Labs Reviewed  CBC - Abnormal; Notable for the following components:      Result Value   RBC 3.63 (*)    Hemoglobin 10.6 (*)    HCT 31.9 (*)    All other components within normal limits  DIFFERENTIAL - Abnormal; Notable for the following components:   Abs Immature Granulocytes 0.08 (*)    All other components within normal limits  COMPREHENSIVE METABOLIC PANEL WITH GFR - Abnormal; Notable for the following components:   Glucose, Bld 318 (*)    Calcium 8.7 (*)    Total Protein 6.0 (*)    Albumin 3.4 (*)    All other components within normal limits  CBG MONITORING, ED - Abnormal; Notable for the following components:   Glucose-Capillary 303 (*)    All other components within normal limits  PROTIME-INR  APTT  ETHANOL  EKG  ED ECG REPORT I, Carlin Palin, the attending physician, personally viewed and interpreted this ECG.   Date: 09/14/2023  EKG Time: 15:44  Rate: 81  Rhythm: normal sinus rhythm  Axis: Normal  Intervals:none  ST&T Change: None  RADIOLOGY CT head reviewed and interpreted by me with parafalcine SDH, no midline shift.  PROCEDURES:  Critical Care performed: Yes, see critical care procedure note(s)  .Critical Care  Performed by: Palin Carlin, MD Authorized by: Palin Carlin, MD   Critical care provider statement:    Critical care time (minutes):  30   Critical care time was exclusive of:  Separately billable procedures and treating other patients and teaching time   Critical care was necessary to treat or prevent imminent or life-threatening deterioration of the following conditions:  CNS failure or compromise    Critical care was time spent personally by me on the following activities:  Development of treatment plan with patient or surrogate, discussions with consultants, evaluation of patient's response to treatment, examination of patient, ordering and review of laboratory studies, ordering and review of radiographic studies, ordering and performing treatments and interventions, pulse oximetry, re-evaluation of patient's condition and review of old charts   I assumed direction of critical care for this patient from another provider in my specialty: no     Care discussed with: admitting provider      MEDICATIONS ORDERED IN ED: Medications  sodium chloride  flush (NS) 0.9 % injection 3 mL ( Intravenous Canceled Entry 09/14/23 1528)  levETIRAcetam (KEPPRA) tablet 500 mg (has no administration in time range)  iohexol  (OMNIPAQUE ) 350 MG/ML injection 75 mL (75 mLs Intravenous Contrast Given 09/14/23 1605)  levETIRAcetam (KEPPRA) undiluted injection 500 mg (500 mg Intravenous Given 09/14/23 1642)  labetalol  (NORMODYNE ) injection 10 mg (10 mg Intravenous Given 09/14/23 1828)     IMPRESSION / MDM / ASSESSMENT AND PLAN / ED COURSE  I reviewed the triage vital signs and the nursing notes.                              73 y.o. adult with past medical history of hypertension, hyperlipidemia, diabetes, and stroke who presents to the ED complaining of acute onset numbness and heaviness in his right foot and lower leg about 45 minutes prior to arrival.  Patient's presentation is most consistent with acute presentation with potential threat to life or bodily function.  Differential diagnosis includes, but is not limited to, stroke, TIA, anemia, electrolyte abnormality, hypoglycemia, seizure.  Patient nontoxic-appearing and in no acute distress, vital signs are unremarkable.  He reports acute onset right leg numbness and weakness, which seems to be resolving although patient still reports some subjective numbness in  the leg.  Code stroke was activated, CT head concerning for small subdural hematoma along with a small amount of subarachnoid blood.  Patient's wife states that he had a fall 9 days ago where he struck his head on concrete, did not seek care at that time.  Findings reviewed with Dr. Clois of neurosurgery, who does not recommend any further intervention at this time, no need for repeat CT imaging.  Dr. Peggi concern for focal seizure as the source of patient's symptoms and has ordered IV Keppra.  Neurologic deficit has resolved on reassessment.  Labs without significant anemia, leukocytosis, electrolyte abnormality, or AKI.  LFTs and coags are unremarkable.  Case discussed with hospitalist for admission.      FINAL CLINICAL IMPRESSION(S) /  ED DIAGNOSES   Final diagnoses:  SDH (subdural hematoma) (HCC)  Partial seizure (HCC)     Rx / DC Orders   ED Discharge Orders     None        Note:  This document was prepared using Dragon voice recognition software and may include unintentional dictation errors.   Vernon Dunnings, MD 09/14/23 [id number]

## 2023-09-14 NOTE — ED Notes (Signed)
 Called carelink spoke to [facility] to activate code stroke 1423

## 2023-09-14 NOTE — ED Triage Notes (Addendum)
 Pt in via POV, reports sudden onset right lower leg numbness w/ difficulty ambulating while in a store.  Onset 1430 today.  Also complains of slight headache, denies any other neurological involvement.  Vitals WDL at this time.  Hx CVA 2 years ago.

## 2023-09-14 NOTE — Plan of Care (Signed)
  Problem: Education: Goal: Knowledge of disease or condition will improve Outcome: Progressing Goal: Knowledge of secondary prevention will improve (MUST DOCUMENT ALL) Outcome: Progressing Goal: Knowledge of patient specific risk factors will improve (DELETE if not current risk factor) Outcome: Progressing   Problem: Intracerebral Hemorrhage Tissue Perfusion: Goal: Complications of Intracerebral Hemorrhage will be minimized Outcome: Progressing   Problem: Coping: Goal: Will verbalize positive feelings about self Outcome: Progressing Goal: Will identify appropriate support needs Outcome: Progressing   Problem: Health Behavior/Discharge Planning: Goal: Ability to manage health-related needs will improve Outcome: Progressing Goal: Goals will be collaboratively established with patient/family Outcome: Progressing   Problem: Self-Care: Goal: Ability to participate in self-care as condition permits will improve Outcome: Progressing Goal: Verbalization of feelings and concerns over difficulty with self-care will improve Outcome: Progressing Goal: Ability to communicate needs accurately will improve Outcome: Progressing   Problem: Nutrition: Goal: Risk of aspiration will decrease Outcome: Progressing Goal: Dietary intake will improve Outcome: Progressing   Problem: Education: Goal: Ability to describe self-care measures that may prevent or decrease complications (Diabetes Survival Skills Education) will improve Outcome: Progressing Goal: Individualized Educational Video(s) Outcome: Progressing   Problem: Coping: Goal: Ability to adjust to condition or change in health will improve Outcome: Progressing   Problem: Fluid Volume: Goal: Ability to maintain a balanced intake and output will improve Outcome: Progressing   Problem: Health Behavior/Discharge Planning: Goal: Ability to identify and utilize available resources and services will improve Outcome: Progressing Goal:  Ability to manage health-related needs will improve Outcome: Progressing   Problem: Metabolic: Goal: Ability to maintain appropriate glucose levels will improve Outcome: Progressing   Problem: Nutritional: Goal: Maintenance of adequate nutrition will improve Outcome: Progressing Goal: Progress toward achieving an optimal weight will improve Outcome: Progressing   Problem: Skin Integrity: Goal: Risk for impaired skin integrity will decrease Outcome: Progressing   Problem: Tissue Perfusion: Goal: Adequacy of tissue perfusion will improve Outcome: Progressing   Problem: Education: Goal: Knowledge of General Education information will improve Description: Including pain rating scale, medication(s)/side effects and non-pharmacologic comfort measures Outcome: Progressing   Problem: Health Behavior/Discharge Planning: Goal: Ability to manage health-related needs will improve Outcome: Progressing   Problem: Clinical Measurements: Goal: Ability to maintain clinical measurements within normal limits will improve Outcome: Progressing Goal: Will remain free from infection Outcome: Progressing Goal: Diagnostic test results will improve Outcome: Progressing Goal: Respiratory complications will improve Outcome: Progressing Goal: Cardiovascular complication will be avoided Outcome: Progressing   Problem: Activity: Goal: Risk for activity intolerance will decrease Outcome: Progressing   Problem: Nutrition: Goal: Adequate nutrition will be maintained Outcome: Progressing   Problem: Coping: Goal: Level of anxiety will decrease Outcome: Progressing   Problem: Elimination: Goal: Will not experience complications related to bowel motility Outcome: Progressing Goal: Will not experience complications related to urinary retention Outcome: Progressing   Problem: Pain Managment: Goal: General experience of comfort will improve and/or be controlled Outcome: Progressing   Problem:  Safety: Goal: Ability to remain free from injury will improve Outcome: Progressing   Problem: Skin Integrity: Goal: Risk for impaired skin integrity will decrease Outcome: Progressing

## 2023-09-15 ENCOUNTER — Inpatient Hospital Stay

## 2023-09-15 DIAGNOSIS — E1142 Type 2 diabetes mellitus with diabetic polyneuropathy: Secondary | ICD-10-CM

## 2023-09-15 DIAGNOSIS — E785 Hyperlipidemia, unspecified: Secondary | ICD-10-CM | POA: Diagnosis not present

## 2023-09-15 DIAGNOSIS — S065XAA Traumatic subdural hemorrhage with loss of consciousness status unknown, initial encounter: Secondary | ICD-10-CM | POA: Diagnosis not present

## 2023-09-15 DIAGNOSIS — W19XXXA Unspecified fall, initial encounter: Secondary | ICD-10-CM | POA: Diagnosis not present

## 2023-09-15 DIAGNOSIS — F3289 Other specified depressive episodes: Secondary | ICD-10-CM

## 2023-09-15 DIAGNOSIS — R569 Unspecified convulsions: Secondary | ICD-10-CM | POA: Diagnosis not present

## 2023-09-15 DIAGNOSIS — S066XAA Traumatic subarachnoid hemorrhage with loss of consciousness status unknown, initial encounter: Secondary | ICD-10-CM | POA: Diagnosis not present

## 2023-09-15 DIAGNOSIS — I10 Essential (primary) hypertension: Secondary | ICD-10-CM | POA: Diagnosis not present

## 2023-09-15 LAB — GLUCOSE, CAPILLARY
Glucose-Capillary: 181 mg/dL — ABNORMAL HIGH (ref 70–99)
Glucose-Capillary: 217 mg/dL — ABNORMAL HIGH (ref 70–99)

## 2023-09-15 MED ORDER — DIAZEPAM 5 MG/ML IJ SOLN
2.5000 mg | Freq: Once | INTRAMUSCULAR | Status: DC
  Filled 2023-09-15: qty 2

## 2023-09-15 MED ORDER — AMLODIPINE BESYLATE 5 MG PO TABS: 5.0000 mg | ORAL_TABLET | Freq: Every day | ORAL | 0 refills | Status: AC

## 2023-09-15 MED ORDER — GADOBUTROL 1 MMOL/ML IV SOLN
9.0000 mL | Freq: Once | INTRAVENOUS | Status: AC | PRN
  Administered 2023-09-15: 9 mL via INTRAVENOUS

## 2023-09-15 MED ORDER — AMLODIPINE BESYLATE 5 MG PO TABS
5.0000 mg | ORAL_TABLET | Freq: Every day | ORAL | Status: DC
  Administered 2023-09-15: 5 mg via ORAL
  Filled 2023-09-15: qty 1

## 2023-09-15 MED ORDER — LEVETIRACETAM 500 MG PO TABS: 500.0000 mg | ORAL_TABLET | Freq: Two times a day (BID) | ORAL | 0 refills | Status: AC

## 2023-09-15 NOTE — Assessment & Plan Note (Signed)
 Continue Zocor

## 2023-09-15 NOTE — Assessment & Plan Note (Signed)
 Continue Paxil.

## 2023-09-15 NOTE — Assessment & Plan Note (Signed)
 Patient was on sliding scale while here in the hospital can go back on glipizide  and metformin  as outpatient

## 2023-09-15 NOTE — Consult Note (Addendum)
 Consult requested by:  Dr. Ginnie  Consult requested for:  Subdural hematoma  Primary Physician:  Cleotilde Oneil FALCON, MD  History of Present Illness: 09/15/2023 Ms. Vernon Payne is here today with a chief complaint of transient right lower extremity numbness which caused some difficulty with walking.  He had subsequent improvement in symptoms.  He was scanned and found to have a subdural hematoma.  He was evaluated by my colleague in neurology who felt that he had had a focal seizure.  He has been started on antiseizure medication.  He was observed overnight and is now at his baseline.  He has no complaints currently.  Of note, he is on aspirin  and Plavix .  He had a trauma approximately 1 week ago.  I have utilized the care everywhere function in epic to review the outside records available from external health systems.  Review of Systems:  A 10 point review of systems is negative, except for the pertinent positives and negatives detailed in the HPI.  Past Medical History: Past Medical History:  Diagnosis Date   Actinic keratosis    Arthritis    Diabetes mellitus without complication (HCC)    GERD (gastroesophageal reflux disease)    Hard of hearing    left hearing aid   HLD (hyperlipidemia)    HTN (hypertension)    Hypotension, postural    IBS (irritable bowel syndrome)    Primary osteoarthritis of right knee    Sleep apnea    Squamous cell carcinoma of skin 01/20/2010   Right chest. Well differentiated. Excised: 01/27/2010   Squamous cell carcinoma of skin 07/15/2019   R preauricular ant to lobe   Stroke [facility])    May 31/ 2023   Wears dentures    partial upper, implants on bottom    Past Surgical History: Past Surgical History:  Procedure Laterality Date   APPENDECTOMY     BACK SURGERY     lumbar   CATARACT EXTRACTION W/PHACO Right 08/29/2017   Procedure: CATARACT EXTRACTION PHACO AND INTRAOCULAR LENS PLACEMENT (IOC) RIGHT DIABETIC;  Surgeon: Mittie Gaskin, MD;  Location: [facility];  Service: Ophthalmology;  Laterality: Right;  ISTENT INJECT   CATARACT EXTRACTION W/PHACO Left 11/21/2017   Procedure: CATARACT EXTRACTION PHACO AND INTRAOCULAR LENS PLACEMENT (IOC);  Surgeon: Mittie Gaskin, MD;  Location: [facility];  Service: Ophthalmology;  Laterality: Left;   COLONOSCOPY WITH PROPOFOL  N/A 01/26/2023   Procedure: COLONOSCOPY WITH PROPOFOL ;  Surgeon: Onita Elspeth Sharper, DO;  Location: [facility] ENDOSCOPY;  Service: Gastroenterology;  Laterality: N/A;   EYE SURGERY Left 11/21/2017   cataract excision   INSERTION OF ANTERIOR SEGMENT AQUEOUS DRAINAGE DEVICE (ISTENT) Right 08/29/2017   Procedure: (ISTENT);  Surgeon: Mittie Gaskin, MD;  Location: [facility];  Service: Ophthalmology;  Laterality: Right;  Diabetic - oral meds   INSERTION OF ANTERIOR SEGMENT AQUEOUS DRAINAGE DEVICE (ISTENT) Left 11/21/2017   Procedure: INSERTION OF ANTERIOR SEGMENT AQUEOUS DRAINAGE DEVICE (ISTENT)  INJECT DIABETES ISTENT INJECT;  Surgeon: Mittie Gaskin, MD;  Location: [facility];  Service: Ophthalmology;  Laterality: Left;  Diabetic - oral meds   JOINT REPLACEMENT     left knee replacement   KNEE ARTHROPLASTY Right 04/30/2023   Procedure: ARTHROPLASTY, KNEE, TOTAL, USING IMAGELESS COMPUTER-ASSISTED NAVIGATION;  Surgeon: Mardee Lynwood SQUIBB, MD;  Location: [facility] ORS;  Service: Orthopedics;  Laterality: Right;   KYPHOPLASTY N/A 03/28/2019   Procedure: L1 KYPHOPLASTY;  Surgeon: Kathlynn Sharper, MD;  Location: [facility] ORS;  Service: Orthopedics;  Laterality:  N/A;   LASIK     POLYPECTOMY  01/26/2023   Procedure: POLYPECTOMY;  Surgeon: Onita Elspeth Sharper, DO;  Location: [facility] ENDOSCOPY;  Service: Gastroenterology;;   MARCELINE CUFF REPAIR     TONSILLECTOMY      Allergies: Allergies as of 09/14/2023 - Review Complete 09/14/2023  Allergen Reaction Noted   Fluorouracil  Rash 06/15/2023   Morphine and codeine Nausea And Vomiting and  Other (See Comments) 10/30/2016    Medications: Current Meds  Medication Sig   Baclofen 5 MG TABS Take 1 tablet by mouth 3 (three) times daily.   calcium  elemental as carbonate (BARIATRIC TUMS ULTRA) 400 MG chewable tablet Chew 1,200 mg by mouth daily.   clobetasol  ointment (TEMOVATE ) 0.05 % Apply 1 Application topically 2 (two) times daily. Apply to affected area until inflammation resolves   clopidogrel  (PLAVIX ) 75 MG tablet Take 1 tablet (75 mg total) by mouth daily.   cyanocobalamin  (VITAMIN B12) 1000 MCG tablet Take 1,000 mcg by mouth daily.   diphenhydramine -acetaminophen  (TYLENOL  PM) 25-500 MG TABS tablet Take 2 tablets by mouth at bedtime.    dorzolamide -timolol  (COSOPT ) 22.3-6.8 MG/ML ophthalmic solution Place 1 drop into both eyes 2 (two) times daily.   doxycycline  (VIBRAMYCIN ) 100 MG capsule Take 100 mg by mouth 2 (two) times daily.   glipiZIDE  (GLUCOTROL  XL) 5 MG 24 hr tablet Take 5 mg by mouth.  Take 1 tablet (5 mg total) by mouth once daily   latanoprost  (XALATAN ) 0.005 % ophthalmic solution Place 1 drop into both eyes at bedtime.   metFORMIN  (GLUCOPHAGE ) 500 MG tablet Take 1,000 mg by mouth 2 (two) times daily with a meal.    pantoprazole  (PROTONIX ) 40 MG tablet Take 40 mg by mouth daily.   PARoxetine  (PAXIL ) 20 MG tablet Take 20 mg by mouth daily.   simvastatin  (ZOCOR ) 20 MG tablet Take 20 mg by mouth at bedtime.   SSD 1 % cream Apply topically 2 (two) times daily. Topical Twice Daily    Social History: Social History   Tobacco Use   Smoking status: Never   Smokeless tobacco: Never  Vaping Use   Vaping status: Never Used  Substance Use Topics   Alcohol use: No   Drug use: No    Family Medical History: Family History  Problem Relation Age of Onset   Diabetes Father     Physical Examination: Vitals:   09/15/23 0806 09/15/23 1010  BP: (!) 143/89 (!) 153/91  Pulse: 63 75  Resp: 18 17  Temp: 97.9 F (36.6 C)   SpO2: 96% 100%    General: Patient is in no  apparent distress. Attention to examination is appropriate.  Neck:   Supple.  Full range of motion.  Respiratory: Patient is breathing without any difficulty.   NEUROLOGICAL:     Awake, alert, oriented to person, place, and time.  Speech is clear and fluent.  Cranial Nerves: Pupils equal round and reactive to light.  Facial tone is symmetric.  Facial sensation is symmetric. Shoulder shrug is symmetric. Tongue protrusion is midline.  There is no pronator drift.  Strength: Side Biceps Triceps Deltoid Interossei Grip Wrist Ext. Wrist Flex.  R 5 5 5 5 5 5 5   L 5 5 5 5 5 5 5    Side Iliopsoas Quads Hamstring PF DF EHL  R 5 5 5 5 5 5   L 5 5 5 5 5 5    Bilateral upper and lower extremity sensation is intact to light touch.    No evidence  of dysmetria noted.  Gait is untested.     Medical Decision Making  Imaging: MRI Brain 09/15/2023 IMPRESSION: 1. When comparing across modalities, similar size of a left parafalcine subdural hemorrhage. 2. No specific findings to suggest meningitis; however, please note that lumbar puncture could provide more sensitive evaluation if clinically warranted.     Electronically Signed   By: Gilmore GORMAN Molt M.D.   On: 09/15/2023 02:35  I have personally reviewed the images and agree with the above interpretation.  Assessment and Plan: Ms. Morton is a pleasant 73 y.o. adult with traumatic subdural hematoma.  I agree with my colleague in neurology that he may have suffered a seizure.  He is currently back to his baseline.  I would not recommend surgical intervention.  He should stay off Plavix  for another week or so.  Will see him back in clinic in 3 to 4 weeks with a repeat scan.  I will defer management of antiepileptics to my colleagues in neurology.    I have communicated my recommendations to the requesting physician and coordinated care to facilitate these recommendations.     Louden Houseworth K. Clois MD, [facility] Neurosurgery

## 2023-09-15 NOTE — Progress Notes (Signed)
 SLP Cancellation Note  Patient Details Name: Vernon Payne. MRN: [id number] DOB: 1950-07-15   Cancelled treatment:       Reason Eval/Treat Not Completed: SLP screened, no needs identified, will sign off  Chart review completed. CT Head revealing, Acute left parafalcine subdural hematoma and trace subarachnoid hemorrhage. MRI revealing No evidence of acute infarct, mass lesion, midline shift or hydrocephalus. When comparing across modalities, similar left parafalcine subdural hemorrhage which demonstrates some enhancement, susceptibility artifact and FLAIR hyperintensity. Outside of this hemorrhage, there is no additional sulcal FLAIR hyperintensity or enhancement to suggest meningitis/encephalitis. Per Neurology note, pt able to provide clear history, oriented, with intact language. Pt with history of SLP services in 2023 following CVA addressing dysarthria.   Pt seen for screen this date. Pt denied acute cognitive concerns, reporting feeling to be at his baseline for speech and cognition. Attention, orientation, and awareness intact for questioning. Speech 100% intelligible. Family not present. RN denying concerns. Based on reported baseline function and presentation during SLP screen, no further acute SLP services indicated at this time. If acute concerns arise please re-consult.   [place] Jayveion Stalling Clapp, MS, CCC-SLP Speech Language Pathologist Rehab Services; [facility] [phone] (ascom)   [place] J Clapp 09/15/2023, 10:20 AM

## 2023-09-15 NOTE — Care Management CC44 (Signed)
 Condition Code 44 Documentation Completed  Patient Details  Name: Vernon Payne. MRN: [id number] Date of Birth: 1950/09/21   Condition Code 44 given:  Yes Patient signature on Condition Code 44 notice:  Yes Documentation of 2 MD's agreement:  Yes Code 44 added to claim:  Yes    [place] L [facility], LCSW 09/15/2023, 11:27 AM

## 2023-09-15 NOTE — Discharge Summary (Signed)
 Physician Discharge Summary   Patient: Vernon Payne. MRN: [id number] DOB: 08-25-1950  Admit date:     09/14/2023  Discharge date: 09/15/23  Discharge Physician: Charlie Patterson   PCP: Cleotilde Oneil FALCON, MD   Recommendations at discharge:   Follow-up PCP 5 days Outpatient EEG recommended Follow-up Dr. Maree neurology 4 weeks Follow-up with Dr. Clois neurosurgery 3 weeks  Discharge Diagnoses: Principal Problem:   SDH (subdural hematoma) (HCC) Active Problems:   SAH (subarachnoid hemorrhage) (HCC)   Fall   Focal seizure (HCC)   HTN (hypertension)   HLD (hyperlipidemia)   Type 2 diabetes mellitus with peripheral neuropathy (HCC)   Depression  Resolved Problems:   * No resolved hospital problems. [facility] Course: 73 y.o. adult with medical history significant of stroke 2023 with residual mild imbalance, HTN, HLD, DM, GERD, depression, hard of hearing, skin cancer, IBS, peripheral neuropathy, OSA not using CPAP, who presents with right lower leg numbness and difficulty walking.   Per pt and his wife at the bedside, they were walking in the store when he suddenly started having difficulty walking, feeling numb in his right lower leg and foot around 14:30 today. Per report, pt was noted to have a difficult time walking from CT scanner to wheelchair in ED. When I saw pt in ED, his symptoms have resolved.  Currently no unilateral numbness or weakness in extremities.  No facial droop or slurred speech.  Patient has a chronic hearing loss which has not changed.  No difficulty swallowing or vision loss.  Patient denies chest pain, cough, SOB.  No nausea, vomiting, diarrhea or abdominal pain.  No symptoms of UTI.    Of note, patient states that he had a fall last week in which he fell face down hitting head on driveway. He had headache which has resolved.  He did not pay attention.   Data reviewed independently and ED Course: pt was found to have WBC 8.1, GFR> 60, INR 1.0, PTT 30,  alcohol level less than 15, temperature normal, blood pressure 171/91, heart rate 98 --> 71, RR 17, oxygen saturation 99% on room air.  Patient is admitted to stepdown as inpatient.  Dr. Jerrie of neurology and Dr. Katrina of neurosurgery were consulted by EDP.   CT of head at 15:25  1. Acute left parafalcine subdural hematoma and trace subarachnoid hemorrhage.   2.  Traumatic Brain Injury Risk Stratification   Skull Fracture: No - Low/mBIG 1   Subdural Hematoma (SDH): Yes   Subarachnoid Hemorrhage [facility]): Yes   Epidural Hematoma (EDH): No - Low/mBIG 1   Cerebral contusion, intra-axial, intraparenchymal Hemorrhage (IPH): No   Intraventricular Hemorrhage (IVH): No - Low/mBIG 1   Midline Shift > 1mm or Edema/effacement of sulci/vents: No - Low/mBIG 1   3. ASPECTS is 10.     CTA of head and neck: 1. 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 2. He centrally negative CT angiogram of the neck.     CT venogram: 1. Normal CT venogram. 2. Stable left parafalcine subdural hematoma.  7/26.  Patient feeling better and wanting to go home.  MRI of the brain reviewed showed similar size of left parafalcine subdural hemorrhage.  Case discussed with neurosurgery and neurology and patient can be discharged home.  Neurosurgery will reevaluate as an outpatient with a repeat scan.  Continue to hold Plavix  for another week.  Neurology recommends an outpatient EEG and lifelong Keppra .  Assessment and Plan: * SDH (subdural hematoma) (HCC) Traumatic subdural hematoma seen on imaging.  Neurosurgery and neurology cleared to go home.  Did well with physical therapy.  Continue to hold Plavix  for another 7 days.  Follow-up with neurosurgery back in 3 weeks and a repeat CT scan in about 4 weeks.  SAH (subarachnoid hemorrhage) (HCC) Seen on CT scan but not on MRI.  Focal seizure [facility]) Neurology recommended Keppra  500 mg twice a day indefinitely.   Recommended outpatient EEG.  No driving.  HTN (hypertension) Norvasc  started.  HLD (hyperlipidemia) Continue Zocor   Type 2 diabetes mellitus with peripheral neuropathy (HCC) Patient was on sliding scale while here in the hospital can go back on glipizide  and metformin  as outpatient  Depression Continue Paxil          Consultants: Neurosurgery, neurology Procedures performed: None Disposition: Home Diet recommendation:  Cardiac and Carb modified diet DISCHARGE MEDICATION: Allergies as of 09/15/2023       Reactions   Fluorouracil  Rash   Morphine And Codeine Nausea And Vomiting, Other (See Comments)   Confusion        Medication List     STOP taking these medications    Baclofen 5 MG Tabs   clopidogrel  75 MG tablet Commonly known as: PLAVIX    diphenhydramine -acetaminophen  25-500 MG Tabs tablet Commonly known as: TYLENOL  PM   doxycycline  100 MG capsule Commonly known as: VIBRAMYCIN        TAKE these medications    amLODipine  5 MG tablet Commonly known as: NORVASC  Take 1 tablet (5 mg total) by mouth daily.   calcium  elemental as carbonate 400 MG chewable tablet Commonly known as: BARIATRIC TUMS ULTRA Chew 1,200 mg by mouth daily.   clobetasol  ointment 0.05 % Commonly known as: TEMOVATE  Apply 1 Application topically 2 (two) times daily. Apply to affected area until inflammation resolves   cyanocobalamin  1000 MCG tablet Commonly known as: VITAMIN B12 Take 1,000 mcg by mouth daily.   dorzolamide -timolol  2-0.5 % ophthalmic solution Commonly known as: COSOPT  Place 1 drop into both eyes 2 (two) times daily.   glipiZIDE  5 MG 24 hr tablet Commonly known as: GLUCOTROL  XL Take 5 mg by mouth.  Take 1 tablet (5 mg total) by mouth once daily   latanoprost  0.005 % ophthalmic solution Commonly known as: XALATAN  Place 1 drop into both eyes at bedtime.   levETIRAcetam  500 MG tablet Commonly known as: KEPPRA  Take 1 tablet (500 mg total) by mouth 2 (two)  times daily.   metFORMIN  500 MG tablet Commonly known as: GLUCOPHAGE  Take 1,000 mg by mouth 2 (two) times daily with a meal.   pantoprazole  40 MG tablet Commonly known as: PROTONIX  Take 40 mg by mouth daily.   PARoxetine  20 MG tablet Commonly known as: PAXIL  Take 20 mg by mouth daily.   simvastatin  20 MG tablet Commonly known as: ZOCOR  Take 20 mg by mouth at bedtime.   SSD 1 % cream Generic drug: silver sulfADIAZINE Apply topically 2 (two) times daily. Topical Twice Daily        Follow-up Information     Cleotilde Oneil FALCON, MD Follow up in 5 day(s).   Specialty: Internal Medicine Contact information: [phone] [facility] [address] [facility] [place] [address] [phone]         Maree Jannett POUR, MD Follow up in 4 week(s).   Specialty: Neurology Contact information: [phone] [facility] [address] [facility] [place] [address] [phone]         Clois,  Reeves, MD Follow up in 3 week(s).   Specialty: Neurosurgery Contact information: [address] [place] [address] [phone]                Discharge Exam: Fredricka Weights   09/14/23 1522 09/14/23 2038  Weight: 86.6 kg 88.9 kg   Physical Exam HENT:     Head: Normocephalic.     Mouth/Throat:     Pharynx: No oropharyngeal exudate.  Eyes:     General: Lids are normal.     Conjunctiva/sclera: Conjunctivae normal.  Cardiovascular:     Rate and Rhythm: Normal rate and regular rhythm.     Heart sounds: Normal heart sounds, S1 normal and S2 normal.  Pulmonary:     Breath sounds: No decreased breath sounds, wheezing, rhonchi or rales.  Abdominal:     Palpations: Abdomen is soft.     Tenderness: There is no abdominal tenderness.  Musculoskeletal:     Right lower leg: No swelling.     Left lower leg: No swelling.  Skin:    General: Skin is warm.     Findings: No rash.  Neurological:     Mental Status: She is alert.      Comments: Patient able to move all extremities.      Condition at discharge: stable  The results of significant diagnostics from this hospitalization (including imaging, microbiology, ancillary and laboratory) are listed below for reference.   Imaging Studies: MR BRAIN W WO CONTRAST Result Date: 09/15/2023 CLINICAL DATA:  Subdural hemorrhage recent concern for tick-borne illness, rule out meningitis EXAM: MRI HEAD WITHOUT AND WITH CONTRAST TECHNIQUE: Multiplanar, multiecho pulse sequences of the brain and surrounding structures were obtained without and with intravenous contrast. CONTRAST:  9mL GADAVIST  GADOBUTROL  1 MMOL/ML IV SOLN COMPARISON:  CT head 09/14/2023. FINDINGS: Brain: No evidence of acute infarct, mass lesion, midline shift or hydrocephalus. When comparing across modalities, similar left parafalcine subdural hemorrhage which demonstrates some enhancement, susceptibility artifact and FLAIR hyperintensity. Outside of this hemorrhage, there is no additional sulcal FLAIR hyperintensity or enhancement to suggest meningitis/encephalitis. Vascular: Major arterial flow voids are maintained at the skull base. Skull and upper cervical spine: Normal marrow signal. Sinuses/Orbits: Clear sinuses.  No acute orbital findings. IMPRESSION: 1. When comparing across modalities, similar size of a left parafalcine subdural hemorrhage. 2. No specific findings to suggest meningitis; however, please note that lumbar puncture could provide more sensitive evaluation if clinically warranted. Electronically Signed   By: Gilmore GORMAN Molt M.D.   On: 09/15/2023 02:35   CT VENOGRAM HEAD Result Date: 09/14/2023 CLINICAL DATA:  Subarachnoid hemorrhage [facility]) EXAM: CT VENOGRAM HEAD TECHNIQUE: Venographic phase images of the brain were obtained following the administration of intravenous contrast. Multiplanar reformats and maximum intensity projections were generated. RADIATION DOSE REDUCTION: This exam was performed  according to the departmental dose-optimization program which includes automated exposure control, adjustment of the mA and/or kV according to patient size and/or use of iterative reconstruction technique. CONTRAST:  75mL OMNIPAQUE  IOHEXOL  350 MG/ML SOLN COMPARISON:  CT of the head dated September 14, 2023. FINDINGS: The venous sinuses are patent. The right transverse sinus is dominant. There is no evidence of venous thrombosis or arteriovenous fistula. A left parafalcine subdural hematoma is again demonstrated which appears unchanged. IMPRESSION: 1. Normal CT venogram. 2. Stable left parafalcine subdural hematoma. Electronically Signed   By: Evalene Coho M.D.   On: 09/14/2023 16:51   CT ANGIO HEAD NECK W WO CM Result Date: 09/14/2023 CLINICAL DATA:  Subdural hemorrhage EXAM: CT ANGIOGRAPHY HEAD AND NECK WITH CONTRAST TECHNIQUE: Multidetector CT imaging of the head and neck was performed using the standard protocol during bolus administration of intravenous contrast. Multiplanar CT image reconstructions and MIPs were obtained to evaluate the vascular anatomy. Carotid stenosis measurements (when applicable) are obtained utilizing NASCET criteria, using the distal internal carotid diameter as the denominator. RADIATION DOSE REDUCTION: This exam was performed according to the departmental dose-optimization program which includes automated exposure control, adjustment of the mA and/or kV according to patient size and/or use of iterative reconstruction technique. CONTRAST:  75mL OMNIPAQUE  IOHEXOL  350 MG/ML SOLN COMPARISON:  CT the head dated September 14, 2023. FINDINGS: CTA NECK FINDINGS Aortic arch: Mild calcific atheromatous disease. Normal caliber. Common origin of the brachiocephalic artery and left common carotid artery. The brachiocephalic artery is normal in caliber. Right carotid system: The common carotid artery is normal in caliber and unremarkable in appearance. There is mild calcific plaque present within the  proximal internal carotid artery, but no associated stenosis. Left carotid system: The common carotid artery demonstrates mild calcific atheromatous disease but no appreciable stenosis. There is mild calcific plaque also present within the proximal internal carotid artery, with no appreciable stenosis. The remainder of the cervical segment is normal in caliber. Vertebral arteries: The vertebral arteries are patent throughout the respective courses. The left vertebral artery is slightly dominant. Skeleton: Mild to moderate degenerative changes within the cervical spine. No osseous lesions. Other neck: Negative. Upper chest: Clear lung apices. Review of the MIP images confirms the above findings CTA HEAD FINDINGS Anterior circulation: Mild calcific plaque within the carotid siphons. No significant luminal stenosis. The anterior and middle cerebral arteries and their branches are normal in caliber. No evidence of aneurysm, large vessel occlusion or flow-limiting stenosis. Posterior circulation: The vertebrobasilar system is unremarkable. There is moderate to severe stenosis of the P1 and P2 segments of the right posterior cerebral artery. The left posterior cerebral artery is normal in caliber. The cerebellar arteries are patent bilaterally. Venous sinuses: Suboptimally opacified, but they appear to be patent. The right transverse sinus is dominant. There is no cortical venous thrombosis evident. There is no evidence of arteriovenous fistula. Anatomic variants: None. Review of the MIP images confirms the above findings IMPRESSION: 1. 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 2. He centrally negative CT angiogram of the neck. These results were called by telephone at the time of interpretation on 09/14/2023 at 4:24 pm to provider Dr. LOLA JERNIGAN , who verbally acknowledged these results. Electronically Signed   By: Evalene Coho M.D.   On: 09/14/2023 16:47   CT  HEAD CODE STROKE WO CONTRAST Result Date: 09/14/2023 CLINICAL DATA:  Code stroke. Acute neurological deficit with right leg numbness. EXAM: CT HEAD WITHOUT CONTRAST TECHNIQUE: Contiguous axial images were obtained from the base of the skull through the vertex without intravenous contrast. RADIATION DOSE REDUCTION: This exam was performed according to the departmental dose-optimization program which includes automated exposure control, adjustment of the mA and/or kV according to patient size and/or use of iterative reconstruction technique. COMPARISON:  MRI of the head dated July 31, 2023. FINDINGS: Brain: There is an acute left parafalcine subdural hematoma present posteriorly, which measures up to 7-8 mm in maximal thickness. There appears to be trace subarachnoid hemorrhage within a sulcus of the left anterior frontal lobe on image 16 of series 4. There is no intraparenchymal hemorrhage present. There is age related  cerebral volume loss and moderate periventricular and deep cerebral white matter disease. Vascular: Moderate calcific atheromatous disease within the carotid siphons. Skull: Intact and unremarkable. Sinuses/Orbits: Clear paranasal sinuses. Status post bilateral lens replacement. Other: None. ASPECTS [facility] Stroke Program Early CT Score) - Ganglionic level infarction (caudate, lentiform nuclei, internal capsule, insula, M1-M3 cortex): 7. - Supraganglionic infarction (M4-M6 cortex): 3. Total score (0-10 with 10 being normal): 10. IMPRESSION: 1. Acute left parafalcine subdural hematoma and trace subarachnoid hemorrhage. 2.  Traumatic Brain Injury Risk Stratification Skull Fracture: No - Low/mBIG 1 Subdural Hematoma (SDH): Yes Subarachnoid Hemorrhage [facility]): Yes Epidural Hematoma (EDH): No - Low/mBIG 1 Cerebral contusion, intra-axial, intraparenchymal Hemorrhage (IPH): No Intraventricular Hemorrhage (IVH): No - Low/mBIG 1 Midline Shift > 1mm or Edema/effacement of sulci/vents: No - Low/mBIG 1  ---------------------------------------------------- 3. ASPECTS is 10. 4. These results were communicated to Dr. Jerrie at 3:36 pm on 09/14/2023 by text page via the [facility] messaging system. Electronically Signed   By: Evalene Coho M.D.   On: 09/14/2023 15:44    Microbiology: Results for orders placed or performed during the hospital encounter of 04/24/23  Surgical pcr screen     Status: None   Collection Time: 04/24/23  2:31 PM   Specimen: Nasal Mucosa; Nasal Swab  Result Value Ref Range Status   MRSA, PCR NEGATIVE NEGATIVE Final   Staphylococcus aureus NEGATIVE NEGATIVE Final    Comment: (NOTE) The Xpert SA Assay (FDA approved for NASAL specimens in patients 59 years of age and older), is one component of a comprehensive surveillance program. It is not intended to diagnose infection nor to guide or monitor treatment. Performed at [facility], [address] [place], [place] [address]     Labs: CBC: Recent Labs  Lab 09/14/23 1531  WBC 8.1  NEUTROABS 5.5  HGB 10.6*  HCT 31.9*  MCV 87.9  PLT 319   Basic Metabolic Panel: Recent Labs  Lab 09/14/23 1531  NA 139  K 4.3  CL 104  CO2 25  GLUCOSE 318*  BUN 13  CREATININE 1.00  CALCIUM  8.7*   Liver Function Tests: Recent Labs  Lab 09/14/23 1531  AST 16  ALT 11  ALKPHOS 78  BILITOT 0.6  PROT 6.0*  ALBUMIN 3.4*   CBG: Recent Labs  Lab 09/14/23 1522 09/14/23 2037 09/15/23 0829 09/15/23 1133  GLUCAP 303* 293* 181* 217*    Discharge time spent: greater than 30 minutes.  Signed: Charlie Patterson, MD [facility] 09/15/2023

## 2023-09-15 NOTE — Assessment & Plan Note (Signed)
 Norvasc started

## 2023-09-15 NOTE — Evaluation (Signed)
 Occupational Therapy Evaluation Patient Details Name: Vernon Payne. MRN: [id number] DOB: 06/03/1950 Today's Date: 09/15/2023   History of Present Illness   Pt is a 73 year old male presents with right lower leg numbness and difficulty walking, CT scan showed acute cute left parafalcine subdural hematoma and trace subarachnoid hemorrhage, without midline shift.  Patient symptoms have resolved.Per neuro Possible focal seizure:     PMH significant for left corona radiata stroke with residual mild imbalance (06/2021, likely due to small vessel disease), hypertension, hyperlipidemia, diabetes, OSA on CPAP, bilateral knee replacements (right knee most recently in 06/30/2023).     Clinical Impressions Chart reviewed to date, pt greeted semi supine in bed, agreeable to OT evaluation. Pt is oriented to self, place, month/year and to situation. He has ?awareness of current level of deficits but reports who to call in an emergency, provides appropriate PLOF per chart reviewed and follows directions appropriately. PTA pt is MOD I with ADL (sits in the shower), has assist for IADL as needed. Pt presents with deficits in strength, activity tolerance, balance, ?cognition affecting safe and optimal ADL completion. He performs bed mobility with MOD I, STS with supervision, amb with RW with supervision, CGA with no AD. Seated grooming tasks with set up-supervision. This therapist spoke to his wife on the phone and she reports she helps him with IADLs including med management. Pt is left as received, all needs met. OT will follow acutely to facilitate optimal ADL/functional mobility performance.      If plan is discharge home, recommend the following:   Direct supervision/assist for medications management;Direct supervision/assist for financial management;Assist for transportation;A little help with bathing/dressing/bathroom     Functional Status Assessment   Patient has had a recent decline in their  functional status and demonstrates the ability to make significant improvements in function in a reasonable and predictable amount of time.     Equipment Recommendations   None recommended by OT (pt has recommended equipment)     Recommendations for Other Services         Precautions/Restrictions   Precautions Precautions: Fall Recall of Precautions/Restrictions: Intact Restrictions Weight Bearing Restrictions Per Provider Order: No     Mobility Bed Mobility Overal bed mobility: Modified Independent                  Transfers Overall transfer level: Needs assistance   Transfers: Sit to/from Stand Sit to Stand: Supervision                  Balance Overall balance assessment: Needs assistance Sitting-balance support: Feet supported Sitting balance-Leahy Scale: Good     Standing balance support: During functional activity, Reliant on assistive device for balance, No upper extremity supported Standing balance-Leahy Scale: Fair Standing balance comment: pt reports veering to the L while amb with no AD                           ADL either performed or assessed with clinical judgement   ADL Overall ADL's : Needs assistance/impaired Eating/Feeding: Set up   Grooming: Supervision/safety           Upper Body Dressing : Supervision/safety   Lower Body Dressing: Supervision/safety   Toilet Transfer: Supervision/safety;Ambulation;Rolling walker (2 wheels) Toilet Transfer Details (indicate cue type and reason): simulated, CGA with RW         Functional mobility during ADLs: Supervision/safety;Contact guard assist (CGA without AD, supervision with RW approx 200')  Vision Patient Visual Report: No change from baseline       Perception         Praxis         Pertinent Vitals/Pain Pain Assessment Pain Assessment: Faces Faces Pain Scale: Hurts a little bit Pain Location: scab on L knee Pain Descriptors / Indicators:  Discomfort Pain Intervention(s): Monitored during session     Extremity/Trunk Assessment Upper Extremity Assessment Upper Extremity Assessment: Overall WFL for tasks assessed   Lower Extremity Assessment Lower Extremity Assessment: Defer to PT evaluation       Communication Communication Communication: Impaired Factors Affecting Communication: Hearing impaired   Cognition Arousal: Alert Behavior During Therapy: WFL for tasks assessed/performed Cognition: No apparent impairments, Cognition impaired           Executive functioning impairment (select all impairments): Problem solving OT - Cognition Comments: Pt follows directives appropriately; STM appears intact, he reports increased difficulites remembering things but not worse since fall; recommended outpatient speech to wife if cognition appears worse than normal to her but he appears to be at his baseline per hers/his report                 Following commands: Intact       Cueing  General Comments   Cueing Techniques: Verbal cues  vss throughout, HR to 103 bpm while amb without RW   Exercises Other Exercises Other Exercises: edu pt during eval, then wife on the phone re: role of OT, role of rehab, discharge recommendations   Shoulder Instructions      Home Living Family/patient expects to be discharged to:: Private residence Living Arrangements: Spouse/significant other Available Help at Discharge: Family;Available PRN/intermittently (wife is retired and will assist as needed) Type of Home: House Home Access: Stairs to enter;Ramped entrance Entrance Stairs-Number of Steps: 6 Entrance Stairs-Rails: None Home Layout: Two level;Able to live on main level with bedroom/bathroom     Bathroom Shower/Tub: Walk-in shower         Home Equipment: Agricultural consultant (2 wheels);Cane - single point;Shower seat          Prior Functioning/Environment Prior Level of Function : Independent/Modified  Independent;History of Falls (last six months)             Mobility Comments: amb with no AD; fall history ADLs Comments: MOD I for ADL, wife assists with IADLs such as med management, driving (though he did drive a few times after knee sx- when he started falling he stopped)    OT Problem List: Decreased strength;Decreased activity tolerance;Decreased knowledge of use of DME or AE;Decreased safety awareness;Impaired balance (sitting and/or standing)   OT Treatment/Interventions: Self-care/ADL training;Patient/family education;Therapeutic exercise;Balance training;Energy conservation;Therapeutic activities;DME and/or AE instruction      OT Goals(Current goals can be found in the care plan section)   Acute Rehab OT Goals Patient Stated Goal: go home OT Goal Formulation: With patient Time For Goal Achievement: 09/29/23 Potential to Achieve Goals: Good ADL Goals Pt Will Perform Grooming: with modified independence;sitting;standing Pt Will Perform Lower Body Dressing: with modified independence;sitting/lateral leans;sit to/from stand Pt Will Transfer to Toilet: with modified independence;ambulating Pt Will Perform Toileting - Clothing Manipulation and hygiene: with modified independence;sitting/lateral leans;sit to/from stand   OT Frequency:  Min 2X/week    Co-evaluation PT/OT/SLP Co-Evaluation/Treatment: Yes Reason for Co-Treatment: To address functional/ADL transfers   OT goals addressed during session: ADL's and self-care      AM-PAC OT 6 Clicks Daily Activity     Outcome Measure Help from  another person eating meals?: None Help from another person taking care of personal grooming?: None Help from another person toileting, which includes using toliet, bedpan, or urinal?: None Help from another person bathing (including washing, rinsing, drying)?: A Little Help from another person to put on and taking off regular upper body clothing?: None Help from another person to  put on and taking off regular lower body clothing?: None 6 Click Score: 23   End of Session Equipment Utilized During Treatment: Rolling walker (2 wheels) Nurse Communication: Mobility status  Activity Tolerance: Patient tolerated treatment well Patient left: in bed;with call bell/phone within reach  OT Visit Diagnosis: Other abnormalities of gait and mobility (R26.89);Other symptoms and signs involving cognitive function                Time: 8962-8943 OT Time Calculation (min): 19 min Charges:  OT General Charges $OT Visit: 1 Visit OT Evaluation $OT Eval Moderate Complexity: 1 Mod  Therisa Sheffield, OTD OTR/L  09/15/23, 11:30 AM

## 2023-09-15 NOTE — Assessment & Plan Note (Addendum)
 Neurology recommended Keppra  500 mg twice a day indefinitely.  Recommended outpatient EEG.  No driving.

## 2023-09-15 NOTE — Discharge Instructions (Addendum)
 Based on imaging, we believe you had bleeding in your brain ~7/16 when you fell. Please always come to ED for evaluation after a fall if you are taking Plavix .   Your episode of leg weakness 7/25 is likely due to a focal seizure from that bleeding  To control seizures, your medications have been adjusted as follows:  - Start Keppra  500 mg twice daily  You may restart Plavix  as per neurosurgery in 8 days  Please keep track of any other spells you have, including episodes of R leg weakness/numbness, shaking spells, loss of consciousness events, staring spells etc to review with your neurologist   Spell log:  - Date and time of event: - Description of event:  - Prodome (any warning / premonition event is going to happen): - Post-spell symptoms: - Any potential triggers:   Standard seizure precautions: Per [place]  DMV statutes, patients with seizures are not allowed to drive until  they have been seizure-free for six months. Use caution when using heavy equipment or power tools. Avoid working on ladders or at heights. Take showers instead of baths. Ensure the water temperature is not too high on the home water heater. Do not go swimming alone. When caring for infants or small children, sit down when holding, feeding, or changing them to minimize risk of injury to the child in the event you have a seizure.  To reduce risk of seizures, maintain good sleep hygiene avoid alcohol and illicit drug use, take all anti-seizure medications as prescribed.

## 2023-09-15 NOTE — Progress Notes (Signed)
 NEUROLOGY CONSULT FOLLOW UP NOTE   Date of service: September 15, 2023 Patient Name: Vernon Payne. MRN:  [id number] DOB:  1950/10/18  Interval Hx/subjective   - Gait back to normal - No further episodes of RLE sensory / motor abnormalities  - Tolerating keppra  without side effects  Vitals   Vitals:   09/15/23 0600 09/15/23 0700 09/15/23 0705 09/15/23 0806  BP: 137/78  135/77 (!) 143/89  Pulse: 69 70 70 63  Resp: 18 13 14 18   Temp:    97.9 F (36.6 C)  TempSrc:    Oral  SpO2: 97% 97% 96% 96%  Weight:      Height:         Body mass index is 23.87 kg/m.  Physical Exam   Constitutional: Appears well-developed and well-nourished.  Psych: Affect appropriate to situation. Pleasant, joking Eyes: No scleral injection.  HENT: No OP obstruction.  Respiratory: Effort normal, non-labored breathing.  GI: Soft.  No distension. There is no tenderness.   Neurologic Examination   Alert, oriented to person/place/situation. Remembers some of conversation but did not remember name of medication started, recalled driving restrictions  PERRL, Face symmetric, EMOI, stable hearing loss L > R ear   Using all extremities equally, sits up with dizziness or truncal ataxia, stands with steady stance, mild imbalance which is his baseline ambulation and improved from evaluation yesterday  Medications  Current Facility-Administered Medications:     stroke: early stages of recovery book, , Does not apply, Once, Niu, Xilin, MD   acetaminophen  (TYLENOL ) tablet 650 mg, 650 mg, Oral, Q4H PRN **OR** acetaminophen  (TYLENOL ) 160 MG/5ML solution 650 mg, 650 mg, Per Tube, Q4H PRN **OR** acetaminophen  (TYLENOL ) suppository 650 mg, 650 mg, Rectal, Q4H PRN, Niu, Xilin, MD   calcium  carbonate (TUMS - dosed in mg elemental calcium ) chewable tablet 400 mg of elemental calcium , 1,000 mg, Oral, Daily, Niu, Xilin, MD   Chlorhexidine  Gluconate Cloth 2 % PADS 6 each, 6 each, Topical, Q0600, Niu, Xilin, MD, 6 each at  09/14/23 2038   cyanocobalamin  (VITAMIN B12) tablet 1,000 mcg, 1,000 mcg, Oral, Daily, Niu, Xilin, MD   diazepam  (VALIUM ) injection 2.5 mg, 2.5 mg, Intravenous, Once, Jesus America, NP   insulin  aspart (novoLOG ) injection 0-5 Units, 0-5 Units, Subcutaneous, QHS, Niu, Xilin, MD, 3 Units at 09/14/23 2101   insulin  aspart (novoLOG ) injection 0-9 Units, 0-9 Units, Subcutaneous, TID WC, Niu, Xilin, MD   latanoprost  (XALATAN ) 0.005 % ophthalmic solution 1 drop, 1 drop, Both Eyes, QHS, Niu, Xilin, MD   levETIRAcetam  (KEPPRA ) tablet 500 mg, 500 mg, Oral, BID, Felix Meras L, MD, 500 mg at 09/14/23 2101   LORazepam  (ATIVAN ) injection 2 mg, 2 mg, Intravenous, Q2H PRN, Niu, Xilin, MD   nicardipine  (CARDENE ) 20mg  in 0.86% saline IV infusion (0.1 mg/ml), 0-15 mg/hr, Intravenous, Continuous, Niu, Xilin, MD, Stopped at 09/14/23 2222   pantoprazole  (PROTONIX ) injection 40 mg, 40 mg, Intravenous, QHS, Niu, Xilin, MD, 40 mg at 09/14/23 2101   PARoxetine  (PAXIL ) tablet 20 mg, 20 mg, Oral, Daily, Niu, Xilin, MD   senna-docusate (Senokot-S) tablet 1 tablet, 1 tablet, Oral, BID, Niu, Xilin, MD, 1 tablet at 09/14/23 2101   simvastatin  (ZOCOR ) tablet 20 mg, 20 mg, Oral, QHS, Niu, Xilin, MD, 20 mg at 09/14/23 2101   sodium chloride  flush (NS) 0.9 % injection 3 mL, 3 mL, Intravenous, Once, Willo Dunnings, MD  Labs and Diagnostic Imaging   CBC:  Recent Labs  Lab 09/14/23 1531  WBC 8.1  NEUTROABS 5.5  HGB 10.6*  HCT 31.9*  MCV 87.9  PLT 319    Basic Metabolic Panel:  Lab Results  Component Value Date   NA 139 09/14/2023   K 4.3 09/14/2023   CO2 25 09/14/2023   GLUCOSE 318 (H) 09/14/2023   BUN 13 09/14/2023   CREATININE 1.00 09/14/2023   CALCIUM  8.7 (L) 09/14/2023   GFRNONAA >60 09/14/2023   GFRAA >60 03/27/2019   Lipid Panel:  Lab Results  Component Value Date   LDLCALC 53 07/20/2021   HgbA1c:  Lab Results  Component Value Date   HGBA1C 7.0 (H) 04/24/2023   Urine Drug Screen: No  results found for: LABOPIA, COCAINSCRNUR, LABBENZ, AMPHETMU, THCU, LABBARB  Alcohol Level     Component Value Date/Time   [facility] <15 09/14/2023 1531   INR  Lab Results  Component Value Date   INR 1.0 09/14/2023   APTT  Lab Results  Component Value Date   APTT 30 09/14/2023   AED levels: No results found for: PHENYTOIN, ZONISAMIDE, LAMOTRIGINE, LEVETIRACETA  CT Head without contrast(Personally reviewed): 1. Acute left parafalcine subdural hematoma and trace subarachnoid hemorrhage.   CT angio Head and Neck with contrast(Personally reviewed): 1. 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 2. [Essentially] negative CT angiogram of the neck.   CT Venogram 1. Normal CT venogram. 2. Stable left parafalcine subdural hematoma.  MRI Brain(Personally reviewed): [Late subacute appearance of blood on my review of T1 and T2 which both appear bright, suggesting > 7 days since initial hemorrhage) 1. When comparing across modalities, similar size of a left parafalcine subdural hemorrhage. 2. No specific findings to suggest meningitis; however, please note that lumbar puncture could provide more sensitive evaluation if clinically warranted.   Assessment   Teofilo Lupinacci. is a 73 y.o. adult presenting with transient right leg weakness.  He had a recent fall 7/16 and his imaging is most consistent with a traumatic subarachnoid hemorrhage; based on MRI imaging do suspect it happened with that most recent fall 7/16.  However he did not have symptoms of leg weakness until 7/25 while walking, favor this to represent a small focal seizure given transient rapidly improving symptoms.   I do not feel the recent concern for tick borne illness is playing a significant role at this time given his improvement prior to most recent fall and reassuring clinical exam  Recommendations  - Keppra  500 mg BID indefinitely - Hold  antiplatelets until cleared by neurosurgery to resume - Strict blood pressure control systolic less than 150, unless other recommendations by neurosurgery - Seizure precautions, no driving for at least 6 months or until cleared by outpatient neurologist  - Neurosurgical consultation for further recommendations and management of his ICH - Inpatient neurology will sign off at this time; discharge instructions update please do not hesitate to reach out with any questions or concerns, including any further episodes of RLE weakness or seizure activity as would then adjust antiseizure medications ______________________________________________________________________  Lola Jernigan MD-PhD [facility] [phone] [facility] coverage for [facility] is from 8 AM to 4 AM in-house and 4 PM to 8 PM by telephone/video. 8 PM to 8 AM emergent questions or overnight urgent questions should be addressed to Teleneurology On-call or Jolynn Pack neurohospitalist; contact information can be found on AMION

## 2023-09-15 NOTE — Hospital Course (Signed)
 73 y.o. adult with medical history significant of stroke 2023 with residual mild imbalance, HTN, HLD, DM, GERD, depression, hard of hearing, skin cancer, IBS, peripheral neuropathy, OSA not using CPAP, who presents with right lower leg numbness and difficulty walking.   Per pt and his wife at the bedside, they were walking in the store when he suddenly started having difficulty walking, feeling numb in his right lower leg and foot around 14:30 today. Per report, pt was noted to have a difficult time walking from CT scanner to wheelchair in ED. When I saw pt in ED, his symptoms have resolved.  Currently no unilateral numbness or weakness in extremities.  No facial droop or slurred speech.  Patient has a chronic hearing loss which has not changed.  No difficulty swallowing or vision loss.  Patient denies chest pain, cough, SOB.  No nausea, vomiting, diarrhea or abdominal pain.  No symptoms of UTI.    Of note, patient states that he had a fall last week in which he fell face down hitting head on driveway. He had headache which has resolved.  He did not pay attention.   Data reviewed independently and ED Course: pt was found to have WBC 8.1, GFR> 60, INR 1.0, PTT 30, alcohol level less than 15, temperature normal, blood pressure 171/91, heart rate 98 --> 71, RR 17, oxygen saturation 99% on room air.  Patient is admitted to stepdown as inpatient.  Dr. Jerrie of neurology and Dr. Katrina of neurosurgery were consulted by EDP.   CT of head at 15:25  1. Acute left parafalcine subdural hematoma and trace subarachnoid hemorrhage.   2.  Traumatic Brain Injury Risk Stratification   Skull Fracture: No - Low/mBIG 1   Subdural Hematoma (SDH): Yes   Subarachnoid Hemorrhage [facility]): Yes   Epidural Hematoma (EDH): No - Low/mBIG 1   Cerebral contusion, intra-axial, intraparenchymal Hemorrhage (IPH): No   Intraventricular Hemorrhage (IVH): No - Low/mBIG 1   Midline Shift > 1mm or Edema/effacement of  sulci/vents: No - Low/mBIG 1   3. ASPECTS is 10.     CTA of head and neck: 1. 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 2. He centrally negative CT angiogram of the neck.     CT venogram: 1. Normal CT venogram. 2. Stable left parafalcine subdural hematoma.  7/26.  Patient feeling better and wanting to go home.  MRI of the brain reviewed showed similar size of left parafalcine subdural hemorrhage.  Case discussed with neurosurgery and neurology and patient can be discharged home.  Neurosurgery will reevaluate as an outpatient with a repeat scan.  Continue to hold Plavix  for another week.  Neurology recommends an outpatient EEG and lifelong Keppra .

## 2023-09-15 NOTE — Assessment & Plan Note (Signed)
 Seen on CT scan but not on MRI.

## 2023-09-15 NOTE — Evaluation (Signed)
 Physical Therapy Evaluation Patient Details Name: Vernon Payne. MRN: [id number] DOB: 27-Jan-1951 Today's Date: 09/15/2023  History of Present Illness  Pt is a 73 year old male presents with right lower leg numbness and difficulty walking, CT scan showed acute cute left parafalcine subdural hematoma and trace subarachnoid hemorrhage, without midline shift.  Patient symptoms have resolved.Per neuro Possible focal seizure:     PMH significant for left corona radiata stroke with residual mild imbalance (06/2021, likely due to small vessel disease), hypertension, hyperlipidemia, diabetes, OSA on CPAP, bilateral knee replacements (right knee most recently in 06/30/2023).  Clinical Impression  Co-evaluation performed with OT this date. Pt is a pleasant 73 year old male who was admitted for SDH. Pt demonstrates all bed mobility/transfers/ambulation at baseline level. Encouraged due to fall risk and fall history, RW to be used at this time and continue follow up with established OP PT. Vitals monitored throughout exertion, no reports of dizziness/headache with exertion. Sensation/coordination intact in B LE with excellent ROM in R knee. Pt does not require any further PT needs at this time. Pt will be dc in house and does not require follow up. RN aware. Will dc current orders.       If plan is discharge home, recommend the following: A little help with walking and/or transfers   Can travel by private vehicle        Equipment Recommendations None recommended by PT  Recommendations for Other Services       Functional Status Assessment Patient has had a recent decline in their functional status and demonstrates the ability to make significant improvements in function in a reasonable and predictable amount of time.     Precautions / Restrictions Precautions Precautions: Fall Recall of Precautions/Restrictions: Intact Restrictions Weight Bearing Restrictions Per Provider Order: No       Mobility  Bed Mobility Overal bed mobility: Modified Independent                  Transfers Overall transfer level: Needs assistance Equipment used: Rolling walker (2 wheels) Transfers: Sit to/from Stand Sit to Stand: Supervision           General transfer comment: upright posture with RW. Safe technique. Vitals monitored throughout session    Ambulation/Gait Ambulation/Gait assistance: Contact guard assist Gait Distance (Feet): 200 Feet Assistive device: Rolling walker (2 wheels), None Gait Pattern/deviations: Step-through pattern       General Gait Details: ambulated 100' with AD demonstrating reciprocal gait and good speed. Next 100' performed without AD with slight veering to left and noted elevation in HR to 103bpm.  Stairs            Wheelchair Mobility     Tilt Bed    Modified Rankin (Stroke Patients Only)       Balance Overall balance assessment: Needs assistance Sitting-balance support: Feet supported Sitting balance-Leahy Scale: Good     Standing balance support: During functional activity, Reliant on assistive device for balance, No upper extremity supported Standing balance-Leahy Scale: Fair                               Pertinent Vitals/Pain Pain Assessment Pain Assessment: No/denies pain Faces Pain Scale: Hurts a little bit Pain Location: scab on L knee Pain Descriptors / Indicators: Discomfort Pain Intervention(s): Limited activity within patient's tolerance, Repositioned    Home Living Family/patient expects to be discharged to:: Private residence Living Arrangements: Spouse/significant other  Available Help at Discharge: Family;Available PRN/intermittently Type of Home: House Home Access: Stairs to enter;Ramped entrance Entrance Stairs-Rails: None Entrance Stairs-Number of Steps: 6   Home Layout: Two level;Able to live on main level with bedroom/bathroom Home Equipment: Rolling Walker (2 wheels);Cane -  single point;Shower seat      Prior Function Prior Level of Function : Independent/Modified Independent;History of Falls (last six months)             Mobility Comments: amb with no AD; fall history ADLs Comments: MOD I for ADL, wife assists with IADLs such as med management, driving (though he did drive a few times after knee sx- when he started falling he stopped)     Extremity/Trunk Assessment   Upper Extremity Assessment Upper Extremity Assessment: Overall WFL for tasks assessed    Lower Extremity Assessment Lower Extremity Assessment: Overall WFL for tasks assessed       Communication   Communication Communication: Impaired Factors Affecting Communication: Hearing impaired    Cognition Arousal: Alert Behavior During Therapy: WFL for tasks assessed/performed   PT - Cognitive impairments: No apparent impairments                       PT - Cognition Comments: pleasant, however makes inappropriate jokes when asking PLOF/history Following commands: Intact       Cueing Cueing Techniques: Verbal cues     General Comments General comments (skin integrity, edema, etc.): vss throughout, HR to 103 bpm while amb without RW    Exercises     Assessment/Plan    PT Assessment All further PT needs can be met in the next venue of care  PT Problem List Decreased mobility       PT Treatment Interventions      PT Goals (Current goals can be found in the Care Plan section)  Acute Rehab PT Goals Patient Stated Goal: to go home PT Goal Formulation: All assessment and education complete, DC therapy Time For Goal Achievement: 09/15/23 Potential to Achieve Goals: Good    Frequency       Co-evaluation PT/OT/SLP Co-Evaluation/Treatment: Yes Reason for Co-Treatment: To address functional/ADL transfers PT goals addressed during session: Mobility/safety with mobility OT goals addressed during session: ADL's and self-care       AM-PAC PT 6 Clicks Mobility   Outcome Measure Help needed turning from your back to your side while in a flat bed without using bedrails?: None Help needed moving from lying on your back to sitting on the side of a flat bed without using bedrails?: None Help needed moving to and from a bed to a chair (including a wheelchair)?: None Help needed standing up from a chair using your arms (e.g., wheelchair or bedside chair)?: None Help needed to walk in hospital room?: None Help needed climbing 3-5 steps with a railing? : None 6 Click Score: 24    End of Session   Activity Tolerance: Patient tolerated treatment well Patient left: in bed Nurse Communication: Mobility status PT Visit Diagnosis: Difficulty in walking, not elsewhere classified (R26.2)    Time: 8962-8945 PT Time Calculation (min) (ACUTE ONLY): 17 min   Charges:   PT Evaluation $PT Eval Low Complexity: 1 Low   PT General Charges $$ ACUTE PT VISIT: 1 Visit         Corean Dade, PT, DPT, GCS [phone]   Castella Lerner 09/15/2023, 1:06 PM

## 2023-09-15 NOTE — Assessment & Plan Note (Signed)
 Traumatic subdural hematoma seen on imaging.  Neurosurgery and neurology cleared to go home.  Did well with physical therapy.  Continue to hold Plavix  for another 7 days.  Follow-up with neurosurgery back in 3 weeks and a repeat CT scan in about 4 weeks.

## 2023-09-17 ENCOUNTER — Telehealth: Payer: Self-pay | Admitting: Neurosurgery

## 2023-09-17 DIAGNOSIS — S065XAA Traumatic subdural hemorrhage with loss of consciousness status unknown, initial encounter: Secondary | ICD-10-CM

## 2023-09-17 NOTE — Telephone Encounter (Signed)
 Clois Fret, MD  P [facility]-Neurosurgery Admin Follow up in 3-4 weeks with CT scan head without contrast

## 2023-09-17 NOTE — Telephone Encounter (Signed)
Verdi scan order placed.

## 2023-09-18 ENCOUNTER — Ambulatory Visit: Admitting: Physical Therapy

## 2023-09-18 NOTE — Telephone Encounter (Signed)
 Patient scheduled for 08.19.25

## 2023-09-20 ENCOUNTER — Ambulatory Visit
Admission: RE | Admit: 2023-09-20 | Discharge: 2023-09-20 | Disposition: A | Discharge status: Home / Self Care | Source: Ambulatory Visit | Attending: Neurosurgery | Admitting: Neurosurgery

## 2023-09-20 ENCOUNTER — Ambulatory Visit: Admitting: Physical Therapy

## 2023-09-20 DIAGNOSIS — S065XAA Traumatic subdural hemorrhage with loss of consciousness status unknown, initial encounter: Secondary | ICD-10-CM | POA: Insufficient documentation

## 2023-09-21 ENCOUNTER — Telehealth: Payer: Self-pay | Admitting: Neurosurgery

## 2023-09-21 NOTE — Telephone Encounter (Signed)
 I spoke with Vernon Payne.  He is feeling somewhat better, but still having some intermittent numbness.  I reviewed his CT scan with him.  His bleeding has begun to resolve.  It is substantially improved in my opinion.  I do not think he will need further imaging unless he has a change in condition.  I will reach out to his neurologist to see if his outpatient follow-up can be moved up given his possible seizures from this intracranial hemorrhage.  We reviewed activity limitations.  He can return to normal activities when he feels back to normal.  For now, I have recommended that he continue light activities and refrain from any activities that might put him at risk of falls.

## 2023-09-24 ENCOUNTER — Ambulatory Visit

## 2023-09-25 ENCOUNTER — Ambulatory Visit: Attending: Neurology | Admitting: Physical Therapy

## 2023-09-25 DIAGNOSIS — M6281 Muscle weakness (generalized): Secondary | ICD-10-CM | POA: Diagnosis present

## 2023-09-25 DIAGNOSIS — R269 Unspecified abnormalities of gait and mobility: Secondary | ICD-10-CM | POA: Insufficient documentation

## 2023-09-25 DIAGNOSIS — I639 Cerebral infarction, unspecified: Secondary | ICD-10-CM | POA: Insufficient documentation

## 2023-09-25 DIAGNOSIS — R2689 Other abnormalities of gait and mobility: Secondary | ICD-10-CM | POA: Insufficient documentation

## 2023-09-25 DIAGNOSIS — R262 Difficulty in walking, not elsewhere classified: Secondary | ICD-10-CM | POA: Insufficient documentation

## 2023-09-25 DIAGNOSIS — G3184 Mild cognitive impairment, so stated: Secondary | ICD-10-CM | POA: Diagnosis present

## 2023-09-25 DIAGNOSIS — R2681 Unsteadiness on feet: Secondary | ICD-10-CM | POA: Diagnosis present

## 2023-09-25 DIAGNOSIS — R41841 Cognitive communication deficit: Secondary | ICD-10-CM | POA: Insufficient documentation

## 2023-09-25 NOTE — Therapy (Unsigned)
 OUTPATIENT PHYSICAL THERAPY NEURO TREATMENT   Patient Name: Vernon Payne. MRN: [id number] DOB:1950-04-09, 73 y.o., adult Today's Date: 09/26/2023   PCP: Cleotilde Oneil FALCON, MD  REFERRING PROVIDER: Maree Jannett POUR, MD  END OF SESSION:  PT End of Session - 09/25/23 1441     Visit Number 2    Number of Visits 24    Progress Note Due on Visit 10    PT Start Time 1446    PT Stop Time 1527    PT Time Calculation (min) 41 min    Equipment Utilized During Treatment Gait belt    Activity Tolerance Patient tolerated treatment well    Behavior During Therapy WFL for tasks assessed/performed          Past Medical History:  Diagnosis Date   Actinic keratosis    Arthritis    Diabetes mellitus without complication (HCC)    GERD (gastroesophageal reflux disease)    Hard of hearing    left hearing aid   HLD (hyperlipidemia)    HTN (hypertension)    Hypotension, postural    IBS (irritable bowel syndrome)    Primary osteoarthritis of right knee    Sleep apnea    Squamous cell carcinoma of skin 01/20/2010   Right chest. Well differentiated. Excised: 01/27/2010   Squamous cell carcinoma of skin 07/15/2019   R preauricular ant to lobe   Stroke [facility])    May 31/ 2023   Wears dentures    partial upper, implants on bottom   Past Surgical History:  Procedure Laterality Date   APPENDECTOMY     BACK SURGERY     lumbar   CATARACT EXTRACTION W/PHACO Right 08/29/2017   Procedure: CATARACT EXTRACTION PHACO AND INTRAOCULAR LENS PLACEMENT (IOC) RIGHT DIABETIC;  Surgeon: Mittie Gaskin, MD;  Location: [facility];  Service: Ophthalmology;  Laterality: Right;  ISTENT INJECT   CATARACT EXTRACTION W/PHACO Left 11/21/2017   Procedure: CATARACT EXTRACTION PHACO AND INTRAOCULAR LENS PLACEMENT (IOC);  Surgeon: Mittie Gaskin, MD;  Location: [facility];  Service: Ophthalmology;  Laterality: Left;   COLONOSCOPY WITH PROPOFOL  N/A 01/26/2023   Procedure: COLONOSCOPY WITH  PROPOFOL ;  Surgeon: Onita Elspeth Sharper, DO;  Location: [facility] END[facility];  Service: Gastroenterology;  Laterality: N/A;   EYE SURGERY Left 11/21/2017   cataract excision   INSERTION OF ANTERIOR SEGMENT AQUEOUS DRAINAGE DEVICE (ISTENT) Right 08/29/2017   Procedure: (ISTENT);  Surgeon: Mittie Gaskin, MD;  Location: [facility];  Service: Ophthalmology;  Laterality: Right;  Diabetic - oral meds   INSERTION OF ANTERIOR SEGMENT AQUEOUS DRAINAGE DEVICE (ISTENT) Left 11/21/2017   Procedure: INSERTION OF ANTERIOR SEGMENT AQUEOUS DRAINAGE DEVICE (ISTENT)  INJECT DIABETES ISTENT INJECT;  Surgeon: Mittie Gaskin, MD;  Location: [facility];  Service: Ophthalmology;  Laterality: Left;  Diabetic - oral meds   JOINT REPLACEMENT     left knee replacement   KNEE ARTHROPLASTY Right 04/30/2023   Procedure: ARTHROPLASTY, KNEE, TOTAL, USING IMAGELESS COMPUTER-ASSISTED NAVIGATION;  Surgeon: Mardee Lynwood SQUIBB, MD;  Location: [facility] ORS;  Service: Orthopedics;  Laterality: Right;   KYPHOPLASTY N/A 03/28/2019   Procedure: L1 KYPHOPLASTY;  Surgeon: Kathlynn Sharper, MD;  Location: [facility] OR[facility];  Service: Orthopedics;  Laterality: N/A;   LASIK     POLYPECTOMY  01/26/2023   Procedure: POLYPECTOMY;  Surgeon: Onita Elspeth Sharper, DO;  Location: [facility] END[facility]COPY;  Service: Gastroenterology;;   MARCELINE CUFF REPAIR     TONSILLECTOMY     Patient Active Problem List   Diagnosis Date Noted  SAH (subarachnoid hemorrhage) (HCC) 09/14/2023   SDH (subdural hematoma) (HCC) 09/14/2023   Fall 09/14/2023   Type 2 diabetes mellitus with peripheral neuropathy (HCC) 09/14/2023   Depression 09/14/2023   Focal seizure (HCC) 09/14/2023   History of total knee arthroplasty, right 04/30/2023   Acute CVA (cerebrovascular accident) (HCC)    Right hemiparesis (HCC) 07/20/2021   Closed compression fracture of body of L1 vertebra (HCC) 04/28/2019   Family history of colon cancer 03/26/2018   Primary osteoarthritis of right  knee 09/04/2017   Primary osteoarthritis of left knee 09/04/2017   Status post left partial knee replacement 09/04/2017   OSA on CPAP 01/24/2017   Yellow jacket sting 10/31/2016   HTN (hypertension) 10/31/2016   Type 2 diabetes mellitus with hyperlipidemia (HCC) 10/31/2016   HLD (hyperlipidemia) 10/31/2016   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   Benign essential HTN 10/15/2013    ONSET DATE: 04/30/23  REFERRING DIAG: R26.89 (ICD-10-CM) - Imbalance   THERAPY DIAG:  Muscle weakness (generalized)  Difficulty in walking, not elsewhere classified  Abnormality of gait and mobility  Other abnormalities of gait and mobility  Unsteadiness on feet  Rationale for Evaluation and Treatment: Rehabilitation  SUBJECTIVE:                                                                                                                                                                                             SUBJECTIVE STATEMENT: Patient reports having to be hospitalized since last visit.  Patient has new order chart.  Patient having some pain in his head since hospital visit but doctors are aware.  Patient's blood pressure was checked and was within normal limits this date.  Patien instructed to inform doctor of discomfort in his head between now and next session.    From Eval: Pt had knee replacement in the R side on April 30, 2023. Pt had first fall on 06/2023. Pt had surgery with Dr. Hooten. Pt reports he still has clicking in the right knee and has pain in the right knee that he reports as being worse since before the surgery. Pt reports he is going to see the doctor in a few months again regarding the right knee. Pt reports not walking as well as he used to and notices he moves to the left when he is ambulating. Pt also notes recent unintentional weight loss, has discussed it with MD per his report.   Pt accompanied by: significant other  PERTINENT HISTORY: Pt familiar to clinic  previously for post stroke rehab in later half of 2023. Pt has hx of  OA, Diabetes mellitus type 2,  controlled, with complications, GERD, Glaucoma   Hemorrhoids   Hyperlipidemia,Hypertension, OSA on CPAP   PAIN:  Are you having pain? Yes: NPRS scale: 7 Pain location: R knee Pain description: constant Aggravating factors: kneeling Relieving factors: n/a  PRECAUTIONS: Fall  RED FLAGS: None   WEIGHT BEARING RESTRICTIONS: No  FALLS: Has patient fallen in last 6 months? Yes. Number of falls 6-7  LIVING ENVIRONMENT: Lives with: lives with their family Lives in: House/apartment Stairs: Yes: External: 7 steps; none Has following equipment at home: Single point cane and Walker - 2 wheeled  PLOF: Independent with basic ADLs and has lessened his activity and uses his wife to assist with things  PATIENT GOALS: Improve balance, gait and mobility   OBJECTIVE:  Note: Objective measures were completed at Evaluation unless otherwise noted.  DIAGNOSTIC FINDINGS: n/a  COGNITION: Overall cognitive status: Within functional limits for tasks assessed   SENSATION: Not tested  LOWER EXTREMITY ROM:     Active  Right Eval Left Eval  Hip flexion    Hip extension    Hip abduction    Hip adduction    Hip internal rotation    Hip external rotation    Knee flexion    Knee extension 15 deg from full ext seated AROM    Ankle dorsiflexion    Ankle plantarflexion    Ankle inversion    Ankle eversion     (Blank rows = not tested)  LOWER EXTREMITY MMT:    MMT Right Eval Left Eval  Hip flexion 3+ 4  Hip extension    Hip abduction 4 4+  Hip adduction 4+ 4+  Hip internal rotation    Hip external rotation    Knee flexion 4 4+  Knee extension 4 4+  Ankle dorsiflexion 4+ 4+  Ankle plantarflexion    Ankle inversion    Ankle eversion    (Blank rows = not tested)  BED MOBILITY:  No reported issues with this  TRANSFERS: Sit to stand: Modified independence and CGA  Assistive device  utilized: hands needed to complete     Stand to sit: Modified independence  Assistive device utilized: constols descent with hands        STAIRS: Pt reports using step to pattern with at least one fall on steps in past 6 months.  GAIT: Findings: Gait Characteristics: R knee does get to full extension, decreased step length- Right, decreased step length- Left, decreased stride length, decreased hip/knee flexion- Right, and decreased hip/knee flexion- Left, Distance walked: 30 ft, Level of assistance: Modified independence, and Comments: with turning around pt shows visible instability and mutiple small steps to keep balance, reaches for objects when near them in counter/furniture surfing style walking   FUNCTIONAL TESTS:   Orthostatic assessment: seated 142/76 HR 79 standing 136/73 HR 83  5 times sit to stand: 35.22 sec with heavyUE A Timed up and go (TUG): 19.88 sec  6 minute walk test: : test visit 2  10 meter walk test: .81 m/s BERG test visit 2   PATIENT SURVEYS:  ABC scale: The Activities-Specific Balance Confidence (ABC) Scale 0% 10 20 30  40 50 60 70 80 90 100% No confidence<->completely confident  "How confident are you that you will not lose your balance or become unsteady when you . . .   Date tested 09/12/23  Walk around the house 70%  2. Walk up or down stairs 60%  3. Bend over and pick up a slipper from in front of a closet  floor 30%  4. Reach for a small can off a shelf at eye level 100%  5. Stand on tip toes and reach for something above your head 80%  6. Stand on a chair and reach for something 0%  7. Sweep the floor 40%  8. Walk outside the house to a car parked in the driveway 80%  9. Get into or out of a car 90%  10. Walk across a parking lot to the mall 80%  11. Walk up or down a ramp 80%  12. Walk in a crowded mall where people rapidly walk past you 70%  13. Are bumped into by people as you walk through the mall 70%  14. Step onto or off of an escalator  while you are holding onto the railing 60%  15. Step onto or off an escalator while holding onto parcels such that you cannot hold onto the railing 60%  16. Walk outside on icy sidewalks 10%  Total: #/16 61.9%                                                                                                                                 TREATMENT DATE: 09/26/23   Physical Performance Test or Measurement: a  physical performance test(s) or measurement (eg,  musculoskeletal, functional capacity), with written report,  each 15 mins   6 Min Walk Test:  Instructed patient to ambulate as quickly and as safely as possible for 6 minutes using LRAD. Patient was allowed to take standing rest breaks without stopping the test, but if the patient required a sitting rest break the clock would be stopped and the test would be over.  Results: 511 feet using a RW . Results indicate that the patient has reduced endurance with ambulation compared to age matched norms.  Age Matched Norms (in meters): 38-69 yo M: 70 F: 66, 5-79 yo M: 25 F: 471, 11-89 yo M: 417 F: 392 MDC: 58.21 meters (190.98 feet) or 50 meters (ANPTA Core Set of Outcome Measures for Adults with Neurologic Conditions, 2018)  Patient demonstrates increased fall risk as noted by score of  39 /56 on Berg Balance Scale.  (<36= high risk for falls, close to 100%; 37-45 significant >80%; 46-51 moderate >50%; 52-55 lower >25%)    BP: 126/75  mmHg with HR 90 -patient having some discomfort in area behind his eye.  He reports has been having this since he was hospitalized and his doctor is aware physical therapist encourages patient to speak with his doctor about it regardless.  Instructed in the following exercises - TE- To improve strength, endurance, mobility, and function of specific targeted muscle groups or improve joint range of motion or improve muscle flexibility    Exercises - Marching Near Counter  - 1 x daily - 7 x weekly - 2 sets -  10 reps - Backward Walking with Counter Support  - 1 x daily - 7 x  weekly - 2 sets  PATIENT EDUCATION: Education details: POC Person educated: Patient Education method: Explanation Education comprehension: verbalized understanding   HOME EXERCISE PROGRAM: Access Code: [email or web address] URL: [email or web address] Date: 09/25/2023 Prepared by: Lonni Gainer  Exercises - Marching Near Counter  - 1 x daily - 7 x weekly - 2 sets - 10 reps - Backward Walking with Counter Support  - 1 x daily - 7 x weekly - 2 sets   GOALS: Goals reviewed with patient? Yes  SHORT TERM GOALS: Target date: 10/10/2023       Patient will be independent in home exercise program to improve strength/mobility for better functional independence with ADLs. Baseline: No HEP currently  Goal status: INITIAL   LONG TERM GOALS: Target date: 12/05/2023    1.  Patient will complete five times sit to stand test in < 15 seconds indicating an increased LE strength and improved balance. Baseline: 35.22 sec  with heavy UE assist Goal status: INITIAL  2.  Patient will improve ABC score to 71.5%   to demonstrate statistically significant improvement in mobility and quality of life as it relates to their balance and mobility.  Baseline: 61.9% Goal status: INITIAL   3.  Patient will increase Berg Balance score by > 6 points to demonstrate decreased fall risk during functional activities. Baseline: test visit 2  Goal status: INITIAL   4.   Patient will reduce timed up and go to <11 seconds to reduce fall risk and demonstrate improved transfer/gait ability. Baseline: 19.88 sec Goal status: INITIAL  5.   Patient will increase 10 meter walk test to >1.13m/s as to improve gait speed for better community ambulation and to reduce fall risk. Baseline: .31m/s Goal status: INITIAL  6.   Patient will increase six minute walk test distance by greater than 150 ft for progression to community ambulator and  improve gait ability Baseline: test visit 2  Goal status: INITIAL    ASSESSMENT:  CLINICAL IMPRESSION:  Patient presents to physical therapy with good motivation for completion of activities.  Patient having continued balance and functional impairments as evidenced by patient having a near fall just when walking into clinic and holding onto wife.  Patient utilizes straight point cane but was instructed he will likely need more than this in order to keep his balance was encouraged to utilize a rolling walker until he is able to get stronger and more stable.  Patient seems to have an impaired awareness of his own impairments.  Patient demonstrate significant difficulty with 6-minute walk test as well as significant risk factor for falls evidenced by Lars balance score.  Patient provided with initial home exercise program was instructed to hold off on completing if he is having any discomfort in his knee or elsewhere. Pt will continue to benefit from skilled physical therapy intervention to address impairments, improve QOL, and attain therapy goals.    OBJECTIVE IMPAIRMENTS: Abnormal gait, decreased activity tolerance, decreased balance, decreased endurance, decreased mobility, difficulty walking, decreased strength, and pain.   ACTIVITY LIMITATIONS: standing, squatting, stairs, transfers, and locomotion level  PARTICIPATION LIMITATIONS: shopping, community activity, and yard work  PERSONAL FACTORS: Age, Behavior pattern, Time since onset of injury/illness/exacerbation, and 3+ comorbidities: DM, HTN, OA, HLD, OSA, R knee pain are also affecting patient's functional outcome.   REHAB POTENTIAL: Good  CLINICAL DECISION MAKING: Evolving/moderate complexity  EVALUATION COMPLEXITY: Moderate  PLAN:  PT FREQUENCY: 2x/week  PT DURATION: 12 weeks  PLANNED INTERVENTIONS: 97750- Physical Performance Testing, 97110-Therapeutic exercises, 97530- Therapeutic  activity, [id number]- Neuromuscular  re-education, [phone]- Self Care, 02859- Manual therapy, [phone]- Gait training, Balance training, Stair training, Joint mobilization, Cryotherapy, and Moist heat  PLAN FOR NEXT SESSION:  Monitor head symptoms  Lonni KATHEE Gainer, PT 09/26/2023, 9:44 AM

## 2023-09-27 ENCOUNTER — Ambulatory Visit

## 2023-09-27 DIAGNOSIS — R2681 Unsteadiness on feet: Secondary | ICD-10-CM

## 2023-09-27 DIAGNOSIS — M6281 Muscle weakness (generalized): Secondary | ICD-10-CM | POA: Diagnosis not present

## 2023-09-27 DIAGNOSIS — R262 Difficulty in walking, not elsewhere classified: Secondary | ICD-10-CM

## 2023-09-27 DIAGNOSIS — R2689 Other abnormalities of gait and mobility: Secondary | ICD-10-CM

## 2023-09-27 DIAGNOSIS — R269 Unspecified abnormalities of gait and mobility: Secondary | ICD-10-CM

## 2023-09-27 NOTE — Therapy (Signed)
 OUTPATIENT PHYSICAL THERAPY NEURO TREATMENT   Patient Name: Vernon Payne. MRN: [id number] DOB:04-24-1950, 73 y.o., adult Today's Date: 09/27/2023   PCP: Cleotilde Oneil FALCON, MD  REFERRING PROVIDER: Maree Jannett POUR, MD  END OF SESSION:  PT End of Session - 09/27/23 9062     Visit Number 3    Number of Visits 24    Progress Note Due on Visit 10    PT Start Time 0934    PT Stop Time 1015    PT Time Calculation (min) 41 min    Equipment Utilized During Treatment Gait belt    Activity Tolerance Patient tolerated treatment well    Behavior During Therapy WFL for tasks assessed/performed          Past Medical History:  Diagnosis Date   Actinic keratosis    Arthritis    Diabetes mellitus without complication (HCC)    GERD (gastroesophageal reflux disease)    Hard of hearing    left hearing aid   HLD (hyperlipidemia)    HTN (hypertension)    Hypotension, postural    IBS (irritable bowel syndrome)    Primary osteoarthritis of right knee    Sleep apnea    Squamous cell carcinoma of skin 01/20/2010   Right chest. Well differentiated. Excised: 01/27/2010   Squamous cell carcinoma of skin 07/15/2019   R preauricular ant to lobe   Stroke [facility])    May 31/ 2023   Wears dentures    partial upper, implants on bottom   Past Surgical History:  Procedure Laterality Date   APPENDECTOMY     BACK SURGERY     lumbar   CATARACT EXTRACTION W/PHACO Right 08/29/2017   Procedure: CATARACT EXTRACTION PHACO AND INTRAOCULAR LENS PLACEMENT (IOC) RIGHT DIABETIC;  Surgeon: Mittie Gaskin, MD;  Location: [facility];  Service: Ophthalmology;  Laterality: Right;  ISTENT INJECT   CATARACT EXTRACTION W/PHACO Left 11/21/2017   Procedure: CATARACT EXTRACTION PHACO AND INTRAOCULAR LENS PLACEMENT (IOC);  Surgeon: Mittie Gaskin, MD;  Location: [facility];  Service: Ophthalmology;  Laterality: Left;   COLONOSCOPY WITH PROPOFOL  N/A 01/26/2023   Procedure: COLONOSCOPY WITH  PROPOFOL ;  Surgeon: Onita Elspeth Sharper, DO;  Location: [facility] END[facility]COPY;  Service: Gastroenterology;  Laterality: N/A;   EYE SURGERY Left 11/21/2017   cataract excision   INSERTION OF ANTERIOR SEGMENT AQUEOUS DRAINAGE DEVICE (ISTENT) Right 08/29/2017   Procedure: (ISTENT);  Surgeon: Mittie Gaskin, MD;  Location: [facility];  Service: Ophthalmology;  Laterality: Right;  Diabetic - oral meds   INSERTION OF ANTERIOR SEGMENT AQUEOUS DRAINAGE DEVICE (ISTENT) Left 11/21/2017   Procedure: INSERTION OF ANTERIOR SEGMENT AQUEOUS DRAINAGE DEVICE (ISTENT)  INJECT DIABETES ISTENT INJECT;  Surgeon: Mittie Gaskin, MD;  Location: [facility];  Service: Ophthalmology;  Laterality: Left;  Diabetic - oral meds   JOINT REPLACEMENT     left knee replacement   KNEE ARTHROPLASTY Right 04/30/2023   Procedure: ARTHROPLASTY, KNEE, TOTAL, USING IMAGELESS COMPUTER-ASSISTED NAVIGATION;  Surgeon: Mardee Lynwood SQUIBB, MD;  Location: [facility] ORS;  Service: Orthopedics;  Laterality: Right;   KYPHOPLASTY N/A 03/28/2019   Procedure: L1 KYPHOPLASTY;  Surgeon: Kathlynn Sharper, MD;  Location: [facility] ORS;  Service: Orthopedics;  Laterality: N/A;   LASIK     POLYPECTOMY  01/26/2023   Procedure: POLYPECTOMY;  Surgeon: Onita Elspeth Sharper, DO;  Location: [facility];  Service: Gastroenterology;;   MARCELINE CUFF REPAIR     TONSILLECTOMY     Patient Active Problem List   Diagnosis Date Noted  SAH (subarachnoid hemorrhage) (HCC) 09/14/2023   SDH (subdural hematoma) (HCC) 09/14/2023   Fall 09/14/2023   Type 2 diabetes mellitus with peripheral neuropathy (HCC) 09/14/2023   Depression 09/14/2023   Focal seizure (HCC) 09/14/2023   History of total knee arthroplasty, right 04/30/2023   Acute CVA (cerebrovascular accident) (HCC)    Right hemiparesis (HCC) 07/20/2021   Closed compression fracture of body of L1 vertebra (HCC) 04/28/2019   Family history of colon cancer 03/26/2018   Primary osteoarthritis of right  knee 09/04/2017   Primary osteoarthritis of left knee 09/04/2017   Status post left partial knee replacement 09/04/2017   OSA on CPAP 01/24/2017   Yellow jacket sting 10/31/2016   HTN (hypertension) 10/31/2016   Type 2 diabetes mellitus with hyperlipidemia (HCC) 10/31/2016   HLD (hyperlipidemia) 10/31/2016   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   Benign essential HTN 10/15/2013    ONSET DATE: 04/30/23  REFERRING DIAG: R26.89 (ICD-10-CM) - Imbalance   THERAPY DIAG:  Muscle weakness (generalized)  Difficulty in walking, not elsewhere classified  Abnormality of gait and mobility  Other abnormalities of gait and mobility  Unsteadiness on feet  Rationale for Evaluation and Treatment: Rehabilitation  SUBJECTIVE:                                                                                                                                                                                             SUBJECTIVE STATEMENT:  Pt reports no pain upon arrival, just numbness.  Pt feels the numbness more in the ankles and the R knee.  Pt does have a scratch that is actively bleeding from the dogs.  Pt also supports having some numbness in his head.   From Eval: Pt had knee replacement in the R side on April 30, 2023. Pt had first fall on 06/2023. Pt had surgery with Dr. Hooten. Pt reports he still has clicking in the right knee and has pain in the right knee that he reports as being worse since before the surgery. Pt reports he is going to see the doctor in a few months again regarding the right knee. Pt reports not walking as well as he used to and notices he moves to the left when he is ambulating. Pt also notes recent unintentional weight loss, has discussed it with MD per his report.   Pt accompanied by: significant other  PERTINENT HISTORY: Pt familiar to clinic previously for post stroke rehab in later half of 2023. Pt has hx of  OA, Diabetes mellitus type 2, controlled, with  complications, GERD, Glaucoma   Hemorrhoids   Hyperlipidemia,Hypertension, OSA on CPAP  PAIN:  Are you having pain? Yes: NPRS scale: 7 Pain location: R knee Pain description: constant Aggravating factors: kneeling Relieving factors: n/a  PRECAUTIONS: Fall  RED FLAGS: None   WEIGHT BEARING RESTRICTIONS: No  FALLS: Has patient fallen in last 6 months? Yes. Number of falls 6-7  LIVING ENVIRONMENT: Lives with: lives with their family Lives in: House/apartment Stairs: Yes: External: 7 steps; none Has following equipment at home: Single point cane and Walker - 2 wheeled  PLOF: Independent with basic ADLs and has lessened his activity and uses his wife to assist with things  PATIENT GOALS: Improve balance, gait and mobility   OBJECTIVE:  Note: Objective measures were completed at Evaluation unless otherwise noted.  DIAGNOSTIC FINDINGS: n/a  COGNITION: Overall cognitive status: Within functional limits for tasks assessed   SENSATION: Not tested  LOWER EXTREMITY ROM:     Active  Right Eval Left Eval  Hip flexion    Hip extension    Hip abduction    Hip adduction    Hip internal rotation    Hip external rotation    Knee flexion    Knee extension 15 deg from full ext seated AROM    Ankle dorsiflexion    Ankle plantarflexion    Ankle inversion    Ankle eversion     (Blank rows = not tested)  LOWER EXTREMITY MMT:    MMT Right Eval Left Eval  Hip flexion 3+ 4  Hip extension    Hip abduction 4 4+  Hip adduction 4+ 4+  Hip internal rotation    Hip external rotation    Knee flexion 4 4+  Knee extension 4 4+  Ankle dorsiflexion 4+ 4+  Ankle plantarflexion    Ankle inversion    Ankle eversion    (Blank rows = not tested)  BED MOBILITY:  No reported issues with this  TRANSFERS: Sit to stand: Modified independence and CGA  Assistive device utilized: hands needed to complete     Stand to sit: Modified independence  Assistive device utilized: constols  descent with hands        STAIRS: Pt reports using step to pattern with at least one fall on steps in past 6 months.  GAIT: Findings: Gait Characteristics: R knee does get to full extension, decreased step length- Right, decreased step length- Left, decreased stride length, decreased hip/knee flexion- Right, and decreased hip/knee flexion- Left, Distance walked: 30 ft, Level of assistance: Modified independence, and Comments: with turning around pt shows visible instability and mutiple small steps to keep balance, reaches for objects when near them in counter/furniture surfing style walking   FUNCTIONAL TESTS:   Orthostatic assessment: seated 142/76 HR 79 standing 136/73 HR 83  5 times sit to stand: 35.22 sec with heavyUE A Timed up and go (TUG): 19.88 sec  6 minute walk test: : test visit 2  10 meter walk test: .81 m/s BERG test visit 2   PATIENT SURVEYS:  ABC scale: The Activities-Specific Balance Confidence (ABC) Scale 0% 10 20 30  40 50 60 70 80 90 100% No confidence<->completely confident  "How confident are you that you will not lose your balance or become unsteady when you . . .   Date tested 09/12/23  Walk around the house 70%  2. Walk up or down stairs 60%  3. Bend over and pick up a slipper from in front of a closet floor 30%  4. Reach for a small can off a shelf at eye level 100%  5. Stand on tip toes and reach for something above your head 80%  6. Stand on a chair and reach for something 0%  7. Sweep the floor 40%  8. Walk outside the house to a car parked in the driveway 80%  9. Get into or out of a car 90%  10. Walk across a parking lot to the mall 80%  11. Walk up or down a ramp 80%  12. Walk in a crowded mall where people rapidly walk past you 70%  13. Are bumped into by people as you walk through the mall 70%  14. Step onto or off of an escalator while you are holding onto the railing 60%  15. Step onto or off an escalator while holding onto parcels such that  you cannot hold onto the railing 60%  16. Walk outside on icy sidewalks 10%  Total: #/16 61.9%                                                                                                                                 TREATMENT DATE: 09/27/23   Gait Training:  CGA assist utilized throughout the session unless otherwise indicated.  Ambulation in hallway and around gym with height adjusted cane, length of hallway x2, verbal and visual cues given for proper form  Retro ambulation with use of cane, length of hallway x2; verbal and visual cues given for proper form  Lateral ambulation, length of hallway x2; verbal and visual cues given for proper form   TherAct:  STS 2x10, use of CGA and airex pad under knees to assist with attempt due to pt's height making it more difficult to perform  Seated marches without back support in order to increase core activation, 2x10  Pt reports feeling lightheaded following ambulation in the hallway, so vitals were monitored  BP: 127/80 HR: 76     PATIENT EDUCATION: Education details: Pt educated throughout session about proper posture and technique with exercises. Improved exercise technique, movement at target joints, use of target muscles after min to mod verbal, visual, tactile cues Person educated: Patient Education method: Explanation Education comprehension: verbalized understanding   HOME EXERCISE PROGRAM: Access Code: [email or web address] URL: [email or web address] Date: 09/25/2023 Prepared by: Lonni Gainer  Exercises - Marching Near Counter  - 1 x daily - 7 x weekly - 2 sets - 10 reps - Backward Walking with Counter Support  - 1 x daily - 7 x weekly - 2 sets   GOALS: Goals reviewed with patient? Yes  SHORT TERM GOALS: Target date: 10/10/2023  Patient will be independent in home exercise program to improve strength/mobility for better functional independence with ADLs. Baseline: No HEP currently  Goal status:  INITIAL   LONG TERM GOALS: Target date: 12/05/2023  1.  Patient will complete five times sit to stand test in < 15 seconds indicating an increased LE strength and improved balance. Baseline: 35.22 sec  with heavy UE assist  Goal status: INITIAL  2.  Patient will improve ABC score to 71.5%   to demonstrate statistically significant improvement in mobility and quality of life as it relates to their balance and mobility.  Baseline: 61.9% Goal status: INITIAL   3.  Patient will increase Berg Balance score by > 6 points to demonstrate decreased fall risk during functional activities. Baseline: test visit 2  Goal status: INITIAL   4.   Patient will reduce timed up and go to <11 seconds to reduce fall risk and demonstrate improved transfer/gait ability. Baseline: 19.88 sec Goal status: INITIAL  5.   Patient will increase 10 meter walk test to >1.78m/s as to improve gait speed for better community ambulation and to reduce fall risk. Baseline: .44m/s Goal status: INITIAL  6.   Patient will increase six minute walk test distance by greater than 150 ft for progression to community ambulator and improve gait ability Baseline: test visit 2  Goal status: INITIAL    ASSESSMENT:  CLINICAL IMPRESSION:  Pt still experiencing headaches throughout the session and was noted to be off balance on several different occasions with ambulation.  Pt and spouse encouraged to reach out to MD since the pt has BP that is within normal range, but has the headaches and lightheadedness when performing exercises.  Spouse is especially concerned that there may be another brain bleed.  Pt and spouse encouraged to reach out today if possible to get the ball rolling a little quicker with the weekend approaching.  Pt will continue to benefit from continued balance training and ambulation attempts with different assistive devices in order to improve current QoL.     OBJECTIVE IMPAIRMENTS: Abnormal gait, decreased  activity tolerance, decreased balance, decreased endurance, decreased mobility, difficulty walking, decreased strength, and pain.   ACTIVITY LIMITATIONS: standing, squatting, stairs, transfers, and locomotion level  PARTICIPATION LIMITATIONS: shopping, community activity, and yard work  PERSONAL FACTORS: Age, Behavior pattern, Time since onset of injury/illness/exacerbation, and 3+ comorbidities: DM, HTN, OA, HLD, OSA, R knee pain are also affecting patient's functional outcome.   REHAB POTENTIAL: Good  CLINICAL DECISION MAKING: Evolving/moderate complexity  EVALUATION COMPLEXITY: Moderate  PLAN:  PT FREQUENCY: 2x/week  PT DURATION: 12 weeks  PLANNED INTERVENTIONS: 97750- Physical Performance Testing, 97110-Therapeutic exercises, 97530- Therapeutic activity, 97112- Neuromuscular re-education, 97535- Self Care, [id number]- Manual therapy, [phone]- Gait training, Balance training, Stair training, Joint mobilization, Cryotherapy, and Moist heat  PLAN FOR NEXT SESSION:   Monitor head symptoms and see if MD has responded or another appointment as been made to be seen. Continues with balance exercises Monitor symptoms with more strenuous exercises as this seems to be the reasoning behind his symptoms    Fonda Simpers, PT, DPT Physical Therapist - [facility]  09/27/23, 12:27 PM

## 2023-10-02 ENCOUNTER — Ambulatory Visit: Admitting: Physical Therapy

## 2023-10-04 ENCOUNTER — Ambulatory Visit

## 2023-10-04 DIAGNOSIS — M6281 Muscle weakness (generalized): Secondary | ICD-10-CM

## 2023-10-04 DIAGNOSIS — R269 Unspecified abnormalities of gait and mobility: Secondary | ICD-10-CM

## 2023-10-04 DIAGNOSIS — I639 Cerebral infarction, unspecified: Secondary | ICD-10-CM

## 2023-10-04 DIAGNOSIS — R2689 Other abnormalities of gait and mobility: Secondary | ICD-10-CM

## 2023-10-04 DIAGNOSIS — R2681 Unsteadiness on feet: Secondary | ICD-10-CM

## 2023-10-04 DIAGNOSIS — R262 Difficulty in walking, not elsewhere classified: Secondary | ICD-10-CM

## 2023-10-04 NOTE — Therapy (Signed)
 OUTPATIENT PHYSICAL THERAPY NEURO TREATMENT   Patient Name: Vernon Payne. MRN: [id number] DOB:05/19/1950, 73 y.o., adult Today's Date: 10/04/2023   PCP: Cleotilde Oneil FALCON, MD  REFERRING PROVIDER: Maree Jannett POUR, MD  END OF SESSION:  PT End of Session - 10/04/23 0934     Visit Number 4    Number of Visits 24    Progress Note Due on Visit 10    PT Start Time 0933    PT Stop Time 1015    PT Time Calculation (min) 42 min    Equipment Utilized During Treatment Gait belt    Activity Tolerance Patient tolerated treatment well    Behavior During Therapy WFL for tasks assessed/performed         Past Medical History:  Diagnosis Date   Actinic keratosis    Arthritis    Diabetes mellitus without complication (HCC)    GERD (gastroesophageal reflux disease)    Hard of hearing    left hearing aid   HLD (hyperlipidemia)    HTN (hypertension)    Hypotension, postural    IBS (irritable bowel syndrome)    Primary osteoarthritis of right knee    Sleep apnea    Squamous cell carcinoma of skin 01/20/2010   Right chest. Well differentiated. Excised: 01/27/2010   Squamous cell carcinoma of skin 07/15/2019   R preauricular ant to lobe   Stroke [facility])    May 31/ 2023   Wears dentures    partial upper, implants on bottom   Past Surgical History:  Procedure Laterality Date   APPENDECTOMY     BACK SURGERY     lumbar   CATARACT EXTRACTION W/PHACO Right 08/29/2017   Procedure: CATARACT EXTRACTION PHACO AND INTRAOCULAR LENS PLACEMENT (IOC) RIGHT DIABETIC;  Surgeon: Mittie Gaskin, MD;  Location: [facility];  Service: Ophthalmology;  Laterality: Right;  ISTENT INJECT   CATARACT EXTRACTION W/PHACO Left 11/21/2017   Procedure: CATARACT EXTRACTION PHACO AND INTRAOCULAR LENS PLACEMENT (IOC);  Surgeon: Mittie Gaskin, MD;  Location: [facility];  Service: Ophthalmology;  Laterality: Left;   COLONOSCOPY WITH PROPOFOL  N/A 01/26/2023   Procedure: COLONOSCOPY WITH  PROPOFOL ;  Surgeon: Onita Elspeth Sharper, DO;  Location: [facility] END[facility]COPY;  Service: Gastroenterology;  Laterality: N/A;   EYE SURGERY Left 11/21/2017   cataract excision   INSERTION OF ANTERIOR SEGMENT AQUEOUS DRAINAGE DEVICE (ISTENT) Right 08/29/2017   Procedure: (ISTENT);  Surgeon: Mittie Gaskin, MD;  Location: [facility];  Service: Ophthalmology;  Laterality: Right;  Diabetic - oral meds   INSERTION OF ANTERIOR SEGMENT AQUEOUS DRAINAGE DEVICE (ISTENT) Left 11/21/2017   Procedure: INSERTION OF ANTERIOR SEGMENT AQUEOUS DRAINAGE DEVICE (ISTENT)  INJECT DIABETES ISTENT INJECT;  Surgeon: Mittie Gaskin, MD;  Location: [facility];  Service: Ophthalmology;  Laterality: Left;  Diabetic - oral meds   JOINT REPLACEMENT     left knee replacement   KNEE ARTHROPLASTY Right 04/30/2023   Procedure: ARTHROPLASTY, KNEE, TOTAL, USING IMAGELESS COMPUTER-ASSISTED NAVIGATION;  Surgeon: Mardee Lynwood SQUIBB, MD;  Location: [facility] ORS;  Service: Orthopedics;  Laterality: Right;   KYPHOPLASTY N/A 03/28/2019   Procedure: L1 KYPHOPLASTY;  Surgeon: Kathlynn Sharper, MD;  Location: [facility] ORS;  Service: Orthopedics;  Laterality: N/A;   LASIK     POLYPECTOMY  01/26/2023   Procedure: POLYPECTOMY;  Surgeon: Onita Elspeth Sharper, DO;  Location: [facility] ENDOSCOPY;  Service: Gastroenterology;;   MARCELINE CUFF REPAIR     TONSILLECTOMY     Patient Active Problem List   Diagnosis Date Noted  SAH (subarachnoid hemorrhage) (HCC) 09/14/2023   SDH (subdural hematoma) (HCC) 09/14/2023   Fall 09/14/2023   Type 2 diabetes mellitus with peripheral neuropathy (HCC) 09/14/2023   Depression 09/14/2023   Focal seizure (HCC) 09/14/2023   History of total knee arthroplasty, right 04/30/2023   Acute CVA (cerebrovascular accident) (HCC)    Right hemiparesis (HCC) 07/20/2021   Closed compression fracture of body of L1 vertebra (HCC) 04/28/2019   Family history of colon cancer 03/26/2018   Primary osteoarthritis of right  knee 09/04/2017   Primary osteoarthritis of left knee 09/04/2017   Status post left partial knee replacement 09/04/2017   OSA on CPAP 01/24/2017   Yellow jacket sting 10/31/2016   HTN (hypertension) 10/31/2016   Type 2 diabetes mellitus with hyperlipidemia (HCC) 10/31/2016   HLD (hyperlipidemia) 10/31/2016   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   Benign essential HTN 10/15/2013    ONSET DATE: 04/30/23  REFERRING DIAG: R26.89 (ICD-10-CM) - Imbalance   THERAPY DIAG:  Muscle weakness (generalized)  Difficulty in walking, not elsewhere classified  Abnormality of gait and mobility  Other abnormalities of gait and mobility  Unsteadiness on feet  Acute CVA (cerebrovascular accident) (HCC)  Rationale for Evaluation and Treatment: Rehabilitation  SUBJECTIVE:                                                                                                                                                                                             SUBJECTIVE STATEMENT:  Pt reports head has been doing better, and MD is not concerned.  Pt ambulates into the clinic with RW but it's not able to get tall enough for him.    From Eval: Pt had knee replacement in the R side on April 30, 2023. Pt had first fall on 06/2023. Pt had surgery with Dr. Hooten. Pt reports he still has clicking in the right knee and has pain in the right knee that he reports as being worse since before the surgery. Pt reports he is going to see the doctor in a few months again regarding the right knee. Pt reports not walking as well as he used to and notices he moves to the left when he is ambulating. Pt also notes recent unintentional weight loss, has discussed it with MD per his report.   Pt accompanied by: significant other  PERTINENT HISTORY: Pt familiar to clinic previously for post stroke rehab in later half of 2023. Pt has hx of  OA, Diabetes mellitus type 2, controlled, with complications, GERD, Glaucoma    Hemorrhoids   Hyperlipidemia,Hypertension, OSA on CPAP   PAIN:  Are you having pain?  Yes: NPRS scale: 7 Pain location: R knee Pain description: constant Aggravating factors: kneeling Relieving factors: n/a  PRECAUTIONS: Fall  RED FLAGS: None   WEIGHT BEARING RESTRICTIONS: No  FALLS: Has patient fallen in last 6 months? Yes. Number of falls 6-7  LIVING ENVIRONMENT: Lives with: lives with their family Lives in: House/apartment Stairs: Yes: External: 7 steps; none Has following equipment at home: Single point cane and Walker - 2 wheeled  PLOF: Independent with basic ADLs and has lessened his activity and uses his wife to assist with things  PATIENT GOALS: Improve balance, gait and mobility   OBJECTIVE:  Note: Objective measures were completed at Evaluation unless otherwise noted.  DIAGNOSTIC FINDINGS: n/a  COGNITION: Overall cognitive status: Within functional limits for tasks assessed   SENSATION: Not tested  LOWER EXTREMITY ROM:     Active  Right Eval Left Eval  Hip flexion    Hip extension    Hip abduction    Hip adduction    Hip internal rotation    Hip external rotation    Knee flexion    Knee extension 15 deg from full ext seated AROM    Ankle dorsiflexion    Ankle plantarflexion    Ankle inversion    Ankle eversion     (Blank rows = not tested)  LOWER EXTREMITY MMT:    MMT Right Eval Left Eval  Hip flexion 3+ 4  Hip extension    Hip abduction 4 4+  Hip adduction 4+ 4+  Hip internal rotation    Hip external rotation    Knee flexion 4 4+  Knee extension 4 4+  Ankle dorsiflexion 4+ 4+  Ankle plantarflexion    Ankle inversion    Ankle eversion    (Blank rows = not tested)  BED MOBILITY:  No reported issues with this  TRANSFERS: Sit to stand: Modified independence and CGA  Assistive device utilized: hands needed to complete     Stand to sit: Modified independence  Assistive device utilized: constols descent with hands         STAIRS: Pt reports using step to pattern with at least one fall on steps in past 6 months.  GAIT: Findings: Gait Characteristics: R knee does get to full extension, decreased step length- Right, decreased step length- Left, decreased stride length, decreased hip/knee flexion- Right, and decreased hip/knee flexion- Left, Distance walked: 30 ft, Level of assistance: Modified independence, and Comments: with turning around pt shows visible instability and mutiple small steps to keep balance, reaches for objects when near them in counter/furniture surfing style walking   FUNCTIONAL TESTS:   Orthostatic assessment: seated 142/76 HR 79 standing 136/73 HR 83  5 times sit to stand: 35.22 sec with heavyUE A Timed up and go (TUG): 19.88 sec  6 minute walk test: : test visit 2  10 meter walk test: .81 m/s BERG test visit 2   PATIENT SURVEYS:  ABC scale: The Activities-Specific Balance Confidence (ABC) Scale 0% 10 20 30  40 50 60 70 80 90 100% No confidence<->completely confident  "How confident are you that you will not lose your balance or become unsteady when you . . .   Date tested 09/12/23  Walk around the house 70%  2. Walk up or down stairs 60%  3. Bend over and pick up a slipper from in front of a closet floor 30%  4. Reach for a small can off a shelf at eye level 100%  5. Stand on tip toes  and reach for something above your head 80%  6. Stand on a chair and reach for something 0%  7. Sweep the floor 40%  8. Walk outside the house to a car parked in the driveway 80%  9. Get into or out of a car 90%  10. Walk across a parking lot to the mall 80%  11. Walk up or down a ramp 80%  12. Walk in a crowded mall where people rapidly walk past you 70%  13. Are bumped into by people as you walk through the mall 70%  14. Step onto or off of an escalator while you are holding onto the railing 60%  15. Step onto or off an escalator while holding onto parcels such that you cannot hold onto  the railing 60%  16. Walk outside on icy sidewalks 10%  Total: #/16 61.9%                                                                                                                                 TREATMENT DATE: 10/04/23   Self Care-Home Management:  Pt was introduced to an upright walker and therapist educated pt and spouse on use of the upright walker and it being a viable option for their cruise.  Pt would need a taller version in order for the pt to have more upright posture during gait.  Spouse spent time during the session looking up options online to potentially purchase.  Pt and spouse encouraged to bring it in to the clinic if possible for assessment and proper fitting.   Gait Training:  CGA assist utilized throughout the session unless otherwise indicated.  Ambulation in hallway and around gym with height adjusted upright walker, lap around the gym x2, verbal and visual cues given for proper form  Ambulation in hallway around the gym with 3# AW donned, pt notably having increased difficulty with the R LE scuffing at times, requiring frequent verbal cueing to prevent this from occurring    TherAct:  STS 2x10, use of CGA and airex pad under buttocks to assist with attempt due to pt's height making it more difficult to perform  Seated marches without back support in order to increase core activation, 2x10  Seated calf raises with foam roller under forefoot,  3# AW donned, 2x10 each LE  Seated toe raises with foam roller under heel, 3# AW donned, 2x10 each LE  All ankle exercises performed to improve endurance levels necessary for proper ambulation.  Pt currently having difficulty with dorsiflexion and is scuffing the floor when ambulating.   PATIENT EDUCATION: Education details: Pt educated throughout session about proper posture and technique with exercises. Improved exercise technique, movement at target joints, use of target muscles after min to mod verbal,  visual, tactile cues Person educated: Patient Education method: Explanation Education comprehension: verbalized understanding   HOME EXERCISE PROGRAM: Access Code: [email or web address] URL: [email or web address] Date: 09/25/2023 Prepared by: Lonni Gainer  Exercises - Marching Near Counter  - 1 x daily - 7 x weekly - 2 sets - 10 reps - Backward Walking with Counter Support  - 1 x daily - 7 x weekly - 2 sets   GOALS: Goals reviewed with patient? Yes  SHORT TERM GOALS: Target date: 10/10/2023  Patient will be independent in home exercise program to improve strength/mobility for better functional independence with ADLs. Baseline: No HEP currently  Goal status: INITIAL   LONG TERM GOALS: Target date: 12/05/2023  1.  Patient will complete five times sit to stand test in < 15 seconds indicating an increased LE strength and improved balance. Baseline: 35.22 sec  with heavy UE assist Goal status: INITIAL  2.  Patient will improve ABC score to 71.5%   to demonstrate statistically significant improvement in mobility and quality of life as it relates to their balance and mobility.  Baseline: 61.9% Goal status: INITIAL   3.  Patient will increase Berg Balance score by > 6 points to demonstrate decreased fall risk during functional activities. Baseline: test visit 2  Goal status: INITIAL   4.   Patient will reduce timed up and go to <11 seconds to reduce fall risk and demonstrate improved transfer/gait ability. Baseline: 19.88 sec Goal status: INITIAL  5.   Patient will increase 10 meter walk test to >1.21m/s as to improve gait speed for better community ambulation and to reduce fall risk. Baseline: .74m/s Goal status: INITIAL  6.   Patient will increase six minute walk test distance by greater than 150 ft for progression to community ambulator and improve gait ability Baseline: test visit 2  Goal status: INITIAL    ASSESSMENT:  CLINICAL IMPRESSION:  Pt responded  well to the exercises given today and put forth good effort throughout the session.  Pt felt more stable with the upright walker and felt this might be a good AD to take with him to their cruise.  Pt is making good progress with strengthening portion of the hips and LE's, however still lacks adequate strength in the ankles and would benefit from performing exercises that target the ankles, specifically dorsiflexion of the R ankle.  Will continue to monitor throughout subsequent sessions.   Pt will continue to benefit from skilled therapy to address remaining deficits in order to improve overall QoL and return to PLOF.      OBJECTIVE IMPAIRMENTS: Abnormal gait, decreased activity tolerance, decreased balance, decreased endurance, decreased mobility, difficulty walking, decreased strength, and pain.   ACTIVITY LIMITATIONS: standing, squatting, stairs, transfers, and locomotion level  PARTICIPATION LIMITATIONS: shopping, community activity, and yard work  PERSONAL FACTORS: Age, Behavior pattern, Time since onset of injury/illness/exacerbation, and 3+ comorbidities: DM, HTN, OA, HLD, OSA, R knee pain are also affecting patient's functional outcome.   REHAB POTENTIAL: Good  CLINICAL DECISION MAKING: Evolving/moderate complexity  EVALUATION COMPLEXITY: Moderate  PLAN:  PT FREQUENCY: 2x/week  PT DURATION: 12 weeks  PLANNED INTERVENTIONS: 97750- Physical Performance Testing, 97110-Therapeutic exercises, 97530- Therapeutic activity, [id number]- Neuromuscular re-education, 97535- Self Care, [id number]- Manual therapy, [phone]- Gait training, Balance training, Stair training, Joint mobilization, Cryotherapy, and Moist heat  PLAN FOR NEXT SESSION:   Introduce more ankle exercises. Continues with balance exercises Monitor symptoms with more strenuous exercises as this seems to be the reasoning behind his symptoms    Fonda Simpers, PT, DPT Physical Therapist - [facility]   10/04/23, 1:27 PM

## 2023-10-09 ENCOUNTER — Ambulatory Visit: Admitting: Neurosurgery

## 2023-10-09 ENCOUNTER — Ambulatory Visit: Admitting: Physical Therapy

## 2023-10-09 DIAGNOSIS — R262 Difficulty in walking, not elsewhere classified: Secondary | ICD-10-CM

## 2023-10-09 DIAGNOSIS — R269 Unspecified abnormalities of gait and mobility: Secondary | ICD-10-CM

## 2023-10-09 DIAGNOSIS — R2689 Other abnormalities of gait and mobility: Secondary | ICD-10-CM

## 2023-10-09 DIAGNOSIS — M6281 Muscle weakness (generalized): Secondary | ICD-10-CM | POA: Diagnosis not present

## 2023-10-09 DIAGNOSIS — R2681 Unsteadiness on feet: Secondary | ICD-10-CM

## 2023-10-09 NOTE — Therapy (Signed)
 OUTPATIENT PHYSICAL THERAPY NEURO TREATMENT   Patient Name: Vernon Payne. MRN: [id number] DOB:01/16/1951, 73 y.o., adult Today's Date: 10/09/2023   PCP: Cleotilde Oneil FALCON, MD  REFERRING PROVIDER: Maree Jannett POUR, MD  END OF SESSION:   Past Medical History:  Diagnosis Date   Actinic keratosis    Arthritis    Diabetes mellitus without complication (HCC)    GERD (gastroesophageal reflux disease)    Hard of hearing    left hearing aid   HLD (hyperlipidemia)    HTN (hypertension)    Hypotension, postural    IBS (irritable bowel syndrome)    Primary osteoarthritis of right knee    Sleep apnea    Squamous cell carcinoma of skin 01/20/2010   Right chest. Well differentiated. Excised: 01/27/2010   Squamous cell carcinoma of skin 07/15/2019   R preauricular ant to lobe   Stroke [facility])    May 31/ 2023   Wears dentures    partial upper, implants on bottom   Past Surgical History:  Procedure Laterality Date   APPENDECTOMY     BACK SURGERY     lumbar   CATARACT EXTRACTION W/PHACO Right 08/29/2017   Procedure: CATARACT EXTRACTION PHACO AND INTRAOCULAR LENS PLACEMENT (IOC) RIGHT DIABETIC;  Surgeon: Mittie Gaskin, MD;  Location: [facility];  Service: Ophthalmology;  Laterality: Right;  ISTENT INJECT   CATARACT EXTRACTION W/PHACO Left 11/21/2017   Procedure: CATARACT EXTRACTION PHACO AND INTRAOCULAR LENS PLACEMENT (IOC);  Surgeon: Mittie Gaskin, MD;  Location: [facility];  Service: Ophthalmology;  Laterality: Left;   COLONOSCOPY WITH PROPOFOL  N/A 01/26/2023   Procedure: COLONOSCOPY WITH PROPOFOL ;  Surgeon: Onita Elspeth Sharper, DO;  Location: [facility];  Service: Gastroenterology;  Laterality: N/A;   EYE SURGERY Left 11/21/2017   cataract excision   INSERTION OF ANTERIOR SEGMENT AQUEOUS DRAINAGE DEVICE (ISTENT) Right 08/29/2017   Procedure: (ISTENT);  Surgeon: Mittie Gaskin, MD;  Location: [facility];  Service: Ophthalmology;   Laterality: Right;  Diabetic - oral meds   INSERTION OF ANTERIOR SEGMENT AQUEOUS DRAINAGE DEVICE (ISTENT) Left 11/21/2017   Procedure: INSERTION OF ANTERIOR SEGMENT AQUEOUS DRAINAGE DEVICE (ISTENT)  INJECT DIABETES ISTENT INJECT;  Surgeon: Mittie Gaskin, MD;  Location: [facility];  Service: Ophthalmology;  Laterality: Left;  Diabetic - oral meds   JOINT REPLACEMENT     left knee replacement   KNEE ARTHROPLASTY Right 04/30/2023   Procedure: ARTHROPLASTY, KNEE, TOTAL, USING IMAGELESS COMPUTER-ASSISTED NAVIGATION;  Surgeon: Mardee Lynwood SQUIBB, MD;  Location: [facility] ORS;  Service: Orthopedics;  Laterality: Right;   KYPHOPLASTY N/A 03/28/2019   Procedure: L1 KYPHOPLASTY;  Surgeon: Kathlynn Sharper, MD;  Location: [facility];  Service: Orthopedics;  Laterality: N/A;   LASIK     POLYPECTOMY  01/26/2023   Procedure: POLYPECTOMY;  Surgeon: Onita Elspeth Sharper, DO;  Location: [facility];  Service: Gastroenterology;;   ROTATOR CUFF REPAIR     TONSILLECTOMY     Patient Active Problem List   Diagnosis Date Noted   SAH (subarachnoid hemorrhage) (HCC) 09/14/2023   SDH (subdural hematoma) (HCC) 09/14/2023   Fall 09/14/2023   Type 2 diabetes mellitus with peripheral neuropathy (HCC) 09/14/2023   Depression 09/14/2023   Focal seizure (HCC) 09/14/2023   History of total knee arthroplasty, right 04/30/2023   Acute CVA (cerebrovascular accident) (HCC)    Right hemiparesis (HCC) 07/20/2021   Closed compression fracture of body of L1 vertebra (HCC) 04/28/2019   Family history of colon cancer 03/26/2018   Primary osteoarthritis of right knee  09/04/2017   Primary osteoarthritis of left knee 09/04/2017   Status post left partial knee replacement 09/04/2017   OSA on CPAP 01/24/2017   Yellow jacket sting 10/31/2016   HTN (hypertension) 10/31/2016   Type 2 diabetes mellitus with hyperlipidemia (HCC) 10/31/2016   HLD (hyperlipidemia) 10/31/2016   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  Rationale for Evaluation and Treatment: Rehabilitation  SUBJECTIVE:                                                                                                                                                                                             SUBJECTIVE STATEMENT:  Pt reports head has been doing better, and MD is not concerned.  Pt ambulates into the clinic with RW but it's not able to get tall enough for him.    From Eval: Pt had knee replacement in the R side on April 30, 2023. Pt had first fall on 06/2023. Pt had surgery with Dr. Hooten. Pt reports he still has clicking in the right knee and has pain in the right knee that he reports as being worse since before the surgery. Pt reports he is going to see the doctor in a few months again regarding the right knee. Pt reports not walking as well as he used to and notices he moves to the left when he is ambulating. Pt also notes recent unintentional weight loss, has discussed it with MD per his report.   Pt accompanied by: significant other  PERTINENT HISTORY: Pt familiar to clinic previously for post stroke rehab in later half of 2023. Pt has hx of  OA, Diabetes mellitus type 2, controlled, with complications, GERD, Glaucoma   Hemorrhoids   Hyperlipidemia,Hypertension, OSA on CPAP   PAIN:  Are you having pain? Yes: NPRS scale: 7 Pain location: R knee Pain description: constant Aggravating factors: kneeling Relieving factors: n/a  PRECAUTIONS: Fall  RED FLAGS: None   WEIGHT BEARING RESTRICTIONS: No  FALLS: Has patient fallen in last 6 months? Yes. Number of falls 6-7  LIVING ENVIRONMENT: Lives with: lives with their family Lives in: House/apartment Stairs: Yes: External: 7 steps; none Has following equipment at home: Single point cane and Walker - 2 wheeled  PLOF: Independent  with basic ADLs and has lessened his activity and uses his wife to assist with things  PATIENT GOALS: Improve balance, gait and mobility   OBJECTIVE:  Note: Objective measures were completed at Evaluation unless otherwise noted.  DIAGNOSTIC FINDINGS: n/a  COGNITION: Overall cognitive status: Within functional limits for tasks assessed   SENSATION: Not tested  LOWER EXTREMITY ROM:     Active  Right Eval Left Eval  Hip flexion    Hip extension    Hip abduction    Hip adduction    Hip internal rotation    Hip external rotation    Knee flexion    Knee extension 15 deg from full ext seated AROM    Ankle dorsiflexion    Ankle plantarflexion    Ankle inversion    Ankle eversion     (Blank rows = not tested)  LOWER EXTREMITY MMT:    MMT Right Eval Left Eval  Hip flexion 3+ 4  Hip extension    Hip abduction 4 4+  Hip adduction 4+ 4+  Hip internal rotation    Hip external rotation    Knee flexion 4 4+  Knee extension 4 4+  Ankle dorsiflexion 4+ 4+  Ankle plantarflexion    Ankle inversion    Ankle eversion    (Blank rows = not tested)  BED MOBILITY:  No reported issues with this  TRANSFERS: Sit to stand: Modified independence and CGA  Assistive device utilized: hands needed to complete     Stand to sit: Modified independence  Assistive device utilized: constols descent with hands        STAIRS: Pt reports using step to pattern with at least one fall on steps in past 6 months.  GAIT: Findings: Gait Characteristics: R knee does get to full extension, decreased step length- Right, decreased step length- Left, decreased stride length, decreased hip/knee flexion- Right, and decreased hip/knee flexion- Left, Distance walked: 30 ft, Level of assistance: Modified independence, and Comments: with turning around pt shows visible instability and mutiple small steps to keep balance, reaches for objects when near them in counter/furniture surfing style walking   FUNCTIONAL  TESTS:   Orthostatic assessment: seated 142/76 HR 79 standing 136/73 HR 83  5 times sit to stand: 35.22 sec with heavyUE A Timed up and go (TUG): 19.88 sec  6 minute walk test: : test visit 2  10 meter walk test: .81 m/s BERG test visit 2   PATIENT SURVEYS:  ABC scale: The Activities-Specific Balance Confidence (ABC) Scale 0% 10 20 30  40 50 60 70 80 90 100% No confidence<->completely confident  "How confident are you that you will not lose your balance or become unsteady when you . . .   Date tested 09/12/23  Walk around the house 70%  2. Walk up or down stairs 60%  3. Bend over and pick up a slipper from in front of a closet floor 30%  4. Reach for a small can off a shelf at eye level 100%  5. Stand on tip toes and reach for something above your head 80%  6. Stand on a chair and reach for something 0%  7. Sweep the floor 40%  8. Walk outside the house to a car parked in the driveway 80%  9. Get into or out of a car 90%  10. Walk across a parking lot to the mall 80%  11. Walk up or down a ramp 80%  12. Walk in a crowded mall where people rapidly walk past you 70%  13. Are bumped into by people as you walk through the mall 70%  14. Step onto or off of an escalator while you are holding onto the railing 60%  15. Step  onto or off an escalator while holding onto parcels such that you cannot hold onto the railing 60%  16. Walk outside on icy sidewalks 10%  Total: #/16 61.9%                                                                                                                                 TREATMENT DATE: 10/09/23   Self Care-Home Management:  BP: 140/74  mmHg with HR 80  Pt had dried blood on leg from where his dog scratched him, initial part of visit PT uses alcohol wipes and PPE and band aids to clean scratch and seal wound and prevent further bleeding.   TherAct:  Nustep B UE and LE reciprocal movement training x 6 min  Gait no AD close SBA to treatment area    Seated LAQ 2 x 10 ea LE with 3# AW, limited end range extension   Standing marches with 3# AW to second step on stair case x 15 reps ea LE   Seated calf raises with foam roller under forefoot,  3# AW donned, 3x10 each LE  Seated toe raises with foam roller under heel, 3# AW donned, x 30  each LE  All ankle exercises performed to improve endurance levels necessary for proper ambulation.  Pt currently having difficulty with dorsiflexion and is scuffing the floor when ambulating.  TE- To improve strength, endurance, mobility, and function of specific targeted muscle groups or improve joint range of motion or improve muscle flexibility  Seated HS curls with cues for full available knee extension on return motion x 10 ea with GTB   PATIENT EDUCATION: Education details: Pt educated throughout session about proper posture and technique with exercises. Improved exercise technique, movement at target joints, use of target muscles after min to mod verbal, visual, tactile cues Person educated: Patient Education method: Explanation Education comprehension: verbalized understanding   HOME EXERCISE PROGRAM: Access Code: [email or web address] URL: [email or web address] Date: 09/25/2023 Prepared by: Lonni Gainer  Exercises - Marching Near Counter  - 1 x daily - 7 x weekly - 2 sets - 10 reps - Backward Walking with Counter Support  - 1 x daily - 7 x weekly - 2 sets   GOALS: Goals reviewed with patient? Yes  SHORT TERM GOALS: Target date: 10/10/2023  Patient will be independent in home exercise program to improve strength/mobility for better functional independence with ADLs. Baseline: No HEP currently  Goal status: INITIAL   LONG TERM GOALS: Target date: 12/05/2023  1.  Patient will complete five times sit to stand test in < 15 seconds indicating an increased LE strength and improved balance. Baseline: 35.22 sec  with heavy UE assist Goal status: INITIAL  2.  Patient will  improve ABC score to 71.5%   to demonstrate statistically significant improvement in mobility and quality of life as it relates to their balance and mobility.  Baseline: 61.9% Goal status: INITIAL   3.  Patient will increase Berg Balance score by > 6 points to demonstrate decreased fall risk during functional activities. Baseline: test visit 2  Goal status: INITIAL   4.   Patient will reduce timed up and go to <11 seconds to reduce fall risk and demonstrate improved transfer/gait ability. Baseline: 19.88 sec Goal status: INITIAL  5.   Patient will increase 10 meter walk test to >1.62m/s as to improve gait speed for better community ambulation and to reduce fall risk. Baseline: .6m/s Goal status: INITIAL  6.   Patient will increase six minute walk test distance by greater than 150 ft for progression to community ambulator and improve gait ability Baseline: test visit 2  Goal status: INITIAL    ASSESSMENT:  CLINICAL IMPRESSION:  Pt responded well to the exercises given today and put forth good effort throughout the session.  Pt BP within normal limits this date but pt still having pain on L side of his head. Pt completes primarily seated exercises this date due to continued unsteadiness.   Pt will continue to benefit from skilled therapy to address remaining deficits in order to improve overall QoL and return to PLOF.      OBJECTIVE IMPAIRMENTS: Abnormal gait, decreased activity tolerance, decreased balance, decreased endurance, decreased mobility, difficulty walking, decreased strength, and pain.   ACTIVITY LIMITATIONS: standing, squatting, stairs, transfers, and locomotion level  PARTICIPATION LIMITATIONS: shopping, community activity, and yard work  PERSONAL FACTORS: Age, Behavior pattern, Time since onset of injury/illness/exacerbation, and 3+ comorbidities: DM, HTN, OA, HLD, OSA, R knee pain are also affecting patient's functional outcome.   REHAB POTENTIAL:  Good  CLINICAL DECISION MAKING: Evolving/moderate complexity  EVALUATION COMPLEXITY: Moderate  PLAN:  PT FREQUENCY: 2x/week  PT DURATION: 12 weeks  PLANNED INTERVENTIONS: [id number]- Physical Performance Testing, 97110-Therapeutic exercises, 97530- Therapeutic activity, [id number]- Neuromuscular re-education, 97535- Self Care, [id number]- Manual therapy, [phone]- Gait training, Balance training, Stair training, Joint mobilization, Cryotherapy, and Moist heat  PLAN FOR NEXT SESSION:   Introduce more ankle exercises. Continues with balance exercises Monitor symptoms with more strenuous exercises as this seems to be the reasoning behind his symptoms    Note: Portions of this document were prepared using Dragon voice recognition software and although reviewed may contain unintentional dictation errors in syntax, grammar, or spelling.  Lonni KATHEE Gainer PT ,DPT Physical Therapist- [facility]   10/09/23, 9:00 AM

## 2023-10-11 ENCOUNTER — Ambulatory Visit

## 2023-10-11 DIAGNOSIS — R262 Difficulty in walking, not elsewhere classified: Secondary | ICD-10-CM

## 2023-10-11 DIAGNOSIS — M6281 Muscle weakness (generalized): Secondary | ICD-10-CM | POA: Diagnosis not present

## 2023-10-11 DIAGNOSIS — G3184 Mild cognitive impairment, so stated: Secondary | ICD-10-CM

## 2023-10-11 DIAGNOSIS — R2689 Other abnormalities of gait and mobility: Secondary | ICD-10-CM

## 2023-10-11 DIAGNOSIS — R2681 Unsteadiness on feet: Secondary | ICD-10-CM

## 2023-10-11 DIAGNOSIS — R41841 Cognitive communication deficit: Secondary | ICD-10-CM

## 2023-10-11 DIAGNOSIS — R269 Unspecified abnormalities of gait and mobility: Secondary | ICD-10-CM

## 2023-10-11 DIAGNOSIS — I639 Cerebral infarction, unspecified: Secondary | ICD-10-CM

## 2023-10-11 NOTE — Therapy (Signed)
 OUTPATIENT PHYSICAL THERAPY NEURO TREATMENT   Patient Name: Vernon Payne. MRN: [id number] DOB:[birth year], 73 y.o., adult Today's Date: 10/11/2023   PCP: Cleotilde Oneil FALCON, MD  REFERRING PROVIDER: Maree Jannett POUR, MD  END OF SESSION:  PT End of Session - 10/11/23 1150     Visit Number 6    Number of Visits 24    Date for PT Re-Evaluation 12/05/23    Progress Note Due on Visit 10    PT Start Time 1150    PT Stop Time 1230    PT Time Calculation (min) 40 min    Equipment Utilized During Treatment Gait belt    Activity Tolerance Patient tolerated treatment well    Behavior During Therapy WFL for tasks assessed/performed          Past Medical History:  Diagnosis Date   Actinic keratosis    Arthritis    Diabetes mellitus without complication (HCC)    GERD (gastroesophageal reflux disease)    Hard of hearing    left hearing aid   HLD (hyperlipidemia)    HTN (hypertension)    Hypotension, postural    IBS (irritable bowel syndrome)    Primary osteoarthritis of right knee    Sleep apnea    Squamous cell carcinoma of skin 01/20/2010   Right chest. Well differentiated. Excised: 01/27/2010   Squamous cell carcinoma of skin 07/15/2019   R preauricular ant to lobe   Stroke [facility])    May 31/ 2023   Wears dentures    partial upper, implants on bottom   Past Surgical History:  Procedure Laterality Date   APPENDECTOMY     BACK SURGERY     lumbar   CATARACT EXTRACTION W/PHACO Right 08/29/2017   Procedure: CATARACT EXTRACTION PHACO AND INTRAOCULAR LENS PLACEMENT (IOC) RIGHT DIABETIC;  Surgeon: Mittie Gaskin, MD;  Location: [facility];  Service: Ophthalmology;  Laterality: Right;  ISTENT INJECT   CATARACT EXTRACTION W/PHACO Left 11/21/2017   Procedure: CATARACT EXTRACTION PHACO AND INTRAOCULAR LENS PLACEMENT (IOC);  Surgeon: Mittie Gaskin, MD;  Location: [facility];  Service: Ophthalmology;  Laterality: Left;   COLONOSCOPY WITH PROPOFOL  N/A  01/26/2023   Procedure: COLONOSCOPY WITH PROPOFOL ;  Surgeon: Onita Elspeth Sharper, DO;  Location: [facility] ENDOSCOPY;  Service: Gastroenterology;  Laterality: N/A;   EYE SURGERY Left 11/21/2017   cataract excision   INSERTION OF ANTERIOR SEGMENT AQUEOUS DRAINAGE DEVICE (ISTENT) Right 08/29/2017   Procedure: (ISTENT);  Surgeon: Mittie Gaskin, MD;  Location: [facility];  Service: Ophthalmology;  Laterality: Right;  Diabetic - oral meds   INSERTION OF ANTERIOR SEGMENT AQUEOUS DRAINAGE DEVICE (ISTENT) Left 11/21/2017   Procedure: INSERTION OF ANTERIOR SEGMENT AQUEOUS DRAINAGE DEVICE (ISTENT)  INJECT DIABETES ISTENT INJECT;  Surgeon: Mittie Gaskin, MD;  Location: [facility];  Service: Ophthalmology;  Laterality: Left;  Diabetic - oral meds   JOINT REPLACEMENT     left knee replacement   KNEE ARTHROPLASTY Right 04/30/2023   Procedure: ARTHROPLASTY, KNEE, TOTAL, USING IMAGELESS COMPUTER-ASSISTED NAVIGATION;  Surgeon: Mardee Lynwood SQUIBB, MD;  Location: [facility] ORS;  Service: Orthopedics;  Laterality: Right;   KYPHOPLASTY N/A 03/28/2019   Procedure: L1 KYPHOPLASTY;  Surgeon: Kathlynn Sharper, MD;  Location: [facility] ORS;  Service: Orthopedics;  Laterality: N/A;   LASIK     POLYPECTOMY  01/26/2023   Procedure: POLYPECTOMY;  Surgeon: Onita Elspeth Sharper, DO;  Location: [facility] ENDOSCOPY;  Service: Gastroenterology;;   MARCELINE CUFF REPAIR     TONSILLECTOMY     Patient  Active Problem List   Diagnosis Date Noted   SAH (subarachnoid hemorrhage) (HCC) 09/14/2023   SDH (subdural hematoma) (HCC) 09/14/2023   Fall 09/14/2023   Type 2 diabetes mellitus with peripheral neuropathy (HCC) 09/14/2023   Depression 09/14/2023   Focal seizure (HCC) 09/14/2023   History of total knee arthroplasty, right 04/30/2023   Acute CVA (cerebrovascular accident) (HCC)    Right hemiparesis (HCC) 07/20/2021   Closed compression fracture of body of L1 vertebra (HCC) 04/28/2019   Family history of colon cancer  03/26/2018   Primary osteoarthritis of right knee 09/04/2017   Primary osteoarthritis of left knee 09/04/2017   Status post left partial knee replacement 09/04/2017   OSA on CPAP 01/24/2017   Yellow jacket sting 10/31/2016   HTN (hypertension) 10/31/2016   Type 2 diabetes mellitus with hyperlipidemia (HCC) 10/31/2016   HLD (hyperlipidemia) 10/31/2016   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   Benign essential HTN 10/15/2013    ONSET DATE: 04/30/23  REFERRING DIAG: R26.89 (ICD-10-CM) - Imbalance   THERAPY DIAG:  Muscle weakness (generalized)  Difficulty in walking, not elsewhere classified  Abnormality of gait and mobility  Other abnormalities of gait and mobility  Unsteadiness on feet  Acute CVA (cerebrovascular accident) (HCC)  Cognitive communication deficit  Mild cognitive impairment  Rationale for Evaluation and Treatment: Rehabilitation  SUBJECTIVE:                                                                                                                                                                                             SUBJECTIVE STATEMENT:  Pt much more energetic today and is in good spirits.  Pt reporting mild frontal pain upon arrival.  From Eval: Pt had knee replacement in the R side on April 30, 2023. Pt had first fall on 06/2023. Pt had surgery with Dr. Hooten. Pt reports he still has clicking in the right knee and has pain in the right knee that he reports as being worse since before the surgery. Pt reports he is going to see the doctor in a few months again regarding the right knee. Pt reports not walking as well as he used to and notices he moves to the left when he is ambulating. Pt also notes recent unintentional weight loss, has discussed it with MD per his report.   Pt accompanied by: significant other  PERTINENT HISTORY: Pt familiar to clinic previously for post stroke rehab in later half of 2023. Pt has hx of  OA, Diabetes  mellitus type 2, controlled, with complications, GERD, Glaucoma   Hemorrhoids   Hyperlipidemia,Hypertension, OSA on CPAP   PAIN:  Are you having pain? Yes: NPRS scale: 7 Pain location: R knee Pain description: constant Aggravating factors: kneeling Relieving factors: n/a  PRECAUTIONS: Fall  RED FLAGS: None   WEIGHT BEARING RESTRICTIONS: No  FALLS: Has patient fallen in last 6 months? Yes. Number of falls 6-7  LIVING ENVIRONMENT: Lives with: lives with their family Lives in: House/apartment Stairs: Yes: External: 7 steps; none Has following equipment at home: Single point cane and Walker - 2 wheeled  PLOF: Independent with basic ADLs and has lessened his activity and uses his wife to assist with things  PATIENT GOALS: Improve balance, gait and mobility   OBJECTIVE:  Note: Objective measures were completed at Evaluation unless otherwise noted.  DIAGNOSTIC FINDINGS: n/a  COGNITION: Overall cognitive status: Within functional limits for tasks assessed   SENSATION: Not tested  LOWER EXTREMITY ROM:     Active  Right Eval Left Eval  Hip flexion    Hip extension    Hip abduction    Hip adduction    Hip internal rotation    Hip external rotation    Knee flexion    Knee extension 15 deg from full ext seated AROM    Ankle dorsiflexion    Ankle plantarflexion    Ankle inversion    Ankle eversion     (Blank rows = not tested)  LOWER EXTREMITY MMT:    MMT Right Eval Left Eval  Hip flexion 3+ 4  Hip extension    Hip abduction 4 4+  Hip adduction 4+ 4+  Hip internal rotation    Hip external rotation    Knee flexion 4 4+  Knee extension 4 4+  Ankle dorsiflexion 4+ 4+  Ankle plantarflexion    Ankle inversion    Ankle eversion    (Blank rows = not tested)  BED MOBILITY:  No reported issues with this  TRANSFERS: Sit to stand: Modified independence and CGA  Assistive device utilized: hands needed to complete     Stand to sit: Modified independence   Assistive device utilized: constols descent with hands        STAIRS: Pt reports using step to pattern with at least one fall on steps in past 6 months.  GAIT: Findings: Gait Characteristics: R knee does get to full extension, decreased step length- Right, decreased step length- Left, decreased stride length, decreased hip/knee flexion- Right, and decreased hip/knee flexion- Left, Distance walked: 30 ft, Level of assistance: Modified independence, and Comments: with turning around pt shows visible instability and mutiple small steps to keep balance, reaches for objects when near them in counter/furniture surfing style walking   FUNCTIONAL TESTS:   Orthostatic assessment: seated 142/76 HR 79 standing 136/73 HR 83  5 times sit to stand: 35.22 sec with heavyUE A Timed up and go (TUG): 19.88 sec  6 minute walk test: : test visit 2  10 meter walk test: .81 m/s BERG test visit 2   PATIENT SURVEYS:  ABC scale: The Activities-Specific Balance Confidence (ABC) Scale 0% 10 20 30  40 50 60 70 80 90 100% No confidence<->completely confident  "How confident are you that you will not lose your balance or become unsteady when you . . .   Date tested 09/12/23  Walk around the house 70%  2. Walk up or down stairs 60%  3. Bend over and pick up a slipper from in front of a closet floor 30%  4. Reach for a small can off a shelf at eye level 100%  5.  Stand on tip toes and reach for something above your head 80%  6. Stand on a chair and reach for something 0%  7. Sweep the floor 40%  8. Walk outside the house to a car parked in the driveway 80%  9. Get into or out of a car 90%  10. Walk across a parking lot to the mall 80%  11. Walk up or down a ramp 80%  12. Walk in a crowded mall where people rapidly walk past you 70%  13. Are bumped into by people as you walk through the mall 70%  14. Step onto or off of an escalator while you are holding onto the railing 60%  15. Step onto or off an escalator  while holding onto parcels such that you cannot hold onto the railing 60%  16. Walk outside on icy sidewalks 10%  Total: #/16 61.9%                                                                                                                                 TREATMENT DATE: 10/11/23    Self Care-Home Management:  BP: 142/73 mmHg  HR: 81 bpm   TherAct:  Gait with no AD and verbal and visual demonstration in dorsiflexion of the L LE, down to the cancer center and back, one standing rest break due to the pain in the R knee    TE- To improve strength, endurance, mobility, and function of specific targeted muscle groups or improve joint range of motion or improve muscle flexibility  Seated HS curls with cues for full available knee extension on return motion x10 ea with GTB   Seated LAQ 2 x 10 ea LE with 3# AW, limited end range extension   Seated marches with 3# AW, 2x10 each LE  Seated calf raises with foam roller under forefoot,  3# AW donned, 3x10 each LE  Seated toe raises with foam roller under heel, 3# AW donned, x 30  each LE   PATIENT EDUCATION: Education details: Pt educated throughout session about proper posture and technique with exercises. Improved exercise technique, movement at target joints, use of target muscles after min to mod verbal, visual, tactile cues Person educated: Patient Education method: Explanation Education comprehension: verbalized understanding   HOME EXERCISE PROGRAM: Access Code: [email or web address] URL: [email or web address] Date: 09/25/2023 Prepared by: Lonni Gainer  Exercises - Marching Near Counter  - 1 x daily - 7 x weekly - 2 sets - 10 reps - Backward Walking with Counter Support  - 1 x daily - 7 x weekly - 2 sets   GOALS: Goals reviewed with patient? Yes  SHORT TERM GOALS: Target date: 10/10/2023  Patient will be independent in home exercise program to improve strength/mobility for better functional independence  with ADLs. Baseline: No HEP currently  Goal status: INITIAL   LONG TERM GOALS: Target date: 12/05/2023  1.  Patient will complete five times sit to stand  test in < 15 seconds indicating an increased LE strength and improved balance. Baseline: 35.22 sec  with heavy UE assist Goal status: INITIAL  2.  Patient will improve ABC score to 71.5%   to demonstrate statistically significant improvement in mobility and quality of life as it relates to their balance and mobility.  Baseline: 61.9% Goal status: INITIAL   3.  Patient will increase Berg Balance score by > 6 points to demonstrate decreased fall risk during functional activities. Baseline: test visit 2  Goal status: INITIAL   4.   Patient will reduce timed up and go to <11 seconds to reduce fall risk and demonstrate improved transfer/gait ability. Baseline: 19.88 sec Goal status: INITIAL  5.   Patient will increase 10 meter walk test to >1.78m/s as to improve gait speed for better community ambulation and to reduce fall risk. Baseline: .3m/s Goal status: INITIAL  6.   Patient will increase six minute walk test distance by greater than 150 ft for progression to community ambulator and improve gait ability Baseline: test visit 2  Goal status: INITIAL    ASSESSMENT:  CLINICAL IMPRESSION:  Pt put forth great effort and was much more engaged during the session.  Pt was able to ambulate down to the cancer center with a few (2-3) stumbles on the way to and from the center.  Pt able to self-correct, but most notably due to crossing over while ambulating and having a narrow BOS when doing so.  Pt ultimately is making steady improvements and will continue to benefit from endurance training.   Pt will continue to benefit from skilled therapy to address remaining deficits in order to improve overall QoL and return to PLOF.       OBJECTIVE IMPAIRMENTS: Abnormal gait, decreased activity tolerance, decreased balance, decreased endurance,  decreased mobility, difficulty walking, decreased strength, and pain.   ACTIVITY LIMITATIONS: standing, squatting, stairs, transfers, and locomotion level  PARTICIPATION LIMITATIONS: shopping, community activity, and yard work  PERSONAL FACTORS: Age, Behavior pattern, Time since onset of injury/illness/exacerbation, and 3+ comorbidities: DM, HTN, OA, HLD, OSA, R knee pain are also affecting patient's functional outcome.   REHAB POTENTIAL: Good  CLINICAL DECISION MAKING: Evolving/moderate complexity  EVALUATION COMPLEXITY: Moderate  PLAN:  PT FREQUENCY: 2x/week  PT DURATION: 12 weeks  PLANNED INTERVENTIONS: [id number]- Physical Performance Testing, 97110-Therapeutic exercises, 97530- Therapeutic activity, [id number]- Neuromuscular re-education, 97535- Self Care, [id number]- Manual therapy, [phone]- Gait training, Balance training, Stair training, Joint mobilization, Cryotherapy, and Moist heat  PLAN FOR NEXT SESSION:   Introduce more ankle exercises. Continues with balance exercises Monitor symptoms with more strenuous exercises as this seems to be the reasoning behind his symptoms   Fonda Simpers, PT, DPT Physical Therapist - [facility]  10/11/23, 10:23 PM

## 2023-10-16 ENCOUNTER — Ambulatory Visit: Admitting: Physical Therapy

## 2023-10-16 DIAGNOSIS — R2681 Unsteadiness on feet: Secondary | ICD-10-CM

## 2023-10-16 DIAGNOSIS — M6281 Muscle weakness (generalized): Secondary | ICD-10-CM

## 2023-10-16 DIAGNOSIS — R262 Difficulty in walking, not elsewhere classified: Secondary | ICD-10-CM

## 2023-10-16 DIAGNOSIS — R269 Unspecified abnormalities of gait and mobility: Secondary | ICD-10-CM

## 2023-10-16 DIAGNOSIS — R2689 Other abnormalities of gait and mobility: Secondary | ICD-10-CM

## 2023-10-16 NOTE — Therapy (Unsigned)
 OUTPATIENT PHYSICAL THERAPY NEURO TREATMENT   Patient Name: Vernon Payne. MRN: [id number] DOB:Oct 13, 1950, 73 y.o., adult Today's Date: 10/16/2023   PCP: Cleotilde Oneil FALCON, MD  REFERRING PROVIDER: Maree Jannett POUR, MD  END OF SESSION:  PT End of Session - 10/16/23 1539     Visit Number 7    Number of Visits 24    Date for PT Re-Evaluation 12/05/23    Progress Note Due on Visit 10    PT Start Time 1530    PT Stop Time 1613    PT Time Calculation (min) 43 min    Equipment Utilized During Treatment Gait belt    Activity Tolerance Patient tolerated treatment well    Behavior During Therapy WFL for tasks assessed/performed          Past Medical History:  Diagnosis Date   Actinic keratosis    Arthritis    Diabetes mellitus without complication (HCC)    GERD (gastroesophageal reflux disease)    Hard of hearing    left hearing aid   HLD (hyperlipidemia)    HTN (hypertension)    Hypotension, postural    IBS (irritable bowel syndrome)    Primary osteoarthritis of right knee    Sleep apnea    Squamous cell carcinoma of skin 01/20/2010   Right chest. Well differentiated. Excised: 01/27/2010   Squamous cell carcinoma of skin 07/15/2019   R preauricular ant to lobe   Stroke [facility])    May 31/ 2023   Wears dentures    partial upper, implants on bottom   Past Surgical History:  Procedure Laterality Date   APPENDECTOMY     BACK SURGERY     lumbar   CATARACT EXTRACTION W/PHACO Right 08/29/2017   Procedure: CATARACT EXTRACTION PHACO AND INTRAOCULAR LENS PLACEMENT (IOC) RIGHT DIABETIC;  Surgeon: Mittie Gaskin, MD;  Location: [facility];  Service: Ophthalmology;  Laterality: Right;  ISTENT INJECT   CATARACT EXTRACTION W/PHACO Left 11/21/2017   Procedure: CATARACT EXTRACTION PHACO AND INTRAOCULAR LENS PLACEMENT (IOC);  Surgeon: Mittie Gaskin, MD;  Location: [facility];  Service: Ophthalmology;  Laterality: Left;   COLONOSCOPY WITH PROPOFOL  N/A  01/26/2023   Procedure: COLONOSCOPY WITH PROPOFOL ;  Surgeon: Onita Elspeth Sharper, DO;  Location: [facility] ENDOSCOPY;  Service: Gastroenterology;  Laterality: N/A;   EYE SURGERY Left 11/21/2017   cataract excision   INSERTION OF ANTERIOR SEGMENT AQUEOUS DRAINAGE DEVICE (ISTENT) Right 08/29/2017   Procedure: (ISTENT);  Surgeon: Mittie Gaskin, MD;  Location: [facility];  Service: Ophthalmology;  Laterality: Right;  Diabetic - oral meds   INSERTION OF ANTERIOR SEGMENT AQUEOUS DRAINAGE DEVICE (ISTENT) Left 11/21/2017   Procedure: INSERTION OF ANTERIOR SEGMENT AQUEOUS DRAINAGE DEVICE (ISTENT)  INJECT DIABETES ISTENT INJECT;  Surgeon: Mittie Gaskin, MD;  Location: [facility];  Service: Ophthalmology;  Laterality: Left;  Diabetic - oral meds   JOINT REPLACEMENT     left knee replacement   KNEE ARTHROPLASTY Right 04/30/2023   Procedure: ARTHROPLASTY, KNEE, TOTAL, USING IMAGELESS COMPUTER-ASSISTED NAVIGATION;  Surgeon: Mardee Lynwood SQUIBB, MD;  Location: [facility] ORS;  Service: Orthopedics;  Laterality: Right;   KYPHOPLASTY N/A 03/28/2019   Procedure: L1 KYPHOPLASTY;  Surgeon: Kathlynn Sharper, MD;  Location: [facility] ORS;  Service: Orthopedics;  Laterality: N/A;   LASIK     POLYPECTOMY  01/26/2023   Procedure: POLYPECTOMY;  Surgeon: Onita Elspeth Sharper, DO;  Location: [facility] ENDOSCOPY;  Service: Gastroenterology;;   MARCELINE CUFF REPAIR     TONSILLECTOMY     Patient  Active Problem List   Diagnosis Date Noted   SAH (subarachnoid hemorrhage) (HCC) 09/14/2023   SDH (subdural hematoma) (HCC) 09/14/2023   Fall 09/14/2023   Type 2 diabetes mellitus with peripheral neuropathy (HCC) 09/14/2023   Depression 09/14/2023   Focal seizure (HCC) 09/14/2023   History of total knee arthroplasty, right 04/30/2023   Acute CVA (cerebrovascular accident) (HCC)    Right hemiparesis (HCC) 07/20/2021   Closed compression fracture of body of L1 vertebra (HCC) 04/28/2019   Family history of colon cancer  03/26/2018   Primary osteoarthritis of right knee 09/04/2017   Primary osteoarthritis of left knee 09/04/2017   Status post left partial knee replacement 09/04/2017   OSA on CPAP 01/24/2017   Yellow jacket sting 10/31/2016   HTN (hypertension) 10/31/2016   Type 2 diabetes mellitus with hyperlipidemia (HCC) 10/31/2016   HLD (hyperlipidemia) 10/31/2016   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  Rationale for Evaluation and Treatment: Rehabilitation  SUBJECTIVE:                                                                                                                                                                                             SUBJECTIVE STATEMENT:  Pt much more energetic today and is in good spirits.  Pt reporting mild frontal pain upon arrival.  From Eval: Pt had knee replacement in the R side on April 30, 2023. Pt had first fall on 06/2023. Pt had surgery with Dr. Hooten. Pt reports he still has clicking in the right knee and has pain in the right knee that he reports as being worse since before the surgery. Pt reports he is going to see the doctor in a few months again regarding the right knee. Pt reports not walking as well as he used to and notices he moves to the left when he is ambulating. Pt also notes recent unintentional weight loss, has discussed it with MD per his report.   Pt accompanied by: significant other  PERTINENT HISTORY: Pt familiar to clinic previously for post stroke rehab in later half of 2023. Pt has hx of  OA, Diabetes mellitus type 2, controlled, with complications, GERD, Glaucoma   Hemorrhoids   Hyperlipidemia,Hypertension, OSA on CPAP   PAIN:  Are you having pain? Yes: NPRS scale: 7 Pain location: R knee Pain description: constant Aggravating factors: kneeling Relieving factors:  n/a  PRECAUTIONS: Fall  RED FLAGS: None   WEIGHT BEARING RESTRICTIONS: No  FALLS: Has patient  fallen in last 6 months? Yes. Number of falls 6-7  LIVING ENVIRONMENT: Lives with: lives with their family Lives in: House/apartment Stairs: Yes: External: 7 steps; none Has following equipment at home: Single point cane and Walker - 2 wheeled  PLOF: Independent with basic ADLs and has lessened his activity and uses his wife to assist with things  PATIENT GOALS: Improve balance, gait and mobility   OBJECTIVE:  Note: Objective measures were completed at Evaluation unless otherwise noted.  DIAGNOSTIC FINDINGS: n/a  COGNITION: Overall cognitive status: Within functional limits for tasks assessed   SENSATION: Not tested  LOWER EXTREMITY ROM:     Active  Right Eval Left Eval  Hip flexion    Hip extension    Hip abduction    Hip adduction    Hip internal rotation    Hip external rotation    Knee flexion    Knee extension 15 deg from full ext seated AROM    Ankle dorsiflexion    Ankle plantarflexion    Ankle inversion    Ankle eversion     (Blank rows = not tested)  LOWER EXTREMITY MMT:    MMT Right Eval Left Eval  Hip flexion 3+ 4  Hip extension    Hip abduction 4 4+  Hip adduction 4+ 4+  Hip internal rotation    Hip external rotation    Knee flexion 4 4+  Knee extension 4 4+  Ankle dorsiflexion 4+ 4+  Ankle plantarflexion    Ankle inversion    Ankle eversion    (Blank rows = not tested)  BED MOBILITY:  No reported issues with this  TRANSFERS: Sit to stand: Modified independence and CGA  Assistive device utilized: hands needed to complete     Stand to sit: Modified independence  Assistive device utilized: constols descent with hands        STAIRS: Pt reports using step to pattern with at least one fall on steps in past 6 months.  GAIT: Findings: Gait Characteristics: R knee does get to full extension, decreased step length- Right, decreased step  length- Left, decreased stride length, decreased hip/knee flexion- Right, and decreased hip/knee flexion- Left, Distance walked: 30 ft, Level of assistance: Modified independence, and Comments: with turning around pt shows visible instability and mutiple small steps to keep balance, reaches for objects when near them in counter/furniture surfing style walking   FUNCTIONAL TESTS:   Orthostatic assessment: seated 142/76 HR 79 standing 136/73 HR 83  5 times sit to stand: 35.22 sec with heavyUE A Timed up and go (TUG): 19.88 sec  6 minute walk test: : test visit 2  10 meter walk test: .81 m/s BERG test visit 2   PATIENT SURVEYS:  ABC scale: The Activities-Specific Balance Confidence (ABC) Scale 0% 10 20 30  40 50 60 70 80 90 100% No confidence<->completely confident  "How confident are you that you will not lose your balance or become unsteady when you . . .   Date tested 09/12/23  Walk around the house 70%  2. Walk up or down stairs 60%  3. Bend over and pick up a slipper from in front of a closet floor 30%  4. Reach for a small can off a shelf at eye level 100%  5. Stand on tip toes and reach for something above your head 80%  6. Stand on a chair and reach for something 0%  7. Sweep the floor 40%  8. Walk outside the house to a car  parked in the driveway 80%  9. Get into or out of a car 90%  10. Walk across a parking lot to the mall 80%  11. Walk up or down a ramp 80%  12. Walk in a crowded mall where people rapidly walk past you 70%  13. Are bumped into by people as you walk through the mall 70%  14. Step onto or off of an escalator while you are holding onto the railing 60%  15. Step onto or off an escalator while holding onto parcels such that you cannot hold onto the railing 60%  16. Walk outside on icy sidewalks 10%  Total: #/16 61.9%                                                                                                                                 TREATMENT  DATE: 10/16/23    TA- To improve functional movements patterns for everyday tasks   Nustep B UE and LE reciprocal movement training x 6 min level 3   STS 2 x 5 reps no UE assist, min A at times for initial propulsion but improved with reps   Standing heel toe raises with UE support 3 x 10 reps   TE- To improve strength, endurance, mobility, and function of specific targeted muscle groups or improve joint range of motion or improve muscle flexibility  Seated HS curls with cues for full available knee extension on return motion x10 ea with GTB   Seated LAQ  x 10 ea LE with 5# AW, limited end range extension   Seated marches with 5# AW, x10 each LE  NMR: To facilitate reeducation of movement, balance, posture, coordination, and/or proprioception/kinesthetic sense.  1 LE on step, other on floor 2 x 30 sec ea   PATIENT EDUCATION: Education details: Pt educated throughout session about proper posture and technique with exercises. Improved exercise technique, movement at target joints, use of target muscles after min to mod verbal, visual, tactile cues Person educated: Patient Education method: Explanation Education comprehension: verbalized understanding   HOME EXERCISE PROGRAM: Access Code: [email or web address] URL: [email or web address] Date: 09/25/2023 Prepared by: Lonni Gainer  Exercises - Marching Near Counter  - 1 x daily - 7 x weekly - 2 sets - 10 reps - Backward Walking with Counter Support  - 1 x daily - 7 x weekly - 2 sets   GOALS: Goals reviewed with patient? Yes  SHORT TERM GOALS: Target date: 10/10/2023  Patient will be independent in home exercise program to improve strength/mobility for better functional independence with ADLs. Baseline: No HEP currently  Goal status: INITIAL   LONG TERM GOALS: Target date: 12/05/2023  1.  Patient will complete five times sit to stand test in < 15 seconds indicating an increased LE strength and improved  balance. Baseline: 35.22 sec  with heavy UE assist Goal status: INITIAL  2.  Patient will improve ABC score to 71.5%   to  demonstrate statistically significant improvement in mobility and quality of life as it relates to their balance and mobility.  Baseline: 61.9% Goal status: INITIAL   3.  Patient will increase Berg Balance score by > 6 points to demonstrate decreased fall risk during functional activities. Baseline: test visit 2  Goal status: INITIAL   4.   Patient will reduce timed up and go to <11 seconds to reduce fall risk and demonstrate improved transfer/gait ability. Baseline: 19.88 sec Goal status: INITIAL  5.   Patient will increase 10 meter walk test to >1.44m/s as to improve gait speed for better community ambulation and to reduce fall risk. Baseline: .58m/s Goal status: INITIAL  6.   Patient will increase six minute walk test distance by greater than 150 ft for progression to community ambulator and improve gait ability Baseline: test visit 2  Goal status: INITIAL    ASSESSMENT:  CLINICAL IMPRESSION:  Pt put forth great effort and was much more engaged during the session.  Pt was able to ambulate down to the cancer center with a few (2-3) stumbles on the way to and from the center.  Pt able to self-correct, but most notably due to crossing over while ambulating and having a narrow BOS when doing so.  Pt ultimately is making steady improvements and will continue to benefit from endurance training.   Pt will continue to benefit from skilled therapy to address remaining deficits in order to improve overall QoL and return to PLOF.       OBJECTIVE IMPAIRMENTS: Abnormal gait, decreased activity tolerance, decreased balance, decreased endurance, decreased mobility, difficulty walking, decreased strength, and pain.   ACTIVITY LIMITATIONS: standing, squatting, stairs, transfers, and locomotion level  PARTICIPATION LIMITATIONS: shopping, community activity, and yard  work  PERSONAL FACTORS: Age, Behavior pattern, Time since onset of injury/illness/exacerbation, and 3+ comorbidities: DM, HTN, OA, HLD, OSA, R knee pain are also affecting patient's functional outcome.   REHAB POTENTIAL: Good  CLINICAL DECISION MAKING: Evolving/moderate complexity  EVALUATION COMPLEXITY: Moderate  PLAN:  PT FREQUENCY: 2x/week  PT DURATION: 12 weeks  PLANNED INTERVENTIONS: [id number]- Physical Performance Testing, 97110-Therapeutic exercises, 97530- Therapeutic activity, [id number]- Neuromuscular re-education, 97535- Self Care, [id number]- Manual therapy, [phone]- Gait training, Balance training, Stair training, Joint mobilization, Cryotherapy, and Moist heat  PLAN FOR NEXT SESSION:   Introduce more ankle exercises. Continues with balance exercises Monitor symptoms with more strenuous exercises as this seems to be the reasoning behind his symptoms   Note: Portions of this document were prepared using Dragon voice recognition software and although reviewed may contain unintentional dictation errors in syntax, grammar, or spelling.  Lonni KATHEE Gainer PT ,DPT Physical Therapist- [facility]   10/16/23, 3:42 PM

## 2023-10-18 ENCOUNTER — Ambulatory Visit: Admitting: Dermatology

## 2023-10-18 ENCOUNTER — Encounter: Payer: Self-pay | Admitting: Dermatology

## 2023-10-18 ENCOUNTER — Ambulatory Visit

## 2023-10-18 DIAGNOSIS — M6281 Muscle weakness (generalized): Secondary | ICD-10-CM | POA: Diagnosis not present

## 2023-10-18 DIAGNOSIS — R262 Difficulty in walking, not elsewhere classified: Secondary | ICD-10-CM

## 2023-10-18 DIAGNOSIS — R269 Unspecified abnormalities of gait and mobility: Secondary | ICD-10-CM

## 2023-10-18 DIAGNOSIS — R2689 Other abnormalities of gait and mobility: Secondary | ICD-10-CM

## 2023-10-18 DIAGNOSIS — L570 Actinic keratosis: Secondary | ICD-10-CM

## 2023-10-18 DIAGNOSIS — D099 Carcinoma in situ, unspecified: Secondary | ICD-10-CM

## 2023-10-18 DIAGNOSIS — D492 Neoplasm of unspecified behavior of bone, soft tissue, and skin: Secondary | ICD-10-CM | POA: Diagnosis not present

## 2023-10-18 DIAGNOSIS — D044 Carcinoma in situ of skin of scalp and neck: Secondary | ICD-10-CM | POA: Diagnosis not present

## 2023-10-18 DIAGNOSIS — R2681 Unsteadiness on feet: Secondary | ICD-10-CM

## 2023-10-18 DIAGNOSIS — L821 Other seborrheic keratosis: Secondary | ICD-10-CM

## 2023-10-18 DIAGNOSIS — C44229 Squamous cell carcinoma of skin of left ear and external auricular canal: Secondary | ICD-10-CM

## 2023-10-18 DIAGNOSIS — I639 Cerebral infarction, unspecified: Secondary | ICD-10-CM

## 2023-10-18 HISTORY — DX: Carcinoma in situ, unspecified: D09.9

## 2023-10-18 NOTE — Therapy (Signed)
 OUTPATIENT PHYSICAL THERAPY NEURO TREATMENT   Patient Name: Vernon Payne. MRN: [id number] DOB:27-Oct-1950, 73 y.o., male 8 Date: 10/18/2023   PCP: Cleotilde Oneil FALCON, MD  REFERRING PROVIDER: Maree Jannett POUR, MD  END OF SESSION:  PT End of Session - 10/18/23 1135     Visit Number 8    Number of Visits 24    Date for PT Re-Evaluation 12/05/23    Progress Note Due on Visit 10    PT Start Time 1136    PT Stop Time 1215    PT Time Calculation (min) 39 min    Equipment Utilized During Treatment Gait belt    Activity Tolerance Patient tolerated treatment well    Behavior During Therapy WFL for tasks assessed/performed          Past Medical History:  Diagnosis Date   Actinic keratosis    Arthritis    Diabetes mellitus without complication (HCC)    GERD (gastroesophageal reflux disease)    Hard of hearing    left hearing aid   HLD (hyperlipidemia)    HTN (hypertension)    Hypotension, postural    IBS (irritable bowel syndrome)    Primary osteoarthritis of right knee    Sleep apnea    Squamous cell carcinoma of skin 01/20/2010   Right chest. Well differentiated. Excised: 01/27/2010   Squamous cell carcinoma of skin 07/15/2019   R preauricular ant to lobe   Stroke [facility])    May 31/ 2023   Wears dentures    partial upper, implants on bottom   Past Surgical History:  Procedure Laterality Date   APPENDECTOMY     BACK SURGERY     lumbar   CATARACT EXTRACTION W/PHACO Right 08/29/2017   Procedure: CATARACT EXTRACTION PHACO AND INTRAOCULAR LENS PLACEMENT (IOC) RIGHT DIABETIC;  Surgeon: Mittie Gaskin, MD;  Location: [facility];  Service: Ophthalmology;  Laterality: Right;  ISTENT INJECT   CATARACT EXTRACTION W/PHACO Left 11/21/2017   Procedure: CATARACT EXTRACTION PHACO AND INTRAOCULAR LENS PLACEMENT (IOC);  Surgeon: Mittie Gaskin, MD;  Location: [facility];  Service: Ophthalmology;  Laterality: Left;   COLONOSCOPY WITH PROPOFOL  N/A 01/26/2023    Procedure: COLONOSCOPY WITH PROPOFOL ;  Surgeon: Onita Elspeth Sharper, DO;  Location: [facility] END[facility]COPY;  Service: Gastroenterology;  Laterality: N/A;   EYE SURGERY Left 11/21/2017   cataract excision   INSERTION OF ANTERIOR SEGMENT AQUEOUS DRAINAGE DEVICE (ISTENT) Right 08/29/2017   Procedure: (ISTENT);  Surgeon: Mittie Gaskin, MD;  Location: [facility];  Service: Ophthalmology;  Laterality: Right;  Diabetic - oral meds   INSERTION OF ANTERIOR SEGMENT AQUEOUS DRAINAGE DEVICE (ISTENT) Left 11/21/2017   Procedure: INSERTION OF ANTERIOR SEGMENT AQUEOUS DRAINAGE DEVICE (ISTENT)  INJECT DIABETES ISTENT INJECT;  Surgeon: Mittie Gaskin, MD;  Location: [facility];  Service: Ophthalmology;  Laterality: Left;  Diabetic - oral meds   JOINT REPLACEMENT     left knee replacement   KNEE ARTHROPLASTY Right 04/30/2023   Procedure: ARTHROPLASTY, KNEE, TOTAL, USING IMAGELESS COMPUTER-ASSISTED NAVIGATION;  Surgeon: Mardee Lynwood SQUIBB, MD;  Location: [facility] ORS;  Service: Orthopedics;  Laterality: Right;   KYPHOPLASTY N/A 03/28/2019   Procedure: L1 KYPHOPLASTY;  Surgeon: Kathlynn Sharper, MD;  Location: [facility];  Service: Orthopedics;  Laterality: N/A;   LASIK     POLYPECTOMY  01/26/2023   Procedure: POLYPECTOMY;  Surgeon: Onita Elspeth Sharper, DO;  Location: [facility] END[facility]COPY;  Service: Gastroenterology;;   MARCELINE CUFF REPAIR     TONSILLECTOMY     Patient  Active Problem List   Diagnosis Date Noted   SAH (subarachnoid hemorrhage) (HCC) 09/14/2023   SDH (subdural hematoma) (HCC) 09/14/2023   Fall 09/14/2023   Type 2 diabetes mellitus with peripheral neuropathy (HCC) 09/14/2023   Depression 09/14/2023   Focal seizure (HCC) 09/14/2023   History of total knee arthroplasty, right 04/30/2023   Acute CVA (cerebrovascular accident) (HCC)    Right hemiparesis (HCC) 07/20/2021   Closed compression fracture of body of L1 vertebra (HCC) 04/28/2019   Family history of colon cancer 03/26/2018    Primary osteoarthritis of right knee 09/04/2017   Primary osteoarthritis of left knee 09/04/2017   Status post left partial knee replacement 09/04/2017   OSA on CPAP 01/24/2017   Yellow jacket sting 10/31/2016   HTN (hypertension) 10/31/2016   Type 2 diabetes mellitus with hyperlipidemia (HCC) 10/31/2016   HLD (hyperlipidemia) 10/31/2016   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   Benign essential HTN 10/15/2013    ONSET DATE: 04/30/23  REFERRING DIAG: R26.89 (ICD-10-CM) - Imbalance   THERAPY DIAG:  Muscle weakness (generalized)  Difficulty in walking, not elsewhere classified  Abnormality of gait and mobility  Other abnormalities of gait and mobility  Unsteadiness on feet  Acute CVA (cerebrovascular accident) (HCC)  Rationale for Evaluation and Treatment: Rehabilitation  SUBJECTIVE:                                                                                                                                                                                             SUBJECTIVE STATEMENT:  Pt reports that his equilibrium is off lately.  Pt notes that he hasn't fallen per se, but has had more stumbles when standing from seated position.  From Eval: Pt had knee replacement in the R side on April 30, 2023. Pt had first fall on 06/2023. Pt had surgery with Dr. Hooten. Pt reports he still has clicking in the right knee and has pain in the right knee that he reports as being worse since before the surgery. Pt reports he is going to see the doctor in a few months again regarding the right knee. Pt reports not walking as well as he used to and notices he moves to the left when he is ambulating. Pt also notes recent unintentional weight loss, has discussed it with MD per his report.   Pt accompanied by: significant other  PERTINENT HISTORY: Pt familiar to clinic previously for post stroke rehab in later half of 2023. Pt has hx of  OA, Diabetes mellitus type 2, controlled, with  complications, GERD, Glaucoma   Hemorrhoids   Hyperlipidemia,Hypertension, OSA on CPAP   PAIN:  Are you having pain? Yes: NPRS scale: 7 Pain location: R knee Pain description: constant Aggravating factors: kneeling Relieving factors: n/a  PRECAUTIONS: Fall  RED FLAGS: None   WEIGHT BEARING RESTRICTIONS: No  FALLS: Has patient fallen in last 6 months? Yes. Number of falls 6-7  LIVING ENVIRONMENT: Lives with: lives with their family Lives in: House/apartment Stairs: Yes: External: 7 steps; none Has following equipment at home: Single point cane and Walker - 2 wheeled  PLOF: Independent with basic ADLs and has lessened his activity and uses his wife to assist with things  PATIENT GOALS: Improve balance, gait and mobility   OBJECTIVE:  Note: Objective measures were completed at Evaluation unless otherwise noted.  DIAGNOSTIC FINDINGS: n/a  COGNITION: Overall cognitive status: Within functional limits for tasks assessed   SENSATION: Not tested  LOWER EXTREMITY ROM:     Active  Right Eval Left Eval  Hip flexion    Hip extension    Hip abduction    Hip adduction    Hip internal rotation    Hip external rotation    Knee flexion    Knee extension 15 deg from full ext seated AROM    Ankle dorsiflexion    Ankle plantarflexion    Ankle inversion    Ankle eversion     (Blank rows = not tested)  LOWER EXTREMITY MMT:    MMT Right Eval Left Eval  Hip flexion 3+ 4  Hip extension    Hip abduction 4 4+  Hip adduction 4+ 4+  Hip internal rotation    Hip external rotation    Knee flexion 4 4+  Knee extension 4 4+  Ankle dorsiflexion 4+ 4+  Ankle plantarflexion    Ankle inversion    Ankle eversion    (Blank rows = not tested)  BED MOBILITY:  No reported issues with this  TRANSFERS: Sit to stand: Modified independence and CGA  Assistive device utilized: hands needed to complete     Stand to sit: Modified independence  Assistive device utilized: constols  descent with hands        STAIRS: Pt reports using step to pattern with at least one fall on steps in past 6 months.  GAIT: Findings: Gait Characteristics: R knee does get to full extension, decreased step length- Right, decreased step length- Left, decreased stride length, decreased hip/knee flexion- Right, and decreased hip/knee flexion- Left, Distance walked: 30 ft, Level of assistance: Modified independence, and Comments: with turning around pt shows visible instability and mutiple small steps to keep balance, reaches for objects when near them in counter/furniture surfing style walking   FUNCTIONAL TESTS:   Orthostatic assessment: seated 142/76 HR 79 standing 136/73 HR 83  5 times sit to stand: 35.22 sec with heavyUE A Timed up and go (TUG): 19.88 sec  6 minute walk test: : test visit 2  10 meter walk test: .81 m/s BERG test visit 2   PATIENT SURVEYS:  ABC scale: The Activities-Specific Balance Confidence (ABC) Scale 0% 10 20 30  40 50 60 70 80 90 100% No confidence<->completely confident  "How confident are you that you will not lose your balance or become unsteady when you . . .   Date tested 09/12/23  Walk around the house 70%  2. Walk up or down stairs 60%  3. Bend over and pick up a slipper from in front of a closet floor 30%  4. Reach for a small can off a shelf at eye level 100%  5.  Stand on tip toes and reach for something above your head 80%  6. Stand on a chair and reach for something 0%  7. Sweep the floor 40%  8. Walk outside the house to a car parked in the driveway 80%  9. Get into or out of a car 90%  10. Walk across a parking lot to the mall 80%  11. Walk up or down a ramp 80%  12. Walk in a crowded mall where people rapidly walk past you 70%  13. Are bumped into by people as you walk through the mall 70%  14. Step onto or off of an escalator while you are holding onto the railing 60%  15. Step onto or off an escalator while holding onto parcels such that  you cannot hold onto the railing 60%  16. Walk outside on icy sidewalks 10%  Total: #/16 61.9%                                                                                                                                 TREATMENT DATE: 10/18/23   TA- To improve functional movements patterns for everyday tasks   Nustep BLE only  reciprocal movement training x6.5 min level 1-3  STS 2x10 reps no UE assist, min A at times for initial propulsion but improved with reps   Standing heel/toe raises with UE support 2x15  Following Exercises: BP: 143/98 mmHg Pulse: 94   TE- To improve strength, endurance, mobility, and function of specific targeted muscle groups or improve joint range of motion or improve muscle flexibility  Seated HS curls with cues for full available knee extension on return motion x10 ea with BTB  Standing hip abduction with BTB, 2x10 each LE  Standing hip extension with BTB, 2x10 each Le  Seated marches with BTB around knees, 2x15 each LE   NMR: To facilitate reeducation of movement, balance, posture, coordination, and/or proprioception/kinesthetic sense.  1 LE on 6 step with other LE on the floor, 60 sec x3 each LE leading to challenge balance, pt with significant swaying during the attempts    PATIENT EDUCATION: Education details: Pt educated throughout session about proper posture and technique with exercises. Improved exercise technique, movement at target joints, use of target muscles after min to mod verbal, visual, tactile cues Person educated: Patient Education method: Explanation Education comprehension: verbalized understanding   HOME EXERCISE PROGRAM: Access Code: [email or web address] URL: [email or web address] Date: 09/25/2023 Prepared by: Lonni Gainer  Exercises - Marching Near Counter  - 1 x daily - 7 x weekly - 2 sets - 10 reps - Backward Walking with Counter Support  - 1 x daily - 7 x weekly - 2 sets   GOALS: Goals reviewed  with patient? Yes  SHORT TERM GOALS: Target date: 10/10/2023  Patient will be independent in home exercise program to improve strength/mobility for better functional independence with ADLs. Baseline: No HEP currently  Goal status: INITIAL  LONG TERM GOALS: Target date: 12/05/2023  1.  Patient will complete five times sit to stand test in < 15 seconds indicating an increased LE strength and improved balance. Baseline: 35.22 sec  with heavy UE assist Goal status: INITIAL  2.  Patient will improve ABC score to 71.5%   to demonstrate statistically significant improvement in mobility and quality of life as it relates to their balance and mobility.  Baseline: 61.9% Goal status: INITIAL   3.  Patient will increase Berg Balance score by > 6 points to demonstrate decreased fall risk during functional activities. Baseline: test visit 2  Goal status: INITIAL   4.  Patient will reduce timed up and go to <11 seconds to reduce fall risk and demonstrate improved transfer/gait ability. Baseline: 19.88 sec Goal status: INITIAL  5.  Patient will increase 10 meter walk test to >1.33m/s as to improve gait speed for better community ambulation and to reduce fall risk. Baseline: .27m/s Goal status: INITIAL  6.  Patient will increase six minute walk test distance by greater than 150 ft for progression to community ambulator and improve gait ability Baseline: test visit 2  Goal status: INITIAL    ASSESSMENT:  CLINICAL IMPRESSION:  Pt found to be much more wobbly during the session.  Pt much worse than the last visit and was unsteady with walking at times, and reaching for the wall at times.  Pt needed to have the external stimulus to remain upright and feel steady.  Pt also with reduced sensation and proprioception during the attempt to place one foot on the step and keep balance.  Pt encouraged to continue to mobilize at home in order to improve tolerance to exercises and improve overall mobility.   Pt will continue to benefit from skilled therapy to address remaining deficits in order to improve overall QoL and return to PLOF.         OBJECTIVE IMPAIRMENTS: Abnormal gait, decreased activity tolerance, decreased balance, decreased endurance, decreased mobility, difficulty walking, decreased strength, and pain.   ACTIVITY LIMITATIONS: standing, squatting, stairs, transfers, and locomotion level  PARTICIPATION LIMITATIONS: shopping, community activity, and yard work  PERSONAL FACTORS: Age, Behavior pattern, Time since onset of injury/illness/exacerbation, and 3+ comorbidities: DM, HTN, OA, HLD, OSA, R knee pain are also affecting patient's functional outcome.   REHAB POTENTIAL: Good  CLINICAL DECISION MAKING: Evolving/moderate complexity  EVALUATION COMPLEXITY: Moderate  PLAN:  PT FREQUENCY: 2x/week  PT DURATION: 12 weeks  PLANNED INTERVENTIONS: 97750- Physical Performance Testing, 97110-Therapeutic exercises, 97530- Therapeutic activity, [id number]- Neuromuscular re-education, 97535- Self Care, [id number]- Manual therapy, [phone]- Gait training, Balance training, Stair training, Joint mobilization, Cryotherapy, and Moist heat  PLAN FOR NEXT SESSION:   Introduce more ankle exercises. Continues with balance exercises Monitor symptoms with more strenuous exercises as this seems to be the reasoning behind his symptoms    Fonda LELON Simpers PT ,DPT Physical Therapist- [facility]  10/18/23, 5:20 PM

## 2023-10-18 NOTE — Patient Instructions (Addendum)

## 2023-10-18 NOTE — Progress Notes (Signed)
   Follow-Up Visit   Subjective  Vernon Payne. is a 73 y.o. male who presents for the following: check spot L post ear, Dr. Cleotilde txted with LN2 09/24/2023, has gotten larger and painful, check spots R neck, and chest  Patient accompanied by wife.  The following portions of the chart were reviewed this encounter and updated as appropriate: medications, allergies, medical history  Review of Systems:  No other skin or systemic complaints except as noted in HPI or Assessment and Plan.  Objective  Well appearing patient in no apparent distress; mood and affect are within normal limits.   A focused examination was performed of the following areas: Left ear  Relevant exam findings are noted in the Assessment and Plan.  L post helix 1.3cm keratotic plaque  R neck 1.5cm from R lobe 8.68mm pink scaly pap   Assessment & Plan   NEOPLASM OF SKIN (2) L post helix Skin / nail biopsy Type of biopsy: tangential   Informed consent: discussed and consent obtained   Timeout: patient name, date of birth, surgical site, and procedure verified   Procedure prep:  Patient was prepped and draped in usual sterile fashion Prep type:  Isopropyl alcohol Anesthesia: the lesion was anesthetized in a standard fashion   Anesthetic:  1% lidocaine  w/ epinephrine  1-100,000 buffered w/ 8.4% NaHCO3 Instrument used: DermaBlade   Hemostasis achieved with: pressure and aluminum chloride   Outcome: patient tolerated procedure well   Post-procedure details: sterile dressing applied and wound care instructions given   Dressing type: bandage (mupirocin)    Specimen 1 - Surgical pathology Differential Diagnosis: R/O SCC  Check Margins: No 1.3cm keratotic plaque 2 pieces superficial and deeper sample R neck 1.5cm from R lobe Skin / nail biopsy Type of biopsy: tangential   Informed consent: discussed and consent obtained   Timeout: patient name, date of birth, surgical site, and procedure verified    Procedure prep:  Patient was prepped and draped in usual sterile fashion Prep type:  Isopropyl alcohol Anesthesia: the lesion was anesthetized in a standard fashion   Anesthetic:  1% lidocaine  w/ epinephrine  1-100,000 buffered w/ 8.4% NaHCO3 Instrument used: DermaBlade   Hemostasis achieved with: pressure and aluminum chloride   Outcome: patient tolerated procedure well   Post-procedure details: sterile dressing applied and wound care instructions given   Dressing type: bandage (mupirocin)    Specimen 2 - Surgical pathology Differential Diagnosis: R/O SCC  Check Margins: No 8.43mm pink scaly pap 2 pieces superficial and deeper sample Discussed if L post helix is SCC will recommend mohs with Dr. Corey SEBORRHEIC KERATOSES    SEBORRHEIC KERATOSIS chest - Stuck-on, waxy, tan-brown papules and/or plaques  - Benign-appearing - Discussed benign etiology and prognosis. - Observe - Call for any changes  Return for as scheduled for TBSE, Hx of SCC, Hx of AKs.  I, Grayce Saunas, RMA, am acting as scribe for Boneta Sharps, MD .   Documentation: I have reviewed the above documentation for accuracy and completeness, and I agree with the above.  Boneta Sharps, MD

## 2023-10-23 ENCOUNTER — Ambulatory Visit

## 2023-10-23 LAB — SURGICAL PATHOLOGY

## 2023-10-24 ENCOUNTER — Ambulatory Visit: Payer: Self-pay | Admitting: Dermatology

## 2023-10-24 DIAGNOSIS — D099 Carcinoma in situ, unspecified: Secondary | ICD-10-CM

## 2023-10-24 NOTE — Telephone Encounter (Signed)
-----   Message from [facility] sent at 10/24/2023  2:23 PM EDT ----- Diagnosis 1. Skin, L post helix :       SQUAMOUS CELL CARCINOMA, KERATOACANTHOMA TYPE        2. 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  L post helix Biopsy shows an invasive squamous cell skin cancer. Recommend Mohs  2. R neck Biopsy shows a squamous cell skin cancer limited to the top layer of skin.   Treatment option 1: a cream (fluorouracil  and calcipotriene) that helps your immune system clear the skin cancer. It will cause redness and irritation. Wait two weeks after the biopsy to start  applying the cream. Apply the cream twice per day until the redness and irritation develop (usually occurs by day 7), then stop and allow it to heal. We will recheck the area in 2 months to ensure  the cancer is gone. The cream is $45 plus shipping and will be mailed to you from a low cost compounding pharmacy.  Treatment option 2: you return for a brief appointment where I perform electrodesiccation and curettage [facility]). This involves three rounds of scraping and burning to destroy the skin cancer. It has  about an 85% cure rate and leaves a round wound slightly larger than the skin cancer and leaves a round white scar. No additional pathology is done. If the skin cancer comes back, we would need to do  a surgery to remove it.  Treatment option 3: Mohs surgery, which involves cutting out right around the skin cancer and then checking under the microscope on the same day to ensure the whole skin cancer is out. If there is  more cancer remaining, the surgeon will repeat the process until it is fully cleared. The cure rate is about 98-99%. It is done at another office outside of [place] ([place], [place], or  [place]). Once the Mohs surgeon confirms the skin cancer is out, they  will discuss the options to repair or heal the area. You must take it easy for about two weeks after surgery (no lifting over  10-15 lbs, avoid activity to get your heart rate and blood pressure up). ----- Message ----- From: Interface, Lab In [facility] Sent: 10/23/2023   2:09 PM EDT To: Boneta Sharps, MD

## 2023-10-24 NOTE — Telephone Encounter (Signed)
 Discussed pathology results and treatment plan/options with patient's wife. Mohs referral sent to Dr. Paci for SCC/KA at L helix. Mrs. Howatt will discuss treatment options regarding SCCis on R neck with patient and call us  back tomorrow with his decision.

## 2023-10-25 ENCOUNTER — Ambulatory Visit: Attending: Neurology

## 2023-10-25 DIAGNOSIS — R262 Difficulty in walking, not elsewhere classified: Secondary | ICD-10-CM | POA: Insufficient documentation

## 2023-10-25 DIAGNOSIS — R2689 Other abnormalities of gait and mobility: Secondary | ICD-10-CM | POA: Diagnosis present

## 2023-10-25 DIAGNOSIS — M6281 Muscle weakness (generalized): Secondary | ICD-10-CM | POA: Insufficient documentation

## 2023-10-25 DIAGNOSIS — I639 Cerebral infarction, unspecified: Secondary | ICD-10-CM | POA: Insufficient documentation

## 2023-10-25 DIAGNOSIS — R2681 Unsteadiness on feet: Secondary | ICD-10-CM | POA: Insufficient documentation

## 2023-10-25 DIAGNOSIS — R41841 Cognitive communication deficit: Secondary | ICD-10-CM | POA: Insufficient documentation

## 2023-10-25 DIAGNOSIS — G3184 Mild cognitive impairment, so stated: Secondary | ICD-10-CM | POA: Diagnosis present

## 2023-10-25 DIAGNOSIS — R269 Unspecified abnormalities of gait and mobility: Secondary | ICD-10-CM | POA: Diagnosis present

## 2023-10-25 NOTE — Therapy (Signed)
 OUTPATIENT PHYSICAL THERAPY NEURO TREATMENT   Patient Name: Vernon Payne. MRN: [id number] DOB:[birth year], 73 y.o., male Today's Date: 10/25/2023   PCP: Cleotilde Oneil FALCON, MD  REFERRING PROVIDER: Maree Jannett POUR, MD  END OF SESSION:  PT End of Session - 10/25/23 1149     Visit Number 9    Number of Visits 24    Date for PT Re-Evaluation 12/05/23    Progress Note Due on Visit 10    PT Start Time 1148    PT Stop Time 1230    PT Time Calculation (min) 42 min    Equipment Utilized During Treatment Gait belt    Activity Tolerance Patient tolerated treatment well    Behavior During Therapy WFL for tasks assessed/performed          Past Medical History:  Diagnosis Date   Actinic keratosis    Arthritis    Diabetes mellitus without complication (HCC)    GERD (gastroesophageal reflux disease)    Hard of hearing    left hearing aid   HLD (hyperlipidemia)    HTN (hypertension)    Hypotension, postural    IBS (irritable bowel syndrome)    Primary osteoarthritis of right knee    Sleep apnea    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 Tx pending   Squamous cell carcinoma of skin 01/20/2010   Right chest. Well differentiated. Excised: 01/27/2010   Squamous cell carcinoma of skin 07/15/2019   R preauricular ant to lobe   Squamous cell carcinoma of skin 10/18/2023   Left posterior helix. KA type. Mohs pending.   Stroke [facility])    May 31/ 2023   Wears dentures    partial upper, implants on bottom   Past Surgical History:  Procedure Laterality Date   APPENDECTOMY     BACK SURGERY     lumbar   CATARACT EXTRACTION W/PHACO Right 08/29/2017   Procedure: CATARACT EXTRACTION PHACO AND INTRAOCULAR LENS PLACEMENT (IOC) RIGHT DIABETIC;  Surgeon: Mittie Gaskin, MD;  Location: [facility];  Service: Ophthalmology;  Laterality: Right;  ISTENT INJECT   CATARACT EXTRACTION W/PHACO Left 11/21/2017   Procedure: CATARACT EXTRACTION PHACO AND  INTRAOCULAR LENS PLACEMENT (IOC);  Surgeon: Mittie Gaskin, MD;  Location: [facility];  Service: Ophthalmology;  Laterality: Left;   COLONOSCOPY WITH PROPOFOL  N/A 01/26/2023   Procedure: COLONOSCOPY WITH PROPOFOL ;  Surgeon: Onita Elspeth Sharper, DO;  Location: [facility];  Service: Gastroenterology;  Laterality: N/A;   EYE SURGERY Left 11/21/2017   cataract excision   INSERTION OF ANTERIOR SEGMENT AQUEOUS DRAINAGE DEVICE (ISTENT) Right 08/29/2017   Procedure: (ISTENT);  Surgeon: Mittie Gaskin, MD;  Location: [facility];  Service: Ophthalmology;  Laterality: Right;  Diabetic - oral meds   INSERTION OF ANTERIOR SEGMENT AQUEOUS DRAINAGE DEVICE (ISTENT) Left 11/21/2017   Procedure: INSERTION OF ANTERIOR SEGMENT AQUEOUS DRAINAGE DEVICE (ISTENT)  INJECT DIABETES ISTENT INJECT;  Surgeon: Mittie Gaskin, MD;  Location: [facility];  Service: Ophthalmology;  Laterality: Left;  Diabetic - oral meds   JOINT REPLACEMENT     left knee replacement   KNEE ARTHROPLASTY Right 04/30/2023   Procedure: ARTHROPLASTY, KNEE, TOTAL, USING IMAGELESS COMPUTER-ASSISTED NAVIGATION;  Surgeon: Mardee Lynwood SQUIBB, MD;  Location: [facility];  Service: Orthopedics;  Laterality: Right;   KYPHOPLASTY N/A 03/28/2019   Procedure: L1 KYPHOPLASTY;  Surgeon: Kathlynn Sharper, MD;  Location: [facility];  Service: Orthopedics;  Laterality: N/A;   LASIK  POLYPECTOMY  01/26/2023   Procedure: POLYPECTOMY;  Surgeon: Onita Elspeth Sharper, DO;  Location: [facility] [place] [facility];  Service: Gastroenterology;;   ROTATOR CUFF REPAIR     TONSILLECTOMY     Patient Active Problem List   Diagnosis Date Noted   SAH (subarachnoid hemorrhage) (HCC) 09/14/2023   SDH (subdural hematoma) (HCC) 09/14/2023   Fall 09/14/2023   Type 2 diabetes mellitus with peripheral neuropathy (HCC) 09/14/2023   Depression 09/14/2023   Focal seizure (HCC) 09/14/2023   History of total knee arthroplasty, right 04/30/2023   Acute CVA  (cerebrovascular accident) (HCC)    Right hemiparesis (HCC) 07/20/2021   Closed compression fracture of body of L1 vertebra (HCC) 04/28/2019   Family history of colon cancer 03/26/2018   Primary osteoarthritis of right knee 09/04/2017   Primary osteoarthritis of left knee 09/04/2017   Status post left partial knee replacement 09/04/2017   OSA on CPAP 01/24/2017   Yellow jacket sting 10/31/2016   HTN (hypertension) 10/31/2016   Type 2 diabetes mellitus with hyperlipidemia (HCC) 10/31/2016   HLD (hyperlipidemia) 10/31/2016   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   Benign essential HTN 10/15/2013    ONSET DATE: 04/30/23  REFERRING DIAG: R26.89 (ICD-10-CM) - Imbalance   THERAPY DIAG:  Muscle weakness (generalized)  Difficulty in walking, not elsewhere classified  Abnormality of gait and mobility  Other abnormalities of gait and mobility  Unsteadiness on feet  Acute CVA (cerebrovascular accident) (HCC)  Cognitive communication deficit  Mild cognitive impairment  Rationale for Evaluation and Treatment: Rehabilitation  SUBJECTIVE:                                                                                                                                                                                             SUBJECTIVE STATEMENT:  Pt reports he felt like someone was drilling a hole in his R knee yesterday, but it is much better today.  Pt denies any other complications.    From Eval: Pt had knee replacement in the R side on April 30, 2023. Pt had first fall on 06/2023. Pt had surgery with Dr. Hooten. Pt reports he still has clicking in the right knee and has pain in the right knee that he reports as being worse since before the surgery. Pt reports he is going to see the doctor in a few months again regarding the right knee. Pt reports not walking as well as he used to and notices he moves to the left when he is ambulating. Pt also notes recent unintentional weight  loss, has discussed it with MD per his report.   Pt accompanied by: significant  other  PERTINENT HISTORY: Pt familiar to clinic previously for post stroke rehab in later half of 2023. Pt has hx of  OA, Diabetes mellitus type 2, controlled, with complications, GERD, Glaucoma   Hemorrhoids   Hyperlipidemia,Hypertension, OSA on CPAP   PAIN:  Are you having pain? Yes: NPRS scale: 7 Pain location: R knee Pain description: constant Aggravating factors: kneeling Relieving factors: n/a  PRECAUTIONS: Fall  RED FLAGS: None   WEIGHT BEARING RESTRICTIONS: No  FALLS: Has patient fallen in last 6 months? Yes. Number of falls 6-7  LIVING ENVIRONMENT: Lives with: lives with their family Lives in: House/apartment Stairs: Yes: External: 7 steps; none Has following equipment at home: Single point cane and Walker - 2 wheeled  PLOF: Independent with basic ADLs and has lessened his activity and uses his wife to assist with things  PATIENT GOALS: Improve balance, gait and mobility   OBJECTIVE:  Note: Objective measures were completed at Evaluation unless otherwise noted.  DIAGNOSTIC FINDINGS: n/a  COGNITION: Overall cognitive status: Within functional limits for tasks assessed   SENSATION: Not tested  LOWER EXTREMITY ROM:     Active  Right Eval Left Eval  Hip flexion    Hip extension    Hip abduction    Hip adduction    Hip internal rotation    Hip external rotation    Knee flexion    Knee extension 15 deg from full ext seated AROM    Ankle dorsiflexion    Ankle plantarflexion    Ankle inversion    Ankle eversion     (Blank rows = not tested)  LOWER EXTREMITY MMT:    MMT Right Eval Left Eval  Hip flexion 3+ 4  Hip extension    Hip abduction 4 4+  Hip adduction 4+ 4+  Hip internal rotation    Hip external rotation    Knee flexion 4 4+  Knee extension 4 4+  Ankle dorsiflexion 4+ 4+  Ankle plantarflexion    Ankle inversion    Ankle eversion    (Blank rows = not  tested)  BED MOBILITY:  No reported issues with this  TRANSFERS: Sit to stand: Modified independence and CGA  Assistive device utilized: hands needed to complete     Stand to sit: Modified independence  Assistive device utilized: constols descent with hands        STAIRS: Pt reports using step to pattern with at least one fall on steps in past 6 months.  GAIT: Findings: Gait Characteristics: R knee does get to full extension, decreased step length- Right, decreased step length- Left, decreased stride length, decreased hip/knee flexion- Right, and decreased hip/knee flexion- Left, Distance walked: 30 ft, Level of assistance: Modified independence, and Comments: with turning around pt shows visible instability and mutiple small steps to keep balance, reaches for objects when near them in counter/furniture surfing style walking   FUNCTIONAL TESTS:   Orthostatic assessment: seated 142/76 HR 79 standing 136/73 HR 83  5 times sit to stand: 35.22 sec with heavyUE A Timed up and go (TUG): 19.88 sec  6 minute walk test: : test visit 2  10 meter walk test: .81 m/s BERG test visit 2   PATIENT SURVEYS:  ABC scale: The Activities-Specific Balance Confidence (ABC) Scale 0% 10 20 30  40 50 60 70 80 90 100% No confidence<->completely confident  "How confident are you that you will not lose your balance or become unsteady when you . . .   Date tested 09/12/23  Walk around the house 70%  2. Walk up or down stairs 60%  3. Bend over and pick up a slipper from in front of a closet floor 30%  4. Reach for a small can off a shelf at eye level 100%  5. Stand on tip toes and reach for something above your head 80%  6. Stand on a chair and reach for something 0%  7. Sweep the floor 40%  8. Walk outside the house to a car parked in the driveway 80%  9. Get into or out of a car 90%  10. Walk across a parking lot to the mall 80%  11. Walk up or down a ramp 80%  12. Walk in a crowded mall where people  rapidly walk past you 70%  13. Are bumped into by people as you walk through the mall 70%  14. Step onto or off of an escalator while you are holding onto the railing 60%  15. Step onto or off an escalator while holding onto parcels such that you cannot hold onto the railing 60%  16. Walk outside on icy sidewalks 10%  Total: #/16 61.9%                                                                                                                                 TREATMENT DATE: 10/25/23   BP: 159/83 Pulse: 92  TherAct- To improve functional movements patterns for everyday tasks   The patient completed 6 minutes at level(s) 1-5 on the NuStep using both B UE/B LE and at times just B LE's reciprocal movements to promote strength, endurance, and cardiorespiratory fitness. PT increased the resistance level and monitored the patient's response to the intervention throughout. The patient required min cueing for technique and supervision level of assistance.   STS 2x10 reps no UE assist, min A at times for initial propulsion but improved with reps   Standing heel/toe raises with UE support 2x15    Gait Training:  Patient ambulated 23 min indoor/outdoor today- including tile, concrete, carpet, horizontal head turns, on varied surfaces: concrete, brick, asphalt, mulch, grass, up ramp, inclines, declines- with 0 seated rest break(s) - CGA for all indoor/outdoor ambulation - 0 instances of instability when ambulating  - Focusing on overall endurance and to improve ambulation across uneven terrain that pt would come in contact with in the community and during cruise.     PATIENT EDUCATION: Education details: Pt educated throughout session about proper posture and technique with exercises. Improved exercise technique, movement at target joints, use of target muscles after min to mod verbal, visual, tactile cues Person educated: Patient Education method: Explanation Education comprehension:  verbalized understanding   HOME EXERCISE PROGRAM: Access Code: [email or web address] URL: [email or web address] Date: 09/25/2023 Prepared by: Lonni Gainer  Exercises - Marching Near Counter  - 1 x daily - 7 x weekly - 2 sets - 10 reps - Backward Walking with [employer] Support  -  1 x daily - 7 x weekly - 2 sets   GOALS: Goals reviewed with patient? Yes  SHORT TERM GOALS: Target date: 10/10/2023  Patient will be independent in home exercise program to improve strength/mobility for better functional independence with ADLs. Baseline: No HEP currently  Goal status: INITIAL   LONG TERM GOALS: Target date: 12/05/2023  1.  Patient will complete five times sit to stand test in < 15 seconds indicating an increased LE strength and improved balance. Baseline: 35.22 sec  with heavy UE assist Goal status: INITIAL  2.  Patient will improve ABC score to 71.5%   to demonstrate statistically significant improvement in mobility and quality of life as it relates to their balance and mobility.  Baseline: 61.9% Goal status: INITIAL   3.  Patient will increase Berg Balance score by > 6 points to demonstrate decreased fall risk during functional activities. Baseline: test visit 2  Goal status: INITIAL   4.  Patient will reduce timed up and go to <11 seconds to reduce fall risk and demonstrate improved transfer/gait ability. Baseline: 19.88 sec Goal status: INITIAL  5.  Patient will increase 10 meter walk test to >1.65m/s as to improve gait speed for better community ambulation and to reduce fall risk. Baseline: .46m/s Goal status: INITIAL  6.  Patient will increase six minute walk test distance by greater than 150 ft for progression to community ambulator and improve gait ability Baseline: test visit 2  Goal status: INITIAL    ASSESSMENT:  CLINICAL IMPRESSION:  Pt responded well to the exercises and the ability to ambulate outside to simulate uneven surfaces that the pt may come in  contact on the cruise ship.  Pt is ultimately making significant improvements in his ability to ambulate and only has unsteadiness when becoming fatigued.  Pt is able to self-correct when becoming slightly unsteady.  Pt still has wobbly gait at times, however remains upright.  Pt will continue to benefit from continued endurance challenge and to improve steadiness on uneven surfaces.   Pt will continue to benefit from skilled therapy to address remaining deficits in order to improve overall QoL and return to PLOF.     OBJECTIVE IMPAIRMENTS: Abnormal gait, decreased activity tolerance, decreased balance, decreased endurance, decreased mobility, difficulty walking, decreased strength, and pain.   ACTIVITY LIMITATIONS: standing, squatting, stairs, transfers, and locomotion level  PARTICIPATION LIMITATIONS: shopping, community activity, and yard work  PERSONAL FACTORS: Age, Behavior pattern, Time since onset of injury/illness/exacerbation, and 3+ comorbidities: DM, HTN, OA, HLD, OSA, R knee pain are also affecting patient's functional outcome.   REHAB POTENTIAL: Good  CLINICAL DECISION MAKING: Evolving/moderate complexity  EVALUATION COMPLEXITY: Moderate  PLAN:  PT FREQUENCY: 2x/week  PT DURATION: 12 weeks  PLANNED INTERVENTIONS: [id number]- Physical Performance Testing, 97110-Therapeutic exercises, 97530- Therapeutic activity, [id number]- Neuromuscular re-education, 97535- Self Care, [id number]- Manual therapy, [phone]- Gait training, Balance training, Stair training, Joint mobilization, Cryotherapy, and Moist heat   PLAN FOR NEXT SESSION:   Introduce more ankle exercises. Continues with balance exercises Monitor symptoms with more strenuous exercises as this seems to be the reasoning behind his symptoms Endurance training and walking across uneven surfaces    Fonda LELON Simpers PT ,DPT Physical Therapist- [facility]  [facility]  10/25/23, 6:40 PM

## 2023-10-31 ENCOUNTER — Ambulatory Visit: Admitting: Physical Therapy

## 2023-10-31 DIAGNOSIS — R2689 Other abnormalities of gait and mobility: Secondary | ICD-10-CM

## 2023-10-31 DIAGNOSIS — R269 Unspecified abnormalities of gait and mobility: Secondary | ICD-10-CM

## 2023-10-31 DIAGNOSIS — M6281 Muscle weakness (generalized): Secondary | ICD-10-CM | POA: Diagnosis not present

## 2023-10-31 DIAGNOSIS — R262 Difficulty in walking, not elsewhere classified: Secondary | ICD-10-CM

## 2023-10-31 DIAGNOSIS — R2681 Unsteadiness on feet: Secondary | ICD-10-CM

## 2023-10-31 NOTE — Therapy (Signed)
 OUTPATIENT PHYSICAL THERAPY NEURO TREATMENT /Physical Therapy Progress Note   Dates of reporting period  09/12/23   to   10/31/23    Patient Name: Vernon Payne. MRN: [id number] DOB:12-18-1950, 73 y.o., male Today's Date: 10/31/2023   PCP: Cleotilde Oneil FALCON, MD  REFERRING PROVIDER: Maree Jannett POUR, MD  END OF SESSION:  PT End of Session - 10/31/23 1658     Visit Number 10    Number of Visits 24    Date for PT Re-Evaluation 12/05/23    Progress Note Due on Visit 10    PT Start Time 1617    PT Stop Time 1657    PT Time Calculation (min) 40 min    Equipment Utilized During Treatment Gait belt    Activity Tolerance Patient tolerated treatment well    Behavior During Therapy WFL for tasks assessed/performed           Past Medical History:  Diagnosis Date   Actinic keratosis    Arthritis    Diabetes mellitus without complication (HCC)    GERD (gastroesophageal reflux disease)    Hard of hearing    left hearing aid   HLD (hyperlipidemia)    HTN (hypertension)    Hypotension, postural    IBS (irritable bowel syndrome)    Primary osteoarthritis of right knee    Sleep apnea    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 Tx pending   Squamous cell carcinoma of skin 01/20/2010   Right chest. Well differentiated. Excised: 01/27/2010   Squamous cell carcinoma of skin 07/15/2019   R preauricular ant to lobe   Squamous cell carcinoma of skin 10/18/2023   Left posterior helix. KA type. Mohs pending.   Stroke [facility])    May 31/ 2023   Wears dentures    partial upper, implants on bottom   Past Surgical History:  Procedure Laterality Date   APPENDECTOMY     BACK SURGERY     lumbar   CATARACT EXTRACTION W/PHACO Right 08/29/2017   Procedure: CATARACT EXTRACTION PHACO AND INTRAOCULAR LENS PLACEMENT (IOC) RIGHT DIABETIC;  Surgeon: Mittie Gaskin, MD;  Location: [facility];  Service: Ophthalmology;  Laterality: Right;  ISTENT INJECT    CATARACT EXTRACTION W/PHACO Left 11/21/2017   Procedure: CATARACT EXTRACTION PHACO AND INTRAOCULAR LENS PLACEMENT (IOC);  Surgeon: Mittie Gaskin, MD;  Location: [facility];  Service: Ophthalmology;  Laterality: Left;   COLONOSCOPY WITH PROPOFOL  N/A 01/26/2023   Procedure: COLONOSCOPY WITH PROPOFOL ;  Surgeon: Onita Elspeth Sharper, DO;  Location: [facility] ENDOSCOPY;  Service: Gastroenterology;  Laterality: N/A;   EYE SURGERY Left 11/21/2017   cataract excision   INSERTION OF ANTERIOR SEGMENT AQUEOUS DRAINAGE DEVICE (ISTENT) Right 08/29/2017   Procedure: (ISTENT);  Surgeon: Mittie Gaskin, MD;  Location: [facility];  Service: Ophthalmology;  Laterality: Right;  Diabetic - oral meds   INSERTION OF ANTERIOR SEGMENT AQUEOUS DRAINAGE DEVICE (ISTENT) Left 11/21/2017   Procedure: INSERTION OF ANTERIOR SEGMENT AQUEOUS DRAINAGE DEVICE (ISTENT)  INJECT DIABETES ISTENT INJECT;  Surgeon: Mittie Gaskin, MD;  Location: [facility];  Service: Ophthalmology;  Laterality: Left;  Diabetic - oral meds   JOINT REPLACEMENT     left knee replacement   KNEE ARTHROPLASTY Right 04/30/2023   Procedure: ARTHROPLASTY, KNEE, TOTAL, USING IMAGELESS COMPUTER-ASSISTED NAVIGATION;  Surgeon: Mardee Lynwood SQUIBB, MD;  Location: [facility] ORS;  Service: Orthopedics;  Laterality: Right;   KYPHOPLASTY N/A 03/28/2019   Procedure: L1 KYPHOPLASTY;  Surgeon: Kathlynn Sharper, MD;  Location: [facility] ORS;  Service: Orthopedics;  Laterality: N/A;   LASIK     POLYPECTOMY  01/26/2023   Procedure: POLYPECTOMY;  Surgeon: Onita Elspeth Sharper, DO;  Location: [facility] ENDOSCOPY;  Service: Gastroenterology;;   ROTATOR CUFF REPAIR     TONSILLECTOMY     Patient Active Problem List   Diagnosis Date Noted   SAH (subarachnoid hemorrhage) (HCC) 09/14/2023   SDH (subdural hematoma) (HCC) 09/14/2023   Fall 09/14/2023   Type 2 diabetes mellitus with peripheral neuropathy (HCC) 09/14/2023   Depression 09/14/2023   Focal seizure  (HCC) 09/14/2023   History of total knee arthroplasty, right 04/30/2023   Acute CVA (cerebrovascular accident) (HCC)    Right hemiparesis (HCC) 07/20/2021   Closed compression fracture of body of L1 vertebra (HCC) 04/28/2019   Family history of colon cancer 03/26/2018   Primary osteoarthritis of right knee 09/04/2017   Primary osteoarthritis of left knee 09/04/2017   Status post left partial knee replacement 09/04/2017   OSA on CPAP 01/24/2017   Yellow jacket sting 10/31/2016   HTN (hypertension) 10/31/2016   Type 2 diabetes mellitus with hyperlipidemia (HCC) 10/31/2016   HLD (hyperlipidemia) 10/31/2016   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  Rationale for Evaluation and Treatment: Rehabilitation  SUBJECTIVE:                                                                                                                                                                                             SUBJECTIVE STATEMENT:  Pt reports no changes since last session.   From Eval: Pt had knee replacement in the R side on April 30, 2023. Pt had first fall on 06/2023. Pt had surgery with Dr. Hooten. Pt reports he still has clicking in the right knee and has pain in the right knee that he reports as being worse since before the surgery. Pt reports he is going to see the doctor in a few months again regarding the right knee. Pt reports not walking as well as he used to and notices he moves to the left when he is ambulating. Pt also notes recent unintentional weight loss, has discussed it with MD per his report.   Pt accompanied by: significant other  PERTINENT HISTORY: Pt familiar to clinic previously for post stroke rehab in later half of 2023. Pt has hx of  OA, Diabetes mellitus type 2, controlled, with complications, GERD, Glaucoma    Hemorrhoids  Hyperlipidemia,Hypertension, OSA on CPAP   PAIN:  Are you having pain? Yes: NPRS scale: 7 Pain location: R knee Pain description: constant Aggravating factors: kneeling Relieving factors: n/a  PRECAUTIONS: Fall  RED FLAGS: None   WEIGHT BEARING RESTRICTIONS: No  FALLS: Has patient fallen in last 6 months? Yes. Number of falls 6-7  LIVING ENVIRONMENT: Lives with: lives with their family Lives in: House/apartment Stairs: Yes: External: 7 steps; none Has following equipment at home: Single point cane and Walker - 2 wheeled  PLOF: Independent with basic ADLs and has lessened his activity and uses his wife to assist with things  PATIENT GOALS: Improve balance, gait and mobility   OBJECTIVE:  Note: Objective measures were completed at Evaluation unless otherwise noted.  DIAGNOSTIC FINDINGS: n/a  COGNITION: Overall cognitive status: Within functional limits for tasks assessed   SENSATION: Not tested  LOWER EXTREMITY ROM:     Active  Right Eval Left Eval  Hip flexion    Hip extension    Hip abduction    Hip adduction    Hip internal rotation    Hip external rotation    Knee flexion    Knee extension 15 deg from full ext seated AROM    Ankle dorsiflexion    Ankle plantarflexion    Ankle inversion    Ankle eversion     (Blank rows = not tested)  LOWER EXTREMITY MMT:    MMT Right Eval Left Eval  Hip flexion 3+ 4  Hip extension    Hip abduction 4 4+  Hip adduction 4+ 4+  Hip internal rotation    Hip external rotation    Knee flexion 4 4+  Knee extension 4 4+  Ankle dorsiflexion 4+ 4+  Ankle plantarflexion    Ankle inversion    Ankle eversion    (Blank rows = not tested)  BED MOBILITY:  No reported issues with this  TRANSFERS: Sit to stand: Modified independence and CGA  Assistive device utilized: hands needed to complete     Stand to sit: Modified independence  Assistive device utilized: constols descent with hands         STAIRS: Pt reports using step to pattern with at least one fall on steps in past 6 months.  GAIT: Findings: Gait Characteristics: R knee does get to full extension, decreased step length- Right, decreased step length- Left, decreased stride length, decreased hip/knee flexion- Right, and decreased hip/knee flexion- Left, Distance walked: 30 ft, Level of assistance: Modified independence, and Comments: with turning around pt shows visible instability and mutiple small steps to keep balance, reaches for objects when near them in counter/furniture surfing style walking   FUNCTIONAL TESTS:   Orthostatic assessment: seated 142/76 HR 79 standing 136/73 HR 83  5 times sit to stand: 35.22 sec with heavyUE A Timed up and go (TUG): 19.88 sec  6 minute walk test: : test visit 2  10 meter walk test: .81 m/s BERG test visit 2   PATIENT SURVEYS:  ABC scale: The Activities-Specific Balance Confidence (ABC) Scale 0% 10 20 30  40 50 60 70 80 90 100% No confidence<->completely confident  "How confident are you that you will not lose your balance or become unsteady when you . . .   Date tested 09/12/23 10/31/23  Walk around the house 70% 60  2. Walk up or down stairs 60% 40  3. Bend over and pick up a slipper from in front of a closet floor 30% 30  4. Reach for  a small can off a shelf at eye level 100% 90  5. Stand on tip toes and reach for something above your head 80% 80  6. Stand on a chair and reach for something 0% 10  7. Sweep the floor 40% 40  8. Walk outside the house to a car parked in the driveway 80% 90  9. Get into or out of a car 90% 100  10. Walk across a parking lot to the mall 80% 80  11. Walk up or down a ramp 80% 80  12. Walk in a crowded mall where people rapidly walk past you 70% 80  13. Are bumped into by people as you walk through the mall 70% 60  14. Step onto or off of an escalator while you are holding onto the railing 60% 60  15. Step onto or off an escalator while  holding onto parcels such that you cannot hold onto the railing 60% 0  16. Walk outside on icy sidewalks 10% 0  Total: #/16 61.9% 56.25%                                                                                                                                 TREATMENT DATE: 10/31/23    TherAct- To improve functional movements patterns for everyday tasks   The patient completed 6 minutes at level(s) 2-6 on the NuStep on rolling hills setting using both B UE/B LE and at times just B LE's reciprocal movements to promote strength, endurance, and cardiorespiratory fitness. PT increased the resistance level and monitored the patient's response to the intervention throughout. The patient required min cueing for technique and supervision level of assistance.   BP: 152/79  mmHg with HR 79  STS x 10 reps no UE assist, min A at times for initial propulsion but improved with reps with airex pad in seat  Step up with UE assist to 6 in step 2 x 10 reps ea LE   Standing heel/toe raises with UE support 2x15   TE- To improve strength, endurance, mobility, and function of specific targeted muscle groups or improve joint range of motion or improve muscle flexibility  Seated HS curls with cues for full available knee extension on return motion x10 ea with BTB   NMR: To facilitate reeducation of movement, balance, posture, coordination, and/or proprioception/kinesthetic sense.  1 LE on 6 step with other LE on the floor, 60 sec x3 each LE leading to challenge balance, pt with significant swaying during the attempts     PATIENT EDUCATION: Education details: Pt educated throughout session about proper posture and technique with exercises. Improved exercise technique, movement at target joints, use of target muscles after min to mod verbal, visual, tactile cues Person educated: Patient Education method: Explanation Education comprehension: verbalized understanding   HOME EXERCISE  PROGRAM: Access Code: [email or web address] URL: [email or web address] Date: 09/25/2023 Prepared by: Lonni Gainer  Exercises - Marching Near Counter  -  1 x daily - 7 x weekly - 2 sets - 10 reps - Backward Walking with Counter Support  - 1 x daily - 7 x weekly - 2 sets   GOALS: Goals reviewed with patient? Yes  SHORT TERM GOALS: Target date: 10/10/2023  Patient will be independent in home exercise program to improve strength/mobility for better functional independence with ADLs. Baseline: No HEP currently  Goal status: INITIAL   LONG TERM GOALS: Target date: 12/05/2023  1.  Patient will complete five times sit to stand test in < 15 seconds indicating an increased LE strength and improved balance. Baseline: 35.22 sec  with heavy UE assist Goal status: INITIAL  2.  Patient will improve ABC score to 71.5%   to demonstrate statistically significant improvement in mobility and quality of life as it relates to their balance and mobility.  Baseline: 61.9% Goal status: INITIAL   3.  Patient will increase Berg Balance score by > 6 points to demonstrate decreased fall risk during functional activities. Baseline: test visit 2  Goal status: INITIAL   4.  Patient will reduce timed up and go to <11 seconds to reduce fall risk and demonstrate improved transfer/gait ability. Baseline: 19.88 sec Goal status: INITIAL  5.  Patient will increase 10 meter walk test to >1.61m/s as to improve gait speed for better community ambulation and to reduce fall risk. Baseline: .68m/s Goal status: INITIAL  6.  Patient will increase six minute walk test distance by greater than 150 ft for progression to community ambulator and improve gait ability Baseline: 511 ft Goal status: INITIAL    ASSESSMENT:  CLINICAL IMPRESSION:  Patient arrived with good motivation for completion of pt activities.   Patient continues with lower extremity strength and endurance training this date.  Patient is still  having significant difficulty with sit to stand from standard chair is requiring heavy upper extremity assist as well as having discomfort in his back and knees with the movement.  Patient completed activity specific balance scale this date and showed some regression in his confidence with balance related activities since this was last assessed.  Will assess remainder patient's goals at next treatment session to determine patient's current functional progress since initial evaluation. Pt will continue to benefit from skilled physical therapy intervention to address impairments, improve QOL, and attain therapy goals. Patient's condition has the potential to improve in response to therapy. Maximum improvement is yet to be obtained. The anticipated improvement is attainable and reasonable in a generally predictable time.      OBJECTIVE IMPAIRMENTS: Abnormal gait, decreased activity tolerance, decreased balance, decreased endurance, decreased mobility, difficulty walking, decreased strength, and pain.   ACTIVITY LIMITATIONS: standing, squatting, stairs, transfers, and locomotion level  PARTICIPATION LIMITATIONS: shopping, community activity, and yard work  PERSONAL FACTORS: Age, Behavior pattern, Time since onset of injury/illness/exacerbation, and 3+ comorbidities: DM, HTN, OA, HLD, OSA, R knee pain are also affecting patient's functional outcome.   REHAB POTENTIAL: Good  CLINICAL DECISION MAKING: Evolving/moderate complexity  EVALUATION COMPLEXITY: Moderate  PLAN:  PT FREQUENCY: 2x/week  PT DURATION: 12 weeks  PLANNED INTERVENTIONS: 97750- Physical Performance Testing, 97110-Therapeutic exercises, 97530- Therapeutic activity, [id number]- Neuromuscular re-education, 97535- Self Care, [id number]- Manual therapy, [phone]- Gait training, Balance training, Stair training, Joint mobilization, Cryotherapy, and Moist heat   PLAN FOR NEXT SESSION:   Introduce more ankle exercises. Continues with balance  exercises Monitor symptoms with more strenuous exercises as this seems to be the reasoning behind his symptoms Endurance training and walking  across uneven surfaces    Lonni KATHEE Gainer PT ,DPT Physical Therapist- [facility]  10/31/23, 4:58 PM

## 2023-11-06 ENCOUNTER — Ambulatory Visit

## 2023-11-06 DIAGNOSIS — R41841 Cognitive communication deficit: Secondary | ICD-10-CM

## 2023-11-06 DIAGNOSIS — R262 Difficulty in walking, not elsewhere classified: Secondary | ICD-10-CM

## 2023-11-06 DIAGNOSIS — M6281 Muscle weakness (generalized): Secondary | ICD-10-CM

## 2023-11-06 DIAGNOSIS — G3184 Mild cognitive impairment, so stated: Secondary | ICD-10-CM

## 2023-11-06 DIAGNOSIS — R2689 Other abnormalities of gait and mobility: Secondary | ICD-10-CM

## 2023-11-06 DIAGNOSIS — R2681 Unsteadiness on feet: Secondary | ICD-10-CM

## 2023-11-06 DIAGNOSIS — I639 Cerebral infarction, unspecified: Secondary | ICD-10-CM

## 2023-11-06 DIAGNOSIS — R269 Unspecified abnormalities of gait and mobility: Secondary | ICD-10-CM

## 2023-11-06 NOTE — Therapy (Signed)
 OUTPATIENT PHYSICAL THERAPY NEURO TREATMENT    Patient Name: Vernon Payne. MRN: [id number] DOB:1950-04-24, 73 y.o., male Today's Date: 11/06/2023   PCP: Cleotilde Oneil FALCON, MD  REFERRING PROVIDER: Maree Jannett POUR, MD  END OF SESSION:  PT End of Session - 11/06/23 1154     Visit Number 11    Number of Visits 24    Date for PT Re-Evaluation 12/05/23    Progress Note Due on Visit 10    PT Start Time 1146    PT Stop Time 1230    PT Time Calculation (min) 44 min    Equipment Utilized During Treatment Gait belt    Activity Tolerance Patient tolerated treatment well    Behavior During Therapy WFL for tasks assessed/performed          Past Medical History:  Diagnosis Date   Actinic keratosis    Arthritis    Diabetes mellitus without complication (HCC)    GERD (gastroesophageal reflux disease)    Hard of hearing    left hearing aid   HLD (hyperlipidemia)    HTN (hypertension)    Hypotension, postural    IBS (irritable bowel syndrome)    Primary osteoarthritis of right knee    Sleep apnea    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 Tx pending   Squamous cell carcinoma of skin 01/20/2010   Right chest. Well differentiated. Excised: 01/27/2010   Squamous cell carcinoma of skin 07/15/2019   R preauricular ant to lobe   Squamous cell carcinoma of skin 10/18/2023   Left posterior helix. KA type. Mohs pending.   Stroke [facility])    May 31/ 2023   Wears dentures    partial upper, implants on bottom   Past Surgical History:  Procedure Laterality Date   APPENDECTOMY     BACK SURGERY     lumbar   CATARACT EXTRACTION W/PHACO Right 08/29/2017   Procedure: CATARACT EXTRACTION PHACO AND INTRAOCULAR LENS PLACEMENT (IOC) RIGHT DIABETIC;  Surgeon: Mittie Gaskin, MD;  Location: [facility];  Service: Ophthalmology;  Laterality: Right;  ISTENT INJECT   CATARACT EXTRACTION W/PHACO Left 11/21/2017   Procedure: CATARACT EXTRACTION PHACO  AND INTRAOCULAR LENS PLACEMENT (IOC);  Surgeon: Mittie Gaskin, MD;  Location: [facility];  Service: Ophthalmology;  Laterality: Left;   COLONOSCOPY WITH PROPOFOL  N/A 01/26/2023   Procedure: COLONOSCOPY WITH PROPOFOL ;  Surgeon: Onita Elspeth Sharper, DO;  Location: [facility] END[facility];  Service: Gastroenterology;  Laterality: N/A;   EYE SURGERY Left 11/21/2017   cataract excision   INSERTION OF ANTERIOR SEGMENT AQUEOUS DRAINAGE DEVICE (ISTENT) Right 08/29/2017   Procedure: (ISTENT);  Surgeon: Mittie Gaskin, MD;  Location: [facility];  Service: Ophthalmology;  Laterality: Right;  Diabetic - oral meds   INSERTION OF ANTERIOR SEGMENT AQUEOUS DRAINAGE DEVICE (ISTENT) Left 11/21/2017   Procedure: INSERTION OF ANTERIOR SEGMENT AQUEOUS DRAINAGE DEVICE (ISTENT)  INJECT DIABETES ISTENT INJECT;  Surgeon: Mittie Gaskin, MD;  Location: [facility];  Service: Ophthalmology;  Laterality: Left;  Diabetic - oral meds   JOINT REPLACEMENT     left knee replacement   KNEE ARTHROPLASTY Right 04/30/2023   Procedure: ARTHROPLASTY, KNEE, TOTAL, USING IMAGELESS COMPUTER-ASSISTED NAVIGATION;  Surgeon: Mardee Lynwood SQUIBB, MD;  Location: [facility] ORS;  Service: Orthopedics;  Laterality: Right;   KYPHOPLASTY N/A 03/28/2019   Procedure: L1 KYPHOPLASTY;  Surgeon: Kathlynn Sharper, MD;  Location: [facility] OR[facility];  Service: Orthopedics;  Laterality: N/A;   LASIK  POLYPECTOMY  01/26/2023   Procedure: POLYPECTOMY;  Surgeon: Onita Elspeth Sharper, DO;  Location: [facility] ENDOSCOPY;  Service: Gastroenterology;;   ROTATOR CUFF REPAIR     TONSILLECTOMY     Patient Active Problem List   Diagnosis Date Noted   SAH (subarachnoid hemorrhage) (HCC) 09/14/2023   SDH (subdural hematoma) (HCC) 09/14/2023   Fall 09/14/2023   Type 2 diabetes mellitus with peripheral neuropathy (HCC) 09/14/2023   Depression 09/14/2023   Focal seizure (HCC) 09/14/2023   History of total knee arthroplasty, right 04/30/2023   Acute CVA  (cerebrovascular accident) (HCC)    Right hemiparesis (HCC) 07/20/2021   Closed compression fracture of body of L1 vertebra (HCC) 04/28/2019   Family history of colon cancer 03/26/2018   Primary osteoarthritis of right knee 09/04/2017   Primary osteoarthritis of left knee 09/04/2017   Status post left partial knee replacement 09/04/2017   OSA on CPAP 01/24/2017   Yellow jacket sting 10/31/2016   HTN (hypertension) 10/31/2016   Type 2 diabetes mellitus with hyperlipidemia (HCC) 10/31/2016   HLD (hyperlipidemia) 10/31/2016   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   Benign essential HTN 10/15/2013    ONSET DATE: 04/30/23  REFERRING DIAG: R26.89 (ICD-10-CM) - Imbalance   THERAPY DIAG:  Difficulty in walking, not elsewhere classified  Abnormality of gait and mobility  Other abnormalities of gait and mobility  Unsteadiness on feet  Muscle weakness (generalized)  Acute CVA (cerebrovascular accident) (HCC)  Cognitive communication deficit  Mild cognitive impairment  Rationale for Evaluation and Treatment: Rehabilitation  SUBJECTIVE:                                                                                                                                                                                             SUBJECTIVE STATEMENT:  Pt has been put on Losartan and took his first dose about 30 minutes prior before coming into the clinic.  Pt is hoping to get the blood pressure under control.  From Eval: Pt had knee replacement in the R side on April 30, 2023. Pt had first fall on 06/2023. Pt had surgery with Dr. Hooten. Pt reports he still has clicking in the right knee and has pain in the right knee that he reports as being worse since before the surgery. Pt reports he is going to see the doctor in a few months again regarding the right knee. Pt reports not walking as well as he used to and notices he moves to the left when he is ambulating. Pt also notes recent  unintentional weight loss, has discussed it with MD per his report.   Pt accompanied  by: significant other  PERTINENT HISTORY: Pt familiar to clinic previously for post stroke rehab in later half of 2023. Pt has hx of  OA, Diabetes mellitus type 2, controlled, with complications, GERD, Glaucoma   Hemorrhoids   Hyperlipidemia,Hypertension, OSA on CPAP   PAIN:  Are you having pain? Yes: NPRS scale: 7 Pain location: R knee Pain description: constant Aggravating factors: kneeling Relieving factors: n/a  PRECAUTIONS: Fall  RED FLAGS: None   WEIGHT BEARING RESTRICTIONS: No  FALLS: Has patient fallen in last 6 months? Yes. Number of falls 6-7  LIVING ENVIRONMENT: Lives with: lives with their family Lives in: House/apartment Stairs: Yes: External: 7 steps; none Has following equipment at home: Single point cane and Walker - 2 wheeled  PLOF: Independent with basic ADLs and has lessened his activity and uses his wife to assist with things  PATIENT GOALS: Improve balance, gait and mobility   OBJECTIVE:  Note: Objective measures were completed at Evaluation unless otherwise noted.  DIAGNOSTIC FINDINGS: n/a  COGNITION: Overall cognitive status: Within functional limits for tasks assessed   SENSATION: Not tested  LOWER EXTREMITY ROM:     Active  Right Eval Left Eval  Hip flexion    Hip extension    Hip abduction    Hip adduction    Hip internal rotation    Hip external rotation    Knee flexion    Knee extension 15 deg from full ext seated AROM    Ankle dorsiflexion    Ankle plantarflexion    Ankle inversion    Ankle eversion     (Blank rows = not tested)  LOWER EXTREMITY MMT:    MMT Right Eval Left Eval  Hip flexion 3+ 4  Hip extension    Hip abduction 4 4+  Hip adduction 4+ 4+  Hip internal rotation    Hip external rotation    Knee flexion 4 4+  Knee extension 4 4+  Ankle dorsiflexion 4+ 4+  Ankle plantarflexion    Ankle inversion    Ankle eversion     (Blank rows = not tested)  BED MOBILITY:  No reported issues with this  TRANSFERS: Sit to stand: Modified independence and CGA  Assistive device utilized: hands needed to complete     Stand to sit: Modified independence  Assistive device utilized: constols descent with hands        STAIRS: Pt reports using step to pattern with at least one fall on steps in past 6 months.  GAIT: Findings: Gait Characteristics: R knee does get to full extension, decreased step length- Right, decreased step length- Left, decreased stride length, decreased hip/knee flexion- Right, and decreased hip/knee flexion- Left, Distance walked: 30 ft, Level of assistance: Modified independence, and Comments: with turning around pt shows visible instability and mutiple small steps to keep balance, reaches for objects when near them in counter/furniture surfing style walking   FUNCTIONAL TESTS:   Orthostatic assessment: seated 142/76 HR 79 standing 136/73 HR 83  5 times sit to stand: 35.22 sec with heavyUE A Timed up and go (TUG): 19.88 sec  6 minute walk test: : test visit 2  10 meter walk test: .81 m/s BERG test visit 2   PATIENT SURVEYS:  ABC scale: The Activities-Specific Balance Confidence (ABC) Scale 0% 10 20 30  40 50 60 70 80 90 100% No confidence<->completely confident  "How confident are you that you will not lose your balance or become unsteady when you . . .   Date tested  09/12/23 10/31/23  Walk around the house 70% 60  2. Walk up or down stairs 60% 40  3. Bend over and pick up a slipper from in front of a closet floor 30% 30  4. Reach for a small can off a shelf at eye level 100% 90  5. Stand on tip toes and reach for something above your head 80% 80  6. Stand on a chair and reach for something 0% 10  7. Sweep the floor 40% 40  8. Walk outside the house to a car parked in the driveway 80% 90  9. Get into or out of a car 90% 100  10. Walk across a parking lot to the mall 80% 80  11. Walk up  or down a ramp 80% 80  12. Walk in a crowded mall where people rapidly walk past you 70% 80  13. Are bumped into by people as you walk through the mall 70% 60  14. Step onto or off of an escalator while you are holding onto the railing 60% 60  15. Step onto or off an escalator while holding onto parcels such that you cannot hold onto the railing 60% 0  16. Walk outside on icy sidewalks 10% 0  Total: #/16 61.9% 56.25%                                                                                                                                 TREATMENT DATE: 11/06/23   TherAct- To improve functional movements patterns for everyday tasks   The patient completed 8 minutes at level(s) 1-6 on the NuStep on rolling hills setting using both B UE/B LE and at times just B LE's reciprocal movements to promote strength, endurance, and cardiorespiratory fitness. PT increased the resistance level and monitored the patient's response to the intervention throughout. The patient required min cueing for technique and supervision level of assistance.   BP: 161/83  mmHg with HR 66 at the conclusion of the NuStep training  BP: 143/86  mmHg with HR 67 following seated rest break:   STS x 10 reps no UE assist, min A at times for initial propulsion but improved with reps with airex pad in seat  Ambulation around the gym x2 for improved endurance levels    NMR: To facilitate reeducation of movement, balance, posture, coordination, and/or proprioception/kinesthetic sense.  The patient participated in the PopDot game on the [facility] platform for 14 minutes at stability level 0.0-3.0. This interactive game requires the patient to quickly shift their weight and balance to "pop" virtual dots appearing in various locations on the screen. The activity challenges dynamic balance, reaction time, coordination, and weight shifting in a fun, engaging environment. The patient performed the task with supervision  assistance with minimal verbal cueing and demonstrated good use of ankle mobility to perform the tasks.          PATIENT EDUCATION: Education details: Pt educated throughout  session about proper posture and technique with exercises. Improved exercise technique, movement at target joints, use of target muscles after min to mod verbal, visual, tactile cues Person educated: Patient Education method: Explanation Education comprehension: verbalized understanding   HOME EXERCISE PROGRAM: Access Code: [email or web address] URL: [email or web address] Date: 09/25/2023 Prepared by: Lonni Gainer  Exercises - Marching Near Counter  - 1 x daily - 7 x weekly - 2 sets - 10 reps - Backward Walking with Counter Support  - 1 x daily - 7 x weekly - 2 sets   GOALS: Goals reviewed with patient? Yes  SHORT TERM GOALS: Target date: 10/10/2023  Patient will be independent in home exercise program to improve strength/mobility for better functional independence with ADLs. Baseline: No HEP currently  Goal status: INITIAL   LONG TERM GOALS: Target date: 12/05/2023  1.  Patient will complete five times sit to stand test in < 15 seconds indicating an increased LE strength and improved balance. Baseline: 35.22 sec  with heavy UE assist Goal status: INITIAL  2.  Patient will improve ABC score to 71.5%   to demonstrate statistically significant improvement in mobility and quality of life as it relates to their balance and mobility.  Baseline: 61.9% Goal status: INITIAL   3.  Patient will increase Berg Balance score by > 6 points to demonstrate decreased fall risk during functional activities. Baseline: test visit 2  Goal status: INITIAL   4.  Patient will reduce timed up and go to <11 seconds to reduce fall risk and demonstrate improved transfer/gait ability. Baseline: 19.88 sec Goal status: INITIAL  5.  Patient will increase 10 meter walk test to >1.53m/s as to improve gait speed for better  community ambulation and to reduce fall risk. Baseline: .24m/s Goal status: INITIAL  6.  Patient will increase six minute walk test distance by greater than 150 ft for progression to community ambulator and improve gait ability Baseline: 511 ft Goal status: INITIAL    ASSESSMENT:  CLINICAL IMPRESSION:  Pt moved a little slower this morning, seeming to be a little more fatigued than in the previous sessions.  This may be due to the change in BP medication and that was assessed and noted above.  Pt overall still performed well without any instances of instability.  Pt continues to need endurance training to improve overall tolerance levels necessary for travel.   Pt will continue to benefit from skilled therapy to address remaining deficits in order to improve overall QoL and return to PLOF.        OBJECTIVE IMPAIRMENTS: Abnormal gait, decreased activity tolerance, decreased balance, decreased endurance, decreased mobility, difficulty walking, decreased strength, and pain.   ACTIVITY LIMITATIONS: standing, squatting, stairs, transfers, and locomotion level  PARTICIPATION LIMITATIONS: shopping, community activity, and yard work  PERSONAL FACTORS: Age, Behavior pattern, Time since onset of injury/illness/exacerbation, and 3+ comorbidities: DM, HTN, OA, HLD, OSA, R knee pain are also affecting patient's functional outcome.   REHAB POTENTIAL: Good  CLINICAL DECISION MAKING: Evolving/moderate complexity  EVALUATION COMPLEXITY: Moderate  PLAN:  PT FREQUENCY: 2x/week  PT DURATION: 12 weeks  PLANNED INTERVENTIONS: [id number]- Physical Performance Testing, 97110-Therapeutic exercises, 97530- Therapeutic activity, [id number]- Neuromuscular re-education, 97535- Self Care, [id number]- Manual therapy, [phone]- Gait training, Balance training, Stair training, Joint mobilization, Cryotherapy, and Moist heat   PLAN FOR NEXT SESSION:   Introduce more ankle exercises. Continues with balance  exercises Monitor symptoms with more strenuous exercises as this seems to be the reasoning behind his symptoms Endurance training and  walking across uneven surfaces   Fonda Simpers, PT, DPT Physical Therapist - [facility]  [facility]  11/06/23, 11:54 AM

## 2023-11-08 ENCOUNTER — Ambulatory Visit: Admitting: Physical Therapy

## 2023-11-08 DIAGNOSIS — M6281 Muscle weakness (generalized): Secondary | ICD-10-CM | POA: Diagnosis not present

## 2023-11-08 DIAGNOSIS — R2689 Other abnormalities of gait and mobility: Secondary | ICD-10-CM

## 2023-11-08 DIAGNOSIS — R262 Difficulty in walking, not elsewhere classified: Secondary | ICD-10-CM

## 2023-11-08 DIAGNOSIS — R269 Unspecified abnormalities of gait and mobility: Secondary | ICD-10-CM

## 2023-11-08 DIAGNOSIS — R2681 Unsteadiness on feet: Secondary | ICD-10-CM

## 2023-11-08 NOTE — Therapy (Unsigned)
 OUTPATIENT PHYSICAL THERAPY NEURO TREATMENT    Patient Name: Vernon Payne. MRN: [id number] DOB:Jun 16, 1950, 73 y.o., male Today's Date: 11/08/2023   PCP: Cleotilde Oneil FALCON, MD  REFERRING PROVIDER: Maree Jannett POUR, MD  END OF SESSION:  PT End of Session - 11/08/23 1536     Visit Number 12    Number of Visits 24    Date for Recertification  12/05/23    Progress Note Due on Visit 20    PT Start Time 1533    PT Stop Time 1613    PT Time Calculation (min) 40 min    Equipment Utilized During Treatment Gait belt    Activity Tolerance Patient tolerated treatment well    Behavior During Therapy WFL for tasks assessed/performed          Past Medical History:  Diagnosis Date   Actinic keratosis    Arthritis    Diabetes mellitus without complication (HCC)    GERD (gastroesophageal reflux disease)    Hard of hearing    left hearing aid   HLD (hyperlipidemia)    HTN (hypertension)    Hypotension, postural    IBS (irritable bowel syndrome)    Primary osteoarthritis of right knee    Sleep apnea    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 Tx pending   Squamous cell carcinoma of skin 01/20/2010   Right chest. Well differentiated. Excised: 01/27/2010   Squamous cell carcinoma of skin 07/15/2019   R preauricular ant to lobe   Squamous cell carcinoma of skin 10/18/2023   Left posterior helix. KA type. Mohs pending.   Stroke [facility])    May 31/ 2023   Wears dentures    partial upper, implants on bottom   Past Surgical History:  Procedure Laterality Date   APPENDECTOMY     BACK SURGERY     lumbar   CATARACT EXTRACTION W/PHACO Right 08/29/2017   Procedure: CATARACT EXTRACTION PHACO AND INTRAOCULAR LENS PLACEMENT (IOC) RIGHT DIABETIC;  Surgeon: Mittie Gaskin, MD;  Location: [facility];  Service: Ophthalmology;  Laterality: Right;  ISTENT INJECT   CATARACT EXTRACTION W/PHACO Left 11/21/2017   Procedure: CATARACT EXTRACTION PHACO  AND INTRAOCULAR LENS PLACEMENT (IOC);  Surgeon: Mittie Gaskin, MD;  Location: [facility];  Service: Ophthalmology;  Laterality: Left;   COLONOSCOPY WITH PROPOFOL  N/A 01/26/2023   Procedure: COLONOSCOPY WITH PROPOFOL ;  Surgeon: Onita Elspeth Sharper, DO;  Location: [facility];  Service: Gastroenterology;  Laterality: N/A;   EYE SURGERY Left 11/21/2017   cataract excision   INSERTION OF ANTERIOR SEGMENT AQUEOUS DRAINAGE DEVICE (ISTENT) Right 08/29/2017   Procedure: (ISTENT);  Surgeon: Mittie Gaskin, MD;  Location: [facility];  Service: Ophthalmology;  Laterality: Right;  Diabetic - oral meds   INSERTION OF ANTERIOR SEGMENT AQUEOUS DRAINAGE DEVICE (ISTENT) Left 11/21/2017   Procedure: INSERTION OF ANTERIOR SEGMENT AQUEOUS DRAINAGE DEVICE (ISTENT)  INJECT DIABETES ISTENT INJECT;  Surgeon: Mittie Gaskin, MD;  Location: [facility];  Service: Ophthalmology;  Laterality: Left;  Diabetic - oral meds   JOINT REPLACEMENT     left knee replacement   KNEE ARTHROPLASTY Right 04/30/2023   Procedure: ARTHROPLASTY, KNEE, TOTAL, USING IMAGELESS COMPUTER-ASSISTED NAVIGATION;  Surgeon: Mardee Lynwood SQUIBB, MD;  Location: [facility] ORS;  Service: Orthopedics;  Laterality: Right;   KYPHOPLASTY N/A 03/28/2019   Procedure: L1 KYPHOPLASTY;  Surgeon: Kathlynn Sharper, MD;  Location: [facility];  Service: Orthopedics;  Laterality: N/A;   LASIK  POLYPECTOMY  01/26/2023   Procedure: POLYPECTOMY;  Surgeon: Onita Elspeth Sharper, DO;  Location: [facility] END[facility];  Service: Gastroenterology;;   ROTATOR CUFF REPAIR     TONSILLECTOMY     Patient Active Problem List   Diagnosis Date Noted   SAH (subarachnoid hemorrhage) (HCC) 09/14/2023   SDH (subdural hematoma) (HCC) 09/14/2023   Fall 09/14/2023   Type 2 diabetes mellitus with peripheral neuropathy (HCC) 09/14/2023   Depression 09/14/2023   Focal seizure (HCC) 09/14/2023   History of total knee arthroplasty, right 04/30/2023   Acute CVA  (cerebrovascular accident) (HCC)    Right hemiparesis (HCC) 07/20/2021   Closed compression fracture of body of L1 vertebra (HCC) 04/28/2019   Family history of colon cancer 03/26/2018   Primary osteoarthritis of right knee 09/04/2017   Primary osteoarthritis of left knee 09/04/2017   Status post left partial knee replacement 09/04/2017   OSA on CPAP 01/24/2017   Yellow jacket sting 10/31/2016   HTN (hypertension) 10/31/2016   Type 2 diabetes mellitus with hyperlipidemia (HCC) 10/31/2016   HLD (hyperlipidemia) 10/31/2016   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   Benign essential HTN 10/15/2013    ONSET DATE: 04/30/23  REFERRING DIAG: R26.89 (ICD-10-CM) - Imbalance   THERAPY DIAG:  Difficulty in walking, not elsewhere classified  Abnormality of gait and mobility  Other abnormalities of gait and mobility  Unsteadiness on feet  Muscle weakness (generalized)  Rationale for Evaluation and Treatment: Rehabilitation  SUBJECTIVE:                                                                                                                                                                                             SUBJECTIVE STATEMENT:  Pt has been put on Losartan and took his first dose about 30 minutes prior before coming into the clinic.  Pt is hoping to get the blood pressure under control.  From Eval: Pt had knee replacement in the R side on April 30, 2023. Pt had first fall on 06/2023. Pt had surgery with Dr. Hooten. Pt reports he still has clicking in the right knee and has pain in the right knee that he reports as being worse since before the surgery. Pt reports he is going to see the doctor in a few months again regarding the right knee. Pt reports not walking as well as he used to and notices he moves to the left when he is ambulating. Pt also notes recent unintentional weight loss, has discussed it with MD per his report.   Pt accompanied by: significant  other  PERTINENT HISTORY: Pt familiar to clinic previously for post stroke  rehab in later half of 2023. Pt has hx of  OA, Diabetes mellitus type 2, controlled, with complications, GERD, Glaucoma   Hemorrhoids   Hyperlipidemia,Hypertension, OSA on CPAP   PAIN:  Are you having pain? Yes: NPRS scale: 7 Pain location: R knee Pain description: constant Aggravating factors: kneeling Relieving factors: n/a  PRECAUTIONS: Fall  RED FLAGS: None   WEIGHT BEARING RESTRICTIONS: No  FALLS: Has patient fallen in last 6 months? Yes. Number of falls 6-7  LIVING ENVIRONMENT: Lives with: lives with their family Lives in: House/apartment Stairs: Yes: External: 7 steps; none Has following equipment at home: Single point cane and Walker - 2 wheeled  PLOF: Independent with basic ADLs and has lessened his activity and uses his wife to assist with things  PATIENT GOALS: Improve balance, gait and mobility   OBJECTIVE:  Note: Objective measures were completed at Evaluation unless otherwise noted.  DIAGNOSTIC FINDINGS: n/a  COGNITION: Overall cognitive status: Within functional limits for tasks assessed   SENSATION: Not tested  LOWER EXTREMITY ROM:     Active  Right Eval Left Eval  Hip flexion    Hip extension    Hip abduction    Hip adduction    Hip internal rotation    Hip external rotation    Knee flexion    Knee extension 15 deg from full ext seated AROM    Ankle dorsiflexion    Ankle plantarflexion    Ankle inversion    Ankle eversion     (Blank rows = not tested)  LOWER EXTREMITY MMT:    MMT Right Eval Left Eval  Hip flexion 3+ 4  Hip extension    Hip abduction 4 4+  Hip adduction 4+ 4+  Hip internal rotation    Hip external rotation    Knee flexion 4 4+  Knee extension 4 4+  Ankle dorsiflexion 4+ 4+  Ankle plantarflexion    Ankle inversion    Ankle eversion    (Blank rows = not tested)  BED MOBILITY:  No reported issues with this  TRANSFERS: Sit to stand:  Modified independence and CGA  Assistive device utilized: hands needed to complete     Stand to sit: Modified independence  Assistive device utilized: constols descent with hands        STAIRS: Pt reports using step to pattern with at least one fall on steps in past 6 months.  GAIT: Findings: Gait Characteristics: R knee does get to full extension, decreased step length- Right, decreased step length- Left, decreased stride length, decreased hip/knee flexion- Right, and decreased hip/knee flexion- Left, Distance walked: 30 ft, Level of assistance: Modified independence, and Comments: with turning around pt shows visible instability and mutiple small steps to keep balance, reaches for objects when near them in counter/furniture surfing style walking   FUNCTIONAL TESTS:   Orthostatic assessment: seated 142/76 HR 79 standing 136/73 HR 83  5 times sit to stand: 35.22 sec with heavyUE A Timed up and go (TUG): 19.88 sec  6 minute walk test: : test visit 2  10 meter walk test: .81 m/s BERG test visit 2   PATIENT SURVEYS:  ABC scale: The Activities-Specific Balance Confidence (ABC) Scale 0% 10 20 30  40 50 60 70 80 90 100% No confidence<->completely confident  "How confident are you that you will not lose your balance or become unsteady when you . . .   Date tested 09/12/23 10/31/23  Walk around the house 70% 60  2. Walk up or  down stairs 60% 40  3. Bend over and pick up a slipper from in front of a closet floor 30% 30  4. Reach for a small can off a shelf at eye level 100% 90  5. Stand on tip toes and reach for something above your head 80% 80  6. Stand on a chair and reach for something 0% 10  7. Sweep the floor 40% 40  8. Walk outside the house to a car parked in the driveway 80% 90  9. Get into or out of a car 90% 100  10. Walk across a parking lot to the mall 80% 80  11. Walk up or down a ramp 80% 80  12. Walk in a crowded mall where people rapidly walk past you 70% 80  13. Are  bumped into by people as you walk through the mall 70% 60  14. Step onto or off of an escalator while you are holding onto the railing 60% 60  15. Step onto or off an escalator while holding onto parcels such that you cannot hold onto the railing 60% 0  16. Walk outside on icy sidewalks 10% 0  Total: #/16 61.9% 56.25%                                                                                                                                 TREATMENT DATE: 11/08/23   TherAct- To improve functional movements patterns for everyday tasks   The patient completed 8 minutes at level(s) 1-6 on the NuStep on rolling hills setting using both B LE  promote strength, endurance, and cardiorespiratory fitness. PT increased the resistance level and monitored the patient's response to the intervention throughout. The patient required min cueing for technique and supervision level of assistance.  STS from elevated plinth x 10 reps, cues for forward weight shift and proper arm movements for best mechanical advantage   BP: 128/86  mmHg with HR 82  STS from elevated plinth x 10 reps, cues for forward weight shift and proper arm movements for best mechanical advantage   On elevated plinth LAQ with 4# AW 3 x 10   - Marching with 4# - core control and quad strength 2 x 15     NMR: To facilitate reeducation of movement, balance, posture, coordination, and/or proprioception/kinesthetic sense.  The patient participated in the PopDot game on the [facility] platform for 14 minutes at stability level 0.0-3.0. This interactive game requires the patient to quickly shift their weight and balance to "pop" virtual dots appearing in various locations on the screen. The activity challenges dynamic balance, reaction time, coordination, and weight shifting in a fun, engaging environment. The patient performed the task with supervision assistance with minimal verbal cueing and demonstrated good use of ankle mobility to  perform the tasks.          PATIENT EDUCATION: Education details: Pt educated throughout session about proper posture and technique with  exercises. Improved exercise technique, movement at target joints, use of target muscles after min to mod verbal, visual, tactile cues Person educated: Patient Education method: Explanation Education comprehension: verbalized understanding   HOME EXERCISE PROGRAM: Access Code: [email or web address] URL: [email or web address] Date: 09/25/2023 Prepared by: Lonni Gainer  Exercises - Marching Near Counter  - 1 x daily - 7 x weekly - 2 sets - 10 reps - Backward Walking with Counter Support  - 1 x daily - 7 x weekly - 2 sets   GOALS: Goals reviewed with patient? Yes  SHORT TERM GOALS: Target date: 10/10/2023  Patient will be independent in home exercise program to improve strength/mobility for better functional independence with ADLs. Baseline: No HEP currently  Goal status: INITIAL   LONG TERM GOALS: Target date: 12/05/2023  1.  Patient will complete five times sit to stand test in < 15 seconds indicating an increased LE strength and improved balance. Baseline: 35.22 sec  with heavy UE assist Goal status: INITIAL  2.  Patient will improve ABC score to 71.5%   to demonstrate statistically significant improvement in mobility and quality of life as it relates to their balance and mobility.  Baseline: 61.9% Goal status: INITIAL   3.  Patient will increase Berg Balance score by > 6 points to demonstrate decreased fall risk during functional activities. Baseline: test visit 2  Goal status: INITIAL   4.  Patient will reduce timed up and go to <11 seconds to reduce fall risk and demonstrate improved transfer/gait ability. Baseline: 19.88 sec Goal status: INITIAL  5.  Patient will increase 10 meter walk test to >1.28m/s as to improve gait speed for better community ambulation and to reduce fall risk. Baseline: .1m/s Goal status:  INITIAL  6.  Patient will increase six minute walk test distance by greater than 150 ft for progression to community ambulator and improve gait ability Baseline: 511 ft Goal status: INITIAL    ASSESSMENT:  CLINICAL IMPRESSION:  Pt moved a little slower this morning, seeming to be a little more fatigued than in the previous sessions.  This may be due to the change in BP medication and that was assessed and noted above.  Pt overall still performed well without any instances of instability.  Pt continues to need endurance training to improve overall tolerance levels necessary for travel.   Pt will continue to benefit from skilled therapy to address remaining deficits in order to improve overall QoL and return to PLOF.     OBJECTIVE IMPAIRMENTS: Abnormal gait, decreased activity tolerance, decreased balance, decreased endurance, decreased mobility, difficulty walking, decreased strength, and pain.   ACTIVITY LIMITATIONS: standing, squatting, stairs, transfers, and locomotion level  PARTICIPATION LIMITATIONS: shopping, community activity, and yard work  PERSONAL FACTORS: Age, Behavior pattern, Time since onset of injury/illness/exacerbation, and 3+ comorbidities: DM, HTN, OA, HLD, OSA, R knee pain are also affecting patient's functional outcome.   REHAB POTENTIAL: Good  CLINICAL DECISION MAKING: Evolving/moderate complexity  EVALUATION COMPLEXITY: Moderate  PLAN:  PT FREQUENCY: 2x/week  PT DURATION: 12 weeks  PLANNED INTERVENTIONS: 97750- Physical Performance Testing, 97110-Therapeutic exercises, 97530- Therapeutic activity, [id number]- Neuromuscular re-education, 97535- Self Care, [id number]- Manual therapy, [phone]- Gait training, Balance training, Stair training, Joint mobilization, Cryotherapy, and Moist heat   PLAN FOR NEXT SESSION:   Introduce more ankle exercises. Continues with balance exercises Monitor symptoms with more strenuous exercises as this seems to be the reasoning behind  his symptoms Endurance training and walking across uneven surfaces   Note: Portions of this  document were prepared using Dragon voice recognition software and although reviewed may contain unintentional dictation errors in syntax, grammar, or spelling.  Lonni KATHEE Gainer PT ,DPT Physical Therapist- [facility]   11/08/23, 3:37 PM

## 2023-11-12 ENCOUNTER — Ambulatory Visit: Admitting: Dermatology

## 2023-11-13 ENCOUNTER — Ambulatory Visit: Admitting: Physical Therapy

## 2023-11-13 DIAGNOSIS — R262 Difficulty in walking, not elsewhere classified: Secondary | ICD-10-CM

## 2023-11-13 DIAGNOSIS — R269 Unspecified abnormalities of gait and mobility: Secondary | ICD-10-CM

## 2023-11-13 DIAGNOSIS — M6281 Muscle weakness (generalized): Secondary | ICD-10-CM

## 2023-11-13 DIAGNOSIS — R2689 Other abnormalities of gait and mobility: Secondary | ICD-10-CM

## 2023-11-13 DIAGNOSIS — R2681 Unsteadiness on feet: Secondary | ICD-10-CM

## 2023-11-13 NOTE — Therapy (Unsigned)
 OUTPATIENT PHYSICAL THERAPY NEURO TREATMENT    Patient Name: Vernon Payne. MRN: [id number] DOB:March 17, 1950, 73 y.o., male 57 Date: 11/14/2023   PCP: Cleotilde Oneil FALCON, MD  REFERRING PROVIDER: Maree Jannett POUR, MD  END OF SESSION:  PT End of Session - 11/14/23 0807     Visit Number 13    Number of Visits 24    Date for Recertification  12/05/23    Progress Note Due on Visit 20    PT Start Time 1617    PT Stop Time 1658    PT Time Calculation (min) 41 min    Equipment Utilized During Treatment Gait belt    Activity Tolerance Patient tolerated treatment well    Behavior During Therapy WFL for tasks assessed/performed           Past Medical History:  Diagnosis Date   Actinic keratosis    Arthritis    Diabetes mellitus without complication (HCC)    GERD (gastroesophageal reflux disease)    Hard of hearing    left hearing aid   HLD (hyperlipidemia)    HTN (hypertension)    Hypotension, postural    IBS (irritable bowel syndrome)    Primary osteoarthritis of right knee    Sleep apnea    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 Tx pending   Squamous cell carcinoma of skin 01/20/2010   Right chest. Well differentiated. Excised: 01/27/2010   Squamous cell carcinoma of skin 07/15/2019   R preauricular ant to lobe   Squamous cell carcinoma of skin 10/18/2023   Left posterior helix. KA type. Mohs pending.   Stroke [facility])    May 31/ 2023   Wears dentures    partial upper, implants on bottom   Past Surgical History:  Procedure Laterality Date   APPENDECTOMY     BACK SURGERY     lumbar   CATARACT EXTRACTION W/PHACO Right 08/29/2017   Procedure: CATARACT EXTRACTION PHACO AND INTRAOCULAR LENS PLACEMENT (IOC) RIGHT DIABETIC;  Surgeon: Mittie Gaskin, MD;  Location: [facility];  Service: Ophthalmology;  Laterality: Right;  ISTENT INJECT   CATARACT EXTRACTION W/PHACO Left 11/21/2017   Procedure: CATARACT EXTRACTION PHACO  AND INTRAOCULAR LENS PLACEMENT (IOC);  Surgeon: Mittie Gaskin, MD;  Location: [facility];  Service: Ophthalmology;  Laterality: Left;   COLONOSCOPY WITH PROPOFOL  N/A 01/26/2023   Procedure: COLONOSCOPY WITH PROPOFOL ;  Surgeon: Onita Elspeth Sharper, DO;  Location: [facility] ENDOSCOPY;  Service: Gastroenterology;  Laterality: N/A;   EYE SURGERY Left 11/21/2017   cataract excision   INSERTION OF ANTERIOR SEGMENT AQUEOUS DRAINAGE DEVICE (ISTENT) Right 08/29/2017   Procedure: (ISTENT);  Surgeon: Mittie Gaskin, MD;  Location: [facility];  Service: Ophthalmology;  Laterality: Right;  Diabetic - oral meds   INSERTION OF ANTERIOR SEGMENT AQUEOUS DRAINAGE DEVICE (ISTENT) Left 11/21/2017   Procedure: INSERTION OF ANTERIOR SEGMENT AQUEOUS DRAINAGE DEVICE (ISTENT)  INJECT DIABETES ISTENT INJECT;  Surgeon: Mittie Gaskin, MD;  Location: [facility];  Service: Ophthalmology;  Laterality: Left;  Diabetic - oral meds   JOINT REPLACEMENT     left knee replacement   KNEE ARTHROPLASTY Right 04/30/2023   Procedure: ARTHROPLASTY, KNEE, TOTAL, USING IMAGELESS COMPUTER-ASSISTED NAVIGATION;  Surgeon: Mardee Lynwood SQUIBB, MD;  Location: [facility] ORS;  Service: Orthopedics;  Laterality: Right;   KYPHOPLASTY N/A 03/28/2019   Procedure: L1 KYPHOPLASTY;  Surgeon: Kathlynn Sharper, MD;  Location: [facility] ORS;  Service: Orthopedics;  Laterality: N/A;   LASIK  POLYPECTOMY  01/26/2023   Procedure: POLYPECTOMY;  Surgeon: Onita Elspeth Sharper, DO;  Location: [facility];  Service: Gastroenterology;;   ROTATOR CUFF REPAIR     TONSILLECTOMY     Patient Active Problem List   Diagnosis Date Noted   SAH (subarachnoid hemorrhage) (HCC) 09/14/2023   SDH (subdural hematoma) (HCC) 09/14/2023   Fall 09/14/2023   Type 2 diabetes mellitus with peripheral neuropathy (HCC) 09/14/2023   Depression 09/14/2023   Focal seizure (HCC) 09/14/2023   History of total knee arthroplasty, right 04/30/2023   Acute CVA  (cerebrovascular accident) (HCC)    Right hemiparesis (HCC) 07/20/2021   Closed compression fracture of body of L1 vertebra (HCC) 04/28/2019   Family history of colon cancer 03/26/2018   Primary osteoarthritis of right knee 09/04/2017   Primary osteoarthritis of left knee 09/04/2017   Status post left partial knee replacement 09/04/2017   OSA on CPAP 01/24/2017   Yellow jacket sting 10/31/2016   HTN (hypertension) 10/31/2016   Type 2 diabetes mellitus with hyperlipidemia (HCC) 10/31/2016   HLD (hyperlipidemia) 10/31/2016   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   Benign essential HTN 10/15/2013    ONSET DATE: 04/30/23  REFERRING DIAG: R26.89 (ICD-10-CM) - Imbalance   THERAPY DIAG:  Difficulty in walking, not elsewhere classified  Abnormality of gait and mobility  Other abnormalities of gait and mobility  Unsteadiness on feet  Muscle weakness (generalized)  Rationale for Evaluation and Treatment: Rehabilitation  SUBJECTIVE:                                                                                                                                                                                             SUBJECTIVE STATEMENT:  Patient reports no falls since last session but still having difficulty with balance.  Felt himself veering earlier today to the left and had to be corrected by wife.    From Eval: Pt had knee replacement in the R side on April 30, 2023. Pt had first fall on 06/2023. Pt had surgery with Dr. Hooten. Pt reports he still has clicking in the right knee and has pain in the right knee that he reports as being worse since before the surgery. Pt reports he is going to see the doctor in a few months again regarding the right knee. Pt reports not walking as well as he used to and notices he moves to the left when he is ambulating. Pt also notes recent unintentional weight loss, has discussed it with MD per his report.   Pt accompanied by: significant  other  PERTINENT HISTORY: Pt familiar to clinic previously for post stroke rehab  in later half of 2023. Pt has hx of  OA, Diabetes mellitus type 2, controlled, with complications, GERD, Glaucoma   Hemorrhoids   Hyperlipidemia,Hypertension, OSA on CPAP   PAIN:  Are you having pain? Yes: NPRS scale: 7 Pain location: R knee Pain description: constant Aggravating factors: kneeling Relieving factors: n/a  PRECAUTIONS: Fall  RED FLAGS: None   WEIGHT BEARING RESTRICTIONS: No  FALLS: Has patient fallen in last 6 months? Yes. Number of falls 6-7  LIVING ENVIRONMENT: Lives with: lives with their family Lives in: House/apartment Stairs: Yes: External: 7 steps; none Has following equipment at home: Single point cane and Walker - 2 wheeled  PLOF: Independent with basic ADLs and has lessened his activity and uses his wife to assist with things  PATIENT GOALS: Improve balance, gait and mobility   OBJECTIVE:  Note: Objective measures were completed at Evaluation unless otherwise noted.  DIAGNOSTIC FINDINGS: n/a  COGNITION: Overall cognitive status: Within functional limits for tasks assessed   SENSATION: Not tested  LOWER EXTREMITY ROM:     Active  Right Eval Left Eval  Hip flexion    Hip extension    Hip abduction    Hip adduction    Hip internal rotation    Hip external rotation    Knee flexion    Knee extension 15 deg from full ext seated AROM    Ankle dorsiflexion    Ankle plantarflexion    Ankle inversion    Ankle eversion     (Blank rows = not tested)  LOWER EXTREMITY MMT:    MMT Right Eval Left Eval  Hip flexion 3+ 4  Hip extension    Hip abduction 4 4+  Hip adduction 4+ 4+  Hip internal rotation    Hip external rotation    Knee flexion 4 4+  Knee extension 4 4+  Ankle dorsiflexion 4+ 4+  Ankle plantarflexion    Ankle inversion    Ankle eversion    (Blank rows = not tested)  BED MOBILITY:  No reported issues with this  TRANSFERS: Sit to stand:  Modified independence and CGA  Assistive device utilized: hands needed to complete     Stand to sit: Modified independence  Assistive device utilized: constols descent with hands        STAIRS: Pt reports using step to pattern with at least one fall on steps in past 6 months.  GAIT: Findings: Gait Characteristics: R knee does get to full extension, decreased step length- Right, decreased step length- Left, decreased stride length, decreased hip/knee flexion- Right, and decreased hip/knee flexion- Left, Distance walked: 30 ft, Level of assistance: Modified independence, and Comments: with turning around pt shows visible instability and mutiple small steps to keep balance, reaches for objects when near them in counter/furniture surfing style walking   FUNCTIONAL TESTS:   Orthostatic assessment: seated 142/76 HR 79 standing 136/73 HR 83  5 times sit to stand: 35.22 sec with heavyUE A Timed up and go (TUG): 19.88 sec  6 minute walk test: : test visit 2  10 meter walk test: .81 m/s BERG test visit 2   PATIENT SURVEYS:  ABC scale: The Activities-Specific Balance Confidence (ABC) Scale 0% 10 20 30  40 50 60 70 80 90 100% No confidence<->completely confident  "How confident are you that you will not lose your balance or become unsteady when you . . .   Date tested 09/12/23 10/31/23  Walk around the house 70% 60  2. Walk up or down  stairs 60% 40  3. Bend over and pick up a slipper from in front of a closet floor 30% 30  4. Reach for a small can off a shelf at eye level 100% 90  5. Stand on tip toes and reach for something above your head 80% 80  6. Stand on a chair and reach for something 0% 10  7. Sweep the floor 40% 40  8. Walk outside the house to a car parked in the driveway 80% 90  9. Get into or out of a car 90% 100  10. Walk across a parking lot to the mall 80% 80  11. Walk up or down a ramp 80% 80  12. Walk in a crowded mall where people rapidly walk past you 70% 80  13. Are  bumped into by people as you walk through the mall 70% 60  14. Step onto or off of an escalator while you are holding onto the railing 60% 60  15. Step onto or off an escalator while holding onto parcels such that you cannot hold onto the railing 60% 0  16. Walk outside on icy sidewalks 10% 0  Total: #/16 61.9% 56.25%                                                                                                                                 TREATMENT DATE: 11/14/23   TherAct- To improve functional movements patterns for everyday tasks / TE- To improve strength, endurance, mobility, and function of specific targeted muscle groups or improve joint range of motion or improve muscle flexibility  The patient completed 8 minutes at level(s) 1-6 on the NuStep on rolling hills setting using both B LE  promote strength, endurance, and cardiorespiratory fitness. PT increased the resistance level and monitored the patient's response to the intervention throughout. The patient required min cueing for technique and supervision level of assistance.  STS from elevated plinth x 10 reps, cues for forward weight shift and proper arm movements for best mechanical advantage   LAQ with 4# AW x 12 reps ea   STS from elevated plinth x 10 reps, cues for forward weight shift and proper arm movements for best mechanical advantage   LAQ with 4# AW x 12 reps ea   STS from elevated plinth x 10 reps, cues for forward weight shift and proper arm movements for best mechanical advantage   LAQ with 4# AW x 12 reps ea   Gait with 4#AW x 325 ft; cues for   NMR: To facilitate reeducation of movement, balance, posture, coordination, and/or proprioception/kinesthetic sense.  Standing on airex with lat head turns x 2 min   Standing march on airex 2 x 20 reps - one ant LOB     PATIENT EDUCATION: Education details: Pt educated throughout session about proper posture and technique with exercises. Improved exercise  technique, movement at target joints, use of target muscles after min to  mod verbal, visual, tactile cues Person educated: Patient Education method: Explanation Education comprehension: verbalized understanding   HOME EXERCISE PROGRAM: Access Code: [email or web address] URL: [email or web address] Date: 09/25/2023 Prepared by: Lonni Gainer  Exercises - Marching Near Counter  - 1 x daily - 7 x weekly - 2 sets - 10 reps - Backward Walking with Counter Support  - 1 x daily - 7 x weekly - 2 sets   GOALS: Goals reviewed with patient? Yes  SHORT TERM GOALS: Target date: 10/10/2023  Patient will be independent in home exercise program to improve strength/mobility for better functional independence with ADLs. Baseline: No HEP currently  Goal status: INITIAL   LONG TERM GOALS: Target date: 12/05/2023  1.  Patient will complete five times sit to stand test in < 15 seconds indicating an increased LE strength and improved balance. Baseline: 35.22 sec  with heavy UE assist Goal status: INITIAL  2.  Patient will improve ABC score to 71.5%   to demonstrate statistically significant improvement in mobility and quality of life as it relates to their balance and mobility.  Baseline: 61.9% Goal status: INITIAL   3.  Patient will increase Berg Balance score by > 6 points to demonstrate decreased fall risk during functional activities. Baseline: test visit 2  Goal status: INITIAL   4.  Patient will reduce timed up and go to <11 seconds to reduce fall risk and demonstrate improved transfer/gait ability. Baseline: 19.88 sec Goal status: INITIAL  5.  Patient will increase 10 meter walk test to >1.69m/s as to improve gait speed for better community ambulation and to reduce fall risk. Baseline: .73m/s Goal status: INITIAL  6.  Patient will increase six minute walk test distance by greater than 150 ft for progression to community ambulator and improve gait ability Baseline: 511 ft Goal  status: INITIAL    ASSESSMENT:  CLINICAL IMPRESSION:  Patient presents with good motivation for completion of physical therapy interventions. Pt progresses with functional strength and balance training as well as postural strengthening training this date. Pt also showing some veering when walking, was encouraged to look forward when ambulating to improve his awareness of his surrounding and potentially fix this problem. Pt will continue to benefit from skilled physical therapy intervention to address impairments, improve QOL, and attain therapy goals.     OBJECTIVE IMPAIRMENTS: Abnormal gait, decreased activity tolerance, decreased balance, decreased endurance, decreased mobility, difficulty walking, decreased strength, and pain.   ACTIVITY LIMITATIONS: standing, squatting, stairs, transfers, and locomotion level  PARTICIPATION LIMITATIONS: shopping, community activity, and yard work  PERSONAL FACTORS: Age, Behavior pattern, Time since onset of injury/illness/exacerbation, and 3+ comorbidities: DM, HTN, OA, HLD, OSA, R knee pain are also affecting patient's functional outcome.   REHAB POTENTIAL: Good  CLINICAL DECISION MAKING: Evolving/moderate complexity  EVALUATION COMPLEXITY: Moderate  PLAN:  PT FREQUENCY: 2x/week  PT DURATION: 12 weeks  PLANNED INTERVENTIONS: [id number]- Physical Performance Testing, 97110-Therapeutic exercises, 97530- Therapeutic activity, [id number]- Neuromuscular re-education, 97535- Self Care, [id number]- Manual therapy, [phone]- Gait training, Balance training, Stair training, Joint mobilization, Cryotherapy, and Moist heat   PLAN FOR NEXT SESSION:   Introduce more ankle exercises. Continues with balance exercises Monitor symptoms with more strenuous exercises as this seems to be the reasoning behind his symptoms Endurance training and walking across uneven surfaces   Note: Portions of this document were prepared using Dragon voice recognition software and although  reviewed may contain unintentional dictation errors in syntax, grammar, or spelling.  Lonni KATHEE Gainer PT ,DPT Physical Therapist- [facility]  [facility]   11/14/23, 8:08 AM

## 2023-11-14 ENCOUNTER — Encounter: Payer: Self-pay | Admitting: Dermatology

## 2023-11-15 ENCOUNTER — Ambulatory Visit: Admitting: Physical Therapy

## 2023-11-19 ENCOUNTER — Ambulatory Visit: Admitting: Physical Therapy

## 2023-11-19 DIAGNOSIS — R269 Unspecified abnormalities of gait and mobility: Secondary | ICD-10-CM

## 2023-11-19 DIAGNOSIS — R2681 Unsteadiness on feet: Secondary | ICD-10-CM

## 2023-11-19 DIAGNOSIS — M6281 Muscle weakness (generalized): Secondary | ICD-10-CM

## 2023-11-19 DIAGNOSIS — R2689 Other abnormalities of gait and mobility: Secondary | ICD-10-CM

## 2023-11-19 DIAGNOSIS — R262 Difficulty in walking, not elsewhere classified: Secondary | ICD-10-CM

## 2023-11-19 NOTE — Therapy (Signed)
 OUTPATIENT PHYSICAL THERAPY NEURO TREATMENT    Patient Name: Vernon Payne. MRN: [id number] DOB:[birth year], 73 y.o., male Today's Date: 11/19/2023   PCP: Cleotilde Oneil FALCON, MD  REFERRING PROVIDER: Maree Jannett POUR, MD  END OF SESSION:  PT End of Session - 11/19/23 1108     Visit Number 14    Number of Visits 24    Date for Recertification  12/05/23    Progress Note Due on Visit 20    PT Start Time 1103    PT Stop Time 1141    PT Time Calculation (min) 38 min    Equipment Utilized During Treatment Gait belt    Activity Tolerance Patient tolerated treatment well    Behavior During Therapy WFL for tasks assessed/performed            Past Medical History:  Diagnosis Date   Actinic keratosis    Arthritis    Diabetes mellitus without complication (HCC)    GERD (gastroesophageal reflux disease)    Hard of hearing    left hearing aid   HLD (hyperlipidemia)    HTN (hypertension)    Hypotension, postural    IBS (irritable bowel syndrome)    Primary osteoarthritis of right knee    Sleep apnea    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 Tx pending   Squamous cell carcinoma of skin 01/20/2010   Right chest. Well differentiated. Excised: 01/27/2010   Squamous cell carcinoma of skin 07/15/2019   R preauricular ant to lobe   Squamous cell carcinoma of skin 10/18/2023   Left posterior helix. KA type. Mohs pending.   Stroke [facility])    May 31/ 2023   Wears dentures    partial upper, implants on bottom   Past Surgical History:  Procedure Laterality Date   APPENDECTOMY     BACK SURGERY     lumbar   CATARACT EXTRACTION W/PHACO Right 08/29/2017   Procedure: CATARACT EXTRACTION PHACO AND INTRAOCULAR LENS PLACEMENT (IOC) RIGHT DIABETIC;  Surgeon: Mittie Gaskin, MD;  Location: [facility];  Service: Ophthalmology;  Laterality: Right;  ISTENT INJECT   CATARACT EXTRACTION W/PHACO Left 11/21/2017   Procedure: CATARACT EXTRACTION  PHACO AND INTRAOCULAR LENS PLACEMENT (IOC);  Surgeon: Mittie Gaskin, MD;  Location: [facility];  Service: Ophthalmology;  Laterality: Left;   COLONOSCOPY WITH PROPOFOL  N/A 01/26/2023   Procedure: COLONOSCOPY WITH PROPOFOL ;  Surgeon: Onita Elspeth Sharper, DO;  Location: [facility] ENDOSCOPY;  Service: Gastroenterology;  Laterality: N/A;   EYE SURGERY Left 11/21/2017   cataract excision   INSERTION OF ANTERIOR SEGMENT AQUEOUS DRAINAGE DEVICE (ISTENT) Right 08/29/2017   Procedure: (ISTENT);  Surgeon: Mittie Gaskin, MD;  Location: [facility];  Service: Ophthalmology;  Laterality: Right;  Diabetic - oral meds   INSERTION OF ANTERIOR SEGMENT AQUEOUS DRAINAGE DEVICE (ISTENT) Left 11/21/2017   Procedure: INSERTION OF ANTERIOR SEGMENT AQUEOUS DRAINAGE DEVICE (ISTENT)  INJECT DIABETES ISTENT INJECT;  Surgeon: Mittie Gaskin, MD;  Location: [facility];  Service: Ophthalmology;  Laterality: Left;  Diabetic - oral meds   JOINT REPLACEMENT     left knee replacement   KNEE ARTHROPLASTY Right 04/30/2023   Procedure: ARTHROPLASTY, KNEE, TOTAL, USING IMAGELESS COMPUTER-ASSISTED NAVIGATION;  Surgeon: Mardee Lynwood SQUIBB, MD;  Location: [facility] ORS;  Service: Orthopedics;  Laterality: Right;   KYPHOPLASTY N/A 03/28/2019   Procedure: L1 KYPHOPLASTY;  Surgeon: Kathlynn Sharper, MD;  Location: [facility] ORS;  Service: Orthopedics;  Laterality: N/A;   LASIK  POLYPECTOMY  01/26/2023   Procedure: POLYPECTOMY;  Surgeon: Onita Elspeth Sharper, DO;  Location: [facility];  Service: Gastroenterology;;   ROTATOR CUFF REPAIR     TONSILLECTOMY     Patient Active Problem List   Diagnosis Date Noted   SAH (subarachnoid hemorrhage) (HCC) 09/14/2023   SDH (subdural hematoma) (HCC) 09/14/2023   Fall 09/14/2023   Type 2 diabetes mellitus with peripheral neuropathy (HCC) 09/14/2023   Depression 09/14/2023   Focal seizure (HCC) 09/14/2023   History of total knee arthroplasty, right 04/30/2023   Acute  CVA (cerebrovascular accident) (HCC)    Right hemiparesis (HCC) 07/20/2021   Closed compression fracture of body of L1 vertebra (HCC) 04/28/2019   Family history of colon cancer 03/26/2018   Primary osteoarthritis of right knee 09/04/2017   Primary osteoarthritis of left knee 09/04/2017   Status post left partial knee replacement 09/04/2017   OSA on CPAP 01/24/2017   Yellow jacket sting 10/31/2016   HTN (hypertension) 10/31/2016   Type 2 diabetes mellitus with hyperlipidemia (HCC) 10/31/2016   HLD (hyperlipidemia) 10/31/2016   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   Benign essential HTN 10/15/2013    ONSET DATE: 04/30/23  REFERRING DIAG: R26.89 (ICD-10-CM) - Imbalance   THERAPY DIAG:  Difficulty in walking, not elsewhere classified  Abnormality of gait and mobility  Other abnormalities of gait and mobility  Unsteadiness on feet  Muscle weakness (generalized)  Rationale for Evaluation and Treatment: Rehabilitation  SUBJECTIVE:                                                                                                                                                                                             SUBJECTIVE STATEMENT:  Patient reports no changes since last session.    From Eval: Pt had knee replacement in the R side on April 30, 2023. Pt had first fall on 06/2023. Pt had surgery with Dr. Hooten. Pt reports he still has clicking in the right knee and has pain in the right knee that he reports as being worse since before the surgery. Pt reports he is going to see the doctor in a few months again regarding the right knee. Pt reports not walking as well as he used to and notices he moves to the left when he is ambulating. Pt also notes recent unintentional weight loss, has discussed it with MD per his report.   Pt accompanied by: significant other  PERTINENT HISTORY: Pt familiar to clinic previously for post stroke rehab in later half of 2023. Pt has hx of  OA,  Diabetes mellitus type 2, controlled, with complications, GERD, Glaucoma  Hemorrhoids   Hyperlipidemia,Hypertension, OSA on CPAP   PAIN:  Are you having pain? Yes: NPRS scale: 7 Pain location: R knee Pain description: constant Aggravating factors: kneeling Relieving factors: n/a  PRECAUTIONS: Fall  RED FLAGS: None   WEIGHT BEARING RESTRICTIONS: No  FALLS: Has patient fallen in last 6 months? Yes. Number of falls 6-7  LIVING ENVIRONMENT: Lives with: lives with their family Lives in: House/apartment Stairs: Yes: External: 7 steps; none Has following equipment at home: Single point cane and Walker - 2 wheeled  PLOF: Independent with basic ADLs and has lessened his activity and uses his wife to assist with things  PATIENT GOALS: Improve balance, gait and mobility   OBJECTIVE:  Note: Objective measures were completed at Evaluation unless otherwise noted.  DIAGNOSTIC FINDINGS: n/a  COGNITION: Overall cognitive status: Within functional limits for tasks assessed   SENSATION: Not tested  LOWER EXTREMITY ROM:     Active  Right Eval Left Eval  Hip flexion    Hip extension    Hip abduction    Hip adduction    Hip internal rotation    Hip external rotation    Knee flexion    Knee extension 15 deg from full ext seated AROM    Ankle dorsiflexion    Ankle plantarflexion    Ankle inversion    Ankle eversion     (Blank rows = not tested)  LOWER EXTREMITY MMT:    MMT Right Eval Left Eval  Hip flexion 3+ 4  Hip extension    Hip abduction 4 4+  Hip adduction 4+ 4+  Hip internal rotation    Hip external rotation    Knee flexion 4 4+  Knee extension 4 4+  Ankle dorsiflexion 4+ 4+  Ankle plantarflexion    Ankle inversion    Ankle eversion    (Blank rows = not tested)  BED MOBILITY:  No reported issues with this  TRANSFERS: Sit to stand: Modified independence and CGA  Assistive device utilized: hands needed to complete     Stand to sit: Modified independence   Assistive device utilized: constols descent with hands        STAIRS: Pt reports using step to pattern with at least one fall on steps in past 6 months.  GAIT: Findings: Gait Characteristics: R knee does get to full extension, decreased step length- Right, decreased step length- Left, decreased stride length, decreased hip/knee flexion- Right, and decreased hip/knee flexion- Left, Distance walked: 30 ft, Level of assistance: Modified independence, and Comments: with turning around pt shows visible instability and mutiple small steps to keep balance, reaches for objects when near them in counter/furniture surfing style walking   FUNCTIONAL TESTS:   Orthostatic assessment: seated 142/76 HR 79 standing 136/73 HR 83  5 times sit to stand: 35.22 sec with heavyUE A Timed up and go (TUG): 19.88 sec  6 minute walk test: : test visit 2  10 meter walk test: .81 m/s BERG test visit 2   PATIENT SURVEYS:  ABC scale: The Activities-Specific Balance Confidence (ABC) Scale 0% 10 20 30  40 50 60 70 80 90 100% No confidence<->completely confident  "How confident are you that you will not lose your balance or become unsteady when you . . .   Date tested 09/12/23 10/31/23  Walk around the house 70% 60  2. Walk up or down stairs 60% 40  3. Bend over and pick up a slipper from in front of a closet floor 30% 30  4. Reach for a small can off a shelf at eye level 100% 90  5. Stand on tip toes and reach for something above your head 80% 80  6. Stand on a chair and reach for something 0% 10  7. Sweep the floor 40% 40  8. Walk outside the house to a car parked in the driveway 80% 90  9. Get into or out of a car 90% 100  10. Walk across a parking lot to the mall 80% 80  11. Walk up or down a ramp 80% 80  12. Walk in a crowded mall where people rapidly walk past you 70% 80  13. Are bumped into by people as you walk through the mall 70% 60  14. Step onto or off of an escalator while you are holding onto the  railing 60% 60  15. Step onto or off an escalator while holding onto parcels such that you cannot hold onto the railing 60% 0  16. Walk outside on icy sidewalks 10% 0  Total: #/16 61.9% 56.25%                                                                                                                                 TREATMENT DATE: 11/19/23   TherAct- To improve functional movements patterns for everyday tasks / TE- To improve strength, endurance, mobility, and function of specific targeted muscle groups or improve joint range of motion or improve muscle flexibility  The patient completed 8 minutes at level(s) 1-6 on the NuStep on rolling hills setting using both B LE  promote strength, endurance, and cardiorespiratory fitness. PT increased the resistance level and monitored the patient's response to the intervention throughout. The patient required min cueing for technique and supervision level of assistance.  STS from elevated plinth x 10 reps, cues for forward weight shift and proper arm movements for best mechanical advantage   Sidestepping x 25 ft ea direction x 2 rounds   STS from elevated plinth x 10 reps, cues for forward weight shift and proper arm movements for best mechanical advantage   Gait with 3#AW x 300 ft   LAQ with 3# AW 2 x 10 reps ea   Gait with 3#AW x 300 ft   STS from elevated plinth x 10 reps, cues for forward weight shift and proper arm movements for best mechanical advantage   Gait with 3#AW x 325 ft   Seated march x 10 with 3# AW    PATIENT EDUCATION: Education details: Pt educated throughout session about proper posture and technique with exercises. Improved exercise technique, movement at target joints, use of target muscles after min to mod verbal, visual, tactile cues Person educated: Patient Education method: Explanation Education comprehension: verbalized understanding   HOME EXERCISE PROGRAM: Access Code: [email or web address] URL:  [email or web address] Date: 09/25/2023 Prepared by: Lonni Gainer  Exercises - Marching Near Counter  - 1 x daily - 7 x weekly -  2 sets - 10 reps - Backward Walking with Counter Support  - 1 x daily - 7 x weekly - 2 sets   GOALS: Goals reviewed with patient? Yes  SHORT TERM GOALS: Target date: 10/10/2023  Patient will be independent in home exercise program to improve strength/mobility for better functional independence with ADLs. Baseline: No HEP currently  Goal status: INITIAL   LONG TERM GOALS: Target date: 12/05/2023  1.  Patient will complete five times sit to stand test in < 15 seconds indicating an increased LE strength and improved balance. Baseline: 35.22 sec  with heavy UE assist Goal status: INITIAL  2.  Patient will improve ABC score to 71.5%   to demonstrate statistically significant improvement in mobility and quality of life as it relates to their balance and mobility.  Baseline: 61.9% Goal status: INITIAL   3.  Patient will increase Berg Balance score by > 6 points to demonstrate decreased fall risk during functional activities. Baseline: test visit 2  Goal status: INITIAL   4.  Patient will reduce timed up and go to <11 seconds to reduce fall risk and demonstrate improved transfer/gait ability. Baseline: 19.88 sec Goal status: INITIAL  5.  Patient will increase 10 meter walk test to >1.61m/s as to improve gait speed for better community ambulation and to reduce fall risk. Baseline: .43m/s Goal status: INITIAL  6.  Patient will increase six minute walk test distance by greater than 150 ft for progression to community ambulator and improve gait ability Baseline: 511 ft Goal status: INITIAL    ASSESSMENT:  CLINICAL IMPRESSION:  Patient presents with good motivation for completion of physical therapy interventions. Pt progresses with functional strength and balance training as well as postural strengthening training this date. Focussed on  large LE muscle groups and functional movement training this date. Pt will continue to benefit from skilled physical therapy intervention to address impairments, improve QOL, and attain therapy goals.     OBJECTIVE IMPAIRMENTS: Abnormal gait, decreased activity tolerance, decreased balance, decreased endurance, decreased mobility, difficulty walking, decreased strength, and pain.   ACTIVITY LIMITATIONS: standing, squatting, stairs, transfers, and locomotion level  PARTICIPATION LIMITATIONS: shopping, community activity, and yard work  PERSONAL FACTORS: Age, Behavior pattern, Time since onset of injury/illness/exacerbation, and 3+ comorbidities: DM, HTN, OA, HLD, OSA, R knee pain are also affecting patient's functional outcome.   REHAB POTENTIAL: Good  CLINICAL DECISION MAKING: Evolving/moderate complexity  EVALUATION COMPLEXITY: Moderate  PLAN:  PT FREQUENCY: 2x/week  PT DURATION: 12 weeks  PLANNED INTERVENTIONS: 97750- Physical Performance Testing, 97110-Therapeutic exercises, 97530- Therapeutic activity, [id number]- Neuromuscular re-education, 97535- Self Care, [id number]- Manual therapy, [phone]- Gait training, Balance training, Stair training, Joint mobilization, Cryotherapy, and Moist heat   PLAN FOR NEXT SESSION:   Introduce more ankle exercises. Continues with balance exercises Monitor symptoms with more strenuous exercises as this seems to be the reasoning behind his symptoms Endurance training and walking across uneven surfaces   Note: Portions of this document were prepared using Dragon voice recognition software and although reviewed may contain unintentional dictation errors in syntax, grammar, or spelling.  Lonni KATHEE Gainer PT ,DPT Physical Therapist- [facility]   11/19/23, 11:09 AM

## 2023-11-20 ENCOUNTER — Ambulatory Visit: Admitting: Dermatology

## 2023-11-20 ENCOUNTER — Encounter: Payer: Self-pay | Admitting: Dermatology

## 2023-11-20 ENCOUNTER — Ambulatory Visit

## 2023-11-20 VITALS — BP 158/91 | HR 88 | Temp 98.8°F

## 2023-11-20 DIAGNOSIS — C4492 Squamous cell carcinoma of skin, unspecified: Secondary | ICD-10-CM

## 2023-11-20 DIAGNOSIS — L814 Other melanin hyperpigmentation: Secondary | ICD-10-CM | POA: Diagnosis not present

## 2023-11-20 DIAGNOSIS — L578 Other skin changes due to chronic exposure to nonionizing radiation: Secondary | ICD-10-CM | POA: Diagnosis not present

## 2023-11-20 DIAGNOSIS — C44229 Squamous cell carcinoma of skin of left ear and external auricular canal: Secondary | ICD-10-CM

## 2023-11-20 MED ORDER — GENTAMICIN SULFATE 0.1 % EX OINT: 1.0000 | TOPICAL_OINTMENT | Freq: Three times a day (TID) | CUTANEOUS | 2 refills | Status: AC

## 2023-11-20 MED ORDER — MUPIROCIN 2 % EX OINT: 1.0000 | TOPICAL_OINTMENT | Freq: Two times a day (BID) | CUTANEOUS | 2 refills | Status: AC

## 2023-11-20 NOTE — Progress Notes (Signed)
 Follow-Up Visit   Subjective  Vernon Want. is a 73 y.o. 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 Claudene. Accompanied by his wife.   The following portions of the chart were reviewed this encounter and updated as appropriate: medications, allergies, medical history  Review of Systems:  No other skin or systemic complaints except as noted in HPI or Assessment and Plan.  Objective  Well appearing patient in no apparent distress; mood and affect are within normal limits.  A focused examination was performed of the following areas: Left posterior helix Relevant physical exam findings are noted in the Assessment and Plan.   left posterior helix Atrophic biopsy scar   Assessment & Plan   SQUAMOUS CELL CARCINOMA OF SKIN left posterior helix Mohs surgery  Consent obtained: written  Anticoagulation: Is the patient taking prescription anticoagulant and/or aspirin  prescribed/recommended by a physician? Yes   Was the anticoagulation regimen changed prior to Mohs? No    Anesthesia: Anesthesia method: local infiltration Local anesthetic: lidocaine  1% WITH epi  Procedure Details: Timeout: pre-procedure verification complete Procedure Prep: patient was prepped and draped in usual sterile fashion Prep type: chlorhexidine  Biopsy accession number: [id number] Frozen section biopsy performed: No   Specimen debulked: No   Pre-Op diagnosis: squamous cell carcinoma SCC subtype: KA type MohsAIQ Surgical site (if tumor spans multiple areas, please select predominant area): ear Surgery side: left Surgical site (from skin exam): left posterior helix Pre-operative length (cm): 1 Pre-operative width (cm): 0.6 Indications for Mohs surgery: anatomic location where tissue conservation is critical Previously treated? No    Micrographic Surgery Details: Post-operative length (cm):  2 Post-operative width (cm): 1.3 Number of Mohs stages: 2 Cumulative additional sections past 5 per stage: 0 Post surgery depth of defect: subcutaneous fat Is this a complex case (associate members only): No    Stage 1    Tumor features identified on Mohs section: squamous cell carcinoma    Depth of tumor invasion after stage: dermis and subcutaneous fat    Perineural invasion: no perineural invasion  Stage 2    Tumor features identified on Mohs section: no tumor identified    Depth of tumor invasion after stage: subcutaneous fat    Perineural invasion: no perineural invasion  Patient tolerance of procedure: tolerated well, no immediate complications  Reconstruction: Was the defect reconstructed?: No    Opioids: Did the patient receive a prescription for opioid/narcotic related to Mohs surgery?: No    Antibiotics: Does patient meet AHA guidelines for endocarditis?: No   Does patient meet AHA guidelines for orthopedic prophylaxis?: No   Were antibiotics given on the day of surgery? Yes   When were antibiotics given? post-operative Did surgery breach mucosa, expose cartilage/bone, involve an area of lymphedema/inflamed/infected tissue? No   Indication for post-operative antibiotics: anatomic location    Return in about 4 weeks (around 12/18/2023) for Mohs f/u.  LILLETTE Berwyn Lesches, Surg Tech III, am acting as scribe for RUFUS CHRISTELLA HOLY, MD.    11/20/2023  HISTORY OF PRESENT ILLNESS  Vernon Payne. is seen in consultation at the request of Dr. 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 They note that the area has been present for about 6 months increasing in size with time.  There is no history of previous treatment.  Reports no other new or changing lesions and has no other complaints today.  Medications and allergies: see patient chart.  Review of systems: Reviewed 8 systems and notable for the above skin cancer.  All  other systems reviewed are unremarkable/negative, unless noted in the HPI. Past medical history, surgical history, family history, social history were also reviewed and are noted in the chart/questionnaire.    PHYSICAL EXAMINATION  General: Well-appearing, in no acute distress, alert and oriented x 4. Vitals reviewed in chart (if available).   Skin: Exam reveals a 1.0 x 0.6 cm erythematous papule and biopsy scar on the left posterior helix. There are rhytids, telangiectasias, and lentigines, consistent with photodamage.  Biopsy report(s) reviewed, confirming the diagnosis.   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 Due to location, size, histology, or recurrence and the likelihood of subclinical extension as well as the need to conserve normal surrounding tissue, the patient was deemed acceptable for Mohs micrographic surgery (MMS).  The nature and purpose of the procedure, associated benefits and risks including recurrence and scarring, possible complications such as pain, infection, and bleeding, and alternative methods of treatment if appropriate were discussed with the patient during consent. The lesion location was verified by the patient, by reviewing previous notes, pathology reports, and by photographs as well as angulation measurements if available.  Informed consent was reviewed and signed by the patient, and timeout was performed at 8:30 AM. See op note below.  2. For the photodamage and solar lentigines, sun protection discussed/information given on OTC sunscreens, and we recommend continued regular follow-up with primary dermatologist every 6 months or sooner for any growing, bleeding, or changing lesions. 3. Prognosis and future surveillance discussed. 4. Letter with treatment outcome sent to referring provider. 5. Pain acetaminophen /ibuprofen  MOHS MICROGRAPHIC SURGERY AND  RECONSTRUCTION  Initial size:   1.0 x 0.6 cm Surgical defect/wound size: 2.0 x 1.3 cm Anesthesia:    0.33% lidocaine  with 1:200,000 epinephrine  EBL:    <5 mL Complications:  None Repair type:   Second Intention  Stages: 2  STAGE I: Anesthesia achieved with 0.5% lidocaine  with 1:200,000 epinephrine . ChloraPrep applied. 1 section(s) excised using Mohs technique (this includes total peripheral and deep tissue margin excision and evaluation with frozen sections, excised and interpreted by the same physician). The tumor was first debulked and then excised with an approx. 2 mm margin.  Hemostasis was achieved with electrocautery as needed.  The specimen was then oriented, subdivided/relaxed, inked, and processed using Mohs technique.    Frozen section analysis revealed a positive margin for atypical epithelial cells with squamous differentiation in the dermis in the peripheral margin.    STAGE II: An additional 2 mm margin was excised.  Hemostasis was achieved with electrocautery as needed.  The specimen was then oriented, subdivided/relaxed, inked, and processed using Mohs technique. Evaluation of slides by the Mohs surgeon revealed clear tumor margins.  Reconstruction  Patient was notified of results and repair options were discussed, including second intention healing. After reviewing the advantages and disadvantages of each, we agreed on second intention healing as appropriate.   The surgical site was then lightly scrubbed with sterile, saline-soaked gauze.  The area was bandaged using Vaseline ointment, non-adherent gauze, gauze pads, and tape to provide an adequate pressure dressing.   The patient tolerated the procedure well, was given detailed written and verbal wound care instructions, and was discharged in good condition.  The patient will follow-up in 4 weeks and as scheduled with  primary dermatologist.    Documentation: I have reviewed the above documentation for accuracy and  completeness, and I agree with the above.  RUFUS CHRISTELLA HOLY, MD

## 2023-11-20 NOTE — Patient Instructions (Signed)

## 2023-11-22 ENCOUNTER — Other Ambulatory Visit: Payer: Self-pay

## 2023-11-22 ENCOUNTER — Encounter: Payer: Self-pay | Admitting: Dermatology

## 2023-11-22 ENCOUNTER — Ambulatory Visit: Admitting: Physical Therapy

## 2023-11-22 ENCOUNTER — Emergency Department
Admission: EM | Admit: 2023-11-22 | Discharge: 2023-11-22 | Disposition: A | Discharge status: Home / Self Care | Attending: Emergency Medicine | Admitting: Emergency Medicine

## 2023-11-22 ENCOUNTER — Emergency Department

## 2023-11-22 DIAGNOSIS — T07XXXA Unspecified multiple injuries, initial encounter: Secondary | ICD-10-CM

## 2023-11-22 DIAGNOSIS — S80212A Abrasion, left knee, initial encounter: Secondary | ICD-10-CM | POA: Diagnosis not present

## 2023-11-22 DIAGNOSIS — S0081XA Abrasion of other part of head, initial encounter: Secondary | ICD-10-CM | POA: Diagnosis not present

## 2023-11-22 DIAGNOSIS — S0990XA Unspecified injury of head, initial encounter: Secondary | ICD-10-CM

## 2023-11-22 DIAGNOSIS — W108XXA Fall (on) (from) other stairs and steps, initial encounter: Secondary | ICD-10-CM | POA: Diagnosis not present

## 2023-11-22 DIAGNOSIS — W19XXXA Unspecified fall, initial encounter: Secondary | ICD-10-CM

## 2023-11-22 NOTE — ED Notes (Addendum)
 Pt's open wounds were dressed with bandages before discharge.

## 2023-11-22 NOTE — ED Provider Notes (Signed)
 [facility] Provider Note    Event Date/Time   First MD Initiated Contact with Patient 11/22/23 1642     (approximate)   History   Fall   HPI  Vernon Payne. is a 73 y.o. male who presents after a fall.  Patient reports he tripped and fell forward onto gravel.  He did hit his head.  He is on Plavix .  Does have a history of intracranial hemorrhage in the past.     Physical Exam   Triage Vital Signs: ED Triage Vitals  Encounter Vitals Group     BP 11/22/23 1620 (!) 147/94     Girls Systolic BP Percentile --      Girls Diastolic BP Percentile --      Boys Systolic BP Percentile --      Boys Diastolic BP Percentile --      Pulse Rate 11/22/23 1620 80     Resp 11/22/23 1620 20     Temp 11/22/23 1620 97.9 F (36.6 C)     Temp Source 11/22/23 1620 Oral     SpO2 11/22/23 1620 100 %     Weight 11/22/23 1618 92.1 kg (203 lb)     Height 11/22/23 1618 1.88 m (6' 2)     Head Circumference --      Peak Flow --      Pain Score 11/22/23 1618 2     Pain Loc --      Pain Education --      Exclude from Growth Chart --     Most recent vital signs: Vitals:   11/22/23 1620  BP: (!) 147/94  Pulse: 80  Resp: 20  Temp: 97.9 F (36.6 C)  SpO2: 100%     General: Awake, abrasion to the forehead, swelling over the right eye CV:  Good peripheral perfusion.  Resp:  Normal effort.  Abd:  No distention.  Soft, nontender Other:  No pain with axial load on both hips.  Good range of motion of the upper extremities.  No chest wall pain. Multiple shallow abrasions noted   ED Results / Procedures / Treatments   Labs (all labs ordered are listed, but only abnormal results are displayed) Labs Reviewed - No data to display   EKG     RADIOLOGY CT head viewed interpreted by me, no ICH, confirmed by radiology    PROCEDURES:  Critical Care performed:   Procedures   MEDICATIONS ORDERED IN ED: Medications - No data to display   IMPRESSION /  MDM / ASSESSMENT AND PLAN / ED COURSE  I reviewed the triage vital signs and the nursing notes. Patient's presentation is most consistent with acute presentation with potential threat to life or bodily function.  Patient with a history of intracranial hemorrhage presents after a fall.  He did have head injury.  Pending CT head and cervical spine.  Overall reassuring exam, no evidence of bony injuries, most consistent with mild contusion/abrasions.  CT scan is negative for acute abnormality, appropriate for discharge, patient declined wound care        FINAL CLINICAL IMPRESSION(S) / ED DIAGNOSES   Final diagnoses:  Fall, initial encounter  Injury of head, initial encounter  Abrasions of multiple sites     Rx / DC Orders   ED Discharge Orders     None        Note:  This document was prepared using Dragon voice recognition software and may include unintentional dictation errors.  Arlander Charleston, MD 11/22/23 JEROLYN

## 2023-11-22 NOTE — ED Triage Notes (Signed)
 Patient tripped and fell down 3 steps, has abrasions on forehead and left knee; on Plavix .

## 2023-11-27 ENCOUNTER — Encounter: Payer: Self-pay | Admitting: Dermatology

## 2023-11-27 ENCOUNTER — Ambulatory Visit: Attending: Neurology

## 2023-11-27 ENCOUNTER — Ambulatory Visit: Admitting: Dermatology

## 2023-11-27 DIAGNOSIS — M6281 Muscle weakness (generalized): Secondary | ICD-10-CM | POA: Diagnosis present

## 2023-11-27 DIAGNOSIS — W908XXA Exposure to other nonionizing radiation, initial encounter: Secondary | ICD-10-CM

## 2023-11-27 DIAGNOSIS — R269 Unspecified abnormalities of gait and mobility: Secondary | ICD-10-CM | POA: Diagnosis present

## 2023-11-27 DIAGNOSIS — L570 Actinic keratosis: Secondary | ICD-10-CM

## 2023-11-27 DIAGNOSIS — D044 Carcinoma in situ of skin of scalp and neck: Secondary | ICD-10-CM

## 2023-11-27 DIAGNOSIS — I639 Cerebral infarction, unspecified: Secondary | ICD-10-CM | POA: Insufficient documentation

## 2023-11-27 DIAGNOSIS — R262 Difficulty in walking, not elsewhere classified: Secondary | ICD-10-CM | POA: Diagnosis present

## 2023-11-27 DIAGNOSIS — R2689 Other abnormalities of gait and mobility: Secondary | ICD-10-CM | POA: Diagnosis present

## 2023-11-27 DIAGNOSIS — R2681 Unsteadiness on feet: Secondary | ICD-10-CM | POA: Insufficient documentation

## 2023-11-27 DIAGNOSIS — D099 Carcinoma in situ, unspecified: Secondary | ICD-10-CM

## 2023-11-27 NOTE — Patient Instructions (Addendum)
 Electrodesiccation and Curettage ("Scrape and Burn") Wound Care Instructions  Leave the original bandage on for 24 hours if possible.  If the bandage becomes soaked or soiled before that time, it is OK to remove it and examine the wound.  A small amount of post-operative bleeding is normal.  If excessive bleeding occurs, remove the bandage, place gauze over the site and apply continuous pressure (no peeking) over the area for 30 minutes. If this does not work, please call our clinic as soon as possible or page your doctor if it is after hours.   Once a day, cleanse the wound with soap and water. It is fine to shower. If a thick crust develops you may use a Q-tip dipped into dilute hydrogen peroxide (mix 1:1 with water) to dissolve it.  Hydrogen peroxide can slow the healing process, so use it only as needed.    After washing, apply petroleum jelly (Vaseline) or an antibiotic ointment if your doctor prescribed one for you, followed by a bandage.    For best healing, the wound should be covered with a layer of ointment at all times. If you are not able to keep the area covered with a bandage to hold the ointment in place, this may mean re-applying the ointment several times a day.  Continue this wound care until the wound has healed and is no longer open. It may take several weeks for the wound to heal and close.  Itching and mild discomfort is normal during the healing process.  If you have any discomfort, you can take Tylenol  (acetaminophen ) or ibuprofen as directed on the bottle. (Please do not take these if you have an allergy to them or cannot take them for another reason).  Some redness, tenderness and white or yellow material in the wound is normal healing.  If the area becomes very sore and red, or develops a thick yellow-green material (pus), it may be infected; please notify us .    Wound healing continues for up to one year following surgery. It is not unusual to experience pain in the scar  from time to time during the interval.  If the pain becomes severe or the scar thickens, you should notify the office.    A slight amount of redness in a scar is expected for the first six months.  After six months, the redness will fade and the scar will soften and fade.  The color difference becomes less noticeable with time.  If there are any problems, return for a post-op surgery check at your earliest convenience.  To improve the appearance of the scar, you can use silicone scar gel, cream, or sheets (such as Mederma or Serica) every night for up to one year. These are available over the counter (without a prescription).  Please call our office at [phone] for any questions or concerns.  Due to recent changes in healthcare laws, you may see results of your pathology and/or laboratory studies on MyChart before the doctors have had a chance to review them. We understand that in some cases there may be results that are confusing or concerning to you. Please understand that not all results are received at the same time and often the doctors may need to interpret multiple results in order to provide you with the best plan of care or course of treatment. Therefore, we ask that you please give us  2 business days to thoroughly review all your results before contacting the office for clarification. Should we see a  critical lab result, you will be contacted sooner.   If You Need Anything After Your Visit  If you have any questions or concerns for your doctor, please call our main line at [phone] and press option 4 to reach your doctor's medical assistant. If no one answers, please leave a voicemail as directed and we will return your call as soon as possible. Messages left after 4 pm will be answered the following business day.   You may also send us  a message via MyChart. We typically respond to MyChart messages within 1-2 business days.  For prescription refills, please ask your pharmacy to  contact our office. Our fax number is [phone].  If you have an urgent issue when the clinic is closed that cannot wait until the next business day, you can page your doctor at the number below.    Please note that while we do our best to be available for urgent issues outside of office hours, we are not available 24/7.   If you have an urgent issue and are unable to reach us , you may choose to seek medical care at your doctor's office, retail clinic, urgent care center, or emergency room.  If you have a medical emergency, please immediately call 911 or go to the emergency department.  Pager Numbers  - Dr. Hester: [phone]  - Dr. Jackquline: [phone]  - Dr. Claudene: [phone]   - Dr. Raymund: [phone]  In the event of inclement weather, please call our main line at [phone] for an update on the status of any delays or closures.  Dermatology Medication Tips: Please keep the boxes that topical medications come in in order to help keep track of the instructions about where and how to use these. Pharmacies typically print the medication instructions only on the boxes and not directly on the medication tubes.   If your medication is too expensive, please contact our office at [phone] option 4 or send us  a message through MyChart.   We are unable to tell what your co-pay for medications will be in advance as this is different depending on your insurance coverage. However, we may be able to find a substitute medication at lower cost or fill out paperwork to get insurance to cover a needed medication.   If a prior authorization is required to get your medication covered by your insurance company, please allow us  1-2 business days to complete this process.  Drug prices often vary depending on where the prescription is filled and some pharmacies may offer cheaper prices.  The website www.goodrx.com contains coupons for medications through different pharmacies. The prices  here do not account for what the cost may be with help from insurance (it may be cheaper with your insurance), but the website can give you the price if you did not use any insurance.  - You can print the associated coupon and take it with your prescription to the pharmacy.  - You may also stop by our office during regular business hours and pick up a GoodRx coupon card.  - If you need your prescription sent electronically to a different pharmacy, notify our office through [facility] or by phone at [phone] option 4.     Si Usted Necesita Algo Despus de Su Visita  Tambin puede enviarnos un mensaje a travs de Clinical cytogeneticist. Por lo general respondemos a los mensajes de MyChart en el transcurso de 1 a 2 das hbiles.  Para renovar recetas, por favor pida a su farmacia que  se ponga en contacto con nuestra oficina. Randi lakes de fax es [place] [phone].  Si tiene un asunto urgente cuando la clnica est cerrada y que no puede esperar hasta el siguiente da hbil, puede llamar/localizar a su doctor(a) al nmero que aparece a continuacin.   Por favor, tenga en cuenta que aunque hacemos todo lo posible para estar disponibles para asuntos urgentes fuera del horario de [place], no estamos disponibles las 24 horas del da, los 7 [address]  [address] de la [place].   Si tiene un problema urgente y no puede comunicarse con nosotros, puede optar por buscar atencin mdica  en el consultorio de su doctor(a), en una clnica privada, en un centro de atencin urgente o en una sala de emergencias.  Si tiene Engineer, drilling, por favor llame inmediatamente al 911 o vaya a la sala de emergencias.  Nmeros de bper  - Dr. Hester: [phone]  - Dra. Jackquline: [phone]  - Dr. Claudene: [phone]  - Dra. Kitts: [phone]  En caso de inclemencias del [place], por favor llame a nuestra lnea principal al [phone] para una actualizacin sobre el estado de cualquier retraso o cierre.  Consejos  para la medicacin en dermatologa: Por favor, guarde las cajas en las que vienen los medicamentos de uso tpico para ayudarle a seguir las instrucciones sobre dnde y cmo usarlos. Las farmacias generalmente imprimen las instrucciones del medicamento slo en las cajas y no directamente en los tubos del [place].   Si su medicamento es muy caro, por favor, pngase en contacto con landry rieger llamando al [phone] y presione la opcin 4 o envenos un mensaje a travs de Clinical cytogeneticist.   No podemos decirle cul ser su copago por los medicamentos por adelantado ya que esto es diferente dependiendo de la cobertura de su seguro. Sin embargo, es posible que podamos encontrar un medicamento sustituto a Audiological scientist un formulario para que el seguro cubra el medicamento que se considera necesario.   Si se requiere una autorizacin previa para que su compaa de seguros malta su medicamento, por favor permtanos de 1 a 2 das hbiles para completar este proceso.  Los precios de los medicamentos varan con frecuencia dependiendo del Environmental consultant de dnde se surte la receta y alguna farmacias pueden ofrecer precios ms baratos.  El sitio web www.goodrx.com tiene cupones para medicamentos de Health and safety inspector. Los precios aqu no tienen en cuenta lo que podra costar con la ayuda del seguro (puede ser ms barato con su seguro), pero el sitio web puede darle el precio si no utiliz Tourist information centre manager.  - Puede imprimir el cupn correspondiente y llevarlo con su receta a la farmacia.  - Tambin puede pasar por nuestra oficina durante el horario de atencin regular y Education officer, museum una tarjeta de cupones de GoodRx.  - Si necesita que su receta se enve electrnicamente a una farmacia diferente, informe a nuestra oficina a travs de MyChart de [place] o por telfono llamando al [phone] y presione la opcin 4.

## 2023-11-27 NOTE — Progress Notes (Signed)
   Follow-Up Visit   Subjective  Vernon Payne. is a 73 y.o. male who presents for the following: Patient here for treatment    The following portions of the chart were reviewed this encounter and updated as appropriate: medications, allergies, medical history  Review of Systems:  No other skin or systemic complaints except as noted in HPI or Assessment and Plan.  Objective  Well appearing patient in no apparent distress; mood and affect are within normal limits.  A focused examination was performed of the following areas: Left ear, neck  Relevant exam findings are noted in the Assessment and Plan.  R neck 1.5cm from R lobe Pink bx site L neck x 4, R post neck x 1, R lateral neck base x 1 (6) Erythematous thin papules/macules with gritty scale.   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 Destruction of lesion Complexity: simple   Destruction method: electrodesiccation and curettage   Informed consent: discussed and consent obtained   Timeout:  patient name, date of birth, surgical site, and procedure verified Procedure prep:  Patient was prepped and draped in usual sterile fashion Prep type:  Isopropyl alcohol Anesthesia: the lesion was anesthetized in a standard fashion   Anesthetic:  1% lidocaine  w/ epinephrine  1-100,000 local infiltration Curettage performed in three different directions: Yes   Electrodesiccation performed over the curetted area: Yes   Curettage cycles:  3 Final wound size (cm):  0.7 Hemostasis achieved with:  aluminum chloride and electrodesiccation Outcome: patient tolerated procedure well with no complications   Post-procedure details: sterile dressing applied and wound care instructions given   Dressing type: bandage and petrolatum    AK (ACTINIC KERATOSIS) (6) L neck x 4, R post neck x 1, R lateral neck base x 1 (6) Actinic keratoses are precancerous spots that appear secondary to cumulative UV  radiation exposure/sun exposure over time. They are chronic with expected duration over 1 year. A portion of actinic keratoses will progress to squamous cell carcinoma of the skin. It is not possible to reliably predict which spots will progress to skin cancer and so treatment is recommended to prevent development of skin cancer.  Recommend daily broad spectrum sunscreen SPF 30+ to sun-exposed areas, reapply every 2 hours as needed.  Recommend staying in the shade or wearing long sleeves, sun glasses (UVA+UVB protection) and wide brim hats (4-inch brim around the entire circumference of the hat). Call for new or changing lesions. Destruction of lesion - L neck x 4, R post neck x 1, R lateral neck base x 1 (6) Complexity: simple   Destruction method: cryotherapy   Informed consent: discussed and consent obtained   Timeout:  patient name, date of birth, surgical site, and procedure verified Lesion destroyed using liquid nitrogen: Yes   Region frozen until ice ball extended beyond lesion: Yes   Cryo cycles: 1 or 2. Outcome: patient tolerated procedure well with no complications   Post-procedure details: wound care instructions given     Return for TBSE, as scheduled, with Dr. LOIS LILLETTE Lonell Lorren, RMA, am acting as scribe for Boneta Sharps, MD .   Documentation: I have reviewed the above documentation for accuracy and completeness, and I agree with the above.  Boneta Sharps, MD

## 2023-11-27 NOTE — Therapy (Signed)
 OUTPATIENT PHYSICAL THERAPY NEURO TREATMENT    Patient Name: Vernon Payne. MRN: [id number] DOB:[birth year], 73 y.o., male Today's Date: 11/27/2023   PCP: Cleotilde Oneil FALCON, MD  REFERRING PROVIDER: Maree Jannett POUR, MD  END OF SESSION:  PT End of Session - 11/27/23 1154     Visit Number 15    Number of Visits 24    Date for Recertification  12/05/23    Progress Note Due on Visit 20    PT Start Time 1152    PT Stop Time 1231    PT Time Calculation (min) 39 min    Equipment Utilized During Treatment Gait belt    Activity Tolerance Patient tolerated treatment well    Behavior During Therapy WFL for tasks assessed/performed             Past Medical History:  Diagnosis Date   Actinic keratosis    Arthritis    Diabetes mellitus without complication (HCC)    GERD (gastroesophageal reflux disease)    Hard of hearing    left hearing aid   HLD (hyperlipidemia)    HTN (hypertension)    Hypotension, postural    IBS (irritable bowel syndrome)    Primary osteoarthritis of right knee    Sleep apnea    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 Tx pending   Squamous cell carcinoma of skin 01/20/2010   Right chest. Well differentiated. Excised: 01/27/2010   Squamous cell carcinoma of skin 07/15/2019   R preauricular ant to lobe   Squamous cell carcinoma of skin 10/18/2023   Left posterior helix. KA type. Mohs pending.   Stroke [facility])    May 31/ 2023   Wears dentures    partial upper, implants on bottom   Past Surgical History:  Procedure Laterality Date   APPENDECTOMY     BACK SURGERY     lumbar   CATARACT EXTRACTION W/PHACO Right 08/29/2017   Procedure: CATARACT EXTRACTION PHACO AND INTRAOCULAR LENS PLACEMENT (IOC) RIGHT DIABETIC;  Surgeon: Mittie Gaskin, MD;  Location: [facility];  Service: Ophthalmology;  Laterality: Right;  ISTENT INJECT   CATARACT EXTRACTION W/PHACO Left 11/21/2017   Procedure: CATARACT EXTRACTION  PHACO AND INTRAOCULAR LENS PLACEMENT (IOC);  Surgeon: Mittie Gaskin, MD;  Location: [facility];  Service: Ophthalmology;  Laterality: Left;   COLONOSCOPY WITH PROPOFOL  N/A 01/26/2023   Procedure: COLONOSCOPY WITH PROPOFOL ;  Surgeon: Onita Elspeth Sharper, DO;  Location: [facility] END[facility]COPY;  Service: Gastroenterology;  Laterality: N/A;   EYE SURGERY Left 11/21/2017   cataract excision   INSERTION OF ANTERIOR SEGMENT AQUEOUS DRAINAGE DEVICE (ISTENT) Right 08/29/2017   Procedure: (ISTENT);  Surgeon: Mittie Gaskin, MD;  Location: [facility];  Service: Ophthalmology;  Laterality: Right;  Diabetic - oral meds   INSERTION OF ANTERIOR SEGMENT AQUEOUS DRAINAGE DEVICE (ISTENT) Left 11/21/2017   Procedure: INSERTION OF ANTERIOR SEGMENT AQUEOUS DRAINAGE DEVICE (ISTENT)  INJECT DIABETES ISTENT INJECT;  Surgeon: Mittie Gaskin, MD;  Location: [facility];  Service: Ophthalmology;  Laterality: Left;  Diabetic - oral meds   JOINT REPLACEMENT     left knee replacement   KNEE ARTHROPLASTY Right 04/30/2023   Procedure: ARTHROPLASTY, KNEE, TOTAL, USING IMAGELESS COMPUTER-ASSISTED NAVIGATION;  Surgeon: Mardee Lynwood SQUIBB, MD;  Location: [facility] ORS;  Service: Orthopedics;  Laterality: Right;   KYPHOPLASTY N/A 03/28/2019   Procedure: L1 KYPHOPLASTY;  Surgeon: Kathlynn Sharper, MD;  Location: [facility] ORS;  Service: Orthopedics;  Laterality: N/A;  LASIK     POLYPECTOMY  01/26/2023   Procedure: POLYPECTOMY;  Surgeon: Onita Elspeth Sharper, DO;  Location: [facility] ENDOSCOPY;  Service: Gastroenterology;;   ROTATOR CUFF REPAIR     TONSILLECTOMY     Patient Active Problem List   Diagnosis Date Noted   SAH (subarachnoid hemorrhage) (HCC) 09/14/2023   SDH (subdural hematoma) (HCC) 09/14/2023   Fall 09/14/2023   Type 2 diabetes mellitus with peripheral neuropathy (HCC) 09/14/2023   Depression 09/14/2023   Focal seizure (HCC) 09/14/2023   History of total knee arthroplasty, right 04/30/2023   Acute  CVA (cerebrovascular accident) (HCC)    Right hemiparesis (HCC) 07/20/2021   Closed compression fracture of body of L1 vertebra (HCC) 04/28/2019   Family history of colon cancer 03/26/2018   Primary osteoarthritis of right knee 09/04/2017   Primary osteoarthritis of left knee 09/04/2017   Status post left partial knee replacement 09/04/2017   OSA on CPAP 01/24/2017   Yellow jacket sting 10/31/2016   HTN (hypertension) 10/31/2016   Type 2 diabetes mellitus with hyperlipidemia (HCC) 10/31/2016   HLD (hyperlipidemia) 10/31/2016   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   Benign essential HTN 10/15/2013    ONSET DATE: 04/30/23  REFERRING DIAG: R26.89 (ICD-10-CM) - Imbalance   THERAPY DIAG:  Difficulty in walking, not elsewhere classified  Abnormality of gait and mobility  Other abnormalities of gait and mobility  Unsteadiness on feet  Muscle weakness (generalized)  Acute CVA (cerebrovascular accident) (HCC)  Rationale for Evaluation and Treatment: Rehabilitation  SUBJECTIVE:                                                                                                                                                                                             SUBJECTIVE STATEMENT:  Patient reports falling down steps since last session- went to ED - treated and released. Report minor soreness in left knee and right shoulder.    From Eval: Pt had knee replacement in the R side on April 30, 2023. Pt had first fall on 06/2023. Pt had surgery with Dr. Hooten. Pt reports he still has clicking in the right knee and has pain in the right knee that he reports as being worse since before the surgery. Pt reports he is going to see the doctor in a few months again regarding the right knee. Pt reports not walking as well as he used to and notices he moves to the left when he is ambulating. Pt also notes recent unintentional weight loss, has discussed it with MD per his report.   Pt  accompanied by: significant other  PERTINENT HISTORY: Pt familiar to  clinic previously for post stroke rehab in later half of 2023. Pt has hx of  OA, Diabetes mellitus type 2, controlled, with complications, GERD, Glaucoma   Hemorrhoids   Hyperlipidemia,Hypertension, OSA on CPAP   PAIN:  Are you having pain? Yes: NPRS scale: 7 Pain location: R knee Pain description: constant Aggravating factors: kneeling Relieving factors: n/a  PRECAUTIONS: Fall  RED FLAGS: None   WEIGHT BEARING RESTRICTIONS: No  FALLS: Has patient fallen in last 6 months? Yes. Number of falls 6-7  LIVING ENVIRONMENT: Lives with: lives with their family Lives in: House/apartment Stairs: Yes: External: 7 steps; none Has following equipment at home: Single point cane and Walker - 2 wheeled  PLOF: Independent with basic ADLs and has lessened his activity and uses his wife to assist with things  PATIENT GOALS: Improve balance, gait and mobility   OBJECTIVE:  Note: Objective measures were completed at Evaluation unless otherwise noted.  DIAGNOSTIC FINDINGS: n/a  COGNITION: Overall cognitive status: Within functional limits for tasks assessed   SENSATION: Not tested  LOWER EXTREMITY ROM:     Active  Right Eval Left Eval  Hip flexion    Hip extension    Hip abduction    Hip adduction    Hip internal rotation    Hip external rotation    Knee flexion    Knee extension 15 deg from full ext seated AROM    Ankle dorsiflexion    Ankle plantarflexion    Ankle inversion    Ankle eversion     (Blank rows = not tested)  LOWER EXTREMITY MMT:    MMT Right Eval Left Eval  Hip flexion 3+ 4  Hip extension    Hip abduction 4 4+  Hip adduction 4+ 4+  Hip internal rotation    Hip external rotation    Knee flexion 4 4+  Knee extension 4 4+  Ankle dorsiflexion 4+ 4+  Ankle plantarflexion    Ankle inversion    Ankle eversion    (Blank rows = not tested)  BED MOBILITY:  No reported issues with  this  TRANSFERS: Sit to stand: Modified independence and CGA  Assistive device utilized: hands needed to complete     Stand to sit: Modified independence  Assistive device utilized: constols descent with hands        STAIRS: Pt reports using step to pattern with at least one fall on steps in past 6 months.  GAIT: Findings: Gait Characteristics: R knee does get to full extension, decreased step length- Right, decreased step length- Left, decreased stride length, decreased hip/knee flexion- Right, and decreased hip/knee flexion- Left, Distance walked: 30 ft, Level of assistance: Modified independence, and Comments: with turning around pt shows visible instability and mutiple small steps to keep balance, reaches for objects when near them in counter/furniture surfing style walking   FUNCTIONAL TESTS:   Orthostatic assessment: seated 142/76 HR 79 standing 136/73 HR 83  5 times sit to stand: 35.22 sec with heavyUE A Timed up and go (TUG): 19.88 sec  6 minute walk test: : test visit 2  10 meter walk test: .81 m/s BERG test visit 2   PATIENT SURVEYS:  ABC scale: The Activities-Specific Balance Confidence (ABC) Scale 0% 10 20 30  40 50 60 70 80 90 100% No confidence<->completely confident  "How confident are you that you will not lose your balance or become unsteady when you . . .   Date tested 09/12/23 10/31/23  Walk around the house 70% 60  2. Walk up or down stairs 60% 40  3. Bend over and pick up a slipper from in front of a closet floor 30% 30  4. Reach for a small can off a shelf at eye level 100% 90  5. Stand on tip toes and reach for something above your head 80% 80  6. Stand on a chair and reach for something 0% 10  7. Sweep the floor 40% 40  8. Walk outside the house to a car parked in the driveway 80% 90  9. Get into or out of a car 90% 100  10. Walk across a parking lot to the mall 80% 80  11. Walk up or down a ramp 80% 80  12. Walk in a crowded mall where people rapidly  walk past you 70% 80  13. Are bumped into by people as you walk through the mall 70% 60  14. Step onto or off of an escalator while you are holding onto the railing 60% 60  15. Step onto or off an escalator while holding onto parcels such that you cannot hold onto the railing 60% 0  16. Walk outside on icy sidewalks 10% 0  Total: #/16 61.9% 56.25%                                                                                                                                 TREATMENT DATE: 11/27/23   TherAct- To improve functional movements patterns for everyday tasks / TE- To improve strength, endurance, mobility, and function of specific targeted muscle groups or improve joint range of motion or improve muscle flexibility  The patient completed 6 minutes at level(s) 1-6 on the NuStep on rolling hills setting using both B LE  promote strength, endurance, and cardiorespiratory fitness. PT increased the resistance level and monitored the patient's response to the intervention throughout. The patient required min cueing for technique and supervision level of assistance.  Step up with BUE support x 15 reps each LE  Step down with BUE support x 10 reps (working on eccentric control)     STS from elevated pad in chair x 15 reps, cues for forward weight shift and proper arm movements for best mechanical advantage   -Resistive Gait with 3#AW x 300 ft   Therapeutic Exercise: therapeutic exercises to develop strength and endurance, range of motion and flexibility  -LAQ with 3# AW 2 x 10 reps ea    -Seated march x 10 reps with 5# AW 2 x 10      PATIENT EDUCATION: Education details: Pt educated throughout session about proper posture and technique with exercises. Improved exercise technique, movement at target joints, use of target muscles after min to mod verbal, visual, tactile cues Person educated: Patient Education method: Explanation Education comprehension: verbalized  understanding   HOME EXERCISE PROGRAM: Access Code: [email or web address] URL: [email or web address] Date: 09/25/2023 Prepared by: Lonni Gainer  Exercises - Marching Near Counter  -  1 x daily - 7 x weekly - 2 sets - 10 reps - Backward Walking with Counter Support  - 1 x daily - 7 x weekly - 2 sets   GOALS: Goals reviewed with patient? Yes  SHORT TERM GOALS: Target date: 10/10/2023  Patient will be independent in home exercise program to improve strength/mobility for better functional independence with ADLs. Baseline: No HEP currently  Goal status: INITIAL   LONG TERM GOALS: Target date: 12/05/2023  1.  Patient will complete five times sit to stand test in < 15 seconds indicating an increased LE strength and improved balance. Baseline: 35.22 sec  with heavy UE assist Goal status: INITIAL  2.  Patient will improve ABC score to 71.5%   to demonstrate statistically significant improvement in mobility and quality of life as it relates to their balance and mobility.  Baseline: 61.9% Goal status: INITIAL   3.  Patient will increase Berg Balance score by > 6 points to demonstrate decreased fall risk during functional activities. Baseline: test visit 2  Goal status: INITIAL   4.  Patient will reduce timed up and go to <11 seconds to reduce fall risk and demonstrate improved transfer/gait ability. Baseline: 19.88 sec Goal status: INITIAL  5.  Patient will increase 10 meter walk test to >1.67m/s as to improve gait speed for better community ambulation and to reduce fall risk. Baseline: .28m/s Goal status: INITIAL  6.  Patient will increase six minute walk test distance by greater than 150 ft for progression to community ambulator and improve gait ability Baseline: 511 ft Goal status: INITIAL    ASSESSMENT:  CLINICAL IMPRESSION:  Treatment continued to focus on improving BLE strength for improved mobility. He was able to progress with increased overall resistance with  resistive gait and no LOB today- able to stand with only mild report of soreness in left knee. He was able to practice step and demonstrate good eccentric control with step down from 1st step today.  Pt will continue to benefit from skilled physical therapy intervention to address impairments, improve QOL, and attain therapy goals.     OBJECTIVE IMPAIRMENTS: Abnormal gait, decreased activity tolerance, decreased balance, decreased endurance, decreased mobility, difficulty walking, decreased strength, and pain.   ACTIVITY LIMITATIONS: standing, squatting, stairs, transfers, and locomotion level  PARTICIPATION LIMITATIONS: shopping, community activity, and yard work  PERSONAL FACTORS: Age, Behavior pattern, Time since onset of injury/illness/exacerbation, and 3+ comorbidities: DM, HTN, OA, HLD, OSA, R knee pain are also affecting patient's functional outcome.   REHAB POTENTIAL: Good  CLINICAL DECISION MAKING: Evolving/moderate complexity  EVALUATION COMPLEXITY: Moderate  PLAN:  PT FREQUENCY: 2x/week  PT DURATION: 12 weeks  PLANNED INTERVENTIONS: [id number]- Physical Performance Testing, 97110-Therapeutic exercises, 97530- Therapeutic activity, [id number]- Neuromuscular re-education, 97535- Self Care, [id number]- Manual therapy, [phone]- Gait training, Balance training, Stair training, Joint mobilization, Cryotherapy, and Moist heat   PLAN FOR NEXT SESSION:   Introduce more ankle exercises. Continues with balance exercises Monitor symptoms with more strenuous exercises as this seems to be the reasoning behind his symptoms Endurance training and walking across uneven surfaces    Reyes LOISE London PT  Physical Therapist- [facility]   11/27/23, 2:48 PM

## 2023-11-29 ENCOUNTER — Ambulatory Visit

## 2023-11-29 DIAGNOSIS — R2681 Unsteadiness on feet: Secondary | ICD-10-CM

## 2023-11-29 DIAGNOSIS — R269 Unspecified abnormalities of gait and mobility: Secondary | ICD-10-CM

## 2023-11-29 DIAGNOSIS — R262 Difficulty in walking, not elsewhere classified: Secondary | ICD-10-CM | POA: Diagnosis not present

## 2023-11-29 DIAGNOSIS — R2689 Other abnormalities of gait and mobility: Secondary | ICD-10-CM

## 2023-11-29 NOTE — Therapy (Signed)
 OUTPATIENT PHYSICAL THERAPY TREATMENT  Patient Name: Vernon Payne. MRN: [id number] DOB:August 08, 1950, 73 y.o., male Today's Date: 11/29/2023   PCP: Cleotilde Oneil FALCON, MD  REFERRING PROVIDER: Maree Jannett POUR, MD  END OF SESSION:  PT End of Session - 11/29/23 1150     Visit Number 16    Number of Visits 24    Date for Recertification  12/05/23    Authorization Type [employer]    Progress Note Due on Visit 20    PT Start Time 1145    PT Stop Time 1225    PT Time Calculation (min) 40 min    Equipment Utilized During Treatment Gait belt    Activity Tolerance Patient tolerated treatment well    Behavior During Therapy WFL for tasks assessed/performed             Past Medical History:  Diagnosis Date   Actinic keratosis    Arthritis    Diabetes mellitus without complication (HCC)    GERD (gastroesophageal reflux disease)    Hard of hearing    left hearing aid   HLD (hyperlipidemia)    HTN (hypertension)    Hypotension, postural    IBS (irritable bowel syndrome)    Primary osteoarthritis of right knee    Sleep apnea    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 EDC 11/27/23   Squamous cell carcinoma of skin 01/20/2010   Right chest. Well differentiated. Excised: 01/27/2010   Squamous cell carcinoma of skin 07/15/2019   R preauricular ant to lobe   Squamous cell carcinoma of skin 10/18/2023   Left posterior helix. KA type. Mohs 11/20/23   Stroke [facility])    May 31/ 2023   Wears dentures    partial upper, implants on bottom   Past Surgical History:  Procedure Laterality Date   APPENDECTOMY     BACK SURGERY     lumbar   CATARACT EXTRACTION W/PHACO Right 08/29/2017   Procedure: CATARACT EXTRACTION PHACO AND INTRAOCULAR LENS PLACEMENT (IOC) RIGHT DIABETIC;  Surgeon: Mittie Gaskin, MD;  Location: [facility];  Service: Ophthalmology;  Laterality: Right;  ISTENT INJECT   CATARACT EXTRACTION W/PHACO Left 11/21/2017    Procedure: CATARACT EXTRACTION PHACO AND INTRAOCULAR LENS PLACEMENT (IOC);  Surgeon: Mittie Gaskin, MD;  Location: [facility];  Service: Ophthalmology;  Laterality: Left;   COLONOSCOPY WITH PROPOFOL  N/A 01/26/2023   Procedure: COLONOSCOPY WITH PROPOFOL ;  Surgeon: Onita Elspeth Sharper, DO;  Location: [facility] END[facility];  Service: Gastroenterology;  Laterality: N/A;   EYE SURGERY Left 11/21/2017   cataract excision   INSERTION OF ANTERIOR SEGMENT AQUEOUS DRAINAGE DEVICE (ISTENT) Right 08/29/2017   Procedure: (ISTENT);  Surgeon: Mittie Gaskin, MD;  Location: [facility];  Service: Ophthalmology;  Laterality: Right;  Diabetic - oral meds   INSERTION OF ANTERIOR SEGMENT AQUEOUS DRAINAGE DEVICE (ISTENT) Left 11/21/2017   Procedure: INSERTION OF ANTERIOR SEGMENT AQUEOUS DRAINAGE DEVICE (ISTENT)  INJECT DIABETES ISTENT INJECT;  Surgeon: Mittie Gaskin, MD;  Location: [facility];  Service: Ophthalmology;  Laterality: Left;  Diabetic - oral meds   JOINT REPLACEMENT     left knee replacement   KNEE ARTHROPLASTY Right 04/30/2023   Procedure: ARTHROPLASTY, KNEE, TOTAL, USING IMAGELESS COMPUTER-ASSISTED NAVIGATION;  Surgeon: Mardee Lynwood SQUIBB, MD;  Location: [facility] ORS;  Service: Orthopedics;  Laterality: Right;   KYPHOPLASTY N/A 03/28/2019   Procedure: L1 KYPHOPLASTY;  Surgeon: Kathlynn Sharper, MD;  Location: [facility];  Service: Orthopedics;  Laterality: N/A;   LASIK     POLYPECTOMY  01/26/2023   Procedure: POLYPECTOMY;  Surgeon: Onita Elspeth Sharper, DO;  Location: [facility] END[facility];  Service: Gastroenterology;;   ROTATOR CUFF REPAIR     TONSILLECTOMY     Patient Active Problem List   Diagnosis Date Noted   SAH (subarachnoid hemorrhage) (HCC) 09/14/2023   SDH (subdural hematoma) (HCC) 09/14/2023   Fall 09/14/2023   Type 2 diabetes mellitus with peripheral neuropathy (HCC) 09/14/2023   Depression 09/14/2023   Focal seizure (HCC) 09/14/2023   History of total knee  arthroplasty, right 04/30/2023   Acute CVA (cerebrovascular accident) (HCC)    Right hemiparesis (HCC) 07/20/2021   Closed compression fracture of body of L1 vertebra (HCC) 04/28/2019   Family history of colon cancer 03/26/2018   Primary osteoarthritis of right knee 09/04/2017   Primary osteoarthritis of left knee 09/04/2017   Status post left partial knee replacement 09/04/2017   OSA on CPAP 01/24/2017   Yellow jacket sting 10/31/2016   HTN (hypertension) 10/31/2016   Type 2 diabetes mellitus with hyperlipidemia (HCC) 10/31/2016   HLD (hyperlipidemia) 10/31/2016   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   PERTINENT HISTORY:  Pt had knee replacement in the R side on April 30, 2023. Pt had first fall on 06/2023. Pt had surgery with Dr. Hooten. Pt reports he still has clicking in the right knee and has pain in the right knee that he reports as being worse since before the surgery. Pt reports he is going to see the doctor in a few months again regarding the right knee. Pt reports not walking as well as he used to and notices he moves to the left when he is ambulating. Pt also notes recent unintentional weight loss, has discussed it with MD per his report.Pt familiar to clinic previously for post stroke rehab in later half of 2023. Pt has hx of  OA, Diabetes mellitus type 2, controlled, with complications, GERD,  Glaucoma, Hemorrhoids   Hyperlipidemia,Hypertension, OSA on CPAP   PAIN:  Are you having pain? No pain today on arrival   PRECAUTIONS: Fall  WEIGHT BEARING RESTRICTIONS: No  FALLS: Has patient fallen in last 6 months? Yes. Number of falls 6-7  LIVING ENVIRONMENT: Lives with: lives with their family Lives in: House/apartment Stairs: Yes: External: 7 steps; none Has following equipment at home: Single point cane and Walker - 2 wheeled  PLOF: Independent with basic ADLs and has lessened his activity and uses his wife to assist with things  PATIENT GOALS: Improve balance, gait and mobility   OBJECTIVE:                                                                                                                              TREATMENT DATE: 11/29/23  -1375ft AMB overground, no device, mild swerving as is typical; steady pacing, no frank LOB: 33m40s  -10xSTS from elevated plinth hands on knees (2 sets)  -with ye orange weight ball (5kg): 43ft forward eyes, 64ft eyes open backwards (4x each)  -redTB around thighs: straight plane AMB with horizontal head turns, calling out playing cards (~470ft total)  -Airex pad stance: trunk rotation and wall ball rebounding x12 each way   PATIENT EDUCATION: Education details: Pt educated throughout session about proper posture and technique with exercises. Improved exercise technique, movement at target joints, use of target muscles after min to mod verbal, visual, tactile cues Person educated: Patient Education method: Explanation Education comprehension: verbalized understanding   HOME EXERCISE PROGRAM: Access Code: [email or web address] URL: [email or web address] Date: 09/25/2023 Prepared by: Lonni Gainer  Exercises - Marching Near Counter  - 1 x daily - 7 x weekly - 2 sets - 10 reps - Backward Walking with Counter Support  - 1 x daily - 7 x weekly - 2 sets   GOALS: Goals reviewed with patient? Yes  SHORT TERM GOALS: Target  date: 10/10/2023  Patient will be independent in home exercise program to improve strength/mobility for better functional independence with ADLs. Baseline: No HEP currently  Goal status: INITIAL  LONG TERM GOALS: Target date: 12/05/2023  1.  Patient will complete five times sit to stand test in < 15 seconds indicating an increased LE strength and improved balance. Baseline: 35.22 sec  with heavy UE assist Goal status: INITIAL  2.  Patient will improve ABC score to 71.5%   to demonstrate statistically significant improvement in mobility and quality of life as it relates to their balance and mobility.  Baseline: 61.9% Goal status: INITIAL   3.  Patient will increase Berg Balance score by > 6 points to demonstrate decreased fall risk during functional activities. Baseline: test visit 2  Goal status: INITIAL   4.  Patient will reduce timed up and go to <11 seconds to reduce fall risk and demonstrate improved transfer/gait ability. Baseline: 19.88 sec Goal status: INITIAL  5.  Patient will increase 10 meter walk test to >1.48m/s as to improve gait speed for better community ambulation and to reduce fall  risk. Baseline: .61m/s Goal status: INITIAL  6.  Patient will increase six minute walk test distance by greater than 150 ft for progression to community ambulator and improve gait ability Baseline: 511 ft; 11/29/23: 936ft AMB  Goal status: INITIAL   ASSESSMENT:  CLINICAL IMPRESSION: Pt does well with overground AMB at start of session, far beyond initial metric, despite not formally being tested and asking to shift into beast mode. Pt partakes in a variety of motor control activities with mild intermittent LOB, mostly of which require minGuardA only, however he seems to have more LOB when turning head Right. Line of progression significantly deviated to Right side when eyes are closed.  Pt will benefit from skilled physical therapy intervention to address impairments, improve QOL, and  attain therapy goals.   OBJECTIVE IMPAIRMENTS: Abnormal gait, decreased activity tolerance, decreased balance, decreased endurance, decreased mobility, difficulty walking, decreased strength, and pain.   ACTIVITY LIMITATIONS: standing, squatting, stairs, transfers, and locomotion level  PARTICIPATION LIMITATIONS: shopping, community activity, and yard work  PERSONAL FACTORS: Age, Behavior pattern, Time since onset of injury/illness/exacerbation, and 3+ comorbidities: DM, HTN, OA, HLD, OSA, R knee pain are also affecting patient's functional outcome.   REHAB POTENTIAL: Good  CLINICAL DECISION MAKING: Evolving/moderate complexity  EVALUATION COMPLEXITY: Moderate  PLAN:  PT FREQUENCY: 2x/week PT DURATION: 12 weeks PLANNED INTERVENTIONS: 97750- Physical Performance Testing, 97110-Therapeutic exercises, 97530- Therapeutic activity, [id number]- Neuromuscular re-education, 97535- Self Care, [id number]- Manual therapy, [phone]- Gait training, Balance training, Stair training, Joint mobilization, Cryotherapy, and Moist heat   PLAN FOR NEXT SESSION:  Introduce more ankle exercises. Continues with balance exercises Monitor symptoms with more strenuous exercises as this seems to be the reasoning behind his symptoms Endurance training and walking across uneven surfaces   Peggye JAYSON Linear PT  Physical Therapist- [facility]   11/29/23, 12:02 PM  12:31 PM, 11/29/23 Peggye JAYSON Linear, PT, DPT Physical Therapist -  [facility]  Outpatient Physical Therapy- [facility] [phone]

## 2023-12-04 ENCOUNTER — Ambulatory Visit

## 2023-12-06 ENCOUNTER — Ambulatory Visit

## 2023-12-11 ENCOUNTER — Ambulatory Visit: Admitting: Physical Therapy

## 2023-12-11 DIAGNOSIS — R262 Difficulty in walking, not elsewhere classified: Secondary | ICD-10-CM | POA: Diagnosis not present

## 2023-12-11 DIAGNOSIS — R2689 Other abnormalities of gait and mobility: Secondary | ICD-10-CM

## 2023-12-11 DIAGNOSIS — R2681 Unsteadiness on feet: Secondary | ICD-10-CM

## 2023-12-11 DIAGNOSIS — R269 Unspecified abnormalities of gait and mobility: Secondary | ICD-10-CM

## 2023-12-11 NOTE — Therapy (Signed)
 OUTPATIENT PHYSICAL THERAPY TREATMENT/ RECERT   Patient Name: Vernon Payne. MRN: [id number] DOB:Jan 07, 1951, 73 y.o., male Today's Date: 12/11/2023   PCP: Cleotilde Oneil FALCON, MD  REFERRING PROVIDER: Maree Jannett POUR, MD  END OF SESSION:  PT End of Session - 12/11/23 1153     Visit Number 17    Number of Visits 24    Date for Recertification  03/04/24    Authorization Type [employer]    Progress Note Due on Visit 20    Equipment Utilized During Treatment Gait belt    Activity Tolerance Patient tolerated treatment well    Behavior During Therapy [facility] for tasks assessed/performed             Past Medical History:  Diagnosis Date   Actinic keratosis    Arthritis    Diabetes mellitus without complication (HCC)    GERD (gastroesophageal reflux disease)    Hard of hearing    left hearing aid   HLD (hyperlipidemia)    HTN (hypertension)    Hypotension, postural    IBS (irritable bowel syndrome)    Primary osteoarthritis of right knee    Sleep apnea    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 EDC 11/27/23   Squamous cell carcinoma of skin 01/20/2010   Right chest. Well differentiated. Excised: 01/27/2010   Squamous cell carcinoma of skin 07/15/2019   R preauricular ant to lobe   Squamous cell carcinoma of skin 10/18/2023   Left posterior helix. KA type. Mohs 11/20/23   Stroke [facility])    May 31/ 2023   Wears dentures    partial upper, implants on bottom   Past Surgical History:  Procedure Laterality Date   APPENDECTOMY     BACK SURGERY     lumbar   CATARACT EXTRACTION W/PHACO Right 08/29/2017   Procedure: CATARACT EXTRACTION PHACO AND INTRAOCULAR LENS PLACEMENT (IOC) RIGHT DIABETIC;  Surgeon: Mittie Gaskin, MD;  Location: [facility];  Service: Ophthalmology;  Laterality: Right;  ISTENT INJECT   CATARACT EXTRACTION W/PHACO Left 11/21/2017   Procedure: CATARACT EXTRACTION PHACO AND INTRAOCULAR LENS PLACEMENT (IOC);   Surgeon: Mittie Gaskin, MD;  Location: [facility];  Service: Ophthalmology;  Laterality: Left;   COLONOSCOPY WITH PROPOFOL  N/A 01/26/2023   Procedure: COLONOSCOPY WITH PROPOFOL ;  Surgeon: Onita Elspeth Sharper, DO;  Location: [facility] ENDOSCOPY;  Service: Gastroenterology;  Laterality: N/A;   EYE SURGERY Left 11/21/2017   cataract excision   INSERTION OF ANTERIOR SEGMENT AQUEOUS DRAINAGE DEVICE (ISTENT) Right 08/29/2017   Procedure: (ISTENT);  Surgeon: Mittie Gaskin, MD;  Location: [facility];  Service: Ophthalmology;  Laterality: Right;  Diabetic - oral meds   INSERTION OF ANTERIOR SEGMENT AQUEOUS DRAINAGE DEVICE (ISTENT) Left 11/21/2017   Procedure: INSERTION OF ANTERIOR SEGMENT AQUEOUS DRAINAGE DEVICE (ISTENT)  INJECT DIABETES ISTENT INJECT;  Surgeon: Mittie Gaskin, MD;  Location: [facility];  Service: Ophthalmology;  Laterality: Left;  Diabetic - oral meds   JOINT REPLACEMENT     left knee replacement   KNEE ARTHROPLASTY Right 04/30/2023   Procedure: ARTHROPLASTY, KNEE, TOTAL, USING IMAGELESS COMPUTER-ASSISTED NAVIGATION;  Surgeon: Mardee Lynwood SQUIBB, MD;  Location: [facility] ORS;  Service: Orthopedics;  Laterality: Right;   KYPHOPLASTY N/A 03/28/2019   Procedure: L1 KYPHOPLASTY;  Surgeon: Kathlynn Sharper, MD;  Location: [facility] ORS;  Service: Orthopedics;  Laterality: N/A;   LASIK     POLYPECTOMY  01/26/2023   Procedure: POLYPECTOMY;  Surgeon: Onita Elspeth Sharper,  DO;  Location: [facility] ENDOSCOPY;  Service: Gastroenterology;;   ROTATOR CUFF REPAIR     TONSILLECTOMY     Patient Active Problem List   Diagnosis Date Noted   SAH (subarachnoid hemorrhage) (HCC) 09/14/2023   SDH (subdural hematoma) (HCC) 09/14/2023   Fall 09/14/2023   Type 2 diabetes mellitus with peripheral neuropathy (HCC) 09/14/2023   Depression 09/14/2023   Focal seizure (HCC) 09/14/2023   History of total knee arthroplasty, right 04/30/2023   Acute CVA (cerebrovascular accident) (HCC)     Right hemiparesis (HCC) 07/20/2021   Closed compression fracture of body of L1 vertebra (HCC) 04/28/2019   Family history of colon cancer 03/26/2018   Primary osteoarthritis of right knee 09/04/2017   Primary osteoarthritis of left knee 09/04/2017   Status post left partial knee replacement 09/04/2017   OSA on CPAP 01/24/2017   Yellow jacket sting 10/31/2016   HTN (hypertension) 10/31/2016   Type 2 diabetes mellitus with hyperlipidemia (HCC) 10/31/2016   HLD (hyperlipidemia) 10/31/2016   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  Is preparing for her 65-month long cruise starting later this week.  PERTINENT HISTORY:  Pt had knee replacement in the R side on April 30, 2023. Pt had first fall on 06/2023. Pt had surgery with Dr. Hooten. Pt reports he still has clicking in the right knee and has pain in the right knee that he reports as being worse since before the surgery. Pt reports he is going to see the doctor in a few months again regarding the right knee. Pt reports not walking as well as he used to and notices he moves to the left when he is ambulating. Pt also notes recent unintentional weight loss, has discussed it with MD per his report.Pt familiar to clinic previously for post stroke rehab in later half of 2023. Pt has hx of  OA, Diabetes mellitus type 2, controlled, with complications, GERD, Glaucoma,  Hemorrhoids   Hyperlipidemia,Hypertension, OSA on CPAP   PAIN:  Are you having pain? No pain today on arrival  PRECAUTIONS: Fall  WEIGHT BEARING RESTRICTIONS: No  FALLS: Has patient fallen in last 6 months? Yes. Number of falls 6-7  LIVING ENVIRONMENT: Lives with: lives with their family Lives in: House/apartment Stairs: Yes: External: 7 steps; none Has following equipment at home: Single point cane and Walker - 2 wheeled  PLOF: Independent with basic ADLs and has lessened his activity and uses his wife to assist with things  PATIENT GOALS: Improve balance, gait and mobility   OBJECTIVE:                                                                                                                              TREATMENT DATE: 12/11/23  \  TA- To improve functional movements patterns for everyday tasks   Nustep level 2 x 6 min for B UE and LE reciprocal movement training.   Physical Performance Test or Measurement: a  physical performance test(s) or measurement (eg,  musculoskeletal, functional capacity), with written report,  each 15 mins    Five times Sit to Stand Test (FTSS)  TIME: 13.84 sec heavy UE use, more fluid movement than  previously    Cut off scores indicative of increased fall risk: >12 sec CVA, >16 sec PD, >13 sec vestibular (ANPTA Core Set of Outcome Measures for Adults with Neurologic Conditions, 2018)  PT instructed pt in TUG: 0/21:13.10 sec  sec ( >13.5 sec indicates increased fall risk)  Patient demonstrates increased fall risk as noted by score of  46 /56 on Berg Balance Scale.  (<36= high risk for falls, close to 100%; 37-45 significant >80%; 46-51 moderate >50%; 52-55 lower >25%)   [facility] PT Assessment - 12/11/23 0001       Berg Balance Test   Sit to Stand Able to stand  independently using hands (P)     Standing Unsupported Able to stand safely 2 minutes (P)     Sitting with Back Unsupported but Feet Supported on Floor or Stool Able to sit  safely and securely 2 minutes (P)     Stand to Sit Controls descent by using hands (P)     Transfers Able to transfer safely, definite need of hands (P)     Standing Unsupported with Eyes Closed Able to stand 10 seconds safely (P)     Standing Unsupported with Feet Together Able to place feet together independently and stand 1 minute safely (P)     From Standing, Reach Forward with Outstretched Arm Can reach confidently >25 cm (10) (P)     From Standing Position, Pick up Object from Floor Able to pick up shoe, needs supervision (P)     From Standing Position, Turn to Look Behind Over each Shoulder Looks behind one side only/other side shows less weight shift (P)     Turn 360 Degrees Able to turn 360 degrees safely but slowly (P)     Standing Unsupported, Alternately Place Feet on Step/Stool Able to stand independently and safely  and complete 8 steps in 20 seconds (P)     Standing Unsupported, One Foot in Front Able to plae foot ahead of the other independently and hold 30 seconds (P)     Standing on One Leg Able to lift leg independently and hold equal to or more than 3 seconds (P)     Total Score 46 (P)           6 Min Walk Test:  Instructed patient to ambulate as quickly and as safely as possible for 6 minutes using LRAD. Patient was allowed to take standing rest breaks without stopping the test, but if the patient required a sitting rest break the clock would be stopped and the test would be over.  Results: 10/21:1220 ft  Results indicate that the patient has reduced endurance with ambulation compared to age matched norms.  Age Matched Norms (in meters): 31-69 yo M: 82 F: 13, 17-79 yo M: 18 F: 471, 59-89 yo M: 417 F: 392 MDC: 58.21 meters (190.98 feet) or 50 meters (ANPTA Core Set of Outcome Measures for Adults with Neurologic Conditions, 2018)  10 Meter Walk Test: Patient instructed to walk 10 meters (32.8 ft) as quickly and as safely as possible at their normal speed Results:  10/21: 1.04 m/s  Cut off scores:   Household Ambulator  < 0.4 m/s  Limited Community Ambulator  0.4 - 0.8 m/s  [employer]  > 0.8 m/s  Increased fall risk  < 1.74m/s  Crossing a Street  >1.71m/s  MCID 0.05 m/s (small), 0.13 m/s (moderate), 0.06 m/s (significant)  (ANPTA Core Set of Outcome Measures for Adults with Neurologic Conditions, 2018)    ABC: 83.75%    PATIENT EDUCATION: Education details: Pt educated throughout session about proper posture and technique with exercises. Improved exercise technique, movement at target joints, use of target muscles after min to mod verbal, visual, tactile cues Person educated: Patient Education method: Explanation Education comprehension: verbalized understanding   HOME EXERCISE PROGRAM: Access Code: [email or web address] URL: [email or web address] Date: 09/25/2023 Prepared by: Lonni Gainer  Exercises - Marching Near Counter  - 1 x daily - 7 x weekly - 2 sets - 10 reps - Backward Walking with Counter Support  - 1 x daily - 7 x weekly - 2 sets   GOALS: Goals reviewed with patient? Yes  SHORT TERM GOALS: Target date: 10/10/2023  Patient will be independent in home exercise program to improve strength/mobility for better functional independence with ADLs. Baseline: No HEP currently 10/21: Not been doing Goal status: INITIAL  LONG TERM GOALS: Target date: 12/05/2023  1.  Patient will complete five times sit to stand test in < 15 seconds indicating an increased LE strength and improved balance. Baseline: 35.22 sec  with heavy UE assist 10/21: 13.84 sec heavy UE use, more fluid movement than  previously   Goal status: MET  2.  Patient will improve ABC score to 71.5%   to demonstrate statistically significant improvement in mobility and quality of life as it relates to their balance and mobility.  Baseline: 61.9% 10/21:83.75% Goal status: MET   3.  Patient will increase Berg Balance score by > 6 points to  demonstrate decreased fall risk during functional activities. Baseline: 39 on second visit 10/21: 46 Goal status: MET   4.  Patient will reduce timed up and go to <11 seconds to reduce fall risk and demonstrate improved transfer/gait ability. Baseline: 19.88 sec 10/21:13.10 sec  Goal status: ONGOING  5.  Patient will  increase 10 meter walk test to >1.56m/s as to improve gait speed for better community ambulation and to reduce fall risk. Baseline: .3m/s 10/21: 1.04 m/s Goal status: MET  6.  Patient will increase six minute walk test distance by greater than 150 ft for progression to community ambulator and improve gait ability Baseline: 511 ft; 11/29/23: 956ft AMB 10/21:1220 ft  Goal status: MET 7.  Patient will report no falls over a 1 month period to indicate decreased fall frequency and improved safety Baseline: Pt had a fall last week  Goal status: INITIAL  8.  Patient will improve Berg balance scale to 50 points or greater to indicate improved balance and decrease risk of falls Baseline: 46 Goal status: INITIAL      ASSESSMENT:  CLINICAL IMPRESSION:  Patient presents for recertification note this date.  Patient shows excellent progress toward all of his long-term goals meeting several of these goals and showing overall decreased risk of falls and improved functional capacity, muscular strength, and endurance.  Patient still at risk of falls and is to making progress toward his goals and would benefit from continued physical therapy.  Patient does have 57-month long cruise coming up where he will miss physical therapy interventions during that time.  Patient instructed to continue with exercise and activities during this time was instructed in ways he can perform these. Pt will continue to benefit from skilled physical therapy intervention to address impairments, improve QOL, and attain therapy goals. Patient's condition has the potential to improve in response to therapy. Maximum  improvement is yet to be obtained. The anticipated improvement is attainable and reasonable in a generally predictable time.     OBJECTIVE IMPAIRMENTS: Abnormal gait, decreased activity tolerance, decreased balance, decreased endurance, decreased mobility, difficulty walking, decreased strength, and pain.   ACTIVITY LIMITATIONS: standing, squatting, stairs, transfers, and locomotion level  PARTICIPATION LIMITATIONS: shopping, community activity, and yard work  PERSONAL FACTORS: Age, Behavior pattern, Time since onset of injury/illness/exacerbation, and 3+ comorbidities: DM, HTN, OA, HLD, OSA, R knee pain are also affecting patient's functional outcome.   REHAB POTENTIAL: Good  CLINICAL DECISION MAKING: Evolving/moderate complexity  EVALUATION COMPLEXITY: Moderate  PLAN:  PT FREQUENCY: 2x/week PT DURATION: 12 weeks PLANNED INTERVENTIONS: 97750- Physical Performance Testing, 97110-Therapeutic exercises, 97530- Therapeutic activity, [id number]- Neuromuscular re-education, 97535- Self Care, [id number]- Manual therapy, [phone]- Gait training, Balance training, Stair training, Joint mobilization, Cryotherapy, and Moist heat   PLAN FOR NEXT SESSION:  Introduce more ankle exercises. Continues with balance exercises Monitor symptoms with more strenuous exercises as this seems to be the reasoning behind his symptoms Endurance training and walking across uneven surfaces   Lonni KATHEE Gainer PT  Physical Therapist- [facility]   12/11/23, 1:27 PM

## 2023-12-12 ENCOUNTER — Encounter: Payer: Self-pay | Admitting: Dermatology

## 2023-12-12 ENCOUNTER — Ambulatory Visit (INDEPENDENT_AMBULATORY_CARE_PROVIDER_SITE_OTHER): Admitting: Dermatology

## 2023-12-12 VITALS — BP 182/93 | HR 82

## 2023-12-12 DIAGNOSIS — S01302D Unspecified open wound of left ear, subsequent encounter: Secondary | ICD-10-CM

## 2023-12-12 DIAGNOSIS — C4492 Squamous cell carcinoma of skin, unspecified: Secondary | ICD-10-CM

## 2023-12-12 DIAGNOSIS — T1490XD Injury, unspecified, subsequent encounter: Secondary | ICD-10-CM

## 2023-12-12 DIAGNOSIS — Z85828 Personal history of other malignant neoplasm of skin: Secondary | ICD-10-CM | POA: Diagnosis not present

## 2023-12-12 NOTE — Progress Notes (Signed)
   Follow Up Visit   Subjective  Alba Kriesel. is a 73 y.o. 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  The patient has been bandaging the wound as directed. The endorse the following concerns: none.  The following portions of the chart were reviewed this encounter and updated as appropriate: medications, allergies, medical history  Review of Systems:  No other skin or systemic complaints except as noted in HPI or Assessment and Plan.  Objective  Well appearing patient in no apparent distress; mood and affect are within normal limits.  A focal examination was performed including the left ear. All findings within normal limits unless otherwise noted below.  Healing wound with mild erythema  Relevant physical exam findings are noted in the Assessment and Plan.    Assessment & Plan   Healing Wound s/p Mohs for SCC on the left posterior helix, treated on 11/20/2023, healing by second intention.  - Reassured that wound is healing well - No evidence of infection - No swelling, induration, purulence, dehiscence, or tenderness out of proportion to the clinical exam, see photo above - Discussed that scars take up to 12 months to mature from the date of surgery - Recommend SPF 30+ to scar daily to prevent purple color from UV exposure during scar maturation process - Discussed that erythema and raised appearance of scar will fade over the next 4-6 months - OK to start scar massage at 4-6 weeks post-op - Can consider silicone based products for scar healing starting at 6 weeks post-op - Ok to continue ointment daily to wound under a bandage for another 3 weeks  HISTORY OF SQUAMOUS CELL CARCINOMA OF THE SKIN - No evidence of recurrence today - Recommend regular full body skin exams - Recommend daily broad spectrum  sunscreen SPF 30+ to sun-exposed areas, reapply every 2 hours as needed.  - Call if any new or changing lesions are noted between office visits  Return in about 2 months (around 02/11/2024) for when he comes back from his long cruise.   Documentation: I have reviewed the above documentation for accuracy and completeness, and I agree with the above.  RUFUS CHRISTELLA HOLY, MD

## 2023-12-13 ENCOUNTER — Ambulatory Visit: Admitting: Physical Therapy

## 2023-12-15 IMAGING — CT CT ANGIO HEAD-NECK (W OR W/O PERF)
2 of 7 series · 8 of 33 positions shown · IV contrast (APPLIED)
Comparison: Same-day noncontrast head CT

CLINICAL DATA: Right-sided weakness, dizziness

EXAM:
CT ANGIOGRAPHY HEAD AND NECK
TECHNIQUE: Multidetector CT imaging of the head and neck was performed using
the standard protocol during bolus administration of intravenous
contrast. Multiplanar CT image reconstructions and MIPs were
obtained to evaluate the vascular anatomy. Carotid stenosis
measurements (when applicable) are obtained utilizing NASCET
criteria, using the distal internal carotid diameter as the
denominator.

[Series 4: cta head neck · axial · 0.67mm/px · z∈[-360,-228]mm · 2 of 200 slices shown]
[im 67/200  soft-tissue]
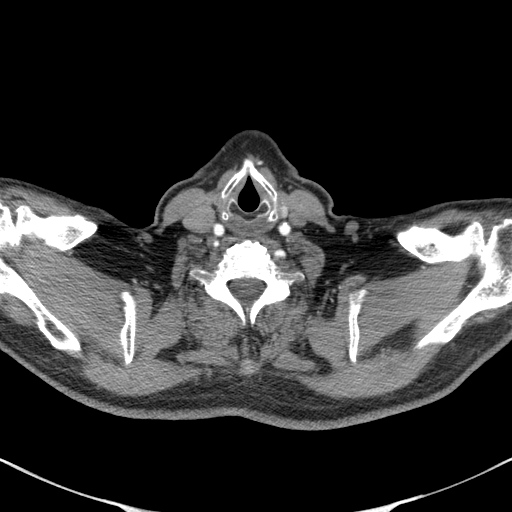
[im 133/200  soft-tissue]
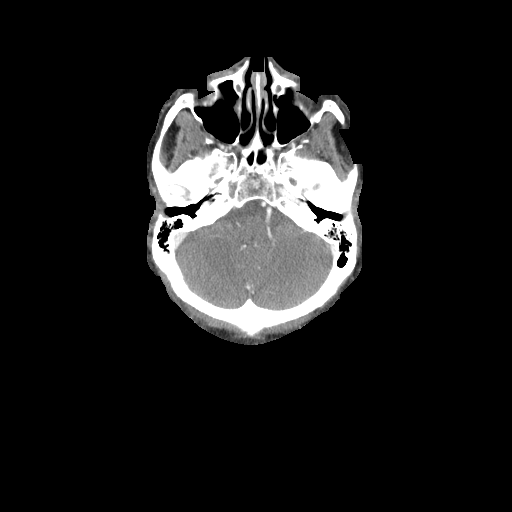

[Series 6: ax thin · axial · 0.50mm/px · z∈[-435,-149]mm · 6 of 402 slices shown]
[im 58/402  soft-tissue]
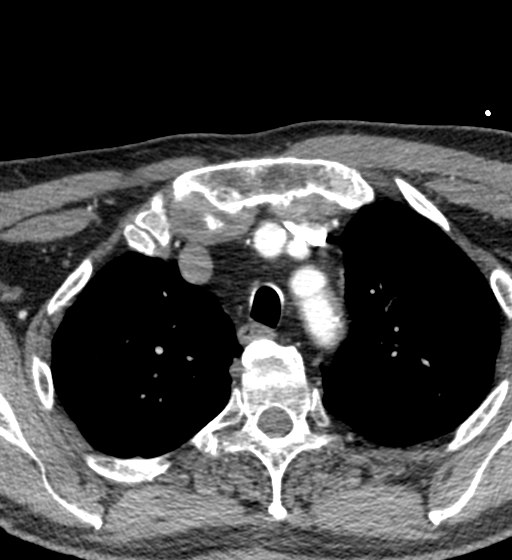
[im 115/402  bone]
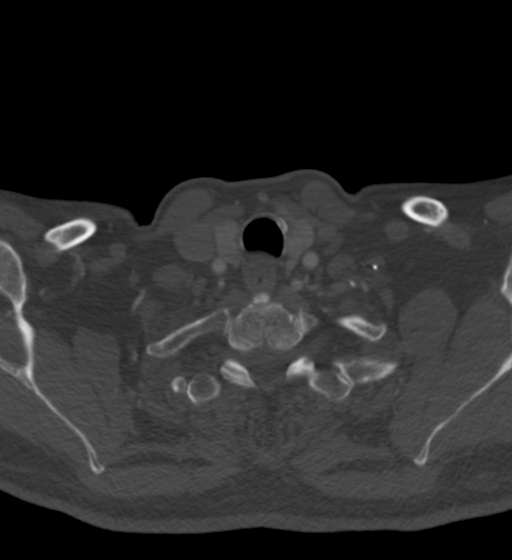
[im 172/402  soft-tissue]
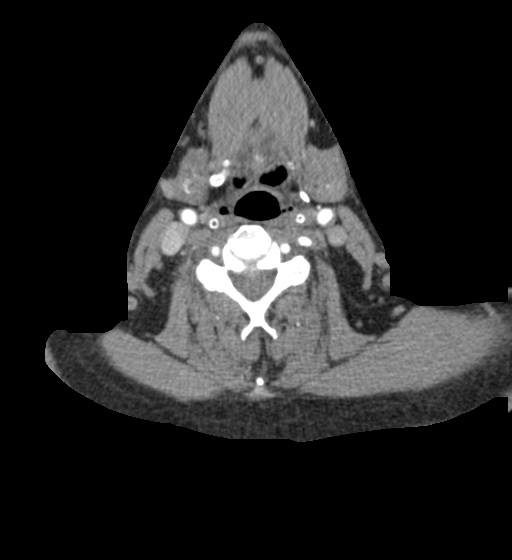
[im 230/402  bone]
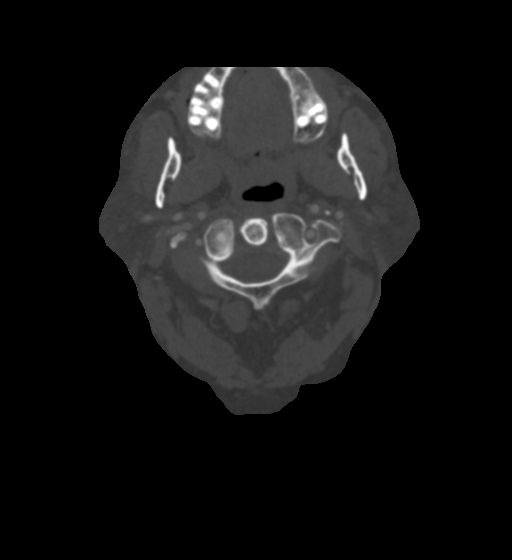
[im 287/402  soft-tissue]
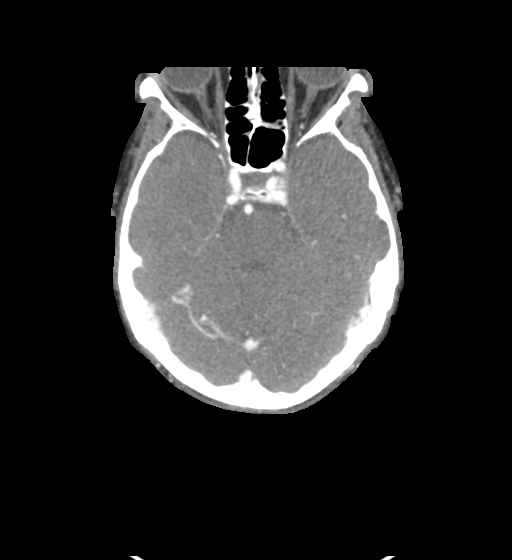
[im 344/402  bone]
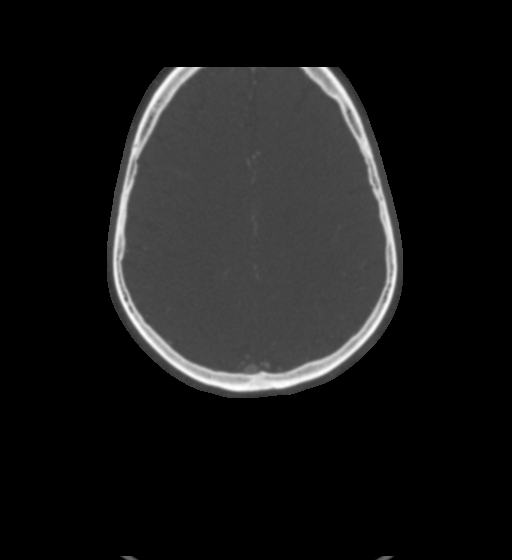

[8 of 33 positions shown; findings below may reference images not displayed]

RADIATION DOSE REDUCTION: This exam was performed according to the
departmental dose-optimization program which includes automated
exposure control, adjustment of the mA and/or kV according to
patient size and/or use of iterative reconstruction technique.

CONTRAST:  75mL OMNIPAQUE IOHEXOL 350 MG/ML SOLN
FINDINGS: CTA NECK FINDINGS

Aortic arch: There is mild calcified atherosclerotic plaque in the
imaged aortic arch. Origins of the major branch vessels are patent.
The subclavian arteries are patent to the level imaged.

Right carotid system: The right common carotid artery is patent.
There is mild calcified plaque at the bifurcation and proximal
internal carotid artery resulting in less than 50% stenosis. The
distal right internal carotid artery is patent. The right external
carotid artery is patent. There is no dissection or aneurysm.

Left carotid system: The left common carotid artery is patent with
scattered mixed plaque resulting in less than 50% stenosis. There is
mild plaque at the bifurcation and proximal left internal carotid
artery resulting in less than 50% stenosis. The distal left internal
carotid artery is patent. The left external carotid artery is
patent. There is no dissection or aneurysm.

Vertebral arteries: There is mild stenosis at the origin of the
right vertebral artery. The right vertebral artery is otherwise
widely patent. The left vertebral artery is patent. There is no
dissection or aneurysm.

Skeleton: There is degenerative change of the cervical spine most
advanced at C6-C7. There is no acute osseous abnormality or
suspicious osseous lesion.

Other neck: The soft tissues of the neck are unremarkable.

Upper chest: The imaged lung apices are clear.

Review of the MIP images confirms the above findings

CTA HEAD FINDINGS

Anterior circulation: There is mild calcified plaque in the
intracranial ICAs without hemodynamically significant stenosis

The bilateral MCAs are patent.

The bilateral ACAs are patent. The right A1 segment is diminutive,
likely a developmental variant. The anterior communicating artery is
normal.

There is no aneurysm or AVM.

Posterior circulation: The bilateral V4 segments are patent. PICA is
identified bilaterally. The basilar artery is patent.

There is multifocal moderate to severe stenosis of the right P1 and
moderate stenosis of the left P2 segment (7-117). The distal left
PCA is patent. The posterior communicating arteries are not
identified

There is no aneurysm or AVM.

Venous sinuses: Patent.

Anatomic variants: None.

Review of the MIP images confirms the above findings
IMPRESSION: 1. No emergent large vessel occlusion.
2. Multifocal moderate to severe stenosis of the right P1 and P2
segments and focal moderate stenosis of the left P2 segment.
Otherwise, patent intracranial vasculature.
3. Mild calcified plaque at the carotid bifurcations without
hemodynamically significant stenosis.
4. Mild stenosis at the origin of the right vertebral artery.

These results were called by telephone at the time of interpretation
on 07/20/2021 at [DATE] to provider Dr Korden, who verbally
acknowledged these results.

## 2023-12-15 IMAGING — MR MR HEAD W/O CM
12 series · 48 of 48 positions shown · non-contrast
Comparison: 4450

CLINICAL DATA: Right-sided weakness

EXAM:
MRI HEAD WITHOUT CONTRAST
TECHNIQUE: Multiplanar, multiecho pulse sequences of the brain and surrounding
structures were obtained without intravenous contrast.

[Series 5: ax dwi_tracew · axial · 3.0mm · 1.80mm/px · z∈[-64,+84]mm · 3 of 48 slices shown]
[im 1/48]
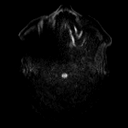
[im 24/48]
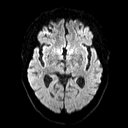
[im 48/48]
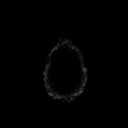

[Series 6: ax dwi_adc · axial · 3.0mm · 1.80mm/px · z∈[-64,+84]mm · 4 of 48 slices shown]
[im 1/48]
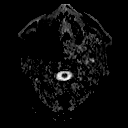
[im 16/48]
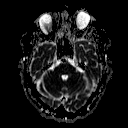
[im 32/48]
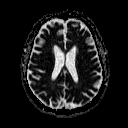
[im 48/48]
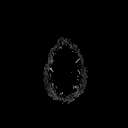

[Series 7: cor dwi_tracew · coronal · 5.0mm · 1.80mm/px · 3 of 38 slices shown]
[im 1/38]
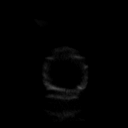
[im 19/38]
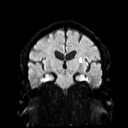
[im 38/38]
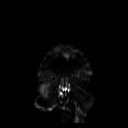

[Series 8: cor dwi_adc · coronal · 5.0mm · 1.80mm/px · 3 of 38 slices shown]
[im 1/38]
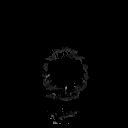
[im 19/38]
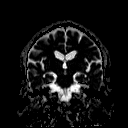
[im 38/38]
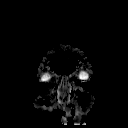

[Series 9: T1 · sagittal · 5.0mm · 0.62mm/px · 2 of 23 slices shown (1 of 2)]
[im 1/23]
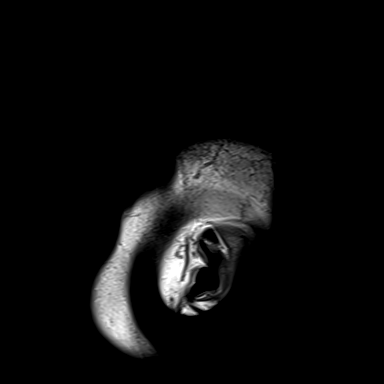
[im 23/23]
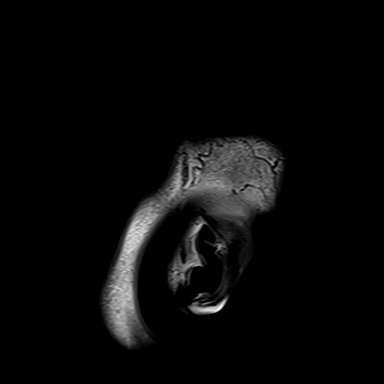

[Series 10: T2 · axial · 5.0mm · 0.86mm/px · z∈[-58,+80]mm · 2 of 25 slices shown (1 of 2)]
[im 1/25]
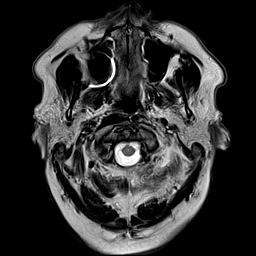
[im 25/25]
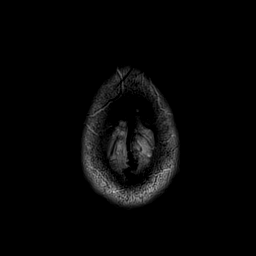

[Series 11: mag_images · axial · 3.0mm · 0.90mm/px · z∈[-61,+86]mm · 4 of 52 slices shown]
[im 1/52]
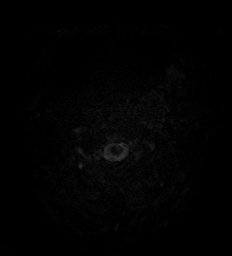
[im 18/52]
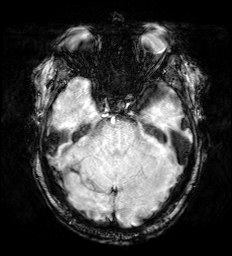
[im 35/52]
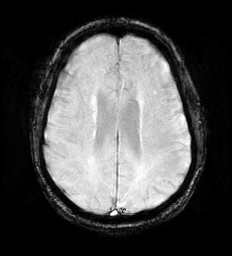
[im 52/52]
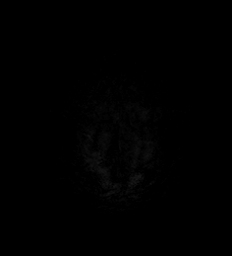

[Series 12: pha_images · axial · 3.0mm · 0.90mm/px · z∈[-61,+86]mm · 4 of 52 slices shown]
[im 1/52]
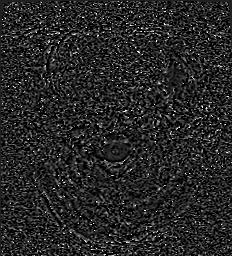
[im 18/52]
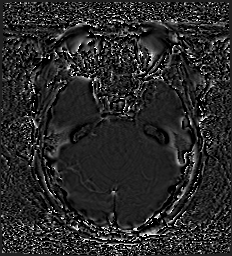
[im 35/52]
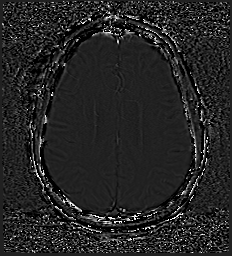
[im 52/52]
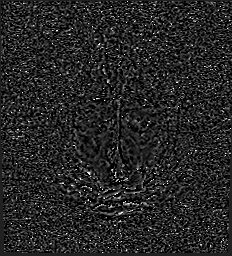

[Series 13: swi_images · axial · 3.0mm · 0.90mm/px · z∈[-61,+86]mm · 4 of 52 slices shown]
[im 1/52]
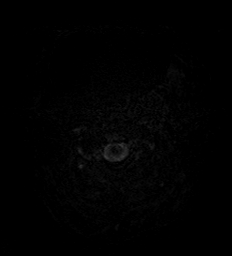
[im 18/52]
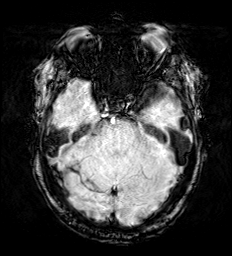
[im 35/52]
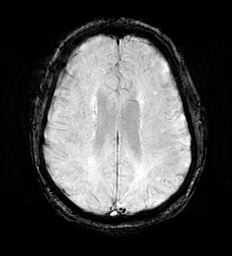
[im 52/52]
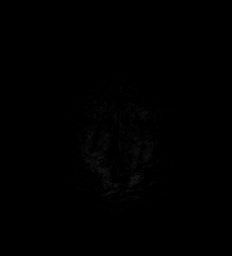

[Series 15: FLAIR · axial · 3.0mm · 0.69mm/px · z∈[-67,+88]mm · 4 of 55 slices shown]
[im 1/55]
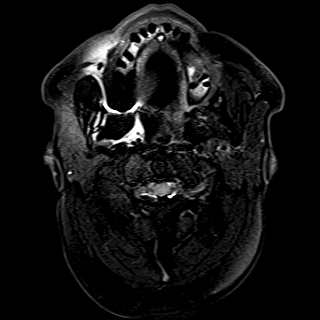
[im 19/55]
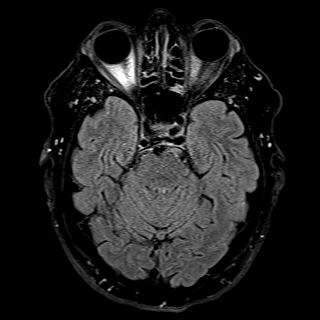
[im 37/55]
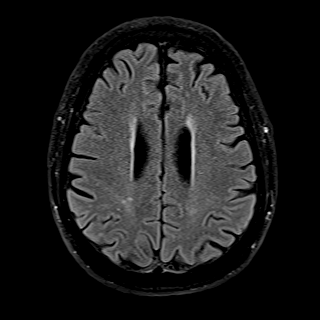
[im 55/55]
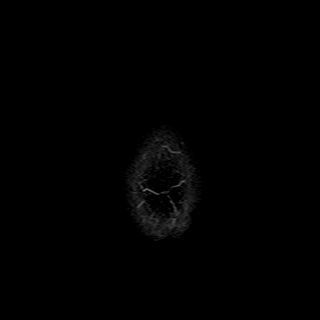

[Series 16: T1 · axial · 1.0mm · 0.98mm/px · z∈[-71,+97]mm · 13 of 176 slices shown (2 of 2)]
[im 1/176]
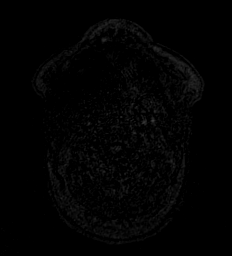
[im 15/176]
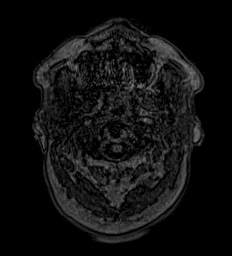
[im 30/176]
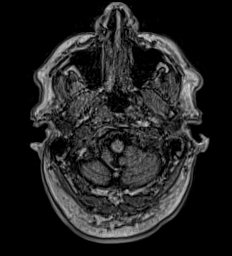
[im 44/176]
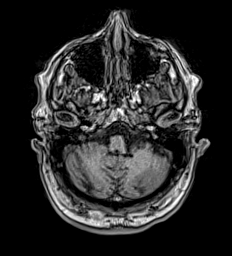
[im 59/176]
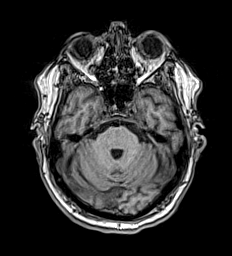
[im 73/176]
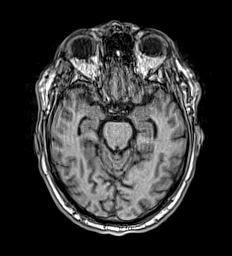
[im 88/176]
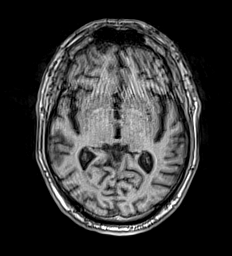
[im 103/176]
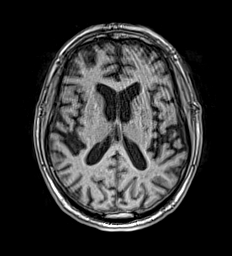
[im 117/176]
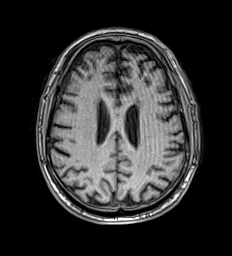
[im 132/176]
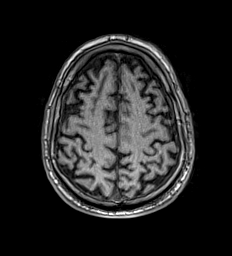
[im 146/176]
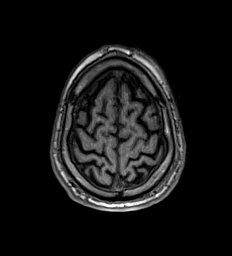
[im 161/176]
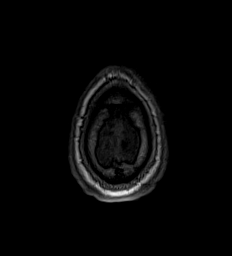
[im 176/176]
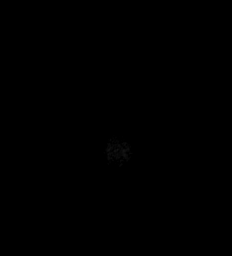

[Series 17: T2 · coronal · 5.0mm · 0.86mm/px · 2 of 29 slices shown (2 of 2)]
[im 1/29]
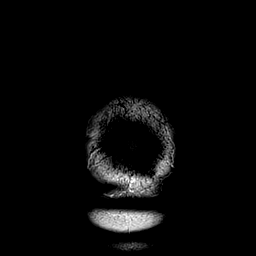
[im 29/29]
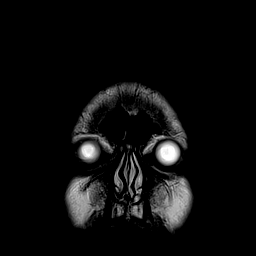

[48 of 48 positions shown; findings below may reference images not displayed]

FINDINGS: Brain: Small focus reduced diffusion is present in the left corona
radiata extending toward the lentiform nucleus. No evidence of
intracranial hemorrhage. There is no intracranial mass or mass
effect. Patchy foci of T2 hyperintensity in the supratentorial white
matter are nonspecific but may reflect mild chronic microvascular
ischemic changes. There is no hydrocephalus or extra-axial fluid
collection. Prominence of the ventricles and sulci reflects minor
parenchymal volume loss.

Vascular: Major vessel flow voids at the skull base are preserved.

Skull and upper cervical spine: Normal marrow signal is preserved.

Sinuses/Orbits: Mild mucosal thickening is the. Bilateral lens
replacements.

Other: Sella is unremarkable.  Mastoid air cells are clear.
IMPRESSION: Small acute infarct of the left corona radiata extending toward the
lentiform nucleus.

Mild chronic microvascular ischemic changes.

## 2023-12-20 ENCOUNTER — Ambulatory Visit

## 2024-01-15 ENCOUNTER — Ambulatory Visit: Admitting: Physical Therapy

## 2024-01-23 ENCOUNTER — Ambulatory Visit: Admitting: Physical Therapy

## 2024-01-25 ENCOUNTER — Ambulatory Visit: Admitting: Physical Therapy

## 2024-01-28 ENCOUNTER — Ambulatory Visit: Admitting: Physical Therapy

## 2024-01-30 ENCOUNTER — Ambulatory Visit: Admitting: Physical Therapy

## 2024-02-04 ENCOUNTER — Ambulatory Visit: Admitting: Physical Therapy

## 2024-02-06 ENCOUNTER — Ambulatory Visit: Admitting: Dermatology

## 2024-02-06 ENCOUNTER — Ambulatory Visit: Admitting: Physical Therapy

## 2024-02-06 ENCOUNTER — Encounter: Payer: Self-pay | Admitting: Dermatology

## 2024-02-06 DIAGNOSIS — C4492 Squamous cell carcinoma of skin, unspecified: Secondary | ICD-10-CM

## 2024-02-06 DIAGNOSIS — Z85828 Personal history of other malignant neoplasm of skin: Secondary | ICD-10-CM | POA: Diagnosis not present

## 2024-02-06 DIAGNOSIS — L905 Scar conditions and fibrosis of skin: Secondary | ICD-10-CM | POA: Diagnosis not present

## 2024-02-06 NOTE — Progress Notes (Signed)
° °  Follow Up Visit   Subjective  Vernon Payne. is a 73 y.o. 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  The patient has been bandaging the wound as directed. The endorse the following concerns: none. He is accompanied by his wife.   The following portions of the chart were reviewed this encounter and updated as appropriate: medications, allergies, medical history  Review of Systems:  No other skin or systemic complaints except as noted in HPI or Assessment and Plan.  Objective  Well appearing patient in no apparent distress; mood and affect are within normal limits.  A focal examination was performed including the left ear. All findings within normal limits unless otherwise noted below.  Healing wound with mild erythema  Relevant physical exam findings are noted in the Assessment and Plan.    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  - Reassured that wound is healing well - No evidence of infection - No swelling, induration, purulence, dehiscence, or tenderness out of proportion to the clinical exam, see photo above - Discussed that scars take up to 12 months to mature from the date of surgery - Recommend SPF 30+ to scar daily to prevent purple color from UV exposure during scar maturation process - Discussed that erythema and raised appearance of scar will fade over the next 4-6 months - OK to start scar massage at 4-6 weeks post-op - Can consider silicone based products for scar healing starting at 6 weeks post-op  HISTORY OF SQUAMOUS CELL CARCINOMA OF THE SKIN - No evidence of recurrence today - Recommend regular full body skin exams - Recommend daily broad spectrum sunscreen SPF 30+ to sun-exposed areas, reapply every 2 hours  as needed.  - Call if any new or changing lesions are noted between office visits  Return if symptoms worsen or fail to improve.  Documentation: I have reviewed the above documentation for accuracy and completeness, and I agree with the above.  Vernon CHRISTELLA HOLY, MD

## 2024-02-07 ENCOUNTER — Ambulatory Visit: Payer: Medicare PPO | Admitting: Dermatology

## 2024-02-07 ENCOUNTER — Encounter: Payer: Self-pay | Admitting: Dermatology

## 2024-02-07 DIAGNOSIS — Z85828 Personal history of other malignant neoplasm of skin: Secondary | ICD-10-CM | POA: Diagnosis not present

## 2024-02-07 DIAGNOSIS — D1801 Hemangioma of skin and subcutaneous tissue: Secondary | ICD-10-CM | POA: Diagnosis not present

## 2024-02-07 DIAGNOSIS — D229 Melanocytic nevi, unspecified: Secondary | ICD-10-CM

## 2024-02-07 DIAGNOSIS — Z1283 Encounter for screening for malignant neoplasm of skin: Secondary | ICD-10-CM

## 2024-02-07 DIAGNOSIS — L578 Other skin changes due to chronic exposure to nonionizing radiation: Secondary | ICD-10-CM

## 2024-02-07 DIAGNOSIS — L570 Actinic keratosis: Secondary | ICD-10-CM | POA: Diagnosis not present

## 2024-02-07 DIAGNOSIS — Z8589 Personal history of malignant neoplasm of other organs and systems: Secondary | ICD-10-CM

## 2024-02-07 DIAGNOSIS — L814 Other melanin hyperpigmentation: Secondary | ICD-10-CM

## 2024-02-07 DIAGNOSIS — W908XXA Exposure to other nonionizing radiation, initial encounter: Secondary | ICD-10-CM

## 2024-02-07 DIAGNOSIS — D485 Neoplasm of uncertain behavior of skin: Secondary | ICD-10-CM | POA: Diagnosis not present

## 2024-02-07 DIAGNOSIS — L821 Other seborrheic keratosis: Secondary | ICD-10-CM | POA: Diagnosis not present

## 2024-02-07 DIAGNOSIS — D0461 Carcinoma in situ of skin of right upper limb, including shoulder: Secondary | ICD-10-CM | POA: Diagnosis not present

## 2024-02-07 DIAGNOSIS — D0462 Carcinoma in situ of skin of left upper limb, including shoulder: Secondary | ICD-10-CM | POA: Diagnosis not present

## 2024-02-07 DIAGNOSIS — L820 Inflamed seborrheic keratosis: Secondary | ICD-10-CM

## 2024-02-07 DIAGNOSIS — D0460 Carcinoma in situ of skin of unspecified upper limb, including shoulder: Secondary | ICD-10-CM

## 2024-02-07 NOTE — Patient Instructions (Addendum)
 Biopsy Wound Care Instructions  Leave the original bandage on for 24 hours if possible.  If the bandage becomes soaked or soiled before that time, it is OK to remove it and examine the wound.  A small amount of post-operative bleeding is normal.  If excessive bleeding occurs, remove the bandage, place gauze over the site and apply continuous pressure (no peeking) over the area for 30 minutes. If this does not work, please call our clinic as soon as possible or page your doctor if it is after hours.   Once a day, cleanse the wound with soap and water. It is fine to shower. If a thick crust develops you may use a Q-tip dipped into dilute hydrogen peroxide (mix 1:1 with water) to dissolve it.  Hydrogen peroxide can slow the healing process, so use it only as needed.    After washing, apply petroleum jelly (Vaseline) or an antibiotic ointment (mupirocin ) if your doctor prescribed one for you, followed by a bandage.    For best healing, the wound should be covered with a layer of ointment at all times. If you are not able to keep the area covered with a bandage to hold the ointment in place, this may mean re-applying the ointment several times a day.  Continue this wound care until the wound has healed and is no longer open.   Itching and mild discomfort is normal during the healing process. However, if you develop pain or severe itching, please call our office.   If you have any discomfort, you can take Tylenol  (acetaminophen ) or ibuprofen as directed on the bottle. (Please do not take these if you have an allergy to them or cannot take them for another reason).  Some redness, tenderness and white or yellow material in the wound is normal healing.  If the area becomes very sore and red, or develops a thick yellow-green material (pus), it may be infected; please notify us .    If you have stitches, return to clinic as directed to have the stitches removed. You will continue wound care for 2-3 days after  the stitches are removed.   Wound healing continues for up to one year following surgery. It is not unusual to experience pain in the scar from time to time during the interval.  If the pain becomes severe or the scar thickens, you should notify the office.    A slight amount of redness in a scar is expected for the first six months.  After six months, the redness will fade and the scar will soften and fade.  The color difference becomes less noticeable with time.  If there are any problems, return for a post-op surgery check at your earliest convenience.  To improve the appearance of the scar, you can use silicone scar gel, cream, or sheets (such as Mederma or Serica) every night for up to one year. These are available over the counter (without a prescription).  Please call our office at [phone] for any questions or concerns.    Melanoma ABCDEs  Melanoma is the most dangerous type of skin cancer, and is the leading cause of death from skin disease.  You are more likely to develop melanoma if you: Have light-colored skin, light-colored eyes, or red or blond hair Spend a lot of time in the sun Tan regularly, either outdoors or in a tanning bed Have had blistering sunburns, especially during childhood Have a close family member who has had a melanoma Have atypical moles or large  birthmarks  Early detection of melanoma is key since treatment is typically straightforward and cure rates are extremely high if we catch it early.   The first sign of melanoma is often a change in a mole or a new dark spot.  The ABCDE system is a way of remembering the signs of melanoma.  A for asymmetry:  The two halves do not match. B for border:  The edges of the growth are irregular. C for color:  A mixture of colors are present instead of an even brown color. D for diameter:  Melanomas are usually (but not always) greater than 6mm - the size of a pencil eraser. E for evolution:  The spot keeps changing  in size, shape, and color.  Please check your skin once per month between visits. You can use a small mirror in front and a large mirror behind you to keep an eye on the back side or your body.   If you see any new or changing lesions before your next follow-up, please call to schedule a visit.  Please continue daily skin protection including broad spectrum sunscreen SPF 30+ to sun-exposed areas, reapplying every 2 hours as needed when you're outdoors.    Due to recent changes in healthcare laws, you may see results of your pathology and/or laboratory studies on MyChart before the doctors have had a chance to review them. We understand that in some cases there may be results that are confusing or concerning to you. Please understand that not all results are received at the same time and often the doctors may need to interpret multiple results in order to provide you with the best plan of care or course of treatment. Therefore, we ask that you please give us  2 business days to thoroughly review all your results before contacting the office for clarification. Should we see a critical lab result, you will be contacted sooner.   If You Need Anything After Your Visit  If you have any questions or concerns for your doctor, please call our main line at [phone] and press option 4 to reach your doctor's medical assistant. If no one answers, please leave a voicemail as directed and we will return your call as soon as possible. Messages left after 4 pm will be answered the following business day.   You may also send us  a message via MyChart. We typically respond to MyChart messages within 1-2 business days.  For prescription refills, please ask your pharmacy to contact our office. Our fax number is [phone].  If you have an urgent issue when the clinic is closed that cannot wait until the next business day, you can page your doctor at the number below.    Please note that while we do our best to  be available for urgent issues outside of office hours, we are not available 24/7.   If you have an urgent issue and are unable to reach us , you may choose to seek medical care at your doctor's office, retail clinic, urgent care center, or emergency room.  If you have a medical emergency, please immediately call 911 or go to the emergency department.  Pager Numbers  - Dr. Hester: [phone]  - Dr. Jackquline: [phone]  - Dr. Claudene: [phone]   - Dr. Raymund: [phone]  In the event of inclement weather, please call our main line at [phone] for an update on the status of any delays or closures.  Dermatology Medication Tips: Please keep the boxes that topical  medications come in in order to help keep track of the instructions about where and how to use these. Pharmacies typically print the medication instructions only on the boxes and not directly on the medication tubes.   If your medication is too expensive, please contact our office at [phone] option 4 or send us  a message through MyChart.   We are unable to tell what your co-pay for medications will be in advance as this is different depending on your insurance coverage. However, we may be able to find a substitute medication at lower cost or fill out paperwork to get insurance to cover a needed medication.   If a prior authorization is required to get your medication covered by your insurance company, please allow us  1-2 business days to complete this process.  Drug prices often vary depending on where the prescription is filled and some pharmacies may offer cheaper prices.  The website www.goodrx.com contains coupons for medications through different pharmacies. The prices here do not account for what the cost may be with help from insurance (it may be cheaper with your insurance), but the website can give you the price if you did not use any insurance.  - You can print the associated coupon and take it with your  prescription to the pharmacy.  - You may also stop by our office during regular business hours and pick up a GoodRx coupon card.  - If you need your prescription sent electronically to a different pharmacy, notify our office through [facility] or by phone at [phone] option 4.     Si Usted Necesita Algo Despus de Su Visita  Tambin puede enviarnos un mensaje a travs de Clinical Cytogeneticist. Por lo general respondemos a los mensajes de MyChart en el transcurso de 1 a 2 das hbiles.  Para renovar recetas, por favor pida a su farmacia que se ponga en contacto con nuestra oficina. Randi lakes de fax es [place] [phone].  Si tiene un asunto urgente cuando la clnica est cerrada y que no puede esperar hasta el siguiente da hbil, puede llamar/localizar a su doctor(a) al nmero que aparece a continuacin.   Por favor, tenga en cuenta que aunque hacemos todo lo posible para estar disponibles para asuntos urgentes fuera del horario de [place], no estamos disponibles las 24 horas del da, los 7 [address]  [address] de la [place].   Si tiene un problema urgente y no puede comunicarse con nosotros, puede optar por buscar atencin mdica  en el consultorio de su doctor(a), en una clnica privada, en un centro de atencin urgente o en una sala de emergencias.  Si tiene engineer, drilling, por favor llame inmediatamente al 911 o vaya a la sala de emergencias.  Nmeros de bper  - Dr. Hester: [phone]  - Dra. Jackquline: [phone]  - Dr. Claudene: [phone]  - Dra. Kitts: [phone]  En caso de inclemencias del [place], por favor llame a nuestra lnea principal al [phone] para una actualizacin sobre el estado de cualquier retraso o cierre.  Consejos para la medicacin en dermatologa: Por favor, guarde las cajas en las que vienen los medicamentos de uso tpico para ayudarle a seguir las instrucciones sobre dnde y cmo usarlos. Las farmacias generalmente imprimen las instrucciones del  medicamento slo en las cajas y no directamente en los tubos del [place].   Si su medicamento es muy caro, por favor, pngase en contacto con landry rieger llamando al [phone] y presione la opcin 4 o envenos un mensaje a travs de  MyChart.   No podemos decirle cul ser su copago por los medicamentos por adelantado ya que esto es diferente dependiendo de la cobertura de su seguro. Sin embargo, es posible que podamos encontrar un medicamento sustituto a audiological scientist un formulario para que el seguro cubra el medicamento que se considera necesario.   Si se requiere una autorizacin previa para que su compaa de seguros cubra su medicamento, por favor permtanos de 1 a 2 das hbiles para completar este proceso.  Los precios de los medicamentos varan con frecuencia dependiendo del environmental consultant de dnde se surte la receta y alguna farmacias pueden ofrecer precios ms baratos.  El sitio web www.goodrx.com tiene cupones para medicamentos de health and safety inspector. Los precios aqu no tienen en cuenta lo que podra costar con la ayuda del seguro (puede ser ms barato con su seguro), pero el sitio web puede darle el precio si no utiliz tourist information centre manager.  - Puede imprimir el cupn correspondiente y llevarlo con su receta a la farmacia.  - Tambin puede pasar por nuestra oficina durante el horario de atencin regular y education officer, museum una tarjeta de cupones de GoodRx.  - Si necesita que su receta se enve electrnicamente a una farmacia diferente, informe a nuestra oficina a travs de MyChart de [place] o por telfono llamando al [phone] y presione la opcin 4.

## 2024-02-07 NOTE — Progress Notes (Unsigned)
 Follow-Up Visit   Subjective  Vernon Payne. is a 73 y.o. male who presents for the following: Skin Cancer Screening and Full Body Skin Exam  The patient presents for Total-Body Skin Exam (TBSE) for skin cancer screening and mole check. The patient has spots, moles and lesions to be evaluated, some may be new or changing and the patient may have concern these could be cancer.  Hx SCC. Spot at right shoulder, left hand.   Patient accompanied by wife who contributes to history.  The following portions of the chart were reviewed this encounter and updated as appropriate: medications, allergies, medical history  Review of Systems:  No other skin or systemic complaints except as noted in HPI or Assessment and Plan.  Objective  Well appearing patient in no apparent distress; mood and affect are within normal limits.  A full examination was performed including scalp, head, eyes, ears, nose, lips, neck, chest, axillae, abdomen, back, buttocks, bilateral upper extremities, bilateral lower extremities, hands, feet, fingers, toes, fingernails, and toenails. All findings within normal limits unless otherwise noted below.   Relevant physical exam findings are noted in the Assessment and Plan.  R upper eyelid lat canthus x 1, R lat infraorbital x 1, chest & shoulders x 16, back x 5 (23) Erythematous stuck-on, waxy papule or plaque temple, forehead x 15 (15) Erythematous thin papules/macules with gritty scale.  right anterior deltoid Crusted plaque 1.2 cm  left dorsum hand prox to thumb Hyperkeratotic papule 1.2 cm  Assessment & Plan   SKIN CANCER SCREENING PERFORMED TODAY.  ACTINIC DAMAGE - Chronic condition, secondary to cumulative UV/sun exposure - diffuse scaly erythematous macules with underlying dyspigmentation - Recommend daily broad spectrum sunscreen SPF 30+ to sun-exposed areas, reapply every 2 hours as needed.  - Staying in the shade or wearing long sleeves, sun glasses  (UVA+UVB protection) and wide brim hats (4-inch brim around the entire circumference of the hat) are also recommended for sun protection.  - Call for new or changing lesions.  LENTIGINES, SEBORRHEIC KERATOSES, HEMANGIOMAS - Benign normal skin lesions - Benign-appearing - Call for any changes  MELANOCYTIC NEVI - Tan-brown and/or pink-flesh-colored symmetric macules and papules - Benign appearing on exam today - Observation - Call clinic for new or changing moles - Recommend daily use of broad spectrum spf 30+ sunscreen to sun-exposed areas.   HISTORY OF SQUAMOUS CELL CARCINOMA OF THE SKIN - No evidence of recurrence today - No lymphadenopathy - Recommend regular full body skin exams - Recommend daily broad spectrum sunscreen SPF 30+ to sun-exposed areas, reapply every 2 hours as needed.  - Call if any new or changing lesions are noted between office visits  HISTORY OF SQUAMOUS CELL CARCINOMA OF THE SKIN - No evidence of recurrence today - No lymphadenopathy - Recommend regular full body skin exams - Recommend daily broad spectrum sunscreen SPF 30+ to sun-exposed areas, reapply every 2 hours as needed.  - Call if any new or changing lesions are noted between office visit   INFLAMED SEBORRHEIC KERATOSIS (23) R upper eyelid lat canthus x 1, R lat infraorbital x 1, chest & shoulders x 16, back x 5 (23) Symptomatic, irritating, patient would like treated.  Benign-appearing.  Call clinic for new or changing lesions.   - Destruction of lesion - R upper eyelid lat canthus x 1, R lat infraorbital x 1, chest & shoulders x 16, back x 5 (23) Complexity: simple   Destruction method: cryotherapy   Informed consent: discussed  and consent obtained   Timeout:  patient name, date of birth, surgical site, and procedure verified Lesion destroyed using liquid nitrogen: Yes   Region frozen until ice ball extended beyond lesion: Yes   Outcome: patient tolerated procedure well with no complications    Post-procedure details: wound care instructions given    AK (ACTINIC KERATOSIS) (15) temple, forehead x 15 (15) Actinic keratoses are precancerous spots that appear secondary to cumulative UV radiation exposure/sun exposure over time. They are chronic with expected duration over 1 year. A portion of actinic keratoses will progress to squamous cell carcinoma of the skin. It is not possible to reliably predict which spots will progress to skin cancer and so treatment is recommended to prevent development of skin cancer.  Recommend daily broad spectrum sunscreen SPF 30+ to sun-exposed areas, reapply every 2 hours as needed.  Recommend staying in the shade or wearing long sleeves, sun glasses (UVA+UVB protection) and wide brim hats (4-inch brim around the entire circumference of the hat). Call for new or changing lesions. - Destruction of lesion - temple, forehead x 15 (15) Complexity: simple   Destruction method: cryotherapy   Informed consent: discussed and consent obtained   Timeout:  patient name, date of birth, surgical site, and procedure verified Lesion destroyed using liquid nitrogen: Yes   Region frozen until ice ball extended beyond lesion: Yes   Outcome: patient tolerated procedure well with no complications   Post-procedure details: wound care instructions given    NEOPLASM OF UNCERTAIN BEHAVIOR OF SKIN (2) right anterior deltoid - Epidermal / dermal shaving  Lesion diameter (cm):  1.2 Informed consent: discussed and consent obtained   Timeout: patient name, date of birth, surgical site, and procedure verified   Procedure prep:  Patient was prepped and draped in usual sterile fashion Prep type:  Isopropyl alcohol Anesthesia: the lesion was anesthetized in a standard fashion   Anesthetic:  1% lidocaine  w/ epinephrine  1-100,000 buffered w/ 8.4% NaHCO3 Instrument used: flexible razor blade   Hemostasis achieved with: pressure, aluminum chloride and electrodesiccation   Outcome:  patient tolerated procedure well   Post-procedure details: sterile dressing applied and wound care instructions given   Dressing type: bandage and petrolatum    - Destruction of lesion Complexity: extensive   Destruction method: electrodesiccation and curettage   Informed consent: discussed and consent obtained   Timeout:  patient name, date of birth, surgical site, and procedure verified Procedure prep:  Patient was prepped and draped in usual sterile fashion Prep type:  Isopropyl alcohol Anesthesia: the lesion was anesthetized in a standard fashion   Anesthetic:  1% lidocaine  w/ epinephrine  1-100,000 buffered w/ 8.4% NaHCO3 Curettage performed in three different directions: Yes   Electrodesiccation performed over the curetted area: Yes   Lesion length (cm):  1.2 Lesion width (cm):  1.2 Margin per side (cm):  0.2 Final wound size (cm):  1.6 Hemostasis achieved with:  pressure, aluminum chloride and electrodesiccation Outcome: patient tolerated procedure well with no complications   Post-procedure details: sterile dressing applied and wound care instructions given   Dressing type: bandage and petrolatum    Specimen 1 - Surgical pathology Differential Diagnosis: r/o BCC  Check Margins: No EDC left dorsum hand prox to thumb - Epidermal / dermal shaving  Lesion diameter (cm):  1.2 Informed consent: discussed and consent obtained   Timeout: patient name, date of birth, surgical site, and procedure verified   Procedure prep:  Patient was prepped and draped in usual sterile fashion Prep  type:  Isopropyl alcohol Anesthesia: the lesion was anesthetized in a standard fashion   Anesthetic:  1% lidocaine  w/ epinephrine  1-100,000 buffered w/ 8.4% NaHCO3 Instrument used: flexible razor blade   Hemostasis achieved with: pressure, aluminum chloride and electrodesiccation   Outcome: patient tolerated procedure well   Post-procedure details: sterile dressing applied and wound care  instructions given   Dressing type: bandage and petrolatum    - Destruction of lesion Complexity: extensive   Destruction method: electrodesiccation and curettage   Informed consent: discussed and consent obtained   Timeout:  patient name, date of birth, surgical site, and procedure verified Procedure prep:  Patient was prepped and draped in usual sterile fashion Prep type:  Isopropyl alcohol Anesthesia: the lesion was anesthetized in a standard fashion   Anesthetic:  1% lidocaine  w/ epinephrine  1-100,000 buffered w/ 8.4% NaHCO3 Curettage performed in three different directions: Yes   Electrodesiccation performed over the curetted area: Yes   Lesion length (cm):  1.2 Lesion width (cm):  1.2 Margin per side (cm):  0.2 Final wound size (cm):  1.6 Hemostasis achieved with:  pressure, aluminum chloride and electrodesiccation Outcome: patient tolerated procedure well with no complications   Post-procedure details: sterile dressing applied and wound care instructions given   Dressing type: bandage and petrolatum    Specimen 2 - Surgical pathology Differential Diagnosis: r/o SCC  Check Margins: No EDC SKIN CANCER SCREENING   LENTIGO   MELANOCYTIC NEVUS, UNSPECIFIED LOCATION   ACTINIC SKIN DAMAGE   HISTORY OF SQUAMOUS CELL CARCINOMA   SEBORRHEIC KERATOSIS   Return in about 6 months (around 08/07/2024) for TBSE, with Dr. MARLA, HxSCC, HxAK.  LILLETTE Lonell Drones, RMA, am acting as scribe for Alm Rhyme, MD .   Documentation: I have reviewed the above documentation for accuracy and completeness, and I agree with the above.  Alm Rhyme, MD

## 2024-02-08 LAB — SURGICAL PATHOLOGY

## 2024-02-11 ENCOUNTER — Ambulatory Visit: Payer: Self-pay | Admitting: Dermatology

## 2024-02-11 ENCOUNTER — Encounter: Payer: Self-pay | Admitting: Dermatology

## 2024-02-11 NOTE — Telephone Encounter (Signed)
 Advised pt of bx results/sh ?

## 2024-02-11 NOTE — Telephone Encounter (Signed)
-----   Message from Alm Rhyme, MD sent at 02/11/2024  5:06 PM EST ----- FINAL DIAGNOSIS        1. 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 Skin, left dorsum hand prox to thumb :       SQUAMOUS CELL CARCINOMA IN SITU   1&2 - Both Cancer = SCCis Superficial Both already treated Recheck next visit

## 2024-02-19 ENCOUNTER — Ambulatory Visit: Admitting: Physical Therapy

## 2024-02-25 ENCOUNTER — Ambulatory Visit: Attending: Neurology

## 2024-02-25 ENCOUNTER — Telehealth: Payer: Self-pay

## 2024-02-25 NOTE — Therapy (Incomplete)
 " OUTPATIENT PHYSICAL THERAPY TREATMENT/ RECERT   Patient Name: Vernon Payne. MRN: [id number] DOB:07-28-1950, 74 y.o., male 75 Date: 02/25/2024   PCP: Cleotilde Oneil FALCON, MD  REFERRING PROVIDER: Maree Jannett POUR, MD  END OF SESSION:       Past Medical History:  Diagnosis Date   Actinic keratosis    Arthritis    Diabetes mellitus without complication (HCC)    GERD (gastroesophageal reflux disease)    Hard of hearing    left hearing aid   HLD (hyperlipidemia)    HTN (hypertension)    Hypotension, postural    IBS (irritable bowel syndrome)    Primary osteoarthritis of right knee    Sleep apnea    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 EDC 11/27/23   Squamous cell carcinoma in situ 02/07/2024   right anterior deltoid, EDC   Squamous cell carcinoma in situ 02/07/2024   left dorsum hand prox to thumb, EDC   Squamous cell carcinoma of skin 01/20/2010   Right chest. Well differentiated. Excised: 01/27/2010   Squamous cell carcinoma of skin 07/15/2019   R preauricular ant to lobe   Squamous cell carcinoma of skin 10/18/2023   Left posterior helix. KA type. Mohs 11/20/23   Stroke [facility])    May 31/ 2023   Wears dentures    partial upper, implants on bottom   Past Surgical History:  Procedure Laterality Date   APPENDECTOMY     BACK SURGERY     lumbar   CATARACT EXTRACTION W/PHACO Right 08/29/2017   Procedure: CATARACT EXTRACTION PHACO AND INTRAOCULAR LENS PLACEMENT (IOC) RIGHT DIABETIC;  Surgeon: Mittie Gaskin, MD;  Location: [facility];  Service: Ophthalmology;  Laterality: Right;  ISTENT INJECT   CATARACT EXTRACTION W/PHACO Left 11/21/2017   Procedure: CATARACT EXTRACTION PHACO AND INTRAOCULAR LENS PLACEMENT (IOC);  Surgeon: Mittie Gaskin, MD;  Location: [facility];  Service: Ophthalmology;  Laterality: Left;   COLONOSCOPY WITH PROPOFOL  N/A 01/26/2023   Procedure: COLONOSCOPY WITH PROPOFOL ;  Surgeon:  Onita Elspeth Sharper, DO;  Location: [facility] ENDOSCOPY;  Service: Gastroenterology;  Laterality: N/A;   EYE SURGERY Left 11/21/2017   cataract excision   INSERTION OF ANTERIOR SEGMENT AQUEOUS DRAINAGE DEVICE (ISTENT) Right 08/29/2017   Procedure: (ISTENT);  Surgeon: Mittie Gaskin, MD;  Location: [facility];  Service: Ophthalmology;  Laterality: Right;  Diabetic - oral meds   INSERTION OF ANTERIOR SEGMENT AQUEOUS DRAINAGE DEVICE (ISTENT) Left 11/21/2017   Procedure: INSERTION OF ANTERIOR SEGMENT AQUEOUS DRAINAGE DEVICE (ISTENT)  INJECT DIABETES ISTENT INJECT;  Surgeon: Mittie Gaskin, MD;  Location: [facility];  Service: Ophthalmology;  Laterality: Left;  Diabetic - oral meds   JOINT REPLACEMENT     left knee replacement   KNEE ARTHROPLASTY Right 04/30/2023   Procedure: ARTHROPLASTY, KNEE, TOTAL, USING IMAGELESS COMPUTER-ASSISTED NAVIGATION;  Surgeon: Mardee Lynwood SQUIBB, MD;  Location: [facility] ORS;  Service: Orthopedics;  Laterality: Right;   KYPHOPLASTY N/A 03/28/2019   Procedure: L1 KYPHOPLASTY;  Surgeon: Kathlynn Sharper, MD;  Location: [facility] ORS;  Service: Orthopedics;  Laterality: N/A;   LASIK     POLYPECTOMY  01/26/2023   Procedure: POLYPECTOMY;  Surgeon: Onita Elspeth Sharper, DO;  Location: [facility] END[facility]COPY;  Service: Gastroenterology;;   ROTATOR CUFF REPAIR     TONSILLECTOMY     Patient Active Problem List   Diagnosis Date Noted   SAH (subarachnoid hemorrhage) (HCC) 09/14/2023   SDH (subdural hematoma) (HCC) 09/14/2023   Fall  09/14/2023   Type 2 diabetes mellitus with peripheral neuropathy (HCC) 09/14/2023   Depression 09/14/2023   Focal seizure (HCC) 09/14/2023   History of total knee arthroplasty, right 04/30/2023   Acute CVA (cerebrovascular accident) (HCC)    Right hemiparesis (HCC) 07/20/2021   Closed compression fracture of body of L1 vertebra (HCC) 04/28/2019   Family history of colon cancer 03/26/2018   Primary osteoarthritis of right knee 09/04/2017    Primary osteoarthritis of left knee 09/04/2017   Status post left partial knee replacement 09/04/2017   OSA on CPAP 01/24/2017   Yellow jacket sting 10/31/2016   HTN (hypertension) 10/31/2016   Type 2 diabetes mellitus with hyperlipidemia (HCC) 10/31/2016   HLD (hyperlipidemia) 10/31/2016   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  Rationale for Evaluation and Treatment: Rehabilitation  SUBJECTIVE:                                                                                                                                                                                             SUBJECTIVE STATEMENT: Patient has returned from his 2 month long cruise.   PERTINENT HISTORY:  Pt had knee replacement in the R side on April 30, 2023. Pt had first fall on 06/2023. Pt had surgery with Dr. Hooten. Pt reports he still has clicking in the right knee and has pain in the right knee that he reports as being worse since before the surgery. Pt reports he is going to see the doctor in a few months again regarding the right knee. Pt reports not walking as well as he used to and notices he moves to the left when he is ambulating. Pt also notes recent unintentional weight loss, has discussed it with MD per his report.Pt familiar to clinic previously for post stroke rehab in later half of 2023. Pt has hx of  OA, Diabetes mellitus type 2, controlled, with complications, GERD, Glaucoma, Hemorrhoids   Hyperlipidemia,Hypertension, OSA on CPAP   PAIN:  Are you having pain? No pain today on arrival   PRECAUTIONS: Fall  WEIGHT BEARING RESTRICTIONS: No  FALLS: Has patient fallen in last 6 months? Yes. Number of falls 6-7  LIVING ENVIRONMENT: Lives with: lives with their family Lives in: House/apartment Stairs: Yes: External: 7 steps; none Has following  equipment at home: Single point cane and Walker - 2 wheeled  PLOF: Independent with basic ADLs and has lessened his activity and uses his wife to assist with things  PATIENT GOALS:  Improve balance, gait and mobility   OBJECTIVE:                                                                                                                              TREATMENT DATE: 02/25/2024    TA- To improve functional movements patterns for everyday tasks   *** Physical Performance Test or Measurement: a  physical performance test(s) or measurement (eg,  musculoskeletal, functional capacity), with written report,  each 15 mins    BERG: PT instructed pt in TUG: *** sec (average of 3 trials; >13.5 sec indicates increased fall risk)  PATIENT EDUCATION: Education details: Pt educated throughout session about proper posture and technique with exercises. Improved exercise technique, movement at target joints, use of target muscles after min to mod verbal, visual, tactile cues Person educated: Patient Education method: Explanation Education comprehension: verbalized understanding   HOME EXERCISE PROGRAM: Access Code: [email or web address] URL: [email or web address] Date: 09/25/2023 Prepared by: Lonni Gainer  Exercises - Marching Near Counter  - 1 x daily - 7 x weekly - 2 sets - 10 reps - Backward Walking with Counter Support  - 1 x daily - 7 x weekly - 2 sets   GOALS: Goals reviewed with patient? Yes  SHORT TERM GOALS: Target date: 10/10/2023 ***  Patient will be independent in home exercise program to improve strength/mobility for better functional independence with ADLs. Baseline: No HEP currently 10/21: Not been doing Goal status: INITIAL  LONG TERM GOALS: Target date: 12/05/2023 ***  1.  Patient will complete five times sit to stand test in < 15 seconds indicating an increased LE strength and improved balance. Baseline: 35.22 sec  with heavy UE assist 10/21: 13.84 sec heavy UE  use, more fluid movement than  previously   Goal status: MET  2.  Patient will improve ABC score to 71.5%   to demonstrate statistically significant improvement in mobility and quality of life as it relates to their balance and mobility.  Baseline: 61.9% 10/21:83.75% Goal status: MET   3.  Patient will increase Berg Balance score by > 6 points to demonstrate decreased fall risk during functional activities. Baseline: 39 on second visit 10/21: 46 Goal status: MET   4.  Patient will reduce timed up and go to <11 seconds to reduce fall risk and demonstrate improved transfer/gait ability. Baseline: 19.88 sec 10/21:13.10 sec  Goal status: ONGOING  5.  Patient will increase 10 meter walk test to >1.18m/s as to improve gait speed for better community ambulation and to reduce fall risk. Baseline: .46m/s 10/21: 1.04 m/s Goal status: MET  6.  Patient will increase six minute walk test distance by greater than 150 ft for progression to community ambulator and improve gait ability Baseline: 511 ft; 11/29/23: 954ft AMB 10/21:1220 ft  Goal status: MET 7.  Patient will report no falls over a 1 month period to indicate decreased fall frequency and improved safety Baseline: Pt had a fall last week  Goal status: INITIAL  8.  Patient will improve Berg balance scale to 50 points or greater to indicate improved balance and decrease risk of falls Baseline: 46 Goal status: IN PROGRESS      ASSESSMENT:  CLINICAL IMPRESSION: ***  Patient has returned from his two month cruise. Goals performed to address current state of mobility.  ***  Patient presents for recertification note this date.  Patient shows excellent progress toward all of his long-term goals meeting several of these goals and showing overall decreased risk of falls and improved functional capacity, muscular strength, and endurance.  Patient still at risk of falls and is to making progress toward his goals and would benefit from continued  physical therapy.  Patient does have 51-month long cruise coming up where he will miss physical therapy interventions during that time.  Patient instructed to continue with exercise and activities during this time was instructed in ways he can perform these. Pt will continue to benefit from skilled physical therapy intervention to address impairments, improve QOL, and attain therapy goals. Patient's condition has the potential to improve in response to therapy. Maximum improvement is yet to be obtained. The anticipated improvement is attainable and reasonable in a generally predictable time.     OBJECTIVE IMPAIRMENTS: Abnormal gait, decreased activity tolerance, decreased balance, decreased endurance, decreased mobility, difficulty walking, decreased strength, and pain.   ACTIVITY LIMITATIONS: standing, squatting, stairs, transfers, and locomotion level  PARTICIPATION LIMITATIONS: shopping, community activity, and yard work  PERSONAL FACTORS: Age, Behavior pattern, Time since onset of injury/illness/exacerbation, and 3+ comorbidities: DM, HTN, OA, HLD, OSA, R knee pain are also affecting patient's functional outcome.   REHAB POTENTIAL: Good  CLINICAL DECISION MAKING: Evolving/moderate complexity  EVALUATION COMPLEXITY: Moderate  PLAN:  PT FREQUENCY: 2x/week PT DURATION: 12 weeks PLANNED INTERVENTIONS: 97750- Physical Performance Testing, 97110-Therapeutic exercises, 97530- Therapeutic activity, [id number]- Neuromuscular re-education, 97535- Self Care, [id number]- Manual therapy, [id number]- Gait training, Balance training, Stair training, Joint mobilization, Cryotherapy, and Moist heat   PLAN FOR NEXT SESSION:  Introduce more ankle exercises. Continues with balance exercises Monitor symptoms with more strenuous exercises as this seems to be the reasoning behind his symptoms Endurance training and walking across uneven surfaces   Evani Shrider  Leopoldo PT  Physical Therapist- [facility]   02/25/2024, 12:38 PM   "

## 2024-02-25 NOTE — Telephone Encounter (Signed)
 Patient called due to no show. Patient's wife answered phone, reports they are not aware of PT appointments and are not coming back. Will discharge patient.  Dana Debo  Leopoldo, PT, DPT Physical Therapist - [facility]  Outpatient Physical Therapy- [facility] [phone]

## 2024-03-03 ENCOUNTER — Ambulatory Visit: Admitting: Physical Therapy

## 2024-03-05 ENCOUNTER — Ambulatory Visit: Admitting: Physical Therapy

## 2024-03-10 ENCOUNTER — Ambulatory Visit: Admitting: Physical Therapy

## 2024-03-12 ENCOUNTER — Ambulatory Visit: Admitting: Physical Therapy

## 2024-03-17 ENCOUNTER — Ambulatory Visit: Admitting: Physical Therapy

## 2024-03-19 ENCOUNTER — Ambulatory Visit: Admitting: Physical Therapy

## 2024-03-24 ENCOUNTER — Ambulatory Visit: Admitting: Physical Therapy

## 2024-03-26 ENCOUNTER — Ambulatory Visit: Admitting: Physical Therapy

## 2024-03-31 ENCOUNTER — Ambulatory Visit: Admitting: Physical Therapy

## 2024-04-02 ENCOUNTER — Ambulatory Visit: Admitting: Physical Therapy

## 2024-04-07 ENCOUNTER — Ambulatory Visit: Admitting: Physical Therapy

## 2024-04-09 ENCOUNTER — Ambulatory Visit: Admitting: Physical Therapy

## 2024-04-14 ENCOUNTER — Ambulatory Visit: Admitting: Physical Therapy

## 2024-04-16 ENCOUNTER — Ambulatory Visit: Admitting: Physical Therapy

## 2024-04-21 ENCOUNTER — Ambulatory Visit: Admitting: Physical Therapy

## 2024-04-23 ENCOUNTER — Ambulatory Visit: Admitting: Physical Therapy

## 2024-04-28 ENCOUNTER — Ambulatory Visit: Admitting: Physical Therapy

## 2024-04-30 ENCOUNTER — Ambulatory Visit: Admitting: Physical Therapy

## 2024-05-05 ENCOUNTER — Ambulatory Visit: Admitting: Physical Therapy

## 2024-05-07 ENCOUNTER — Ambulatory Visit: Admitting: Physical Therapy

## 2024-09-03 ENCOUNTER — Ambulatory Visit: Admitting: Dermatology
